# Patient Record
Sex: Female | Born: 1935 | Race: White | Hispanic: No
Health system: Midwestern US, Community
[De-identification: ages and names within clinical notes are randomized; demographics above are authoritative.]

## PROBLEM LIST (undated history)

## (undated) DIAGNOSIS — H9190 Unspecified hearing loss, unspecified ear: Secondary | ICD-10-CM

## (undated) DIAGNOSIS — N390 Urinary tract infection, site not specified: Secondary | ICD-10-CM

## (undated) DIAGNOSIS — I729 Aneurysm of unspecified site: Secondary | ICD-10-CM

## (undated) DIAGNOSIS — E785 Hyperlipidemia, unspecified: Secondary | ICD-10-CM

## (undated) DIAGNOSIS — M1990 Unspecified osteoarthritis, unspecified site: Secondary | ICD-10-CM

## (undated) DIAGNOSIS — N3090 Cystitis, unspecified without hematuria: Secondary | ICD-10-CM

## (undated) DIAGNOSIS — R4182 Altered mental status, unspecified: Secondary | ICD-10-CM

## (undated) HISTORY — DX: Unspecified hearing loss, unspecified ear: H91.90

## (undated) HISTORY — DX: Aneurysm of unspecified site: I72.9

## (undated) HISTORY — PX: ABDOMINAL HYSTERECTOMY: SHX81

---

## 1999-01-17 ENCOUNTER — Encounter: Payer: Self-pay | Admitting: *Deleted

## 1999-01-17 ENCOUNTER — Encounter: Admission: RE | Admit: 1999-01-17 | Discharge: 1999-01-17 | Payer: Self-pay | Admitting: *Deleted

## 1999-09-12 ENCOUNTER — Encounter: Admission: RE | Admit: 1999-09-12 | Discharge: 1999-12-11 | Payer: Self-pay | Admitting: *Deleted

## 2000-03-18 ENCOUNTER — Encounter: Payer: Self-pay | Admitting: *Deleted

## 2000-03-18 ENCOUNTER — Encounter: Admission: RE | Admit: 2000-03-18 | Discharge: 2000-03-18 | Payer: Self-pay | Admitting: *Deleted

## 2001-04-01 ENCOUNTER — Encounter: Admission: RE | Admit: 2001-04-01 | Discharge: 2001-04-01 | Payer: Self-pay | Admitting: *Deleted

## 2001-04-01 ENCOUNTER — Encounter: Payer: Self-pay | Admitting: *Deleted

## 2001-11-17 ENCOUNTER — Encounter: Payer: Self-pay | Admitting: Family Medicine

## 2001-11-17 ENCOUNTER — Encounter: Admission: RE | Admit: 2001-11-17 | Discharge: 2001-11-17 | Payer: Self-pay | Admitting: *Deleted

## 2002-07-27 ENCOUNTER — Other Ambulatory Visit: Admission: RE | Admit: 2002-07-27 | Discharge: 2002-07-27 | Payer: Self-pay | Admitting: Family Medicine

## 2002-08-04 ENCOUNTER — Encounter: Payer: Self-pay | Admitting: Family Medicine

## 2002-08-04 ENCOUNTER — Encounter: Admission: RE | Admit: 2002-08-04 | Discharge: 2002-08-04 | Payer: Self-pay | Admitting: Family Medicine

## 2004-03-17 ENCOUNTER — Other Ambulatory Visit: Admission: RE | Admit: 2004-03-17 | Discharge: 2004-03-17 | Payer: Self-pay | Admitting: Family Medicine

## 2004-04-11 ENCOUNTER — Encounter: Admission: RE | Admit: 2004-04-11 | Discharge: 2004-04-11 | Payer: Self-pay | Admitting: Family Medicine

## 2004-05-22 ENCOUNTER — Ambulatory Visit (HOSPITAL_COMMUNITY): Admission: RE | Admit: 2004-05-22 | Discharge: 2004-05-22 | Payer: Self-pay | Admitting: Gastroenterology

## 2005-03-28 ENCOUNTER — Other Ambulatory Visit: Admission: RE | Admit: 2005-03-28 | Discharge: 2005-03-28 | Payer: Self-pay | Admitting: Family Medicine

## 2005-05-24 ENCOUNTER — Encounter: Admission: RE | Admit: 2005-05-24 | Discharge: 2005-05-24 | Payer: Self-pay | Admitting: Family Medicine

## 2006-06-07 ENCOUNTER — Encounter: Admission: RE | Admit: 2006-06-07 | Discharge: 2006-06-07 | Payer: Self-pay | Admitting: Family Medicine

## 2007-06-13 ENCOUNTER — Encounter: Admission: RE | Admit: 2007-06-13 | Discharge: 2007-06-13 | Payer: Self-pay | Admitting: Family Medicine

## 2008-10-25 ENCOUNTER — Encounter: Admission: RE | Admit: 2008-10-25 | Discharge: 2008-10-25 | Payer: Self-pay | Admitting: Family Medicine

## 2009-12-01 ENCOUNTER — Encounter: Admission: RE | Admit: 2009-12-01 | Discharge: 2009-12-01 | Payer: Self-pay | Admitting: Family Medicine

## 2010-03-26 ENCOUNTER — Encounter: Payer: Self-pay | Admitting: Family Medicine

## 2010-07-04 ENCOUNTER — Ambulatory Visit
Admission: RE | Admit: 2010-07-04 | Discharge: 2010-07-04 | Disposition: A | Discharge status: Home / Self Care | Payer: Medicare Other | Source: Ambulatory Visit | Attending: Family Medicine | Admitting: Family Medicine

## 2010-07-04 ENCOUNTER — Other Ambulatory Visit: Payer: Self-pay | Admitting: Family Medicine

## 2010-07-04 DIAGNOSIS — R634 Abnormal weight loss: Secondary | ICD-10-CM

## 2010-07-04 DIAGNOSIS — Z87891 Personal history of nicotine dependence: Secondary | ICD-10-CM

## 2010-07-21 NOTE — Op Note (Signed)
NAME:  Sharon [name], Sharon [name] NO.:  [id number]   MEDICAL RECORD NO.:  [id number]          PATIENT TYPE:  AMB   LOCATION:  ENDO                         FACILITY:  [facility]   PHYSICIAN:  Petra Kuba, M.D.    DATE OF BIRTH:  [birth year]   DATE OF PROCEDURE:  05/22/2004  DATE OF DISCHARGE:                                 OPERATIVE REPORT   PROCEDURE:  Colonoscopy.   INDICATIONS:  Screening.  Consent was signed after risks, benefits, methods,  and options were thoroughly discussed in the office with the nurses.   PREMEDICATIONS:  Demerol 50 mg, Versed 5 mg.   DESCRIPTION OF PROCEDURE:  Rectal inspection pertinent for external  hemorrhoids, small.  Digital exam was negative.  Video pediatric adjustable  colonoscope was inserted and with abdominal pressure able to be advanced  around the colon to the cecum.  No abnormalities were seen on insertion.  The cecum was identified by the appendiceal orifice and the ileocecal valve.  The scope was slowly withdrawn.  Prep was adequate.  There was some liquid  stool that required washing and suctioning.  On slow withdrawal through the  colon, no abnormalities were seen, specifically no polyps, tumors, masses,  or diverticula.  Once back in the rectum, anorectal pull-through and  retroflexion confirmed some small hemorrhoids.  The scope was straightened  and readvanced a short ways up the left side of the colon.  Air was  suctioned and the scope removed.  The patient tolerated the procedure fair  although it seemed to be more reaction to the medicine than to the procedure  itself.  There were no obvious immediate complications.   ENDOSCOPIC DIAGNOSES:  1.  Internal and external hemorrhoids.  2.  Tortuous sigmoid not mentioned above.  3.  Otherwise within normal limits to the cecum.   PLAN:  Yearly rectals and guaiacs per Dr. Valentina Lucks.  Happy to see back p.r.n.  Consider repeat screening in five to 10 years and would consider either  Diprivan or a virtual colonoscopy at that junction.      MEM/MEDQ  D:  05/22/2004  T:  05/22/2004  Job:  [id number]   cc:   Gretta Arab. Valentina Lucks, M.D.  [address] [place]  [place] [address]  Fax: [phone]

## 2010-07-28 ENCOUNTER — Ambulatory Visit: Payer: Self-pay | Admitting: Internal Medicine

## 2010-08-03 ENCOUNTER — Ambulatory Visit: Payer: Self-pay | Admitting: Internal Medicine

## 2010-08-09 ENCOUNTER — Ambulatory Visit: Payer: Medicare Other | Admitting: Internal Medicine

## 2010-08-11 ENCOUNTER — Ambulatory Visit: Payer: Medicare Other | Admitting: Internal Medicine

## 2011-05-28 DIAGNOSIS — M1710 Unilateral primary osteoarthritis, unspecified knee: Secondary | ICD-10-CM | POA: Diagnosis not present

## 2011-06-27 DIAGNOSIS — F411 Generalized anxiety disorder: Secondary | ICD-10-CM | POA: Diagnosis not present

## 2011-06-27 DIAGNOSIS — E7800 Pure hypercholesterolemia, unspecified: Secondary | ICD-10-CM | POA: Diagnosis not present

## 2011-07-23 DIAGNOSIS — Z961 Presence of intraocular lens: Secondary | ICD-10-CM | POA: Diagnosis not present

## 2011-08-02 DIAGNOSIS — M810 Age-related osteoporosis without current pathological fracture: Secondary | ICD-10-CM | POA: Diagnosis not present

## 2011-10-17 DIAGNOSIS — H6120 Impacted cerumen, unspecified ear: Secondary | ICD-10-CM | POA: Diagnosis not present

## 2011-10-19 DIAGNOSIS — H6120 Impacted cerumen, unspecified ear: Secondary | ICD-10-CM | POA: Diagnosis not present

## 2011-11-14 DIAGNOSIS — M25569 Pain in unspecified knee: Secondary | ICD-10-CM | POA: Diagnosis not present

## 2011-11-18 DIAGNOSIS — Z23 Encounter for immunization: Secondary | ICD-10-CM | POA: Diagnosis not present

## 2011-11-22 DIAGNOSIS — M79609 Pain in unspecified limb: Secondary | ICD-10-CM | POA: Diagnosis not present

## 2011-12-14 DIAGNOSIS — Q7290 Unspecified reduction defect of unspecified lower limb: Secondary | ICD-10-CM | POA: Diagnosis not present

## 2011-12-24 DIAGNOSIS — M1710 Unilateral primary osteoarthritis, unspecified knee: Secondary | ICD-10-CM | POA: Diagnosis not present

## 2012-01-02 DIAGNOSIS — M1710 Unilateral primary osteoarthritis, unspecified knee: Secondary | ICD-10-CM | POA: Diagnosis not present

## 2012-01-07 DIAGNOSIS — H903 Sensorineural hearing loss, bilateral: Secondary | ICD-10-CM | POA: Diagnosis not present

## 2012-01-09 DIAGNOSIS — M1710 Unilateral primary osteoarthritis, unspecified knee: Secondary | ICD-10-CM | POA: Diagnosis not present

## 2012-01-15 ENCOUNTER — Other Ambulatory Visit: Payer: Self-pay | Admitting: Family Medicine

## 2012-01-15 DIAGNOSIS — F411 Generalized anxiety disorder: Secondary | ICD-10-CM | POA: Diagnosis not present

## 2012-01-15 DIAGNOSIS — Z1231 Encounter for screening mammogram for malignant neoplasm of breast: Secondary | ICD-10-CM

## 2012-01-15 DIAGNOSIS — Z0000 Encounter for general adult medical examination without abnormal findings: Secondary | ICD-10-CM | POA: Diagnosis not present

## 2012-01-15 DIAGNOSIS — E7800 Pure hypercholesterolemia, unspecified: Secondary | ICD-10-CM | POA: Diagnosis not present

## 2012-01-15 DIAGNOSIS — M899 Disorder of bone, unspecified: Secondary | ICD-10-CM | POA: Diagnosis not present

## 2012-01-15 DIAGNOSIS — Z23 Encounter for immunization: Secondary | ICD-10-CM | POA: Diagnosis not present

## 2012-01-30 DIAGNOSIS — M810 Age-related osteoporosis without current pathological fracture: Secondary | ICD-10-CM | POA: Diagnosis not present

## 2012-03-06 ENCOUNTER — Ambulatory Visit
Admission: RE | Admit: 2012-03-06 | Discharge: 2012-03-06 | Disposition: A | Discharge status: Home / Self Care | Payer: Medicare Other | Source: Ambulatory Visit | Attending: Family Medicine | Admitting: Family Medicine

## 2012-03-06 DIAGNOSIS — Z1231 Encounter for screening mammogram for malignant neoplasm of breast: Secondary | ICD-10-CM

## 2012-03-21 DIAGNOSIS — M1710 Unilateral primary osteoarthritis, unspecified knee: Secondary | ICD-10-CM | POA: Diagnosis not present

## 2012-03-31 DIAGNOSIS — M949 Disorder of cartilage, unspecified: Secondary | ICD-10-CM | POA: Diagnosis not present

## 2012-07-08 DIAGNOSIS — E7800 Pure hypercholesterolemia, unspecified: Secondary | ICD-10-CM | POA: Diagnosis not present

## 2012-07-24 DIAGNOSIS — Z961 Presence of intraocular lens: Secondary | ICD-10-CM | POA: Diagnosis not present

## 2012-08-05 DIAGNOSIS — M899 Disorder of bone, unspecified: Secondary | ICD-10-CM | POA: Diagnosis not present

## 2012-08-05 DIAGNOSIS — M949 Disorder of cartilage, unspecified: Secondary | ICD-10-CM | POA: Diagnosis not present

## 2012-10-09 DIAGNOSIS — M47817 Spondylosis without myelopathy or radiculopathy, lumbosacral region: Secondary | ICD-10-CM | POA: Diagnosis not present

## 2012-10-09 DIAGNOSIS — M1710 Unilateral primary osteoarthritis, unspecified knee: Secondary | ICD-10-CM | POA: Diagnosis not present

## 2012-11-22 DIAGNOSIS — Z23 Encounter for immunization: Secondary | ICD-10-CM | POA: Diagnosis not present

## 2013-01-16 DIAGNOSIS — F411 Generalized anxiety disorder: Secondary | ICD-10-CM | POA: Diagnosis not present

## 2013-01-16 DIAGNOSIS — E7800 Pure hypercholesterolemia, unspecified: Secondary | ICD-10-CM | POA: Diagnosis not present

## 2013-01-16 DIAGNOSIS — M899 Disorder of bone, unspecified: Secondary | ICD-10-CM | POA: Diagnosis not present

## 2013-01-16 DIAGNOSIS — Z23 Encounter for immunization: Secondary | ICD-10-CM | POA: Diagnosis not present

## 2013-01-16 DIAGNOSIS — Z0000 Encounter for general adult medical examination without abnormal findings: Secondary | ICD-10-CM | POA: Diagnosis not present

## 2013-01-28 DIAGNOSIS — L821 Other seborrheic keratosis: Secondary | ICD-10-CM | POA: Diagnosis not present

## 2013-01-28 DIAGNOSIS — L570 Actinic keratosis: Secondary | ICD-10-CM | POA: Diagnosis not present

## 2013-01-28 DIAGNOSIS — D692 Other nonthrombocytopenic purpura: Secondary | ICD-10-CM | POA: Diagnosis not present

## 2013-02-06 DIAGNOSIS — M899 Disorder of bone, unspecified: Secondary | ICD-10-CM | POA: Diagnosis not present

## 2013-03-11 ENCOUNTER — Other Ambulatory Visit: Payer: Self-pay

## 2013-03-11 DIAGNOSIS — Z1231 Encounter for screening mammogram for malignant neoplasm of breast: Secondary | ICD-10-CM

## 2013-04-01 ENCOUNTER — Ambulatory Visit: Payer: Medicare Other

## 2013-04-17 ENCOUNTER — Ambulatory Visit: Payer: Medicare Other

## 2013-04-20 ENCOUNTER — Ambulatory Visit
Admission: RE | Admit: 2013-04-20 | Discharge: 2013-04-20 | Disposition: A | Discharge status: Home / Self Care | Payer: PRIVATE HEALTH INSURANCE | Source: Ambulatory Visit

## 2013-04-20 DIAGNOSIS — Z1231 Encounter for screening mammogram for malignant neoplasm of breast: Secondary | ICD-10-CM | POA: Diagnosis not present

## 2013-08-10 DIAGNOSIS — M810 Age-related osteoporosis without current pathological fracture: Secondary | ICD-10-CM | POA: Diagnosis not present

## 2013-09-07 DIAGNOSIS — Z961 Presence of intraocular lens: Secondary | ICD-10-CM | POA: Diagnosis not present

## 2013-10-11 ENCOUNTER — Emergency Department (HOSPITAL_COMMUNITY)
Admission: EM | Admit: 2013-10-11 | Discharge: 2013-10-11 | Disposition: A | Discharge status: Home / Self Care | Payer: Medicare Other | Attending: Emergency Medicine | Admitting: Emergency Medicine

## 2013-10-11 ENCOUNTER — Encounter (HOSPITAL_COMMUNITY): Payer: Self-pay | Admitting: Emergency Medicine

## 2013-10-11 DIAGNOSIS — Z791 Long term (current) use of non-steroidal anti-inflammatories (NSAID): Secondary | ICD-10-CM | POA: Diagnosis not present

## 2013-10-11 DIAGNOSIS — F17200 Nicotine dependence, unspecified, uncomplicated: Secondary | ICD-10-CM | POA: Diagnosis not present

## 2013-10-11 DIAGNOSIS — M549 Dorsalgia, unspecified: Secondary | ICD-10-CM | POA: Diagnosis not present

## 2013-10-11 DIAGNOSIS — Z7982 Long term (current) use of aspirin: Secondary | ICD-10-CM | POA: Diagnosis not present

## 2013-10-11 DIAGNOSIS — M25559 Pain in unspecified hip: Secondary | ICD-10-CM | POA: Insufficient documentation

## 2013-10-11 DIAGNOSIS — L539 Erythematous condition, unspecified: Secondary | ICD-10-CM | POA: Diagnosis not present

## 2013-10-11 DIAGNOSIS — R112 Nausea with vomiting, unspecified: Secondary | ICD-10-CM | POA: Insufficient documentation

## 2013-10-11 DIAGNOSIS — M7918 Myalgia, other site: Secondary | ICD-10-CM

## 2013-10-11 DIAGNOSIS — Z79899 Other long term (current) drug therapy: Secondary | ICD-10-CM | POA: Insufficient documentation

## 2013-10-11 LAB — I-STAT CHEM 8, ED
BUN: 13 mg/dL (ref 6–23)
Calcium, Ion: 1.16 mmol/L (ref 1.13–1.30)
Chloride: 103 mEq/L (ref 96–112)
Creatinine, Ser: 0.8 mg/dL (ref 0.50–1.10)
Glucose, Bld: 102 mg/dL — ABNORMAL HIGH (ref 70–99)
HCT: 44 % (ref 36.0–46.0)
HEMOGLOBIN: 15 g/dL (ref 12.0–15.0)
POTASSIUM: 4.4 meq/L (ref 3.7–5.3)
SODIUM: 136 meq/L — AB (ref 137–147)
TCO2: 26 mmol/L (ref 0–100)

## 2013-10-11 MED ORDER — OXYCODONE-ACETAMINOPHEN 5-325 MG PO TABS
1.0000 | ORAL_TABLET | Freq: Once | ORAL | Status: AC
  Administered 2013-10-11: 1 via ORAL
  Filled 2013-10-11: qty 1

## 2013-10-11 MED ORDER — ONDANSETRON 4 MG PO TBDP
4.0000 mg | ORAL_TABLET | Freq: Three times a day (TID) | ORAL | Status: DC | PRN
Start: 2013-10-11 — End: 2014-08-16

## 2013-10-11 MED ORDER — ONDANSETRON 4 MG PO TBDP
8.0000 mg | ORAL_TABLET | Freq: Once | ORAL | Status: AC
  Administered 2013-10-11: 8 mg via ORAL
  Filled 2013-10-11: qty 2

## 2013-10-11 MED ORDER — ONDANSETRON HCL 4 MG/2ML IJ SOLN: 4.0000 mg | Freq: Once | INTRAMUSCULAR | Status: DC

## 2013-10-11 NOTE — ED Notes (Signed)
Pt with acute onset R buttock pain at 7 am.  Pt took aleve and became nauseated.  Pain continued, so she took an ibuprofen about 5 hours later and vomited, so she took another one and kept it down.  Pt does have non-raised circular red mark to R buttock and appears quite uncomfortable.

## 2013-10-11 NOTE — ED Provider Notes (Signed)
CSN: [id number]     Arrival date & time 10/11/13  1424 History   First MD Initiated Contact with Patient 10/11/13 1604     Chief Complaint  Patient presents with  . Nausea  . Back Pain   RAILEIGH SABATER is a 79 yo caucasian F w/PMH of arthritis who presents today w/severe right hip pain. Pt awoke this morning around 7AM w/pain in her right buttox and noticed a red area there. Pain is sharp and throbbing. 10/10 in severity. Minor radiation to right leg/right upper abd. Never had this before. Denies any recent bug bites or scratches. Pt also felt nauseous today after taking aleve this morning and again this afternoon after taking motrin.   She denies CP, SOB, fever, chills, abd pain, constipation, hematemesis, dysuria, hematuria, sick contacts, or recent travel.   (Consider location/radiation/quality/duration/timing/severity/associated sxs/prior   Treatment) Patient is a 79 y.o. female presenting with back pain.  Back Pain Pain location: right buttox/hip. Quality:  Aching Radiates to: right upper leg, right upper abd. Pain severity:  Severe Onset quality:  Sudden Duration:  1 day Timing:  Constant Progression:  Resolved Chronicity:  New Context: not falling, not recent illness, not recent injury and not twisting   Relieved by:  Nothing Exacerbated by: touching the area. Associated symptoms: no abdominal pain, no chest pain, no dysuria, no fever, no headaches and no pelvic pain     History reviewed. No pertinent past medical history. Past Surgical History  Procedure Laterality Date  . Abdominal hysterectomy     No family history on file. History  Substance Use Topics  . Smoking status: Current Every Day Smoker -- 0.50 packs/day    Types: Cigarettes  . Smokeless tobacco: Not on file  . Alcohol Use: No     Comment: recovering alcoholic   OB History   Grav Para Term Preterm Abortions TAB SAB Ect Mult Living                 Review of Systems  Constitutional: Negative  for fever and chills.  Respiratory: Negative for shortness of breath.   Cardiovascular: Negative for chest pain, palpitations and leg swelling.  Gastrointestinal: Positive for nausea and vomiting. Negative for abdominal pain, diarrhea, constipation and abdominal distention.  Genitourinary: Negative for dysuria, frequency, hematuria, flank pain, decreased urine volume and pelvic pain.  Musculoskeletal: Negative for back pain, joint swelling and neck stiffness.  Skin: Positive for color change (redness over right buttox). Negative for wound.  Neurological: Negative for dizziness, speech difficulty, light-headedness and headaches.  All other systems reviewed and are negative.     Allergies  Review of patient's allergies indicates no known allergies.  Home Medications   Prior to Admission medications   Medication Sig Start Date End Date Taking? Authorizing Provider  aspirin EC 81 MG tablet Take 81 mg by mouth daily.   Yes Historical Provider, MD  ibuprofen (ADVIL,MOTRIN) 200 MG tablet Take 200 mg by mouth every 6 (six) hours as needed.   Yes Historical Provider, MD  naproxen sodium (ANAPROX) 220 MG tablet Take 220 mg by mouth 2 (two) times daily with a meal.   Yes Historical Provider, MD  Omega-3 Fatty Acids (FISH OIL PO) Take 1 tablet by mouth daily.   Yes Historical Provider, MD  Probiotic Product (HEALTHY COLON PO) Take 1 tablet by mouth daily.   Yes Historical Provider, MD  simvastatin (ZOCOR) 40 MG tablet Take 40 mg by mouth daily.   Yes Historical Provider, MD  vitamin E 100 UNIT capsule Take 100 Units by mouth daily.   Yes Historical Provider, MD  ondansetron (ZOFRAN ODT) 4 MG disintegrating tablet Take 1 tablet (4 mg total) by mouth every 8 (eight) hours as needed for nausea or vomiting. 10/11/13   Sherian Maroon, MD   BP 137/118  Pulse 65  Temp(Src) 98 F (36.7 C) (Oral)  Resp 18  Ht 5\' 3"  (1.6 m)  Wt 127 lb (57.607 kg)  BMI 22.50 kg/m2  SpO2 97% Physical Exam  Nursing note and  vitals reviewed. Constitutional: She is oriented to person, place, and time. She appears well-developed and well-nourished. No distress.  HENT:  Head: Normocephalic and atraumatic.  Cardiovascular: Normal rate, regular rhythm, normal heart sounds and intact distal pulses.  Exam reveals no gallop and no friction rub.   No murmur heard. Pulmonary/Chest: Effort normal and breath sounds normal. No respiratory distress. She has no wheezes. She has no rales. She exhibits no tenderness.  Abdominal: Soft. Bowel sounds are normal. She exhibits no distension and no mass. There is no tenderness. There is no rebound and no guarding.  Lymphadenopathy:    She has no cervical adenopathy.  Neurological: She is alert and oriented to person, place, and time.  Skin: Skin is warm and dry. She is not diaphoretic. There is erythema (small wheels of erythema over right hip, nontender, not ithcing).    ED Course  Procedures (including critical care time) Labs Review Labs Reviewed  I-STAT CHEM 8, ED - Abnormal; Notable for the following:    Sodium 136 (*)    Glucose, Bld 102 (*)    All other components within normal limits    Imaging Review No results found.   EKG Interpretation None      MDM   79 year old Caucasian female here with right buttocks pain. Please see history of present illness for details. On exam patient in NAD, AF VSS. After Percocet given in triage patient's pain is come down to 0 from a 10/10. She indicates that there was redness and tenderness over her right buttox. On my exam there is no redness over her buttox. There are 2 small red marks on her right lateral hip. These are nonpainful, and nonfluctuant, do not appear infectious. No obvious bite mark. Possible allergic reaction to something? No sign of cellulitis or abscess. LEs w/full strength, sensation, and ROM. No tenderness over spine or SI joint.   Patient has no abdominal tenderness on exam. Nausea completely resolved with  Zofran. No urinary sx. Possibly medication induced nausea. Will check Chem8.   Electrolytes wnl. Pt asymptomatic here. DC home w/strict return precautions including severe pain, return of redness, especially red streaking, PO intolerance and severe N/V, or abd pain. Follow up to PCP as needed.   Final diagnoses:  Right buttock pain    Pt was seen under the supervision of Dr. Aline Brochure.     Sherian Maroon, MD 10/11/13 [phone]

## 2013-10-11 NOTE — Discharge Instructions (Signed)
Take motrin for pain 400-600mg  every 8 hours as needed. Do not take on empty stomach. If redness and pain worsen, seek medical care.

## 2013-10-11 NOTE — ED Provider Notes (Signed)
Medical screening examination/treatment/procedure(s) were conducted as a shared visit with resident physician and myself.  I personally evaluated the patient during the encounter.  I interviewed and examined the patient. Lungs are CTAB. Cardiac exam wnl. Abdomen soft.  A few small urticarial lesions over the right hip. Does not appear cellulitic. I suspect her pain is msk in nature. istat chem 8 non-contrib. Pt otherwise well appearing and pain controlled.   Pamella Pert, MD 10/11/13 2330

## 2013-10-20 DIAGNOSIS — K5900 Constipation, unspecified: Secondary | ICD-10-CM | POA: Diagnosis not present

## 2013-10-20 DIAGNOSIS — M5430 Sciatica, unspecified side: Secondary | ICD-10-CM | POA: Diagnosis not present

## 2013-10-20 DIAGNOSIS — B009 Herpesviral infection, unspecified: Secondary | ICD-10-CM | POA: Diagnosis not present

## 2013-10-30 DIAGNOSIS — M5137 Other intervertebral disc degeneration, lumbosacral region: Secondary | ICD-10-CM | POA: Diagnosis not present

## 2013-10-30 DIAGNOSIS — M999 Biomechanical lesion, unspecified: Secondary | ICD-10-CM | POA: Diagnosis not present

## 2013-10-30 DIAGNOSIS — M5430 Sciatica, unspecified side: Secondary | ICD-10-CM | POA: Diagnosis not present

## 2013-11-02 DIAGNOSIS — M999 Biomechanical lesion, unspecified: Secondary | ICD-10-CM | POA: Diagnosis not present

## 2013-11-02 DIAGNOSIS — M5137 Other intervertebral disc degeneration, lumbosacral region: Secondary | ICD-10-CM | POA: Diagnosis not present

## 2013-11-02 DIAGNOSIS — M5430 Sciatica, unspecified side: Secondary | ICD-10-CM | POA: Diagnosis not present

## 2013-11-04 DIAGNOSIS — M5137 Other intervertebral disc degeneration, lumbosacral region: Secondary | ICD-10-CM | POA: Diagnosis not present

## 2013-11-04 DIAGNOSIS — M999 Biomechanical lesion, unspecified: Secondary | ICD-10-CM | POA: Diagnosis not present

## 2013-11-04 DIAGNOSIS — M5430 Sciatica, unspecified side: Secondary | ICD-10-CM | POA: Diagnosis not present

## 2013-11-05 DIAGNOSIS — M5137 Other intervertebral disc degeneration, lumbosacral region: Secondary | ICD-10-CM | POA: Diagnosis not present

## 2013-11-05 DIAGNOSIS — M5430 Sciatica, unspecified side: Secondary | ICD-10-CM | POA: Diagnosis not present

## 2013-11-05 DIAGNOSIS — M999 Biomechanical lesion, unspecified: Secondary | ICD-10-CM | POA: Diagnosis not present

## 2013-11-11 DIAGNOSIS — M5137 Other intervertebral disc degeneration, lumbosacral region: Secondary | ICD-10-CM | POA: Diagnosis not present

## 2013-11-11 DIAGNOSIS — M999 Biomechanical lesion, unspecified: Secondary | ICD-10-CM | POA: Diagnosis not present

## 2013-11-11 DIAGNOSIS — M5430 Sciatica, unspecified side: Secondary | ICD-10-CM | POA: Diagnosis not present

## 2013-11-13 DIAGNOSIS — M5137 Other intervertebral disc degeneration, lumbosacral region: Secondary | ICD-10-CM | POA: Diagnosis not present

## 2013-11-13 DIAGNOSIS — M5430 Sciatica, unspecified side: Secondary | ICD-10-CM | POA: Diagnosis not present

## 2013-11-13 DIAGNOSIS — M999 Biomechanical lesion, unspecified: Secondary | ICD-10-CM | POA: Diagnosis not present

## 2013-11-16 DIAGNOSIS — M5430 Sciatica, unspecified side: Secondary | ICD-10-CM | POA: Diagnosis not present

## 2013-11-16 DIAGNOSIS — M999 Biomechanical lesion, unspecified: Secondary | ICD-10-CM | POA: Diagnosis not present

## 2013-11-16 DIAGNOSIS — M5137 Other intervertebral disc degeneration, lumbosacral region: Secondary | ICD-10-CM | POA: Diagnosis not present

## 2013-11-18 DIAGNOSIS — M5430 Sciatica, unspecified side: Secondary | ICD-10-CM | POA: Diagnosis not present

## 2013-11-18 DIAGNOSIS — M999 Biomechanical lesion, unspecified: Secondary | ICD-10-CM | POA: Diagnosis not present

## 2013-11-18 DIAGNOSIS — M5137 Other intervertebral disc degeneration, lumbosacral region: Secondary | ICD-10-CM | POA: Diagnosis not present

## 2013-11-20 DIAGNOSIS — M5430 Sciatica, unspecified side: Secondary | ICD-10-CM | POA: Diagnosis not present

## 2013-11-20 DIAGNOSIS — M5137 Other intervertebral disc degeneration, lumbosacral region: Secondary | ICD-10-CM | POA: Diagnosis not present

## 2013-11-20 DIAGNOSIS — M999 Biomechanical lesion, unspecified: Secondary | ICD-10-CM | POA: Diagnosis not present

## 2013-11-23 DIAGNOSIS — M999 Biomechanical lesion, unspecified: Secondary | ICD-10-CM | POA: Diagnosis not present

## 2013-11-23 DIAGNOSIS — M5430 Sciatica, unspecified side: Secondary | ICD-10-CM | POA: Diagnosis not present

## 2013-11-23 DIAGNOSIS — M5137 Other intervertebral disc degeneration, lumbosacral region: Secondary | ICD-10-CM | POA: Diagnosis not present

## 2013-11-25 DIAGNOSIS — M999 Biomechanical lesion, unspecified: Secondary | ICD-10-CM | POA: Diagnosis not present

## 2013-11-25 DIAGNOSIS — M5430 Sciatica, unspecified side: Secondary | ICD-10-CM | POA: Diagnosis not present

## 2013-11-25 DIAGNOSIS — M5137 Other intervertebral disc degeneration, lumbosacral region: Secondary | ICD-10-CM | POA: Diagnosis not present

## 2013-11-26 DIAGNOSIS — M5430 Sciatica, unspecified side: Secondary | ICD-10-CM | POA: Diagnosis not present

## 2013-11-26 DIAGNOSIS — M999 Biomechanical lesion, unspecified: Secondary | ICD-10-CM | POA: Diagnosis not present

## 2013-11-26 DIAGNOSIS — M5137 Other intervertebral disc degeneration, lumbosacral region: Secondary | ICD-10-CM | POA: Diagnosis not present

## 2013-11-30 DIAGNOSIS — M999 Biomechanical lesion, unspecified: Secondary | ICD-10-CM | POA: Diagnosis not present

## 2013-11-30 DIAGNOSIS — M5430 Sciatica, unspecified side: Secondary | ICD-10-CM | POA: Diagnosis not present

## 2013-11-30 DIAGNOSIS — M5137 Other intervertebral disc degeneration, lumbosacral region: Secondary | ICD-10-CM | POA: Diagnosis not present

## 2013-12-02 DIAGNOSIS — Z23 Encounter for immunization: Secondary | ICD-10-CM | POA: Diagnosis not present

## 2014-02-10 DIAGNOSIS — M810 Age-related osteoporosis without current pathological fracture: Secondary | ICD-10-CM | POA: Diagnosis not present

## 2014-02-16 DIAGNOSIS — L570 Actinic keratosis: Secondary | ICD-10-CM | POA: Diagnosis not present

## 2014-02-16 DIAGNOSIS — L821 Other seborrheic keratosis: Secondary | ICD-10-CM | POA: Diagnosis not present

## 2014-02-16 DIAGNOSIS — L738 Other specified follicular disorders: Secondary | ICD-10-CM | POA: Diagnosis not present

## 2014-03-01 DIAGNOSIS — R51 Headache: Secondary | ICD-10-CM | POA: Diagnosis not present

## 2014-03-01 DIAGNOSIS — R400 Somnolence: Secondary | ICD-10-CM | POA: Diagnosis not present

## 2014-03-02 ENCOUNTER — Emergency Department (HOSPITAL_COMMUNITY): Payer: Medicare Other

## 2014-03-02 ENCOUNTER — Observation Stay (HOSPITAL_COMMUNITY): Payer: Medicare Other

## 2014-03-02 ENCOUNTER — Encounter (HOSPITAL_COMMUNITY): Payer: Self-pay | Admitting: Emergency Medicine

## 2014-03-02 ENCOUNTER — Inpatient Hospital Stay (HOSPITAL_COMMUNITY)
Admission: EM | Admit: 2014-03-02 | Discharge: 2014-03-18 | DRG: 020 | Disposition: A | Discharge status: Rehabilitation Facility | Payer: Medicare Other | Attending: Internal Medicine | Admitting: Internal Medicine

## 2014-03-02 DIAGNOSIS — N3090 Cystitis, unspecified without hematuria: Secondary | ICD-10-CM | POA: Insufficient documentation

## 2014-03-02 DIAGNOSIS — J9601 Acute respiratory failure with hypoxia: Secondary | ICD-10-CM

## 2014-03-02 DIAGNOSIS — I671 Cerebral aneurysm, nonruptured: Secondary | ICD-10-CM | POA: Insufficient documentation

## 2014-03-02 DIAGNOSIS — R059 Cough, unspecified: Secondary | ICD-10-CM

## 2014-03-02 DIAGNOSIS — R4701 Aphasia: Secondary | ICD-10-CM | POA: Diagnosis present

## 2014-03-02 DIAGNOSIS — Z7982 Long term (current) use of aspirin: Secondary | ICD-10-CM

## 2014-03-02 DIAGNOSIS — Z791 Long term (current) use of non-steroidal anti-inflammatories (NSAID): Secondary | ICD-10-CM

## 2014-03-02 DIAGNOSIS — R05 Cough: Secondary | ICD-10-CM

## 2014-03-02 DIAGNOSIS — E876 Hypokalemia: Secondary | ICD-10-CM | POA: Diagnosis not present

## 2014-03-02 DIAGNOSIS — R1310 Dysphagia, unspecified: Secondary | ICD-10-CM | POA: Diagnosis present

## 2014-03-02 DIAGNOSIS — N390 Urinary tract infection, site not specified: Secondary | ICD-10-CM | POA: Diagnosis present

## 2014-03-02 DIAGNOSIS — R51 Headache: Secondary | ICD-10-CM

## 2014-03-02 DIAGNOSIS — R410 Disorientation, unspecified: Secondary | ICD-10-CM | POA: Diagnosis not present

## 2014-03-02 DIAGNOSIS — E785 Hyperlipidemia, unspecified: Secondary | ICD-10-CM | POA: Diagnosis present

## 2014-03-02 DIAGNOSIS — I878 Other specified disorders of veins: Secondary | ICD-10-CM

## 2014-03-02 DIAGNOSIS — I729 Aneurysm of unspecified site: Secondary | ICD-10-CM

## 2014-03-02 DIAGNOSIS — R4182 Altered mental status, unspecified: Secondary | ICD-10-CM | POA: Diagnosis not present

## 2014-03-02 DIAGNOSIS — G441 Vascular headache, not elsewhere classified: Secondary | ICD-10-CM

## 2014-03-02 DIAGNOSIS — G9340 Encephalopathy, unspecified: Secondary | ICD-10-CM

## 2014-03-02 DIAGNOSIS — Z4659 Encounter for fitting and adjustment of other gastrointestinal appliance and device: Secondary | ICD-10-CM

## 2014-03-02 DIAGNOSIS — I608 Other nontraumatic subarachnoid hemorrhage: Secondary | ICD-10-CM

## 2014-03-02 DIAGNOSIS — R451 Restlessness and agitation: Secondary | ICD-10-CM | POA: Diagnosis not present

## 2014-03-02 DIAGNOSIS — R519 Headache, unspecified: Secondary | ICD-10-CM | POA: Diagnosis present

## 2014-03-02 DIAGNOSIS — J9600 Acute respiratory failure, unspecified whether with hypoxia or hypercapnia: Secondary | ICD-10-CM | POA: Diagnosis present

## 2014-03-02 DIAGNOSIS — G919 Hydrocephalus, unspecified: Secondary | ICD-10-CM

## 2014-03-02 DIAGNOSIS — G910 Communicating hydrocephalus: Secondary | ICD-10-CM | POA: Diagnosis not present

## 2014-03-02 DIAGNOSIS — G039 Meningitis, unspecified: Secondary | ICD-10-CM | POA: Diagnosis present

## 2014-03-02 DIAGNOSIS — I6031 Nontraumatic subarachnoid hemorrhage from right posterior communicating artery: Principal | ICD-10-CM | POA: Diagnosis present

## 2014-03-02 DIAGNOSIS — J95821 Acute postprocedural respiratory failure: Secondary | ICD-10-CM | POA: Diagnosis not present

## 2014-03-02 DIAGNOSIS — N3000 Acute cystitis without hematuria: Secondary | ICD-10-CM | POA: Diagnosis not present

## 2014-03-02 DIAGNOSIS — Z0189 Encounter for other specified special examinations: Secondary | ICD-10-CM

## 2014-03-02 DIAGNOSIS — I609 Nontraumatic subarachnoid hemorrhage, unspecified: Secondary | ICD-10-CM

## 2014-03-02 DIAGNOSIS — F17210 Nicotine dependence, cigarettes, uncomplicated: Secondary | ICD-10-CM | POA: Diagnosis present

## 2014-03-02 HISTORY — DX: Urinary tract infection, site not specified: N39.0

## 2014-03-02 HISTORY — DX: Cystitis, unspecified without hematuria: N30.90

## 2014-03-02 HISTORY — DX: Unspecified osteoarthritis, unspecified site: M19.90

## 2014-03-02 HISTORY — DX: Hyperlipidemia, unspecified: E78.5

## 2014-03-02 LAB — URINALYSIS, ROUTINE W REFLEX MICROSCOPIC
Bilirubin Urine: NEGATIVE
Glucose, UA: 500 mg/dL — AB
Hgb urine dipstick: NEGATIVE
KETONES UR: 15 mg/dL — AB
NITRITE: NEGATIVE
PH: 6 (ref 5.0–8.0)
Protein, ur: NEGATIVE mg/dL
SPECIFIC GRAVITY, URINE: 1.019 (ref 1.005–1.030)
Urobilinogen, UA: 1 mg/dL (ref 0.0–1.0)

## 2014-03-02 LAB — COMPREHENSIVE METABOLIC PANEL
ALBUMIN: 4 g/dL (ref 3.5–5.2)
ALT: 12 U/L (ref 0–35)
AST: 21 U/L (ref 0–37)
Alkaline Phosphatase: 51 U/L (ref 39–117)
Anion gap: 9 (ref 5–15)
BUN: 17 mg/dL (ref 6–23)
CALCIUM: 9.2 mg/dL (ref 8.4–10.5)
CO2: 26 mmol/L (ref 19–32)
Chloride: 100 mEq/L (ref 96–112)
Creatinine, Ser: 0.75 mg/dL (ref 0.50–1.10)
GFR calc Af Amer: >90 mL/min (ref >90)
GFR calc non Af Amer: 80 mL/min — ABNORMAL LOW (ref >90)
Glucose, Bld: 100 mg/dL — ABNORMAL HIGH (ref 70–99)
Potassium: 4 mmol/L (ref 3.5–5.1)
SODIUM: 135 mmol/L (ref 135–145)
TOTAL PROTEIN: 6.8 g/dL (ref 6.0–8.3)
Total Bilirubin: 0.7 mg/dL (ref 0.3–1.2)

## 2014-03-02 LAB — CBC WITH DIFFERENTIAL/PLATELET
BASOS PCT: 0 % (ref 0–1)
Basophils Absolute: 0 10*3/uL (ref 0.0–0.1)
Eosinophils Absolute: 0.1 10*3/uL (ref 0.0–0.7)
Eosinophils Relative: 1 % (ref 0–5)
HCT: 38.1 % (ref 36.0–46.0)
HEMOGLOBIN: 12.7 g/dL (ref 12.0–15.0)
Lymphocytes Relative: 15 % (ref 12–46)
Lymphs Abs: 1.4 10*3/uL (ref 0.7–4.0)
MCH: 31.6 pg (ref 26.0–34.0)
MCHC: 33.3 g/dL (ref 30.0–36.0)
MCV: 94.8 fL (ref 78.0–100.0)
MONOS PCT: 11 % (ref 3–12)
Monocytes Absolute: 1 10*3/uL (ref 0.1–1.0)
NEUTROS ABS: 6.5 10*3/uL (ref 1.7–7.7)
NEUTROS PCT: 73 % (ref 43–77)
Platelets: 275 10*3/uL (ref 150–400)
RBC: 4.02 MIL/uL (ref 3.87–5.11)
RDW: 12.6 % (ref 11.5–15.5)
WBC: 9 10*3/uL (ref 4.0–10.5)

## 2014-03-02 LAB — TSH: TSH: 0.79 u[IU]/mL (ref 0.350–4.500)

## 2014-03-02 LAB — CBG MONITORING, ED: GLUCOSE-CAPILLARY: 111 mg/dL — AB (ref 70–99)

## 2014-03-02 LAB — URINE MICROSCOPIC-ADD ON

## 2014-03-02 LAB — I-STAT CG4 LACTIC ACID, ED: Lactic Acid, Venous: 0.67 mmol/L (ref 0.5–2.2)

## 2014-03-02 LAB — PROTIME-INR
INR: 1.01 (ref 0.00–1.49)
Prothrombin Time: 13.4 seconds (ref 11.6–15.2)

## 2014-03-02 MED ORDER — CEFTRIAXONE SODIUM IN DEXTROSE 20 MG/ML IV SOLN
1.0000 g | INTRAVENOUS | Status: DC
  Filled 2014-03-02: qty 50

## 2014-03-02 MED ORDER — SODIUM CHLORIDE 0.9 % IV SOLN
INTRAVENOUS | Status: DC
  Administered 2014-03-02: 16:00:00 via INTRAVENOUS

## 2014-03-02 MED ORDER — ENOXAPARIN SODIUM 40 MG/0.4ML ~~LOC~~ SOLN
40.0000 mg | SUBCUTANEOUS | Status: DC
  Administered 2014-03-03: 40 mg via SUBCUTANEOUS
  Filled 2014-03-02 (×3): qty 0.4

## 2014-03-02 MED ORDER — DEXTROSE 5 % IV SOLN
1.0000 g | Freq: Once | INTRAVENOUS | Status: AC
  Administered 2014-03-02: 1 g via INTRAVENOUS
  Filled 2014-03-02: qty 10

## 2014-03-02 MED ORDER — ACETAMINOPHEN 325 MG PO TABS: 650.0000 mg | ORAL_TABLET | Freq: Four times a day (QID) | ORAL | Status: DC | PRN

## 2014-03-02 MED ORDER — ASPIRIN EC 81 MG PO TBEC
81.0000 mg | DELAYED_RELEASE_TABLET | Freq: Every day | ORAL | Status: DC
  Administered 2014-03-03: 81 mg via ORAL
  Filled 2014-03-02 (×2): qty 1

## 2014-03-02 MED ORDER — LORAZEPAM 2 MG/ML IJ SOLN
INTRAMUSCULAR | Status: AC
  Filled 2014-03-02: qty 1

## 2014-03-02 MED ORDER — SIMVASTATIN 20 MG PO TABS
20.0000 mg | ORAL_TABLET | Freq: Every day | ORAL | Status: DC
  Administered 2014-03-02 – 2014-03-17 (×12): 20 mg via ORAL
  Filled 2014-03-02 (×17): qty 1

## 2014-03-02 MED ORDER — ACETAMINOPHEN 650 MG RE SUPP
650.0000 mg | Freq: Four times a day (QID) | RECTAL | Status: DC | PRN
  Administered 2014-03-02: 650 mg via RECTAL
  Filled 2014-03-02: qty 1

## 2014-03-02 MED ORDER — LORAZEPAM 2 MG/ML IJ SOLN
1.0000 mg | Freq: Once | INTRAMUSCULAR | Status: AC
  Administered 2014-03-02 – 2014-03-03 (×2): 1 mg via INTRAVENOUS

## 2014-03-02 NOTE — H&P (Signed)
History and Physical  Sharon [name] XBJ:[id number] DOB: 1935-01-01 DOA: 03/02/2014  Referring physician: Dalia [name], ED PA PCP: Sharon Casco, MD  Outpatient Specialists:  1. Not known  Chief Complaint: Altered mental status  HPI: Sharon [name] is a 79 y.o. female with history of dyslipidemia, presented to the ED with ultra mental status. Patient is unable to provide any history secondary to altered mental status. History was obtained by discussing with patient's boyfriend Sharon [name] via phone. Patient apparently experienced an episode of headache associated with weakness during her dinner party and of November. After coming home, patient's boyfriend called a neighbor who is an oncologist who evaluated patient and indicated that she did not have a stroke clinically. However since then, patient apparently has had intermittent headache and sleeps more than usual. She however was coherent until sometime yesterday when she was slightly disoriented. He took her to the PCPs office and some lab work were done but they were not given any diagnosis. Today when her grandson went to see her, she was more confused and hence was brought to the ED. In the ED, lab work is unremarkable. Chest x-ray is negative. CT head is without acute intracranial process but shows indeterminate punctate calcification within the medial aspect of the right middle cranial foci which could potentially represent a small meningioma. No reported fever, chills, cough, dyspnea, nausea, vomiting, dysuria or urinary frequency. Hospitalist admission was requested.   Review of Systems: All systems reviewed and apart from history of presenting illness, are negative.  History reviewed. No pertinent past medical history. Past Surgical History  Procedure Laterality Date  . Abdominal hysterectomy     Social History:  reports that she has been smoking Cigarettes.  She has been smoking about 0.50 packs per day. She  does not have any smokeless tobacco history on file. She reports that she does not drink alcohol or use illicit drugs.   No Known Allergies  History reviewed. No pertinent family history.  unable to obtain.  Prior to Admission medications   Medication Sig Start Date End Date Taking? Authorizing Provider  aspirin EC 81 MG tablet Take 81 mg by mouth daily.    Historical Provider, MD  ibuprofen (ADVIL,MOTRIN) 200 MG tablet Take 200 mg by mouth every 6 (six) hours as needed.    Historical Provider, MD  naproxen sodium (ANAPROX) 220 MG tablet Take 220 mg by mouth 2 (two) times daily with a meal.    Historical Provider, MD  Omega-3 Fatty Acids (FISH OIL PO) Take 1 tablet by mouth daily.    Historical Provider, MD  ondansetron (ZOFRAN ODT) 4 MG disintegrating tablet Take 1 tablet (4 mg total) by mouth every 8 (eight) hours as needed for nausea or vomiting. 10/11/13   Sherian Maroon, MD  Probiotic Product (HEALTHY COLON PO) Take 1 tablet by mouth daily.    Historical Provider, MD  RENOVA 0.02 % CREA Apply 1 application topically daily. 01/12/14   Historical Provider, MD  simvastatin (ZOCOR) 20 MG tablet Take 1 tablet by mouth daily. 02/07/14   Historical Provider, MD  vitamin E 100 UNIT capsule Take 100 Units by mouth daily.    Historical Provider, MD   Physical Exam: Filed Vitals:   03/02/14 1330 03/02/14 1345 03/02/14 1400 03/02/14 1415  BP: 152/66 150/61 146/62 151/82  Pulse: 78 75 74 80  Temp:      TempSrc:      Resp: 16 16 17 17   Height:  Weight:      SpO2: 98% 97% 99% 98%   temperature: 98.73F.   General exam: Moderately built and nourished pleasant elderly female patient, lying comfortably supine on the gurney in no obvious distress.  Head, eyes and ENT: Nontraumatic and normocephalic. Pupils equally reacting to light and accommodation. Oral mucosa slightly dry.  Neck: Supple. No JVD, carotid bruit or thyromegaly.  Lymphatics: No lymphadenopathy.  Respiratory system: Clear to  auscultation. No increased work of breathing.  Cardiovascular system: S1 and S2 heard, RRR. No JVD, murmurs, gallops, clicks or pedal edema.  Gastrointestinal system: Abdomen is nondistended, soft and nontender. Normal bowel sounds heard. No organomegaly or masses appreciated.  Central nervous system: Alert and oriented to self and partly to place. She initially said that she was in a circus but then stated that she was in a [facility]". She is not oriented to time or president. She does follow some instructions. No focal neurological deficits.  Extremities: Symmetric 5 x 5 power. Peripheral pulses symmetrically felt.   Skin: No rashes or acute findings.  Musculoskeletal system: Negative exam.  Psychiatry: Pleasant and cooperative.   Labs on Admission:  Basic Metabolic Panel:  Recent Labs Lab 03/02/14 1048  NA 135  K 4.0  CL 100  CO2 26  GLUCOSE 100*  BUN 17  CREATININE 0.75  CALCIUM 9.2   Liver Function Tests:  Recent Labs Lab 03/02/14 1048  AST 21  ALT 12  ALKPHOS 51  BILITOT 0.7  PROT 6.8  ALBUMIN 4.0   No results for input(s): LIPASE, AMYLASE in the last 168 hours. No results for input(s): AMMONIA in the last 168 hours. CBC:  Recent Labs Lab 03/02/14 1048  WBC 9.0  NEUTROABS 6.5  HGB 12.7  HCT 38.1  MCV 94.8  PLT 275   Cardiac Enzymes: No results for input(s): CKTOTAL, CKMB, CKMBINDEX, TROPONINI in the last 168 hours.  BNP (last 3 results) No results for input(s): PROBNP in the last 8760 hours. CBG:  Recent Labs Lab 03/02/14 1042  GLUCAP 111*    Radiological Exams on Admission: Dg Chest 2 View  03/02/2014   CLINICAL DATA:  Cough; confusion  EXAM: CHEST  2 VIEW  COMPARISON:  Jul 04, 2010  FINDINGS: There is no edema or consolidation. Heart size and pulmonary vascularity are normal. No adenopathy. There is lower thoracic and upper lumbar levoscoliosis. There is degenerative change in the thoracic spine.  IMPRESSION: No edema or  consolidation.   Electronically Signed   By: Lowella Grip M.D.   On: 03/02/2014 13:03   Ct Head Wo Contrast  03/02/2014   CLINICAL DATA:  Confusion, altered mental status, temporal headache.  EXAM: CT HEAD WITHOUT CONTRAST  TECHNIQUE: Contiguous axial images were obtained from the base of the skull through the vertex without intravenous contrast.  COMPARISON:  None.  FINDINGS: Mild centralized volume loss with commensurate ex vacuo dilatation of the ventricular system. Scattered periventricular hypodensities compatible with microvascular ischemic disease. No CT evidence acute large territory infarct. No intraparenchymal or extra-axial hemorrhage.  There is a punctate (approximately 0.7 cm) calcification within the medial aspect of the right middle cranial fossa (image 7, series 202) which is incompletely evaluated though potentially representative of a small meningioma. Otherwise, no intraparenchymal or extra-axial mass. Normal configuration of the ventricles and basilar cisterns. No midline shift. Intracranial atherosclerosis.  Limited visualization of the paranasal sinuses and mastoid air cells are normal. No air-fluid levels. Post bilateral cataract surgery. Regional soft tissues appear normal.  No displaced calvarial fracture.  IMPRESSION: 1. Mild centralized volume loss and microvascular ischemic disease without acute intracranial process. 2. Indeterminate punctate calcification within the medial aspect of the right middle cranial fossa nonspecific though potentially representing a small meningioma. Further evaluation could be performed with MRI as clinically indicated.   Electronically Signed   By: Sandi Mariscal M.D.   On: 03/02/2014 12:05    EKG:  no EKG seen in Epic.  Assessment/Plan Principal Problem:   Acute encephalopathy Active Problems:   Headache   UTI (lower urinary tract infection)   1. Acute encephalopathy: Could be secondary to UTI. Patient does not appear septic, toxic or  unstable. No obvious focal neurological deficits. CT negative. We'll treat underlying UTI and monitor. Will obtain MRI brain to further determine the abnormality seen on CT head especially given history of headache ongoing for the last month. Will check TSH. 2. UTI: Continue IV Rocephin that was started in the ED, pending urine culture results. 3. Headaches: Unclear etiology. CT head without acute findings. Follow MRI brain. 4. Dyslipidemia: Continue statins.     Code Status:  Full  Family Communication:  discussed with patient's boyfriend Sharon [name] Disposition Plan:  home when medically stable.   Time spent:  6 minutes  Kyro Joswick, MD, FACP, FHM. [facility] Pager [phone]  If 7PM-7AM, please contact night-coverage [email or web address] Password [email or web address] 03/02/2014, 3:12 PM

## 2014-03-02 NOTE — ED Notes (Signed)
Silas Sacramento, RN attempted to call report before leaving, floor unable to take report.

## 2014-03-02 NOTE — ED Notes (Signed)
Pt c/o increased confusion and AMS with trouble answering questions that normally could answer easily; pt alert to person and place but disoriented to time; per family noticed on Sunday; pt c/o temporal HA; per family pt got lost this am with is not normal

## 2014-03-02 NOTE — Progress Notes (Signed)
Sharon [name] [id number] Code Status: full   Admission Data: 03/02/2014 4:15 PM Attending Provider:  hongalgi QHU:[id number],[id number] Theda Sers, MD Consults/ Treatment Team:    [name] is a 79 y.o. female patient admitted from ED awake, alert - oriented  X 3 - no acute distress noted.  VSS - Blood pressure 148/67, pulse 80, temperature 99.8 F (37.7 C), temperature source Oral, resp. rate 12, height 5' 3.5" (1.613 m), weight 54.885 kg (121 lb), SpO2 97 %.    IV in place, occlusive dsg intact without redness.  Orientation to room, and floor completed with information packet given to patient/family.  Patient declined safety video at this time.  Admission INP armband ID verified with patient/family, and in place.   SR up x 2, fall assessment complete, with patient and family able to verbalize understanding of risk associated with falls, and verbalized understanding to call nsg before up out of bed.  Call light within reach, patient able to voice, and demonstrate understanding.  Skin, clean-dry- intact without evidence of bruising, or skin tears.   No evidence of skin break down noted on exam.     Will cont to eval and treat per MD orders.  Nikkolas Coomes, Helen Hashimoto, RN 03/02/2014 4:15 PM

## 2014-03-02 NOTE — ED Provider Notes (Signed)
CSN: [id number]     Arrival date & time 03/02/14  1025 History   First MD Initiated Contact with Patient 03/02/14 1102     Chief Complaint  Patient presents with  . Altered Mental Status  . Headache     (Consider location/radiation/quality/duration/timing/severity/associated sxs/prior Treatment) HPI Patient presents to the emergency department with increased confusion and altered mental status since Sunday.  The patient lives with a boyfriend who states that he thought she was very confused Sunday.  Her grandson accompanies her to the hospital.  The patient apparently saw her primary care doctor yesterday.  The patient seems confused during my questioning.  Patient denies chest pain, shortness of breath, nausea, vomiting, diarrhea, dizziness, dysuria, fever, blurred vision, near syncope or syncope.  The patient states that she does not know why she is so confused History reviewed. No pertinent past medical history. Past Surgical History  Procedure Laterality Date  . Abdominal hysterectomy     History reviewed. No pertinent family history. History  Substance Use Topics  . Smoking status: Current Every Day Smoker -- 0.50 packs/day    Types: Cigarettes  . Smokeless tobacco: Not on file  . Alcohol Use: No     Comment: recovering alcoholic   OB History    No data available     Review of Systems All other systems negative except as documented in the HPI. All pertinent positives and negatives as reviewed in the HPI.  Allergies  Review of patient's allergies indicates no known allergies.  Home Medications   Prior to Admission medications   Medication Sig Start Date End Date Taking? Authorizing Provider  aspirin EC 81 MG tablet Take 81 mg by mouth daily.    Historical Provider, MD  ibuprofen (ADVIL,MOTRIN) 200 MG tablet Take 200 mg by mouth every 6 (six) hours as needed.    Historical Provider, MD  naproxen sodium (ANAPROX) 220 MG tablet Take 220 mg by mouth 2 (two) times daily  with a meal.    Historical Provider, MD  Omega-3 Fatty Acids (FISH OIL PO) Take 1 tablet by mouth daily.    Historical Provider, MD  ondansetron (ZOFRAN ODT) 4 MG disintegrating tablet Take 1 tablet (4 mg total) by mouth every 8 (eight) hours as needed for nausea or vomiting. 10/11/13   Sherian Maroon, MD  Probiotic Product (HEALTHY COLON PO) Take 1 tablet by mouth daily.    Historical Provider, MD  RENOVA 0.02 % CREA Apply 1 application topically daily. 01/12/14   Historical Provider, MD  simvastatin (ZOCOR) 20 MG tablet Take 1 tablet by mouth daily. 02/07/14   Historical Provider, MD  vitamin E 100 UNIT capsule Take 100 Units by mouth daily.    Historical Provider, MD   BP 152/66 mmHg  Pulse 78  Temp(Src) 98.3 F (36.8 C) (Rectal)  Resp 16  Ht 5' 3.5" (1.613 m)  Wt 121 lb (54.885 kg)  BMI 21.10 kg/m2  SpO2 98% Physical Exam  Constitutional: She appears well-developed and well-nourished.  HENT:  Head: Normocephalic and atraumatic.  Mouth/Throat: Oropharynx is clear and moist.  Eyes: Pupils are equal, round, and reactive to light.  Neck: Normal range of motion. Neck supple.  Cardiovascular: Normal rate, regular rhythm and normal heart sounds.  Exam reveals no gallop and no friction rub.   No murmur heard. Pulmonary/Chest: Effort normal and breath sounds normal. No respiratory distress.  Abdominal: Soft. Bowel sounds are normal. She exhibits no distension. There is no tenderness. There is no guarding.  Neurological:  She is alert. She exhibits normal muscle tone. Coordination normal.  Patient can tell me who the president is and her birthdate, but does not know the day of the week, or year  Skin: Skin is warm and dry. No erythema.  Nursing note and vitals reviewed.   ED Course  Procedures (including critical care time) Labs Review Labs Reviewed  COMPREHENSIVE METABOLIC PANEL - Abnormal; Notable for the following:    Glucose, Bld 100 (*)    GFR calc non Af Amer 80 (*)    All other  components within normal limits  URINALYSIS, ROUTINE W REFLEX MICROSCOPIC - Abnormal; Notable for the following:    APPearance CLOUDY (*)    Glucose, UA 500 (*)    Ketones, ur 15 (*)    Leukocytes, UA SMALL (*)    All other components within normal limits  URINE MICROSCOPIC-ADD ON - Abnormal; Notable for the following:    Squamous Epithelial / LPF FEW (*)    Bacteria, UA MANY (*)    All other components within normal limits  CBG MONITORING, ED - Abnormal; Notable for the following:    Glucose-Capillary 111 (*)    All other components within normal limits  URINE CULTURE  PROTIME-INR  CBC WITH DIFFERENTIAL  CBG MONITORING, ED  I-STAT CG4 LACTIC ACID, ED    Imaging Review Dg Chest 2 View  03/02/2014   CLINICAL DATA:  Cough; confusion  EXAM: CHEST  2 VIEW  COMPARISON:  Jul 04, 2010  FINDINGS: There is no edema or consolidation. Heart size and pulmonary vascularity are normal. No adenopathy. There is lower thoracic and upper lumbar levoscoliosis. There is degenerative change in the thoracic spine.  IMPRESSION: No edema or consolidation.   Electronically Signed   By: Lowella Grip M.D.   On: 03/02/2014 13:03   Ct Head Wo Contrast  03/02/2014   CLINICAL DATA:  Confusion, altered mental status, temporal headache.  EXAM: CT HEAD WITHOUT CONTRAST  TECHNIQUE: Contiguous axial images were obtained from the base of the skull through the vertex without intravenous contrast.  COMPARISON:  None.  FINDINGS: Mild centralized volume loss with commensurate ex vacuo dilatation of the ventricular system. Scattered periventricular hypodensities compatible with microvascular ischemic disease. No CT evidence acute large territory infarct. No intraparenchymal or extra-axial hemorrhage.  There is a punctate (approximately 0.7 cm) calcification within the medial aspect of the right middle cranial fossa (image 7, series 202) which is incompletely evaluated though potentially representative of a small meningioma.  Otherwise, no intraparenchymal or extra-axial mass. Normal configuration of the ventricles and basilar cisterns. No midline shift. Intracranial atherosclerosis.  Limited visualization of the paranasal sinuses and mastoid air cells are normal. No air-fluid levels. Post bilateral cataract surgery. Regional soft tissues appear normal. No displaced calvarial fracture.  IMPRESSION: 1. Mild centralized volume loss and microvascular ischemic disease without acute intracranial process. 2. Indeterminate punctate calcification within the medial aspect of the right middle cranial fossa nonspecific though potentially representing a small meningioma. Further evaluation could be performed with MRI as clinically indicated.   Electronically Signed   By: Sandi Mariscal M.D.   On: 03/02/2014 12:05   The patient will need admission for altered mental status and UTI.  I will speak with the [facility] the patient is stable here in the emergency department   Brent General, PA-C 03/02/14 [place], MD 03/03/14 [phone]

## 2014-03-03 ENCOUNTER — Observation Stay (HOSPITAL_COMMUNITY): Payer: Medicare Other

## 2014-03-03 ENCOUNTER — Encounter (HOSPITAL_COMMUNITY): Payer: Self-pay | Admitting: Neurology

## 2014-03-03 DIAGNOSIS — R410 Disorientation, unspecified: Secondary | ICD-10-CM | POA: Diagnosis not present

## 2014-03-03 DIAGNOSIS — R05 Cough: Secondary | ICD-10-CM | POA: Diagnosis not present

## 2014-03-03 DIAGNOSIS — G9340 Encephalopathy, unspecified: Secondary | ICD-10-CM | POA: Diagnosis not present

## 2014-03-03 DIAGNOSIS — I671 Cerebral aneurysm, nonruptured: Secondary | ICD-10-CM | POA: Diagnosis not present

## 2014-03-03 DIAGNOSIS — J9601 Acute respiratory failure with hypoxia: Secondary | ICD-10-CM | POA: Diagnosis not present

## 2014-03-03 DIAGNOSIS — I6031 Nontraumatic subarachnoid hemorrhage from right posterior communicating artery: Secondary | ICD-10-CM | POA: Diagnosis not present

## 2014-03-03 DIAGNOSIS — E785 Hyperlipidemia, unspecified: Secondary | ICD-10-CM | POA: Diagnosis present

## 2014-03-03 DIAGNOSIS — R4182 Altered mental status, unspecified: Secondary | ICD-10-CM | POA: Diagnosis not present

## 2014-03-03 DIAGNOSIS — F17210 Nicotine dependence, cigarettes, uncomplicated: Secondary | ICD-10-CM | POA: Diagnosis present

## 2014-03-03 DIAGNOSIS — M1990 Unspecified osteoarthritis, unspecified site: Secondary | ICD-10-CM | POA: Diagnosis not present

## 2014-03-03 DIAGNOSIS — G4700 Insomnia, unspecified: Secondary | ICD-10-CM | POA: Diagnosis present

## 2014-03-03 DIAGNOSIS — R159 Full incontinence of feces: Secondary | ICD-10-CM | POA: Diagnosis present

## 2014-03-03 DIAGNOSIS — S066X0A Traumatic subarachnoid hemorrhage without loss of consciousness, initial encounter: Secondary | ICD-10-CM | POA: Diagnosis not present

## 2014-03-03 DIAGNOSIS — G919 Hydrocephalus, unspecified: Secondary | ICD-10-CM | POA: Diagnosis not present

## 2014-03-03 DIAGNOSIS — R4701 Aphasia: Secondary | ICD-10-CM | POA: Diagnosis present

## 2014-03-03 DIAGNOSIS — G910 Communicating hydrocephalus: Secondary | ICD-10-CM | POA: Diagnosis not present

## 2014-03-03 DIAGNOSIS — J9600 Acute respiratory failure, unspecified whether with hypoxia or hypercapnia: Secondary | ICD-10-CM | POA: Diagnosis not present

## 2014-03-03 DIAGNOSIS — Z791 Long term (current) use of non-steroidal anti-inflammatories (NSAID): Secondary | ICD-10-CM | POA: Diagnosis not present

## 2014-03-03 DIAGNOSIS — I10 Essential (primary) hypertension: Secondary | ICD-10-CM | POA: Diagnosis present

## 2014-03-03 DIAGNOSIS — R404 Transient alteration of awareness: Secondary | ICD-10-CM | POA: Diagnosis not present

## 2014-03-03 DIAGNOSIS — Z7982 Long term (current) use of aspirin: Secondary | ICD-10-CM | POA: Diagnosis not present

## 2014-03-03 DIAGNOSIS — Z452 Encounter for adjustment and management of vascular access device: Secondary | ICD-10-CM | POA: Diagnosis not present

## 2014-03-03 DIAGNOSIS — Z4582 Encounter for adjustment or removal of myringotomy device (stent) (tube): Secondary | ICD-10-CM | POA: Diagnosis not present

## 2014-03-03 DIAGNOSIS — R51 Headache: Secondary | ICD-10-CM | POA: Diagnosis not present

## 2014-03-03 DIAGNOSIS — I729 Aneurysm of unspecified site: Secondary | ICD-10-CM | POA: Diagnosis not present

## 2014-03-03 DIAGNOSIS — I69093 Ataxia following nontraumatic subarachnoid hemorrhage: Secondary | ICD-10-CM | POA: Diagnosis not present

## 2014-03-03 DIAGNOSIS — E876 Hypokalemia: Secondary | ICD-10-CM | POA: Diagnosis not present

## 2014-03-03 DIAGNOSIS — J9811 Atelectasis: Secondary | ICD-10-CM | POA: Diagnosis not present

## 2014-03-03 DIAGNOSIS — B965 Pseudomonas (aeruginosa) (mallei) (pseudomallei) as the cause of diseases classified elsewhere: Secondary | ICD-10-CM | POA: Diagnosis present

## 2014-03-03 DIAGNOSIS — I609 Nontraumatic subarachnoid hemorrhage, unspecified: Secondary | ICD-10-CM | POA: Diagnosis not present

## 2014-03-03 DIAGNOSIS — R1310 Dysphagia, unspecified: Secondary | ICD-10-CM | POA: Diagnosis present

## 2014-03-03 DIAGNOSIS — R278 Other lack of coordination: Secondary | ICD-10-CM | POA: Diagnosis present

## 2014-03-03 DIAGNOSIS — Z4682 Encounter for fitting and adjustment of non-vascular catheter: Secondary | ICD-10-CM | POA: Diagnosis not present

## 2014-03-03 DIAGNOSIS — R839 Unspecified abnormal finding in cerebrospinal fluid: Secondary | ICD-10-CM | POA: Diagnosis not present

## 2014-03-03 DIAGNOSIS — G039 Meningitis, unspecified: Secondary | ICD-10-CM | POA: Diagnosis not present

## 2014-03-03 DIAGNOSIS — J95821 Acute postprocedural respiratory failure: Secondary | ICD-10-CM | POA: Diagnosis not present

## 2014-03-03 DIAGNOSIS — B0089 Other herpesviral infection: Secondary | ICD-10-CM | POA: Diagnosis present

## 2014-03-03 DIAGNOSIS — Z982 Presence of cerebrospinal fluid drainage device: Secondary | ICD-10-CM | POA: Diagnosis not present

## 2014-03-03 DIAGNOSIS — R451 Restlessness and agitation: Secondary | ICD-10-CM | POA: Diagnosis not present

## 2014-03-03 DIAGNOSIS — N390 Urinary tract infection, site not specified: Secondary | ICD-10-CM | POA: Diagnosis not present

## 2014-03-03 MED ORDER — DEXTROSE 5 % IV SOLN
500.0000 mg | Freq: Two times a day (BID) | INTRAVENOUS | Status: DC
  Administered 2014-03-03 – 2014-03-04 (×3): 500 mg via INTRAVENOUS
  Filled 2014-03-03 (×7): qty 10

## 2014-03-03 MED ORDER — VANCOMYCIN HCL IN DEXTROSE 1-5 GM/200ML-% IV SOLN
1000.0000 mg | INTRAVENOUS | Status: DC
  Filled 2014-03-03 (×2): qty 200

## 2014-03-03 MED ORDER — SODIUM CHLORIDE 0.9 % IV SOLN
2.0000 g | INTRAVENOUS | Status: DC
  Administered 2014-03-03 – 2014-03-04 (×9): 2 g via INTRAVENOUS
  Filled 2014-03-03 (×15): qty 2000

## 2014-03-03 MED ORDER — ONDANSETRON HCL 4 MG/2ML IJ SOLN
4.0000 mg | Freq: Four times a day (QID) | INTRAMUSCULAR | Status: DC | PRN
  Administered 2014-03-03: 4 mg via INTRAVENOUS
  Filled 2014-03-03: qty 2

## 2014-03-03 MED ORDER — ACYCLOVIR SODIUM 50 MG/ML IV SOLN
500.0000 mg | Freq: Two times a day (BID) | INTRAVENOUS | Status: DC
  Filled 2014-03-03 (×2): qty 10

## 2014-03-03 MED ORDER — CEFTRIAXONE SODIUM IN DEXTROSE 40 MG/ML IV SOLN
2.0000 g | Freq: Two times a day (BID) | INTRAVENOUS | Status: DC
  Filled 2014-03-03: qty 50

## 2014-03-03 MED ORDER — VANCOMYCIN HCL IN DEXTROSE 1-5 GM/200ML-% IV SOLN
1000.0000 mg | INTRAVENOUS | Status: DC
  Administered 2014-03-03: 1000 mg via INTRAVENOUS
  Filled 2014-03-03: qty 200

## 2014-03-03 MED ORDER — SODIUM CHLORIDE 0.9 % IV BOLUS (SEPSIS)
500.0000 mL | Freq: Once | INTRAVENOUS | Status: AC
  Administered 2014-03-03: 500 mL via INTRAVENOUS

## 2014-03-03 MED ORDER — SODIUM CHLORIDE 0.9 % IV SOLN
INTRAVENOUS | Status: DC
  Administered 2014-03-03 – 2014-03-06 (×5): via INTRAVENOUS
  Administered 2014-03-08: 1000 mL via INTRAVENOUS
  Administered 2014-03-08 – 2014-03-18 (×11): via INTRAVENOUS

## 2014-03-03 MED ORDER — CEFTRIAXONE SODIUM IN DEXTROSE 40 MG/ML IV SOLN
2.0000 g | Freq: Two times a day (BID) | INTRAVENOUS | Status: DC
  Administered 2014-03-03 (×2): 2 g via INTRAVENOUS
  Filled 2014-03-03 (×7): qty 50

## 2014-03-03 MED ORDER — LORAZEPAM 2 MG/ML IJ SOLN
INTRAMUSCULAR | Status: AC
  Administered 2014-03-03: 1 mg via INTRAVENOUS
  Filled 2014-03-03: qty 1

## 2014-03-03 MED ORDER — CEFTRIAXONE SODIUM IN DEXTROSE 40 MG/ML IV SOLN
2.0000 g | Freq: Two times a day (BID) | INTRAVENOUS | Status: DC
  Administered 2014-03-03: 2 g via INTRAVENOUS
  Filled 2014-03-03 (×2): qty 50

## 2014-03-03 MED ORDER — ENSURE COMPLETE PO LIQD
237.0000 mL | Freq: Two times a day (BID) | ORAL | Status: DC
  Administered 2014-03-03: 237 mL via ORAL

## 2014-03-03 NOTE — Progress Notes (Signed)
ANTIBIOTIC CONSULT NOTE - INITIAL  Pharmacy Consult for Acyclovir and Vancomycin  Indication: r/o meningitis  No Known Allergies  Patient Measurements: Height: 5\' 3"  (160 cm) Weight: 120 lb 15.8 oz (54.88 kg) IBW/kg (Calculated) : 52.4  Vital Signs: Temp: 98.9 F (37.2 C) (12/30 0609) Temp Source: Oral (12/30 0609) BP: 124/61 mmHg (12/30 0609) Pulse Rate: 73 (12/30 0609) Intake/Output from previous day: 12/29 0701 - 12/30 0700 In: 375 [I.V.:375] Out: 200 [Urine:200] Intake/Output from this shift:    Labs:  Recent Labs  03/02/14 1048  WBC 9.0  HGB 12.7  PLT 275  CREATININE 0.75   Estimated Creatinine Clearance: 48.7 mL/min (by C-G formula based on Cr of 0.75). No results for input(s): VANCOTROUGH, VANCOPEAK, VANCORANDOM, GENTTROUGH, GENTPEAK, GENTRANDOM, TOBRATROUGH, TOBRAPEAK, TOBRARND, AMIKACINPEAK, AMIKACINTROU, AMIKACIN in the last 72 hours.   Microbiology: No results found for this or any previous visit (from the past 720 hour(s)).  Medical History: History reviewed. No pertinent past medical history.  Medications:  Prescriptions prior to admission  Medication Sig Dispense Refill Last Dose  . aspirin EC 81 MG tablet Take 81 mg by mouth daily.   03/02/2014 at Unknown time  . cholecalciferol (VITAMIN D) 1000 UNITS tablet Take 1,000 Units by mouth daily.   03/02/2014 at Unknown time  . ibuprofen (ADVIL,MOTRIN) 200 MG tablet Take 200 mg by mouth every 6 (six) hours as needed.   Past Month at Unknown time  . Minoxidil (ROGAINE EX) Apply 1 application topically daily.   03/02/2014 at Unknown time  . naproxen sodium (ANAPROX) 220 MG tablet Take 220 mg by mouth 2 (two) times daily with a meal.   unknown at Unknown time  . Omega-3 Fatty Acids (FISH OIL PO) Take 1 tablet by mouth daily.   03/02/2014 at Unknown time  . RENOVA 0.02 % CREA Apply 1 application topically daily.  3 03/02/2014 at Unknown time  . simvastatin (ZOCOR) 20 MG tablet Take 1 tablet by mouth daily.   6 03/02/2014 at Unknown time  . vitamin E 100 UNIT capsule Take 100 Units by mouth daily.   unknown at Unknown time  . ondansetron (ZOFRAN ODT) 4 MG disintegrating tablet Take 1 tablet (4 mg total) by mouth every 8 (eight) hours as needed for nausea or vomiting. 2 tablet 0 unknown   Assessment: 79 yo female with AMS, possible meningitis, for empiric antibiotics  Goal of Therapy:  Vancomycin trough level 15-20 mcg/ml  Plan:  Vancomcyin 1 g IV q24h Acyclovir 500 mg IV q12h  [name] 03/03/2014,7:41 AM

## 2014-03-03 NOTE — Progress Notes (Signed)
Pt. Back from MRI sleepy with intermittent restlessness and confusion. Grandson at the bedside very concern. Pt. Slightly combative when woken up but able to calm down after with reassurance and reorienting. Pt. Went back to sleep and appears calm. K. SchorrNP was notified about pts. neuro status and MRI that was not done due to pts. can't stay still and agitated even after dose of ativan. Will continue to monitor and will notify for severe agitation.

## 2014-03-03 NOTE — Consult Note (Signed)
NEURO HOSPITALIST CONSULT NOTE    Reason for Consult: AMS and possible Meningitis.   HPI:                                                                                                                                          Sharon [name] is an 79 y.o. female who was noted to be sleeping more than usual since November. Patient was alert and coherent until December 28 when she was noted to be slightly confused.  One day later she seemed to be more confused. Patient was brought to ED and admitted under hospitalist service. Initial CT scan of head showed no acute stroke or bleed. Initial abs showed WBC 9.0, Glucose 100, UA with cloudy, small amount of leukocytes and negative nitrites. Blood and urine culture pending. Patient currently has been placed on Acyclovir, Ampicillin, Rocephin and vancomycin to treat possible meningitis. Over night patient has noted to spike a temperature of 101.3 and appeared to have neck stiffness. Neurology was called to evaluate person for AMS and neck stiffness.  On consultation, she is alert but remains confused--unable to state where she is, month or year.  She is able to follow commands but is having word finding difficulty.  She does not appear toxic.  I reviewed patient CT brain and there is not evidence of acute intracranial abnormality or other lesions that could explain her presentation.    Family history: there is not history of neurological disorders.  Past Surgical History  Procedure Laterality Date  . Abdominal hysterectomy      Family History  Problem Relation Age of Onset  . Hypertension Mother   . Hypertension Father     Social History:  reports that she has been smoking Cigarettes.  She has been smoking about 0.50 packs per day. She does not have any smokeless tobacco history on file. She reports that she does not drink alcohol or use illicit drugs.  No Known Allergies  MEDICATIONS:                                                                                                                      Prior to Admission:  Prescriptions prior to admission  Medication Sig Dispense Refill Last Dose  . aspirin EC 81 MG  tablet Take 81 mg by mouth daily.   03/02/2014 at Unknown time  . cholecalciferol (VITAMIN D) 1000 UNITS tablet Take 1,000 Units by mouth daily.   03/02/2014 at Unknown time  . ibuprofen (ADVIL,MOTRIN) 200 MG tablet Take 200 mg by mouth every 6 (six) hours as needed.   Past Month at Unknown time  . Minoxidil (ROGAINE EX) Apply 1 application topically daily.   03/02/2014 at Unknown time  . naproxen sodium (ANAPROX) 220 MG tablet Take 220 mg by mouth 2 (two) times daily with a meal.   unknown at Unknown time  . Omega-3 Fatty Acids (FISH OIL PO) Take 1 tablet by mouth daily.   03/02/2014 at Unknown time  . RENOVA 0.02 % CREA Apply 1 application topically daily.  3 03/02/2014 at Unknown time  . simvastatin (ZOCOR) 20 MG tablet Take 1 tablet by mouth daily.  6 03/02/2014 at Unknown time  . vitamin E 100 UNIT capsule Take 100 Units by mouth daily.   unknown at Unknown time  . ondansetron (ZOFRAN ODT) 4 MG disintegrating tablet Take 1 tablet (4 mg total) by mouth every 8 (eight) hours as needed for nausea or vomiting. 2 tablet 0 unknown   Scheduled: . acyclovir  500 mg Intravenous Q12H  . ampicillin (OMNIPEN) IV  2 g Intravenous 6 times per day  . aspirin EC  81 mg Oral Daily  . cefTRIAXone (ROCEPHIN)  IV  2 g Intravenous Q12H  . simvastatin  20 mg Oral q1800  . [START ON 03/04/2014] vancomycin  1,000 mg Intravenous Q24H     ROS: unable to obtain due to altered mental status.                                                                                                                                      History obtained from unobtainable from patient due to mental status    Physical exam: HEENT-  Normocephalic, no lesions, without obvious abnormality.  Normal external eye and conjunctiva.  Normal  TM's bilaterally.  Normal auditory canals and external ears. Normal external nose, mucus membranes and septum.  Normal pharynx. Cardiovascular- S1, S2 normal, pulses palpable throughout   Lungs- chest clear, no wheezing, rales, normal symmetric air entry Abdomen- soft, non-tender; bowel sounds normal; no masses,  no organomegaly Extremities- no edema Lymph-no adenopathy palpable Musculoskeletal-no joint tenderness, deformity or swelling, no muscular tenderness noted Skin-warm and dry, no hyperpigmentation, vitiligo, or suspicious lesions Blood pressure 124/61, pulse 73, temperature 98.9 F (37.2 C), temperature source Oral, resp. rate 15, height 5\' 3"  (1.6 m), weight 54.88 kg (120 lb 15.8 oz), SpO2 96 %.  Neurologic Examination:  Mental Status: Alert, not oriented to place, year, month.  Speech fluent with expressive aphasia.  She is able to follow my commands. Cranial Nerves: II: Discs flat bilaterally; Visual fields grossly normal, pupils equal, round, reactive to light and accommodation III,IV, VI: ptosis not present, extra-ocular motions intact bilaterally V,VII: smile symmetric, facial light touch sensation normal bilaterally VIII: hearing normal bilaterally IX,X: gag reflex present XI: bilateral shoulder shrug XII: midline tongue extension Motor: Right : Upper extremity   5/5    Left:     Upper extremity   5/5  Lower extremity   5/5     Lower extremity   5/5 Tone and bulk:normal tone throughout; no atrophy noted Sensory: Pinprick and light touch intact throughout, bilaterally Deep Tendon Reflexes: 2+ and symmetric throughout UE and no KJ or AJ bilaterally Plantars: Right: downgoing   Left: downgoing Cerebellar: normal finger-to-nose, normal heel-to-shin test Gait: not tested due to confusion.    Lab Results: Basic Metabolic Panel:  Recent Labs Lab 03/02/14 1048  NA 135  K  4.0  CL 100  CO2 26  GLUCOSE 100*  BUN 17  CREATININE 0.75  CALCIUM 9.2    Liver Function Tests:  Recent Labs Lab 03/02/14 1048  AST 21  ALT 12  ALKPHOS 51  BILITOT 0.7  PROT 6.8  ALBUMIN 4.0   No results for input(s): LIPASE, AMYLASE in the last 168 hours. No results for input(s): AMMONIA in the last 168 hours.  CBC:  Recent Labs Lab 03/02/14 1048  WBC 9.0  NEUTROABS 6.5  HGB 12.7  HCT 38.1  MCV 94.8  PLT 275    Cardiac Enzymes: No results for input(s): CKTOTAL, CKMB, CKMBINDEX, TROPONINI in the last 168 hours.  Lipid Panel: No results for input(s): CHOL, TRIG, HDL, CHOLHDL, VLDL, LDLCALC in the last 168 hours.  CBG:  Recent Labs Lab 03/02/14 1042  GLUCAP 111*    Microbiology: No results found for this or any previous visit.  Coagulation Studies:  Recent Labs  03/02/14 1048  LABPROT 13.4  INR 1.01    Imaging: Dg Chest 2 View  03/02/2014   CLINICAL DATA:  Cough; confusion  EXAM: CHEST  2 VIEW  COMPARISON:  Jul 04, 2010  FINDINGS: There is no edema or consolidation. Heart size and pulmonary vascularity are normal. No adenopathy. There is lower thoracic and upper lumbar levoscoliosis. There is degenerative change in the thoracic spine.  IMPRESSION: No edema or consolidation.   Electronically Signed   By: Lowella Grip M.D.   On: 03/02/2014 13:03   Ct Head Wo Contrast  03/02/2014   CLINICAL DATA:  Confusion, altered mental status, temporal headache.  EXAM: CT HEAD WITHOUT CONTRAST  TECHNIQUE: Contiguous axial images were obtained from the base of the skull through the vertex without intravenous contrast.  COMPARISON:  None.  FINDINGS: Mild centralized volume loss with commensurate ex vacuo dilatation of the ventricular system. Scattered periventricular hypodensities compatible with microvascular ischemic disease. No CT evidence acute large territory infarct. No intraparenchymal or extra-axial hemorrhage.  There is a punctate (approximately 0.7 cm)  calcification within the medial aspect of the right middle cranial fossa (image 7, series 202) which is incompletely evaluated though potentially representative of a small meningioma. Otherwise, no intraparenchymal or extra-axial mass. Normal configuration of the ventricles and basilar cisterns. No midline shift. Intracranial atherosclerosis.  Limited visualization of the paranasal sinuses and mastoid air cells are normal. No air-fluid levels. Post bilateral cataract surgery. Regional soft tissues appear normal. No  displaced calvarial fracture.  IMPRESSION: 1. Mild centralized volume loss and microvascular ischemic disease without acute intracranial process. 2. Indeterminate punctate calcification within the medial aspect of the right middle cranial fossa nonspecific though potentially representing a small meningioma. Further evaluation could be performed with MRI as clinically indicated.   Electronically Signed   By: Sandi Mariscal M.D.   On: 03/02/2014 12:05    Assessment and plan per attending neurologist  Etta Quill PA-C [facility] [phone]  03/03/2014, 10:19 AM   Assessment/Plan: 79 YO female with progressive three day history of confusion and transient fever. Thus far UA only shows leukocytes and UC/ BC pending. Patient does not appear toxic and exam shows normal findings with no meningismus.I reviewed patient CT brain and there is not evidence of acute intracranial abnormality or other lesions that could explain her presentation.  Etiology of confusion unclear at this time. As she does not appear toxic and has no neck stiffness, would like to obtain MRI and both UC/BC prior to attempting LP.  Patient currently covered with both ABX and Acyclovir.   Recommend: 1) Continue ABX and Acyclovir 2) Ordered MRI brain. UC/BC as per primary team 3) If all the above are negative will obtain LP tomorrow morning.  Patient has been taken off Lovenox and placed on SCD's for preparation of LP  if needed.    Patient seen and examined together with physician assistant and I concur with the assessment and plan.  Dorian Pod, MD

## 2014-03-03 NOTE — Progress Notes (Signed)
UR completed 

## 2014-03-03 NOTE — Progress Notes (Signed)
PROGRESS NOTE  Sharon [name] EHO:[id number] DOB: 07-01-1934 DOA: 03/02/2014 PCP: Osborne Casco, MD  HPI: 79 y.o. female with history of dyslipidemia, presented to the ED with altered mental status. Patient is unable to provide any history secondary to altered mental status  Subjective / 24 H Interval events - fever overnight, mild agitation and unable to do MRI  Assessment/Plan: Principal Problem:   Acute encephalopathy Active Problems:   Headache   UTI (lower urinary tract infection)   Dyslipidemia   Acute encephalopathy - patient with headaches, generalized symptoms for 2 days and inability to flex her neck due to neck pain and rigidity this morning, I am a bit concerned for meningitis, important to r/o - broaden her antibiotics  - neurology consulted today  - no history of migraine headaches, last headache more than 2 years ago - febrile last night, blood cultures obtained - unable to obtain MRI today, plan with sedation tomorrow  UTI - patient without any UTI-type symptoms, awaiting cultures. - no history of recurrent infections  HLD - continue statin   Diet: Diet Heart Fluids: NS DVT Prophylaxis: Lovenox, now SCD  Code Status: Full Code Family Communication: d/w boyfriend  Disposition Plan: remain inpatient   Consultants:  Neurology   Procedures:  None    Antibiotics  Anti-infectives    Start     Dose/Rate Route Frequency Ordered Stop   03/04/14 0830  vancomycin (VANCOCIN) IVPB 1000 mg/200 mL premix     1,000 mg200 mL/hr over 60 Minutes Intravenous Every 24 hours 03/03/14 0838     03/03/14 1300  cefTRIAXone (ROCEPHIN) 1 g in dextrose 5 % 50 mL IVPB - Premix  Status:  Discontinued     1 g100 mL/hr over 30 Minutes Intravenous Every 24 hours 03/02/14 1603 03/03/14 0736   03/03/14 1030  acyclovir (ZOVIRAX) 500 mg in dextrose 5 % 100 mL IVPB     500 mg110 mL/hr over 60 Minutes Intravenous Every 12 hours 03/03/14 0838     03/03/14 1000   cefTRIAXone (ROCEPHIN) 2 g in dextrose 5 % 50 mL IVPB - Premix  Status:  Discontinued     2 g100 mL/hr over 30 Minutes Intravenous Every 12 hours 03/03/14 0736 03/03/14 0745   03/03/14 0930  cefTRIAXone (ROCEPHIN) 2 g in dextrose 5 % 50 mL IVPB - Premix     2 g100 mL/hr over 30 Minutes Intravenous Every 12 hours 03/03/14 0838     03/03/14 0900  acyclovir (ZOVIRAX) 500 mg in dextrose 5 % 100 mL IVPB  Status:  Discontinued     500 mg110 mL/hr over 60 Minutes Intravenous Every 12 hours 03/03/14 0745 03/03/14 0838   03/03/14 0830  cefTRIAXone (ROCEPHIN) 2 g in dextrose 5 % 50 mL IVPB - Premix  Status:  Discontinued     2 g100 mL/hr over 30 Minutes Intravenous Every 12 hours 03/03/14 0745 03/03/14 0838   03/03/14 0800  ampicillin (OMNIPEN) 2 g in sodium chloride 0.9 % 50 mL IVPB     2 g150 mL/hr over 20 Minutes Intravenous 6 times per day 03/03/14 0736     03/03/14 0800  vancomycin (VANCOCIN) IVPB 1000 mg/200 mL premix  Status:  Discontinued     1,000 mg200 mL/hr over 60 Minutes Intravenous Every 24 hours 03/03/14 0745 03/03/14 0838   03/02/14 1300  cefTRIAXone (ROCEPHIN) 1 g in dextrose 5 % 50 mL IVPB     1 g100 mL/hr over 30 Minutes Intravenous  Once 03/02/14 1249 03/02/14 1525  Studies  Dg Chest 2 View  03/02/2014   CLINICAL DATA:  Cough; confusion  EXAM: CHEST  2 VIEW  COMPARISON:  Jul 04, 2010  FINDINGS: There is no edema or consolidation. Heart size and pulmonary vascularity are normal. No adenopathy. There is lower thoracic and upper lumbar levoscoliosis. There is degenerative change in the thoracic spine.  IMPRESSION: No edema or consolidation.   Electronically Signed   By: Lowella Grip M.D.   On: 03/02/2014 13:03   Ct Head Wo Contrast  03/02/2014   CLINICAL DATA:  Confusion, altered mental status, temporal headache.  EXAM: CT HEAD WITHOUT CONTRAST  TECHNIQUE: Contiguous axial images were obtained from the base of the skull through the vertex without intravenous contrast.   COMPARISON:  None.  FINDINGS: Mild centralized volume loss with commensurate ex vacuo dilatation of the ventricular system. Scattered periventricular hypodensities compatible with microvascular ischemic disease. No CT evidence acute large territory infarct. No intraparenchymal or extra-axial hemorrhage.  There is a punctate (approximately 0.7 cm) calcification within the medial aspect of the right middle cranial fossa (image 7, series 202) which is incompletely evaluated though potentially representative of a small meningioma. Otherwise, no intraparenchymal or extra-axial mass. Normal configuration of the ventricles and basilar cisterns. No midline shift. Intracranial atherosclerosis.  Limited visualization of the paranasal sinuses and mastoid air cells are normal. No air-fluid levels. Post bilateral cataract surgery. Regional soft tissues appear normal. No displaced calvarial fracture.  IMPRESSION: 1. Mild centralized volume loss and microvascular ischemic disease without acute intracranial process. 2. Indeterminate punctate calcification within the medial aspect of the right middle cranial fossa nonspecific though potentially representing a small meningioma. Further evaluation could be performed with MRI as clinically indicated.   Electronically Signed   By: Sandi Mariscal M.D.   On: 03/02/2014 12:05     Objective  Filed Vitals:   03/02/14 1600 03/02/14 1610 03/02/14 2327 03/03/14 0609  BP: 148/67 148/67 131/55 124/61  Pulse: 77 80 77 73  Temp:  99.8 F (37.7 C) 101.3 F (38.5 C) 98.9 F (37.2 C)  TempSrc:  Oral Oral Oral  Resp:  12 18 15   Height:  5\' 3"  (1.6 m)    Weight:  54.88 kg (120 lb 15.8 oz)    SpO2:  97% 96% 96%    Intake/Output Summary (Last 24 hours) at 03/03/14 0738 Last data filed at 03/03/14 0000  Gross per 24 hour  Intake    375 ml  Output    200 ml  Net    175 ml   Filed Weights   03/02/14 1030 03/02/14 1610  Weight: 54.885 kg (121 lb) 54.88 kg (120 lb 15.8 oz)     Exam:  General:  NAD, pleasant however with slight confusion, oriented to place/month, difficulties with year  Cardiovascular: RRR  Respiratory: CTA biL, no wheezing   Abdomen: soft, non tender  MSK: no clubbing  Neuro: non focal, I appreciate some nuchal rigidity  Data Reviewed: Basic Metabolic Panel:  Recent Labs Lab 03/02/14 1048  NA 135  K 4.0  CL 100  CO2 26  GLUCOSE 100*  BUN 17  CREATININE 0.75  CALCIUM 9.2   Liver Function Tests:  Recent Labs Lab 03/02/14 1048  AST 21  ALT 12  ALKPHOS 51  BILITOT 0.7  PROT 6.8  ALBUMIN 4.0   CBC:  Recent Labs Lab 03/02/14 1048  WBC 9.0  NEUTROABS 6.5  HGB 12.7  HCT 38.1  MCV 94.8  PLT 275  CBG:  Recent Labs Lab 03/02/14 1042  GLUCAP 111*    Scheduled Meds: . ampicillin (OMNIPEN) IV  2 g Intravenous 6 times per day  . aspirin EC  81 mg Oral Daily  . cefTRIAXone (ROCEPHIN)  IV  2 g Intravenous Q12H  . enoxaparin (LOVENOX) injection  40 mg Subcutaneous Q24H  . simvastatin  20 mg Oral q1800   Continuous Infusions: . sodium chloride 50 mL/hr at 03/03/14 0000    Marzetta Board, MD [facility] Pager [phone]. If 7 PM - 7 AM, please contact night-coverage at [email or web address], password [facility] 03/03/2014, 7:38 AM  LOS: 1 day

## 2014-03-04 ENCOUNTER — Inpatient Hospital Stay (HOSPITAL_COMMUNITY): Payer: Medicare Other | Admitting: Certified Registered"

## 2014-03-04 ENCOUNTER — Inpatient Hospital Stay (HOSPITAL_COMMUNITY): Payer: Medicare Other

## 2014-03-04 ENCOUNTER — Inpatient Hospital Stay (HOSPITAL_COMMUNITY): Payer: Medicare Other | Admitting: Certified Registered Nurse Anesthetist

## 2014-03-04 ENCOUNTER — Encounter (HOSPITAL_COMMUNITY)
Admission: EM | Disposition: A | Discharge status: Rehabilitation Facility | Payer: Self-pay | Source: Home / Self Care | Attending: Internal Medicine

## 2014-03-04 ENCOUNTER — Encounter (HOSPITAL_COMMUNITY): Payer: Self-pay | Admitting: Certified Registered Nurse Anesthetist

## 2014-03-04 DIAGNOSIS — I729 Aneurysm of unspecified site: Secondary | ICD-10-CM

## 2014-03-04 DIAGNOSIS — G039 Meningitis, unspecified: Secondary | ICD-10-CM

## 2014-03-04 HISTORY — PX: RADIOLOGY WITH ANESTHESIA: SHX6223

## 2014-03-04 LAB — URINE CULTURE: Colony Count: >=100000

## 2014-03-04 LAB — GRAM STAIN

## 2014-03-04 LAB — CBC
HCT: 33.3 % — ABNORMAL LOW (ref 36.0–46.0)
HEMOGLOBIN: 11 g/dL — AB (ref 12.0–15.0)
MCH: 31.2 pg (ref 26.0–34.0)
MCHC: 33 g/dL (ref 30.0–36.0)
MCV: 94.3 fL (ref 78.0–100.0)
Platelets: 246 10*3/uL (ref 150–400)
RBC: 3.53 MIL/uL — ABNORMAL LOW (ref 3.87–5.11)
RDW: 12.4 % (ref 11.5–15.5)
WBC: 10 10*3/uL (ref 4.0–10.5)

## 2014-03-04 LAB — CSF CELL COUNT WITH DIFFERENTIAL
EOS CSF: 1 % (ref 0–1)
EOS CSF: 1 % (ref 0–1)
Lymphs, CSF: 67 % (ref 40–80)
Lymphs, CSF: 69 % (ref 40–80)
MONOCYTE-MACROPHAGE-SPINAL FLUID: 6 % — AB (ref 15–45)
MONOCYTE-MACROPHAGE-SPINAL FLUID: 7 % — AB (ref 15–45)
RBC COUNT CSF: 7200 /mm3 — AB
RBC Count, CSF: 7800 /mm3 — ABNORMAL HIGH
SEGMENTED NEUTROPHILS-CSF: 23 % — AB (ref 0–6)
Segmented Neutrophils-CSF: 26 % — ABNORMAL HIGH (ref 0–6)
Tube #: 1
Tube #: 4
WBC CSF: 158 /mm3 — AB (ref 0–5)
WBC, CSF: 194 /mm3 (ref 0–5)

## 2014-03-04 LAB — BASIC METABOLIC PANEL
Anion gap: 6 (ref 5–15)
BUN: 12 mg/dL (ref 6–23)
CALCIUM: 7.6 mg/dL — AB (ref 8.4–10.5)
CO2: 24 mmol/L (ref 19–32)
Chloride: 101 mEq/L (ref 96–112)
Creatinine, Ser: 0.66 mg/dL (ref 0.50–1.10)
GFR, EST NON AFRICAN AMERICAN: 83 mL/min — AB (ref >90)
GLUCOSE: 95 mg/dL (ref 70–99)
POTASSIUM: 3.7 mmol/L (ref 3.5–5.1)
Sodium: 131 mmol/L — ABNORMAL LOW (ref 135–145)

## 2014-03-04 LAB — PROTEIN AND GLUCOSE, CSF
GLUCOSE CSF: 36 mg/dL — AB (ref 43–76)
TOTAL PROTEIN, CSF: 84 mg/dL — AB (ref 15–45)

## 2014-03-04 LAB — MRSA PCR SCREENING: MRSA by PCR: NEGATIVE

## 2014-03-04 SURGERY — RADIOLOGY WITH ANESTHESIA
Anesthesia: General

## 2014-03-04 MED ORDER — IOHEXOL 300 MG/ML  SOLN
150.0000 mL | Freq: Once | INTRAMUSCULAR | Status: AC | PRN
  Administered 2014-03-04: 180 mL via INTRAVENOUS

## 2014-03-04 MED ORDER — HALOPERIDOL LACTATE 5 MG/ML IJ SOLN
INTRAMUSCULAR | Status: AC
  Filled 2014-03-04: qty 1

## 2014-03-04 MED ORDER — LIDOCAINE HCL 1 % IJ SOLN
INTRAMUSCULAR | Status: AC
Start: 2014-03-04 — End: 2014-03-05
  Filled 2014-03-04: qty 20

## 2014-03-04 MED ORDER — ARTIFICIAL TEARS OP OINT
TOPICAL_OINTMENT | OPHTHALMIC | Status: DC | PRN
  Administered 2014-03-04: 1 via OPHTHALMIC

## 2014-03-04 MED ORDER — PROPOFOL 10 MG/ML IV BOLUS
INTRAVENOUS | Status: DC | PRN
  Administered 2014-03-04: 50 mg via INTRAVENOUS
  Administered 2014-03-04: 130 mg via INTRAVENOUS

## 2014-03-04 MED ORDER — NIMODIPINE 30 MG PO CAPS
30.0000 mg | ORAL_CAPSULE | ORAL | Status: DC
  Filled 2014-03-04 (×4): qty 1

## 2014-03-04 MED ORDER — LACTATED RINGERS IV SOLN
INTRAVENOUS | Status: DC | PRN
  Administered 2014-03-04: 23:00:00 via INTRAVENOUS

## 2014-03-04 MED ORDER — DEXAMETHASONE SODIUM PHOSPHATE 4 MG/ML IJ SOLN
4.0000 mg | Freq: Four times a day (QID) | INTRAMUSCULAR | Status: DC
  Administered 2014-03-05 – 2014-03-10 (×25): 4 mg via INTRAVENOUS
  Filled 2014-03-04 (×30): qty 1

## 2014-03-04 MED ORDER — LIDOCAINE HCL (CARDIAC) 20 MG/ML IV SOLN
INTRAVENOUS | Status: DC | PRN
  Administered 2014-03-04: 40 mg via INTRAVENOUS

## 2014-03-04 MED ORDER — FENTANYL CITRATE 0.05 MG/ML IJ SOLN
INTRAMUSCULAR | Status: DC | PRN
  Administered 2014-03-04: 100 ug via INTRAVENOUS

## 2014-03-04 MED ORDER — SUCCINYLCHOLINE CHLORIDE 20 MG/ML IJ SOLN
INTRAMUSCULAR | Status: DC | PRN
  Administered 2014-03-04: 80 mg via INTRAVENOUS

## 2014-03-04 MED ORDER — DEXAMETHASONE SODIUM PHOSPHATE 10 MG/ML IJ SOLN
10.0000 mg | Freq: Once | INTRAMUSCULAR | Status: AC
  Administered 2014-03-04: 10 mg via INTRAVENOUS
  Filled 2014-03-04: qty 1

## 2014-03-04 MED ORDER — LACTATED RINGERS IV SOLN
INTRAVENOUS | Status: DC | PRN
  Administered 2014-03-04: via INTRAVENOUS

## 2014-03-04 MED ORDER — GADOBENATE DIMEGLUMINE 529 MG/ML IV SOLN
10.0000 mL | Freq: Once | INTRAVENOUS | Status: AC | PRN
  Administered 2014-03-04: 10 mL via INTRAVENOUS

## 2014-03-04 MED ORDER — ROCURONIUM BROMIDE 100 MG/10ML IV SOLN
INTRAVENOUS | Status: DC | PRN
  Administered 2014-03-04: 30 mg via INTRAVENOUS
  Administered 2014-03-05: 20 mg via INTRAVENOUS
  Administered 2014-03-05: 30 mg via INTRAVENOUS

## 2014-03-04 MED ORDER — HALOPERIDOL LACTATE 5 MG/ML IJ SOLN
2.0000 mg | INTRAMUSCULAR | Status: AC
  Administered 2014-03-04: 2 mg via INTRAVENOUS

## 2014-03-04 MED ORDER — NITROGLYCERIN 1 MG/10 ML FOR IR/CATH LAB
INTRA_ARTERIAL | Status: AC
  Filled 2014-03-04: qty 10

## 2014-03-04 MED ORDER — SODIUM CHLORIDE 0.9 % IV SOLN
10.0000 mg | INTRAVENOUS | Status: DC | PRN
  Administered 2014-03-04: 5 ug/min via INTRAVENOUS

## 2014-03-04 NOTE — Procedures (Signed)
LP Procedure Note:  Patient has been seen and examined.  Chart has been reviewed.  LP is being performed to evaluate for Meningitis.  Procedure has been explained to patient/family including risks and benefits.  Consent has been signed by patient/family and witnessed.   Blood pressure 110/60, pulse 73, temperature 98.3 F (36.8 C), temperature source Oral, resp. rate 18, height 5\' 3"  (1.6 m), weight 54.88 kg (120 lb 15.8 oz), SpO2 92 %.  Current facility-administered medications: 0.9 %  sodium chloride infusion, , Intravenous, Continuous, Caren Griffins, MD, Last Rate: 75 mL/hr at 03/03/14 2024;  acetaminophen (TYLENOL) tablet 650 mg, 650 mg, Oral, Q6H PRN **OR** acetaminophen (TYLENOL) suppository 650 mg, 650 mg, Rectal, Q6H PRN, Modena Jansky, MD, 650 mg at 03/02/14 2332 acyclovir (ZOVIRAX) 500 mg in dextrose 5 % 100 mL IVPB, 500 mg, Intravenous, Q12H, Caren Griffins, MD, 500 mg at 03/04/14 0025;  ampicillin (OMNIPEN) 2 g in sodium chloride 0.9 % 50 mL IVPB, 2 g, Intravenous, 6 times per day, Caren Griffins, MD, 2 g at 03/04/14 0413;  aspirin EC tablet 81 mg, 81 mg, Oral, Daily, Modena Jansky, MD, 81 mg at 03/03/14 1031 cefTRIAXone (ROCEPHIN) 2 g in dextrose 5 % 50 mL IVPB - Premix, 2 g, Intravenous, Q12H, Caren Griffins, MD, 2 g at 03/03/14 2212;  feeding supplement (ENSURE COMPLETE) (ENSURE COMPLETE) liquid 237 mL, 237 mL, Oral, BID BM, Caren Griffins, MD, 237 mL at 03/03/14 1454;  haloperidol lactate (HALDOL) 5 MG/ML injection, , , , ;  ondansetron (ZOFRAN) injection 4 mg, 4 mg, Intravenous, Q6H PRN, Caren Griffins, MD, 4 mg at 03/03/14 1110 simvastatin (ZOCOR) tablet 20 mg, 20 mg, Oral, q1800, Modena Jansky, MD, 20 mg at 03/02/14 1713;  vancomycin (VANCOCIN) IVPB 1000 mg/200 mL premix, 1,000 mg, Intravenous, Q24H, Caren Griffins, MD   Recent Labs  03/02/14 1048 03/04/14 0545  WBC 9.0 10.0  HGB 12.7 11.0*  HCT 38.1 33.3*  PLT 275 246  INR 1.01  --     CT of head:  was reviewed   Patient was placed in the lateral decub/sitting position.  Area was cleaned with betadine and anesthetized with lidocaine.  Under sterile conditions 20G LP needle was placed at approximately L3-4 however due to patient unable to remain still LP was aborted. Patient will need LP to be performed under Anesthesia by flouro.    Etta Quill PA-C [facility] [phone]  03/04/2014, 11:00 AM  Patient seen and examined together with physician assistant and I concur with the assessment and plan.  Dorian Pod, MD

## 2014-03-04 NOTE — Procedures (Signed)
Lumbar Puncture Procedure Note  Pre-operative Diagnosis: altered mental status   Post-operative Diagnosis: altered mental status  Indications: Diagnostic  Procedure Details   Consent: Informed consent was obtained. Risks of the procedure were discussed including: infection, bleeding, pain and headache.  The patient was positioned right lateral decubitus under sterile conditions. Betadine solution and sterile drapes were utilized. A spinal needle was inserted at the L3 - L4 interspace.  Spinal fluid was obtained after a single pass and sent to the laboratory for analysis. No heme or paraesthesias on needle pass.  Findings 34mL of straw colored spinal fluid was obtained. Opening Pressure: was not obtained at Neurologist request.   Complications:  None; patient tolerated the procedure well.        Condition: stable  Oleta Mouse, MD 03/04/2014 3:24 PM

## 2014-03-04 NOTE — Progress Notes (Signed)
eMD initall arrival eval  [facility] Lab Alert - Anemia Evaluation  Pertinent Labs   Recent Labs Lab 03/02/14 1048 03/04/14 0545  HGB 12.7 11.0*    Recent Labs Lab 03/02/14 1048  INR 1.01    Recent Labs Lab 03/02/14 1048 03/04/14 0545  PLT 275 246     No results for input(s): TROPONINI in the last 168 hours.  Recent Labs Lab 03/02/14 1048  INR 1.01     IMPRESSION   Anemia of Critical Illness with/without MI or Chest pain rule out MI based on </=  2gm% Hgb drop in 24h, no visible signs of hemorrhage,shock explained by non-hemorrhagic causes,       ICD-9-CM ICD-10-CM   1. Altered mental status, unspecified altered mental status type 780.97 R41.82   2. Cough 786.2 R05 DG Chest 2 View     DG Chest 2 View  3. Acute cystitis without hematuria 595.0 N30.00   4. Headache 784.0 R51 MR Brain W Wo Contrast     CANCELED: MR Brain Wo Contrast     CANCELED: MR Brain Wo Contrast     CANCELED: MR Brain Wo Contrast     CANCELED: MR Brain Wo Contrast  5. Meningitis 322.9 G03.9 DG Fluoro Guide Ndl Plc/BX     DG Fluoro Guide Ndl Plc/BX     CANCELED: DG Fluoro Guide Ndl Plc/BX     CANCELED: DG Fluoro Guide Ndl Plc/BX  6. Aneurysm 442.9 I72.9 CANCELED: CT Angio Head W/Cm &/Or Wo Cm     CANCELED: CT Angio Head W/Cm &/Or Wo Cm  7. Subarachnoid hemorrhage due to ruptured aneurysm 430 I60.8 IR ANGIO INTRA EXTRACRAN SEL COM CAROTID INNOMINATE BILAT MOD SED     IR ANGIO INTRA EXTRACRAN SEL COM CAROTID INNOMINATE BILAT MOD SED  8. Other vascular headache 784.0 G44.1 MR Brain W Wo Contrast     Camera exam - sleeping. Not intubated.   Vitals stable   PULMONARY No results for input(s): PHART, PCO2ART, PO2ART, HCO3, TCO2, O2SAT in the last 168 hours.  Invalid input(s): PCO2, PO2  CBC  Recent Labs Lab 03/02/14 1048 03/04/14 0545  HGB 12.7 11.0*  HCT 38.1 33.3*  WBC 9.0 10.0  PLT 275 246    COAGULATION  Recent Labs Lab 03/02/14 1048  INR 1.01    CARDIAC  No  results for input(s): TROPONINI in the last 168 hours. No results for input(s): PROBNP in the last 168 hours.   CHEMISTRY  Recent Labs Lab 03/02/14 1048 03/04/14 0545  NA 135 131*  K 4.0 3.7  CL 100 101  CO2 26 24  GLUCOSE 100* 95  BUN 17 12  CREATININE 0.75 0.66  CALCIUM 9.2 7.6*   Estimated Creatinine Clearance: 48.7 mL/min (by C-G formula based on Cr of 0.66).   LIVER  Recent Labs Lab 03/02/14 1048  AST 21  ALT 12  ALKPHOS 51  BILITOT 0.7  PROT 6.8  ALBUMIN 4.0  INR 1.01     INFECTIOUS  Recent Labs Lab 03/02/14 1132  LATICACIDVEN 0.67     ENDOCRINE CBG (last 3)   Recent Labs  03/02/14 1042  GLUCAP 111*         IMAGING x48h Mr Kizzie Fantasia Contrast  03/04/2014   CLINICAL DATA:  Mental status changes. The vascular headache. Meningitis versus encephalopathy.  EXAM: MRI HEAD WITHOUT AND WITH CONTRAST  TECHNIQUE: Multiplanar, multiecho pulse sequences of the brain and surrounding structures were obtained without and with intravenous contrast.  CONTRAST:  17mL MULTIHANCE GADOBENATE DIMEGLUMINE 529 MG/ML IV SOLN  COMPARISON:  CT head without contrast 03/02/2014.  FINDINGS: A linear area of diffusion signal is noted within the right sylvian fissure on imaged 20 and 21 of series 3. This is noted on the coronal images is well. Parenchymal restricted diffusion is present.  Moderate periventricular and scattered subcortical T2 hyperintensities are present bilaterally. Ventricles are mildly enlarged. There is mild generalized atrophy.  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 The area of calcification noted on the CT is associated with this aneurysm and may represent a partially thrombosed portion of the aneurysm. Recommend MRA or CTA for additional evaluation. A small meningioma adjacent to the aneurysm is still considered.  Flow is otherwise normal. The postcontrast  images demonstrate no pathologic enhancement.  Bilateral lens extractions are noted. The globes and orbits are otherwise intact. A fluid level is present in the sphenoid sinus. Mild mucosal thickening is present throughout the ethmoid air cells. The remaining paranasal sinuses and the mastoid air cells are clear.  IMPRESSION: 1. Focal diffusion signal abnormality within the right sylvian fissure. This appears extra-axial. This could be related to meningitis. A thrombosed vein is considered less likely without significant focal signal abnormality on other sequences. No other mass lesion or enhancement is present. 2. Focal extension from the right posterior communicating artery compatible with any elongated aneurysm, measuring up to 11 mm. Recommend MRA or CTA for further evaluation. 3. Moderate atrophy and diffuse white matter disease. This likely reflects the sequelae of chronic microvascular ischemia. 4. Dilation of the lateral ventricles is likely related to the atrophy without significant hydrocephalus. 5. Mild ethmoid sinus disease.   Electronically Signed   By: Sharon [name] M.D.   On: 03/04/2014 15:21       A) Gr3 SAH  P No eICU intervention for the moment  Dr. Brand Males, M.D., [facility].C.P Pulmonary and Critical Care Medicine Staff Physician [facility] Pager: [phone], If no answer or between  15:00h - 7:00h: call [phone]  03/04/2014 9:31 PM

## 2014-03-04 NOTE — Progress Notes (Signed)
Patient was transferred to 4G92 by MD order. RN called and gave report; patient was transported to 48M by Advice worker. Family at bedside.

## 2014-03-04 NOTE — Progress Notes (Signed)
Band aid at LP site CDI with very sm amt of shadowing noted. OK to tx back to rm per Dr Ermalene Postin

## 2014-03-04 NOTE — Consult Note (Signed)
Reason for Consult: Subarachnoid hemorrhage Referring Physician: [name] is an 79 y.o. female.  HPI: 79 year old female with 2 week history of increasing headaches and associated confusion/mental status changes. Patient without history of definite sudden onset headache. No history of trauma. Patient recently admitted for workup of encephalopathy. Initial findings worrisome for urinary tract infection. 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 No obvious subarachnoid hemorrhage on the scan. Patient did have an LP performed which demonstrates evidence of significant subarachnoid blood mixed with xanthochromic fluid. MRI scan demonstrates moderate ventriculomegaly. No evidence of infarction.  Past Medical History  Diagnosis Date  . Dyslipidemia   . Cystitis 03/02/2014    acute   . UTI (urinary tract infection) 03/02/2014  . Arthritis     osteo    Past Surgical History  Procedure Laterality Date  . Abdominal hysterectomy      Family History  Problem Relation Age of Onset  . Hypertension Mother   . Hypertension Father     Social History:  reports that she has been smoking Cigarettes.  She has been smoking about 0.50 packs per day. She has never used smokeless tobacco. She reports that she does not drink alcohol or use illicit drugs.  Allergies: No Known Allergies  Medications: I have reviewed the patient's current medications.  Results for orders placed or performed during the hospital encounter of 03/02/14 (from the past 48 hour(s))  Culture, blood (routine x 2)     Status: None (Preliminary result)   Collection Time: 03/03/14  1:51 AM  Result Value Ref Range   Specimen Description BLOOD LEFT HAND    Special Requests BOTTLES DRAWN AEROBIC ONLY 6CC    Culture             BLOOD CULTURE RECEIVED NO GROWTH TO DATE CULTURE WILL BE HELD FOR 5 DAYS BEFORE ISSUING A FINAL NEGATIVE  REPORT Performed at [facility]    Report Status PENDING   Culture, blood (routine x 2)     Status: None (Preliminary result)   Collection Time: 03/03/14  8:22 AM  Result Value Ref Range   Specimen Description BLOOD LEFT ARM    Special Requests BOTTLES DRAWN AEROBIC AND ANAEROBIC 10CC    Culture             BLOOD CULTURE RECEIVED NO GROWTH TO DATE CULTURE WILL BE HELD FOR 5 DAYS BEFORE ISSUING A FINAL NEGATIVE REPORT Performed at [facility]    Report Status PENDING   CBC     Status: Abnormal   Collection Time: 03/04/14  5:45 AM  Result Value Ref Range   WBC 10.0 4.0 - 10.5 K/uL   RBC 3.53 (L) 3.87 - 5.11 MIL/uL   Hemoglobin 11.0 (L) 12.0 - 15.0 Sharon/dL   HCT 33.3 (L) 36.0 - 46.0 %   MCV 94.3 78.0 - 100.0 fL   MCH 31.2 26.0 - 34.0 pg   MCHC 33.0 30.0 - 36.0 Sharon/dL   RDW 12.4 11.5 - 15.5 %   Platelets 246 150 - 400 K/uL  Basic metabolic panel     Status: Abnormal   Collection Time: 03/04/14  5:45 AM  Result Value Ref Range   Sodium 131 (L) 135 - 145 mmol/L    Comment: Please note change in reference range.   Potassium 3.7 3.5 - 5.1 mmol/L    Comment: Please note change in reference range.   Chloride 101 96 -  112 mEq/L   CO2 24 19 - 32 mmol/L   Glucose, Bld 95 70 - 99 mg/dL   BUN 12 6 - 23 mg/dL   Creatinine, Ser 0.66 0.50 - 1.10 mg/dL   Calcium 7.6 (L) 8.4 - 10.5 mg/dL   GFR calc non Af Amer 83 (L) >90 mL/min   GFR calc Af Amer >90 >90 mL/min    Comment: (NOTE) The eGFR has been calculated using the CKD EPI equation. This calculation has not been validated in all clinical situations. eGFR's persistently <90 mL/min signify possible Chronic Kidney Disease.    Anion gap 6 5 - 15  Gram stain     Status: None   Collection Time: 03/04/14  3:00 PM  Result Value Ref Range   Specimen Description CSF    Special Requests CSF FLUID NO 2 3.5CC    Gram Stain      CYTOSPIN PREP WBC PRESENT,BOTH PMN AND MONONUCLEAR NO ORGANISMS SEEN    Report Status 03/04/2014  FINAL   CSF cell count with differential     Status: Abnormal   Collection Time: 03/04/14  3:00 PM  Result Value Ref Range   Tube # 1    Color, CSF AMBER (A) COLORLESS   Appearance, CSF CLOUDY (A) CLEAR   Supernatant XANTHOCHROMIC    RBC Count, CSF 7200 (H) 0 /cu mm   WBC, CSF 194 (HH) 0 - 5 /cu mm    Comment: CRITICAL RESULT CALLED TO, READ BACK BY AND VERIFIED WITH: Sharon SCALCO,RN 03/04/2014 1703 Sharon [name]    Segmented Neutrophils-CSF 23 (H) 0 - 6 %   Lymphs, CSF 69 40 - 80 %   Monocyte-Macrophage-Spinal Fluid 7 (L) 15 - 45 %   Eosinophils, CSF 1 0 - 1 %  Protein and glucose, CSF     Status: Abnormal   Collection Time: 03/04/14  3:00 PM  Result Value Ref Range   Glucose, CSF 36 (L) 43 - 76 mg/dL   Total  Protein, CSF 84 (H) 15 - 45 mg/dL  CSF cell count with differential     Status: Abnormal   Collection Time: 03/04/14  3:00 PM  Result Value Ref Range   Tube # 4    Color, CSF AMBER (A) COLORLESS   Appearance, CSF CLOUDY (A) CLEAR   Supernatant XANTHOCHROMIC    RBC Count, CSF 7800 (H) 0 /cu mm   WBC, CSF 158 (HH) 0 - 5 /cu mm    Comment: CRITICAL RESULT CALLED TO, READ BACK BY AND VERIFIED WITH: Sharon SCALCO,RN 03/04/2014 1703 Sharon [name]    Segmented Neutrophils-CSF 26 (H) 0 - 6 %   Lymphs, CSF 67 40 - 80 %   Monocyte-Macrophage-Spinal Fluid 6 (L) 15 - 45 %   Eosinophils, CSF 1 0 - 1 %    Mr Sharon [name] Wo Contrast  03/04/2014   CLINICAL DATA:  Mental status changes. The vascular headache. Meningitis versus encephalopathy.  EXAM: MRI HEAD WITHOUT AND WITH CONTRAST  TECHNIQUE: Multiplanar, multiecho pulse sequences of the brain and surrounding structures were obtained without and with intravenous contrast.  CONTRAST:  6m MULTIHANCE GADOBENATE DIMEGLUMINE 529 MG/ML IV SOLN  COMPARISON:  CT head without contrast 03/02/2014.  FINDINGS: A linear area of diffusion signal is noted within the right sylvian fissure on imaged 20 and 21 of series 3. This is noted on the coronal images is well.  Parenchymal restricted diffusion is present.  Moderate periventricular and scattered subcortical T2 hyperintensities are present bilaterally. Ventricles  are mildly enlarged. There is mild generalized atrophy.  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 The area of calcification noted on the CT is associated with this aneurysm and may represent a partially thrombosed portion of the aneurysm. Recommend MRA or CTA for additional evaluation. A small meningioma adjacent to the aneurysm is still considered.  Flow is otherwise normal. The postcontrast images demonstrate no pathologic enhancement.  Bilateral lens extractions are noted. The globes and orbits are otherwise intact. A fluid level is present in the sphenoid sinus. Mild mucosal thickening is present throughout the ethmoid air cells. The remaining paranasal sinuses and the mastoid air cells are clear.  IMPRESSION: 1. Focal diffusion signal abnormality within the right sylvian fissure. This appears extra-axial. This could be related to meningitis. A thrombosed vein is considered less likely without significant focal signal abnormality on other sequences. No other mass lesion or enhancement is present. 2. Focal extension from the right posterior communicating artery compatible with any elongated aneurysm, measuring up to 11 mm. Recommend MRA or CTA for further evaluation. 3. Moderate atrophy and diffuse white matter disease. This likely reflects the sequelae of chronic microvascular ischemia. 4. Dilation of the lateral ventricles is likely related to the atrophy without significant hydrocephalus. 5. Mild ethmoid sinus disease.   Electronically Signed   By: Lawrence Santiago Sharon.D.   On: 03/04/2014 15:21    Review of systems not obtained due to patient factors. Blood pressure 145/63, pulse 75, temperature 100.4 F (38 C), temperature source Oral, resp. rate 19, height 5' 3"   (1.6 Sharon), weight 54.88 kg (120 lb 15.8 oz), SpO2 95 %. The patient is somnolent but she awakens easily to voice. She converses but is confused and is disoriented as to place and time. Her speech is fluent. Cranial nerve function demonstrates normal pupillary size and normal reactivity bilaterally. Her gaze is conjugate. Extraocular movements are full. Her face has symmetric motor and sensory function. Tongue protrudes to midline. Palate elevates to midline. Shoulder shrug equally. Motor examination of her extremities reveals 5/5 strength bilaterally without any evidence of pronator drift. She has no evidence of sensory abnormalities. Cerebellar testing was not performed. Reflexes were normal. Examination head ears eyes and throat is unremarkable. There is no evidence of trauma. Examination her neck revealed some mild rigidity. Airways midline. Carotid pulses are normal. Chest and abdomen are benign. Extremities are free from injury or deformity.  Assessment/Plan: The patient has a grade 3 subarachnoid hemorrhage likely secondary to subclinical aneurysmal hemorrhage on one or more different occasions over the past few weeks. The patient has meningeal irritation secondary to the subarachnoid blood and blood breakdown products. She has elements of communicating hydrocephalus but does not have any evidence of dangerous increased intracranial pressure. Plan is to perform arteriography this evening with possible interventional radiology coiling of this aneurysm. I would place the patient on nimodipine and Decadron. I would continue ICU observation. I would aggressively hydrate the patient with intravenous fluids. I do not think that an external ventricular drain is appropriate at this time.  Sharon [name] A 03/04/2014, 8:11 PM

## 2014-03-04 NOTE — Progress Notes (Signed)
INITIAL NUTRITION ASSESSMENT  DOCUMENTATION CODES Per approved criteria  -Not Applicable   INTERVENTION: Continue Ensure Complete po BID, each supplement provides 350 kcal and 13 grams of protein.  Encourage adequate PO intake.  NUTRITION DIAGNOSIS: Inadequate oral intake related to confusion, decreased appetite as evidenced by meal completion of 0-10%.   Goal: Pt to meet >/= 90% of their estimated nutrition needs   Monitor:  PO intake, weight trends, labs, I/O's  Reason for Assessment: MST  79 y.o. female  Admitting Dx: Acute encephalopathy  ASSESSMENT: Pt with history of dyslipidemia, presented to the ED with ultra mental status. Patient apparently experienced an episode of headache associated with weakness and three day history of confusion and transient fever.  12/31:Procedure: Lumbar puncture  Pt was in procedure during attempted time of visit. No visitors/family present. Unable to obtain nutrition hx or perform nutrition focused physical exam at this time. Per Epic weight records, with with a 5.5% weight loss in 4 months, however is not found significant. RD to continue with current ordered of Ensure Complete BID.  Labs: Low sodium, calcium, and GFR.  Height: Ht Readings from Last 1 Encounters:  03/02/14 5\' 3"  (1.6 m)    Weight: Wt Readings from Last 1 Encounters:  03/02/14 120 lb 15.8 oz (54.88 kg)    Ideal Body Weight: 115 lbs  % Ideal Body Weight: 104%  Wt Readings from Last 10 Encounters:  03/02/14 120 lb 15.8 oz (54.88 kg)  10/11/13 127 lb (57.607 kg)    Usual Body Weight: 127 lbs  % Usual Body Weight: 945  BMI:  Body mass index is 21.44 kg/(m^2).  Estimated Nutritional Needs: Kcal: 1650-1850 Protein: 65-75 grams Fluid: 1.65- 1.85 L/day  Skin: no issues noted  Diet Order: Diet Heart  EDUCATION NEEDS: -No education needs identified at this time   Intake/Output Summary (Last 24 hours) at 03/04/14 1259 Last data filed at 03/04/14  0602  Gross per 24 hour  Intake 1851.25 ml  Output      0 ml  Net 1851.25 ml    Last BM: 12/30  Labs:   Recent Labs Lab 03/02/14 1048 03/04/14 0545  NA 135 131*  K 4.0 3.7  CL 100 101  CO2 26 24  BUN 17 12  CREATININE 0.75 0.66  CALCIUM 9.2 7.6*  GLUCOSE 100* 95    CBG (last 3)   Recent Labs  03/02/14 1042  GLUCAP 111*    Scheduled Meds: . acyclovir  500 mg Intravenous Q12H  . ampicillin (OMNIPEN) IV  2 g Intravenous 6 times per day  . aspirin EC  81 mg Oral Daily  . cefTRIAXone (ROCEPHIN)  IV  2 g Intravenous Q12H  . feeding supplement (ENSURE COMPLETE)  237 mL Oral BID BM  . haloperidol lactate      . simvastatin  20 mg Oral q1800  . vancomycin  1,000 mg Intravenous Q24H    Continuous Infusions: . sodium chloride 75 mL/hr at 03/03/14 2024    Past Medical History  Diagnosis Date  . Dyslipidemia   . Cystitis 03/02/2014    acute   . UTI (urinary tract infection) 03/02/2014  . Arthritis     osteo    Past Surgical History  Procedure Laterality Date  . Abdominal hysterectomy      Kallie Locks, MS, RD, LDN Pager # [phone] After hours/ weekend pager # [phone]

## 2014-03-04 NOTE — Anesthesia Preprocedure Evaluation (Addendum)
Anesthesia Evaluation  Patient identified by MRN, date of birth, ID band Patient confused    Reviewed: Allergy & Precautions, H&P , NPO status , Patient's Chart, lab work & pertinent test results  Airway Mallampati: II  TM Distance: >3 FB Neck ROM: Full    Dental no notable dental hx. (+) Teeth Intact, Dental Advisory Given   Pulmonary Current Smoker,  breath sounds clear to auscultation  Pulmonary exam normal       Cardiovascular negative cardio ROS  Rhythm:Regular Rate:Normal     Neuro/Psych  Headaches, negative psych ROS   GI/Hepatic negative GI ROS, Neg liver ROS,   Endo/Other  negative endocrine ROS  Renal/GU negative Renal ROS  negative genitourinary   Musculoskeletal   Abdominal   Peds  Hematology negative hematology ROS (+)   Anesthesia Other Findings   Reproductive/Obstetrics negative OB ROS                            Anesthesia Physical Anesthesia Plan  ASA: II and emergent  Anesthesia Plan: General   Post-op Pain Management:    Induction: Intravenous, Rapid sequence and Cricoid pressure planned  Airway Management Planned: Oral ETT  Additional Equipment: Arterial line  Intra-op Plan:   Post-operative Plan: Post-operative intubation/ventilation  Informed Consent: I have reviewed the patients History and Physical, chart, labs and discussed the procedure including the risks, benefits and alternatives for the proposed anesthesia with the patient or authorized representative who has indicated his/her understanding and acceptance.   Dental advisory given  Plan Discussed with: CRNA  Anesthesia Plan Comments:         Anesthesia Quick Evaluation

## 2014-03-04 NOTE — Anesthesia Preprocedure Evaluation (Addendum)
Anesthesia Evaluation  Patient identified by MRN, date of birth, ID band Patient confused    Reviewed: Allergy & Precautions, H&P , NPO status , Patient's Chart, lab work & pertinent test results  History of Anesthesia Complications Negative for: history of anesthetic complications  Airway Mallampati: II  TM Distance: >3 FB Neck ROM: Full    Dental  (+) Edentulous Upper, Edentulous Lower, Dental Advisory Given   Pulmonary Current Smoker,  breath sounds clear to auscultation        Cardiovascular Rhythm:Regular     Neuro/Psych AMS, on empiric ABx for viral and bacterial meningitis    GI/Hepatic   Endo/Other    Renal/GU      Musculoskeletal   Abdominal   Peds  Hematology   Anesthesia Other Findings   Reproductive/Obstetrics                            Anesthesia Physical Anesthesia Plan  ASA: III  Anesthesia Plan: General   Post-op Pain Management:    Induction: Intravenous  Airway Management Planned: Oral ETT  Additional Equipment: None  Intra-op Plan:   Post-operative Plan: Extubation in OR and Possible Post-op intubation/ventilation  Informed Consent: I have reviewed the patients History and Physical, chart, labs and discussed the procedure including the risks, benefits and alternatives for the proposed anesthesia with the patient or authorized representative who has indicated his/her understanding and acceptance.   Dental advisory given and Consent reviewed with POA  Plan Discussed with: CRNA, Anesthesiologist and Surgeon  Anesthesia Plan Comments:        Anesthesia Quick Evaluation

## 2014-03-04 NOTE — Progress Notes (Addendum)
Subjective: No change  Objective: Current vital signs: BP 110/60 mmHg  Pulse 73  Temp(Src) 98.3 F (36.8 C) (Oral)  Resp 18  Ht 5\' 3"  (1.6 m)  Wt 54.88 kg (120 lb 15.8 oz)  BMI 21.44 kg/m2  SpO2 92% Vital signs in last 24 hours: Temp:  [98.3 F (36.8 C)-98.7 F (37.1 C)] 98.3 F (36.8 C) (12/31 0552) Resp:  [18] 18 (12/31 0552) BP: (110-143)/(60-113) 110/60 mmHg (12/31 0614) SpO2:  [92 %-96 %] 92 % (12/31 0552)  Intake/Output from previous day: 12/30 0701 - 12/31 0700 In: 1851.3 [P.O.:100; I.V.:1241.3; IV Piggyback:510] Out: 50 [Emesis/NG output:50] Intake/Output this shift:   Nutritional status: Diet Heart  Neurologic Exam: Mental Status: Alert, not oriented to place, year, month. Speech fluent with expressive aphasia. She is able to follow my commands. Cranial Nerves: II: Discs flat bilaterally; Visual fields grossly normal, pupils equal, round, reactive to light and accommodation III,IV, VI: ptosis not present, extra-ocular motions intact bilaterally V,VII: smile symmetric, facial light touch sensation normal bilaterally VIII: hearing normal bilaterally IX,X: gag reflex present XI: bilateral shoulder shrug XII: midline tongue extension Motor: Right :Upper extremity 5/5Left: Upper extremity 5/5 Lower extremity 5/5Lower extremity 5/5 Tone and bulk:normal tone throughout; no atrophy noted Sensory: Pinprick and light touch intact throughout, bilaterally Deep Tendon Reflexes: 2+ and symmetric throughout UE and no KJ or AJ bilaterally Plantars: Right: downgoingLeft: downgoing Cerebellar: normal finger-to-nose, normal heel-to-shin test Gait: not tested due to confusion.   Lab Results: Basic Metabolic Panel:  Recent Labs Lab 03/02/14 1048 03/04/14 0545  NA 135 131*  K 4.0 3.7  CL 100 101  CO2 26 24  GLUCOSE  100* 95  BUN 17 12  CREATININE 0.75 0.66  CALCIUM 9.2 7.6*    Liver Function Tests:  Recent Labs Lab 03/02/14 1048  AST 21  ALT 12  ALKPHOS 51  BILITOT 0.7  PROT 6.8  ALBUMIN 4.0   No results for input(s): LIPASE, AMYLASE in the last 168 hours. No results for input(s): AMMONIA in the last 168 hours.  CBC:  Recent Labs Lab 03/02/14 1048 03/04/14 0545  WBC 9.0 10.0  NEUTROABS 6.5  --   HGB 12.7 11.0*  HCT 38.1 33.3*  MCV 94.8 94.3  PLT 275 246    Cardiac Enzymes: No results for input(s): CKTOTAL, CKMB, CKMBINDEX, TROPONINI in the last 168 hours.  Lipid Panel: No results for input(s): CHOL, TRIG, HDL, CHOLHDL, VLDL, LDLCALC in the last 168 hours.  CBG:  Recent Labs Lab 03/02/14 1042  GLUCAP 111*    Microbiology: Results for orders placed or performed during the hospital encounter of 03/02/14  Urine culture     Status: None   Collection Time: 03/02/14 11:40 AM  Result Value Ref Range Status   Specimen Description URINE, CLEAN CATCH  Final   Special Requests ADDED [id number]  Final   Colony Count   Final    >=100,000 COLONIES/ML Performed at [facility]    Culture   Final    Multiple bacterial morphotypes present, none predominant. Suggest appropriate recollection if clinically indicated. Performed at [facility]    Report Status 03/04/2014 FINAL  Final  Culture, blood (routine x 2)     Status: None (Preliminary result)   Collection Time: 03/03/14  1:51 AM  Result Value Ref Range Status   Specimen Description BLOOD LEFT HAND  Final   Special Requests BOTTLES DRAWN AEROBIC ONLY [facility]  Final   Culture   Final  BLOOD CULTURE RECEIVED NO GROWTH TO DATE CULTURE WILL BE HELD FOR 5 DAYS BEFORE ISSUING A FINAL NEGATIVE REPORT Performed at [facility]    Report Status PENDING  Incomplete  Culture, blood (routine x 2)     Status: None (Preliminary result)   Collection Time: 03/03/14  8:22 AM  Result Value Ref Range  Status   Specimen Description BLOOD LEFT ARM  Final   Special Requests BOTTLES DRAWN AEROBIC AND ANAEROBIC 10CC  Final   Culture   Final           BLOOD CULTURE RECEIVED NO GROWTH TO DATE CULTURE WILL BE HELD FOR 5 DAYS BEFORE ISSUING A FINAL NEGATIVE REPORT Performed at [facility]    Report Status PENDING  Incomplete    Coagulation Studies:  Recent Labs  03/02/14 1048  LABPROT 13.4  INR 1.01    Imaging: Dg Chest 2 View  03/02/2014   CLINICAL DATA:  Cough; confusion  EXAM: CHEST  2 VIEW  COMPARISON:  Jul 04, 2010  FINDINGS: There is no edema or consolidation. Heart size and pulmonary vascularity are normal. No adenopathy. There is lower thoracic and upper lumbar levoscoliosis. There is degenerative change in the thoracic spine.  IMPRESSION: No edema or consolidation.   Electronically Signed   By: Lowella Grip M.D.   On: 03/02/2014 13:03   Ct Head Wo Contrast  03/02/2014   CLINICAL DATA:  Confusion, altered mental status, temporal headache.  EXAM: CT HEAD WITHOUT CONTRAST  TECHNIQUE: Contiguous axial images were obtained from the base of the skull through the vertex without intravenous contrast.  COMPARISON:  None.  FINDINGS: Mild centralized volume loss with commensurate ex vacuo dilatation of the ventricular system. Scattered periventricular hypodensities compatible with microvascular ischemic disease. No CT evidence acute large territory infarct. No intraparenchymal or extra-axial hemorrhage.  There is a punctate (approximately 0.7 cm) calcification within the medial aspect of the right middle cranial fossa (image 7, series 202) which is incompletely evaluated though potentially representative of a small meningioma. Otherwise, no intraparenchymal or extra-axial mass. Normal configuration of the ventricles and basilar cisterns. No midline shift. Intracranial atherosclerosis.  Limited visualization of the paranasal sinuses and mastoid air cells are normal. No air-fluid levels.  Post bilateral cataract surgery. Regional soft tissues appear normal. No displaced calvarial fracture.  IMPRESSION: 1. Mild centralized volume loss and microvascular ischemic disease without acute intracranial process. 2. Indeterminate punctate calcification within the medial aspect of the right middle cranial fossa nonspecific though potentially representing a small meningioma. Further evaluation could be performed with MRI as clinically indicated.   Electronically Signed   By: Sandi Mariscal M.D.   On: 03/02/2014 12:05    Medications:  Scheduled: . acyclovir  500 mg Intravenous Q12H  . ampicillin (OMNIPEN) IV  2 g Intravenous 6 times per day  . aspirin EC  81 mg Oral Daily  . cefTRIAXone (ROCEPHIN)  IV  2 g Intravenous Q12H  . feeding supplement (ENSURE COMPLETE)  237 mL Oral BID BM  . haloperidol lactate      . simvastatin  20 mg Oral q1800  . vancomycin  1,000 mg Intravenous Q24H    Assessment/Plan:  79 YO female with progressive three day history of confusion and transient fever. Thus far UA only shows leukocytes and UC/ BC pending. Patient does not appear toxic and exam shows normal findings with no meningismus.I reviewed patient CT brain and there is not evidence of acute intracranial abnormality or other lesions  that could explain her presentation. Etiology of confusion unclear at this time. As she does not appear toxic and has no neck stiffness, would like to obtain MRI. LP was attempted but unable to obtain due to patient agitation. Patient will need LP to be done under anesthesia and fluoroscopy.  MRI will need to be done under anesthesia also.   Recommend: 1) Continue ABX and Acyclovir 2) Ordered MRI brain. BC as per primary team 3) LP to be done under anesthesia.    Sharon Quill PA-C [facility] [phone]  03/04/2014, 11:02 AM

## 2014-03-04 NOTE — Progress Notes (Signed)
CRITICAL VALUE ALERT  Critical value received:  WBC tube 1=194; tube 2=158  Date of notification:  03/05/2015  Time of notification:  7893  Critical value read back:Yes.    Nurse who received alert:  Alphonsus Sias  MD notified (1st page):  Dr. Cruzita Lederer  Time of first page:  1707  MD notified (2nd page):  Time of second page:  Responding MD: MD aware; Patient receiving treatment  Time MD responded

## 2014-03-04 NOTE — Progress Notes (Addendum)
PROGRESS NOTE  Sharon [name] TDD:[id number] DOB: 21-May-1934 DOA: 03/02/2014 PCP: Osborne Casco, MD  HPI: 79 y.o. female with history of dyslipidemia, presented to the ED with altered mental status. Patient is unable to provide any history secondary to altered mental status  Subjective / 24 H Interval events - continues to be confused, more so during nighttime  Assessment/Plan: Principal Problem:   Acute encephalopathy Active Problems:   Headache   UTI (lower urinary tract infection)   Dyslipidemia   Addendum 7 pm: patient with MRI findings of PCA aneurysm and LP worrisome of subarachnoid hemorrhage with elevated RBCs in tubes 1 and 4. I discussed case with IR, Neurosurgery and Neurology. She will be transferred to Neuro ICU with plans for arteriography tonight. Family updated.  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  - obtain CTA to further evaluate - unlikely to cause her mental status changes  UTI - patient without any UTI-type symptoms - cultures final with multiple morphologies so unlikely to cause her mental status changes  HLD - continue statin   Diet: Diet Heart Fluids: NS DVT Prophylaxis: Lovenox, now SCD  Code Status: Full Code Family Communication: d/w boyfriend  Disposition Plan: remain inpatient   Consultants:  Neurology   Procedures:  None    Antibiotics Ceftriaxone 12/29 >> Vancomycin 12/30 >> Acyclovir 12/30 >> Ampicillin 12/30 >>  Objective  Filed Vitals:   03/04/14 1500 03/04/14 1503 03/04/14 1509 03/04/14 1512  BP: 126/52 112/59  118/47  Pulse: 94  89 89  Temp: 98.2 F (36.8 C)     TempSrc:      Resp: 17  16 15   Height:      Weight:      SpO2: 96%  100% 100%     Intake/Output Summary (Last 24 hours) at 03/04/14 1637 Last data filed at 03/04/14 1430  Gross per 24 hour  Intake 1751.25 ml  Output      0 ml  Net 1751.25 ml   Filed Weights   03/02/14 1030 03/02/14 1610  Weight: 54.885 kg (121 lb) 54.88 kg (120 lb 15.8 oz)   Exam:  General:  NAD, pleasant however with confusion  Cardiovascular: RRR  Respiratory: CTA biL, no wheezing   Abdomen: soft, non tender  MSK: no clubbing  Neuro: non focal  Data Reviewed: Basic Metabolic Panel:  Recent Labs Lab 03/02/14 1048 03/04/14 0545  NA 135 131*  K 4.0 3.7  CL 100 101  CO2 26 24  GLUCOSE 100* 95  BUN 17 12  CREATININE 0.75 0.66  CALCIUM 9.2 7.6*   Liver Function Tests:  Recent Labs Lab 03/02/14 1048  AST 21  ALT 12  ALKPHOS 51  BILITOT 0.7  PROT 6.8  ALBUMIN 4.0   CBC:  Recent Labs Lab 03/02/14 1048 03/04/14 0545  WBC 9.0 10.0  NEUTROABS 6.5  --   HGB 12.7 11.0*  HCT 38.1 33.3*  MCV 94.8 94.3  PLT 275 246   CBG:  Recent Labs Lab 03/02/14 1042  GLUCAP 111*   Scheduled Meds: . acyclovir  500 mg Intravenous Q12H  . ampicillin (OMNIPEN) IV  2 g Intravenous 6 times per day  . aspirin EC  81 mg Oral Daily  .  cefTRIAXone (ROCEPHIN)  IV  2 g Intravenous Q12H  . feeding supplement (ENSURE COMPLETE)  237 mL Oral BID BM  . haloperidol lactate      . simvastatin  20 mg Oral q1800  . vancomycin  1,000 mg Intravenous Q24H   Continuous Infusions: . sodium chloride 75 mL/hr at 03/03/14 2024   Time spent: 60 minutes, coordinating care with neurology, IR, Neurosurgery,  nursing staff and more than 50% time involved in family discussions.   Marzetta Board, MD [facility] Pager [phone]. If 7 PM - 7 AM, please contact night-coverage at [email or web address], password [facility] 03/04/2014, 4:37 PM  LOS: 2 days

## 2014-03-04 NOTE — Clinical Documentation Improvement (Signed)
PLEASE SPECIFY TYPE & ACUITY OF CHF: Possible Clinical Conditions?  Chronic Systolic Congestive Heart Failure Chronic Diastolic Congestive Heart Failure Chronic Systolic & Diastolic Congestive Heart Failure Acute Systolic Congestive Heart Failure Acute Diastolic Congestive Heart Failure Acute Systolic & Diastolic Congestive Heart Failure Acute on Chronic Systolic Congestive Heart Failure Acute on Chronic Diastolic Congestive Heart Failure Acute on Chronic Systolic & Diastolic Congestive Heart Failure Other Condition Cannot Clinically Determine  Supporting Information:(As per notes) CHF (congestive heart failure)   Thank You, Alessandra Grout, RN, BSN, CCDS,Clinical Documentation Specialist:  [phone]  [phone]=[employer] [place]- Health Information Management

## 2014-03-04 NOTE — Transfer of Care (Signed)
Immediate Anesthesia Transfer of Care Note  Patient: Sharon [name]  Procedure(s) Performed: Procedure(s): RADIOLOGY WITH ANESTHESIA (N/A)  Patient Location: PACU  Anesthesia Type:General  Level of Consciousness: awake  Airway & Oxygen Therapy: Patient Spontanous Breathing and Patient connected to nasal cannula oxygen  Post-op Assessment: Report given to PACU RN, Post -op Vital signs reviewed and stable and Patient moving all extremities X 4  Post vital signs: Reviewed and stable  Complications: No apparent anesthesia complications

## 2014-03-05 ENCOUNTER — Inpatient Hospital Stay (HOSPITAL_COMMUNITY): Payer: Medicare Other

## 2014-03-05 DIAGNOSIS — I609 Nontraumatic subarachnoid hemorrhage, unspecified: Secondary | ICD-10-CM

## 2014-03-05 LAB — CBC WITH DIFFERENTIAL/PLATELET
BASOS ABS: 0 10*3/uL (ref 0.0–0.1)
Basophils Relative: 0 % (ref 0–1)
Eosinophils Absolute: 0 10*3/uL (ref 0.0–0.7)
Eosinophils Relative: 0 % (ref 0–5)
HEMATOCRIT: 33.5 % — AB (ref 36.0–46.0)
HEMOGLOBIN: 11.4 g/dL — AB (ref 12.0–15.0)
LYMPHS ABS: 0.6 10*3/uL — AB (ref 0.7–4.0)
LYMPHS PCT: 6 % — AB (ref 12–46)
MCH: 32.3 pg (ref 26.0–34.0)
MCHC: 34 g/dL (ref 30.0–36.0)
MCV: 94.9 fL (ref 78.0–100.0)
MONO ABS: 0.2 10*3/uL (ref 0.1–1.0)
Monocytes Relative: 2 % — ABNORMAL LOW (ref 3–12)
NEUTROS PCT: 92 % — AB (ref 43–77)
Neutro Abs: 8.8 10*3/uL — ABNORMAL HIGH (ref 1.7–7.7)
PLATELETS: 246 10*3/uL (ref 150–400)
RBC: 3.53 MIL/uL — ABNORMAL LOW (ref 3.87–5.11)
RDW: 12.3 % (ref 11.5–15.5)
WBC: 9.5 10*3/uL (ref 4.0–10.5)

## 2014-03-05 LAB — COMPREHENSIVE METABOLIC PANEL
ALK PHOS: 41 U/L (ref 39–117)
ALT: 11 U/L (ref 0–35)
AST: 16 U/L (ref 0–37)
Albumin: 2.8 g/dL — ABNORMAL LOW (ref 3.5–5.2)
Anion gap: 7 (ref 5–15)
BILIRUBIN TOTAL: 0.6 mg/dL (ref 0.3–1.2)
BUN: 11 mg/dL (ref 6–23)
CALCIUM: 7.4 mg/dL — AB (ref 8.4–10.5)
CO2: 23 mmol/L (ref 19–32)
Chloride: 105 mEq/L (ref 96–112)
Creatinine, Ser: 0.63 mg/dL (ref 0.50–1.10)
GFR calc Af Amer: >90 mL/min (ref >90)
GFR calc non Af Amer: 84 mL/min — ABNORMAL LOW (ref >90)
Glucose, Bld: 140 mg/dL — ABNORMAL HIGH (ref 70–99)
POTASSIUM: 3.5 mmol/L (ref 3.5–5.1)
Sodium: 135 mmol/L (ref 135–145)
TOTAL PROTEIN: 5.3 g/dL — AB (ref 6.0–8.3)

## 2014-03-05 LAB — TRIGLYCERIDES: Triglycerides: 77 mg/dL (ref <150)

## 2014-03-05 LAB — BLOOD GAS, ARTERIAL
ACID-BASE DEFICIT: 2.2 mmol/L — AB (ref 0.0–2.0)
Bicarbonate: 21.9 mEq/L (ref 20.0–24.0)
DRAWN BY: 31276
FIO2: 0.4 %
MECHVT: 500 mL
O2 SAT: 98.5 %
PATIENT TEMPERATURE: 98.6
PEEP: 5 cmH2O
PH ART: 7.396 (ref 7.350–7.450)
RATE: 14 resp/min
TCO2: 23 mmol/L (ref 0–100)
pCO2 arterial: 36.5 mmHg (ref 35.0–45.0)
pO2, Arterial: 128 mmHg — ABNORMAL HIGH (ref 80.0–100.0)

## 2014-03-05 LAB — GLUCOSE, CAPILLARY: Glucose-Capillary: 129 mg/dL — ABNORMAL HIGH (ref 70–99)

## 2014-03-05 LAB — POCT ACTIVATED CLOTTING TIME: Activated Clotting Time: 140 seconds

## 2014-03-05 MED ORDER — SODIUM CHLORIDE 0.9 % IV SOLN
25.0000 ug/h | INTRAVENOUS | Status: DC
  Administered 2014-03-05: 50 ug/h via INTRAVENOUS
  Filled 2014-03-05 (×2): qty 50

## 2014-03-05 MED ORDER — MIDAZOLAM HCL 2 MG/2ML IJ SOLN
1.0000 mg | INTRAMUSCULAR | Status: DC | PRN
  Administered 2014-03-05: 2 mg via INTRAVENOUS
  Administered 2014-03-05: 4 mg via INTRAVENOUS
  Administered 2014-03-06 (×2): 2 mg via INTRAVENOUS
  Filled 2014-03-05: qty 4
  Filled 2014-03-05 (×3): qty 2

## 2014-03-05 MED ORDER — HEPARIN SODIUM (PORCINE) 1000 UNIT/ML IJ SOLN
INTRAMUSCULAR | Status: DC | PRN
  Administered 2014-03-05: 500 [IU] via INTRAVENOUS
  Administered 2014-03-05 (×2): 1000 [IU] via INTRAVENOUS
  Administered 2014-03-05: 500 [IU] via INTRAVENOUS

## 2014-03-05 MED ORDER — PANTOPRAZOLE SODIUM 40 MG IV SOLR
40.0000 mg | Freq: Every day | INTRAVENOUS | Status: DC
  Administered 2014-03-05: 40 mg via INTRAVENOUS
  Filled 2014-03-05 (×2): qty 40

## 2014-03-05 MED ORDER — ACETAMINOPHEN 650 MG RE SUPP: 650.0000 mg | Freq: Four times a day (QID) | RECTAL | Status: DC | PRN

## 2014-03-05 MED ORDER — VITAL HIGH PROTEIN PO LIQD
1000.0000 mL | ORAL | Status: DC
  Administered 2014-03-05: 1000 mL
  Administered 2014-03-06 (×2)
  Filled 2014-03-05 (×3): qty 1000

## 2014-03-05 MED ORDER — FENTANYL CITRATE 0.05 MG/ML IJ SOLN: 25.0000 ug | INTRAMUSCULAR | Status: DC | PRN

## 2014-03-05 MED ORDER — FENTANYL CITRATE 0.05 MG/ML IJ SOLN
50.0000 ug | INTRAMUSCULAR | Status: DC | PRN
  Administered 2014-03-05 (×4): 50 ug via INTRAVENOUS
  Filled 2014-03-05 (×4): qty 2

## 2014-03-05 MED ORDER — CHLORHEXIDINE GLUCONATE 0.12 % MT SOLN
15.0000 mL | Freq: Two times a day (BID) | OROMUCOSAL | Status: DC
  Administered 2014-03-05 – 2014-03-06 (×3): 15 mL via OROMUCOSAL
  Filled 2014-03-05 (×2): qty 15

## 2014-03-05 MED ORDER — ONDANSETRON HCL 4 MG/2ML IJ SOLN: 4.0000 mg | Freq: Four times a day (QID) | INTRAMUSCULAR | Status: DC | PRN

## 2014-03-05 MED ORDER — CETYLPYRIDINIUM CHLORIDE 0.05 % MT LIQD
7.0000 mL | Freq: Four times a day (QID) | OROMUCOSAL | Status: DC
  Administered 2014-03-05 – 2014-03-06 (×4): 7 mL via OROMUCOSAL

## 2014-03-05 MED ORDER — SODIUM CHLORIDE 0.9 % IV SOLN: INTRAVENOUS | Status: DC

## 2014-03-05 MED ORDER — PROPOFOL 10 MG/ML IV EMUL
0.0000 ug/kg/min | INTRAVENOUS | Status: DC
  Administered 2014-03-05 (×2): 50 ug/kg/min via INTRAVENOUS
  Administered 2014-03-05: 15 ug/kg/min via INTRAVENOUS
  Filled 2014-03-05 (×2): qty 100

## 2014-03-05 MED ORDER — NIMODIPINE 60 MG/20ML PO SOLN
30.0000 mg | ORAL | Status: DC
  Administered 2014-03-05 – 2014-03-06 (×8): 30 mg via ORAL
  Filled 2014-03-05 (×14): qty 10

## 2014-03-05 MED ORDER — ACETAMINOPHEN 500 MG PO TABS
1000.0000 mg | ORAL_TABLET | Freq: Four times a day (QID) | ORAL | Status: DC | PRN
Start: 2014-03-05 — End: 2014-03-06

## 2014-03-05 MED ORDER — NICARDIPINE HCL IN NACL 20-0.86 MG/200ML-% IV SOLN: 5.0000 mg/h | INTRAVENOUS | Status: DC

## 2014-03-05 NOTE — Progress Notes (Signed)
Patient ID: Sharon [name], female   DOB: 1935/01/07, 80 y.o.   MRN: [id number] Subjective:  The patient is sedated. She is in no apparent distress. I spoke with her daughter.  Objective: Vital signs in last 24 hours: Temp:  [97 F (36.1 C)-100.4 F (38 C)] 97 F (36.1 C) (01/01 0800) Pulse Rate:  [62-94] 66 (01/01 0700) Resp:  [14-22] 14 (01/01 0700) BP: (90-145)/(47-79) 90/49 mmHg (01/01 0700) SpO2:  [95 %-100 %] 100 % (01/01 0700) Arterial Line BP: (108-166)/(45-64) 117/63 mmHg (01/01 0700) FiO2 (%):  [40 %] 40 % (01/01 0320) Weight:  [57.7 kg (127 lb 3.3 oz)] 57.7 kg (127 lb 3.3 oz) (01/01 0500)  Intake/Output from previous day: 12/31 0701 - 01/01 0700 In: 2703.3 [I.V.:2653.3; IV Piggyback:50] Out: 2100 [Urine:2050; Blood:50] Intake/Output this shift:    Physical exam the patient is heavily sedated. By report she moves all 4 extremities when not sedated. Her pupils are approximately 2 mm bilaterally.  The patient's follow-up head CT scan demonstrates no significant change except for the aneurysm coiling. She has mild ventriculomegaly unchanged from her preoperative scan.  Lab Results:  Recent Labs  03/04/14 0545 03/05/14 0500  WBC 10.0 9.5  HGB 11.0* 11.4*  HCT 33.3* 33.5*  PLT 246 246   BMET  Recent Labs  03/04/14 0545 03/05/14 0500  NA 131* 135  K 3.7 3.5  CL 101 105  CO2 24 23  GLUCOSE 95 140*  BUN 12 11  CREATININE 0.66 0.63  CALCIUM 7.6* 7.4*    Studies/Results: Ct Head Wo Contrast  03/05/2014   CLINICAL DATA:  Subarachnoid hemorrhage. Follow-up interventional procedure.  EXAM: CT HEAD WITHOUT CONTRAST  TECHNIQUE: Contiguous axial images were obtained from the base of the skull through the vertex without intravenous contrast.  COMPARISON:  03/02/2014  FINDINGS: Changes of aneurysm coiling in the right proximal MCA territory. Contrast material within the blood vessels from recent endovascular procedure. No acute infarction. No visible hemorrhage. No  hydrocephalus. Chronic microvascular changes throughout the deep white matter.  Visualized paranasal sinuses and mastoids clear. Orbital soft tissues unremarkable.  IMPRESSION: Interval aneurysm coiling in the right MCA territory. No visible infarct or acute hemorrhage.   Electronically Signed   By: Rolm Baptise M.D.   On: 03/05/2014 07:22   Mr Jeri Cos UL Contrast  03/04/2014   CLINICAL DATA:  Mental status changes. The vascular headache. Meningitis versus encephalopathy.  EXAM: MRI HEAD WITHOUT AND WITH CONTRAST  TECHNIQUE: Multiplanar, multiecho pulse sequences of the brain and surrounding structures were obtained without and with intravenous contrast.  CONTRAST:  21mL MULTIHANCE GADOBENATE DIMEGLUMINE 529 MG/ML IV SOLN  COMPARISON:  CT head without contrast 03/02/2014.  FINDINGS: A linear area of diffusion signal is noted within the right sylvian fissure on imaged 20 and 21 of series 3. This is noted on the coronal images is well. Parenchymal restricted diffusion is present.  Moderate periventricular and scattered subcortical T2 hyperintensities are present bilaterally. Ventricles are mildly enlarged. There is mild generalized atrophy.  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 The area of calcification noted on the CT is associated with this aneurysm and may represent a partially thrombosed portion of the aneurysm. Recommend MRA or CTA for additional evaluation. A small meningioma adjacent to the aneurysm is still considered.  Flow is otherwise normal. The postcontrast images demonstrate no pathologic enhancement.  Bilateral lens extractions are noted. The  globes and orbits are otherwise intact. A fluid level is present in the sphenoid sinus. Mild mucosal thickening is present throughout the ethmoid air cells. The remaining paranasal sinuses and the mastoid air cells are clear.  IMPRESSION: 1. Focal diffusion  signal abnormality within the right sylvian fissure. This appears extra-axial. This could be related to meningitis. A thrombosed vein is considered less likely without significant focal signal abnormality on other sequences. No other mass lesion or enhancement is present. 2. Focal extension from the right posterior communicating artery compatible with any elongated aneurysm, measuring up to 11 mm. Recommend MRA or CTA for further evaluation. 3. Moderate atrophy and diffuse white matter disease. This likely reflects the sequelae of chronic microvascular ischemia. 4. Dilation of the lateral ventricles is likely related to the atrophy without significant hydrocephalus. 5. Mild ethmoid sinus disease.   Electronically Signed   By: Lawrence Santiago M.D.   On: 03/04/2014 15:21   Portable Chest Xray  03/05/2014   CLINICAL DATA:  Acute respiratory failure  EXAM: PORTABLE CHEST - 1 VIEW  COMPARISON:  03/02/2014  FINDINGS: Endotracheal tube tip is just below the clavicular heads.  The nasogastric tube tip overlaps the stomach on prior KUB, but the intrathoracic course is abnormally lateral. It is reassuring that the midportion of the tube projects left lateral of the left mainstem bronchus, arguing against a transbronchial placement, but the exact course cannot be established on this study. If clinical signs of placement are uncertain, further imaging or careful replacement is recommended. These results were called by telephone at the time of interpretation on 03/05/2014 at 4:05 am to RN [place], who verbally acknowledged these results.  There is a linear opacity in the retrocardiac lung consistent with atelectasis.  IMPRESSION: 1. Endotracheal tube is in good position. 2. Abnormal course of the orogastric tube, as discussed above. If clinical signs of gastric placement are not certain, further imaging or replacement (with prompt followup imaging) is recommended. 3. Retrocardiac atelectasis.   Electronically Signed   By: Jorje Guild M.D.   On: 03/05/2014 04:08   Dg Abd Portable 1v  03/05/2014   CLINICAL DATA:  Orogastric tube placement.  EXAM: PORTABLE ABDOMEN - 1 VIEW  COMPARISON:  None.  FINDINGS: The nasogastric tube tip overlaps the stomach, but the intra thoracic coarse is abnormally lateral. The tube may be intraluminal, but the exact course cannot be established on this study. If clinical signs of placement are uncertain, further imaging or careful replacement is recommended. These results were called by telephone at the time of interpretation on 03/05/2014 at 3:58 am to RN [place], who verbally acknowledged these results.  The bowel gas pattern is nonobstructed.  Left lower lobe atelectasis.  IMPRESSION: The nasogastric tube tip overlaps the stomach, but the intrathoracic course is abnormally lateral. The tube may be intraluminal, but the exact course cannot be established on this study. If clinical signs of placement are uncertain, further imaging or careful replacement is recommended.   Electronically Signed   By: Jorje Guild M.D.   On: 03/05/2014 04:01    Assessment/Plan: Aneurysmal subarachnoid hemorrhage: The patient's follow-up scan looks good. Her ventilator is being managed by critical care.  LOS: 3 days     Sheniya Garciaperez D 03/05/2014, 8:08 AM

## 2014-03-05 NOTE — Progress Notes (Signed)
Pt has been restless all day. Will not calm unless in fetal position in middle of bed. MD aware. Planned to use pain medication more and cut back on sedation because pt holding head and nods yes to pain. Fentanyl gtt started but unable to get off propofol. Pt cont to be restless and attempted to pull ETT. MD notified and versed prn ordered. Pt attempting to get OOB. Versed given. Will cont to monitor.

## 2014-03-05 NOTE — Sedation Documentation (Signed)
Family updated as to patient's status.

## 2014-03-05 NOTE — Transfer of Care (Signed)
Immediate Anesthesia Transfer of Care Note  Patient: Sharon [name]  Procedure(s) Performed: Procedure(s): RADIOLOGY WITH ANESTHESIA (N/A)  Patient Location: ICU  Anesthesia Type:General  Level of Consciousness: sedated, unresponsive and Patient remains intubated per anesthesia plan  Airway & Oxygen Therapy: Patient remains intubated per anesthesia plan and Patient placed on Ventilator (see vital sign flow sheet for setting)  Post-op Assessment: Report given to PACU RN and Post -op Vital signs reviewed and stable  Post vital signs: Reviewed and stable  Pupils 2+, PEERLA  Complications: No apparent anesthesia complications

## 2014-03-05 NOTE — Consult Note (Signed)
PULMONARY / CRITICAL CARE MEDICINE   Name: Sharon [name] MRN: [id number] DOB: 03-16-1934    ADMISSION DATE:  03/02/2014 CONSULTATION DATE:  03/05/14  REFERRING MD :  Dr. Garen Grams  REASON FOR CONSULTATION: Ventilator-dependent respiratory failure  INITIAL PRESENTATION: 80 year old woman with progressive altered mental status admitted 12/29. Initial eval revealed possible UTI. LP showed xanthochromia and evidence of probable subarachnoid hemorrhage. 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 She is status post posterior communicating artery aneurysmal coiling on 03/05/14. She returns to the ICU post procedure on mechanical ventilation.   STUDIES:  Head CT 12/29 >> indeterminate punctate calcification in the medial aspect of the right middle cranial fossa, possibly small meningioma LP 12/31 > xanthochromic, 7.8K RBC, 158 WBC (41 neutrophils). Glucose 36. Total protein 84. 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 Also noted on angiography was a left internal carotid artery aneurysm  HISTORY OF PRESENT ILLNESS:  80 year old woman, active smoker with a history of arthritis. She began to have headache and possibly some associated weakness dating back to the end of November. Since that time she had experienced some increased somnolence and off-and-on headache. Then 12/28 she was noted to be disoriented. She was admitted 12/29 for neurological evaluation. Lumbar puncture was consistent with subarachnoid bleeding. 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 She underwent coiling of the aneurysm on  03/05/14. Post procedure she was admitted to the ICU on mechanical ventilation.   PAST MEDICAL HISTORY :   has a past medical history of Dyslipidemia; Cystitis (03/02/2014); UTI (urinary tract infection) (03/02/2014); and Arthritis.  has past surgical history that includes Abdominal hysterectomy. Prior to Admission medications   Medication Sig Start Date End Date Taking? Authorizing Provider  aspirin EC 81 MG tablet Take 81 mg by mouth daily.   Yes Historical Provider, MD  cholecalciferol (VITAMIN D) 1000 UNITS tablet Take 1,000 Units by mouth daily.   Yes Historical Provider, MD  ibuprofen (ADVIL,MOTRIN) 200 MG tablet Take 200 mg by mouth every 6 (six) hours as needed.   Yes Historical Provider, MD  Minoxidil (ROGAINE EX) Apply 1 application topically daily.   Yes Historical Provider, MD  naproxen sodium (ANAPROX) 220 MG tablet Take 220 mg by mouth 2 (two) times daily with a meal.   Yes Historical Provider, MD  Omega-3 Fatty Acids (FISH OIL PO) Take 1 tablet by mouth daily.   Yes Historical Provider, MD  RENOVA 0.02 % CREA Apply 1 application topically daily. 01/12/14  Yes Historical Provider, MD  simvastatin (ZOCOR) 20 MG tablet Take 1 tablet by mouth daily. 02/07/14  Yes Historical Provider, MD  vitamin E 100 UNIT capsule Take 100 Units by mouth daily.   Yes Historical Provider, MD  ondansetron (ZOFRAN ODT) 4 MG disintegrating tablet Take 1 tablet (4 mg total) by mouth every 8 (eight) hours as needed for nausea or vomiting. 10/11/13   Sherian Maroon, MD   No Known Allergies  FAMILY HISTORY:  has no family status information on file.  SOCIAL HISTORY:  reports that she has been smoking Cigarettes.  She has been smoking about 0.50 packs per day. She has never used smokeless tobacco. She reports that she  does not drink alcohol or use illicit drugs.  REVIEW OF SYSTEMS:  Unable to obtain  SUBJECTIVE:  Patient is sedated and unresponsive  VITAL SIGNS: Temp:  [98.2 F (36.8 C)-100.4 F (38 C)]  99.4 F (37.4 C) (12/31 2130) Pulse Rate:  [75-94] 76 (12/31 2200) Resp:  [14-22] 21 (12/31 2200) BP: (110-145)/(47-113) 112/54 mmHg (12/31 2200) SpO2:  [92 %-100 %] 95 % (12/31 2200) HEMODYNAMICS:   VENTILATOR SETTINGS:   INTAKE / OUTPUT:  Intake/Output Summary (Last 24 hours) at 03/05/14 0151 Last data filed at 03/05/14 0149  Gross per 24 hour  Intake 2070.42 ml  Output   1600 ml  Net 470.42 ml    PHYSICAL EXAMINATION: General: no dstress Neuro:  comatose HEENT:  Pupils pin point Cardiovascular:  regular Lungs:  No wheeze Abdomen:  Soft, non tender Musculoskeletal:  No edema Skin:  No rashes  LABS:  CBC  Recent Labs Lab 03/02/14 1048 03/04/14 0545  WBC 9.0 10.0  HGB 12.7 11.0*  HCT 38.1 33.3*  PLT 275 246   Coag's  Recent Labs Lab 03/02/14 1048  INR 1.01   BMET  Recent Labs Lab 03/02/14 1048 03/04/14 0545  NA 135 131*  K 4.0 3.7  CL 100 101  CO2 26 24  BUN 17 12  CREATININE 0.75 0.66  GLUCOSE 100* 95   Electrolytes  Recent Labs Lab 03/02/14 1048 03/04/14 0545  CALCIUM 9.2 7.6*   Sepsis Markers  Recent Labs Lab 03/02/14 1132  LATICACIDVEN 0.67   ABG No results for input(s): PHART, PCO2ART, PO2ART in the last 168 hours.   Liver Enzymes  Recent Labs Lab 03/02/14 1048  AST 21  ALT 12  ALKPHOS 51  BILITOT 0.7  ALBUMIN 4.0   Cardiac Enzymes No results for input(s): TROPONINI, PROBNP in the last 168 hours.   Glucose  Recent Labs Lab 03/02/14 1042  GLUCAP 111*    Imaging Mr Jeri Cos BJ Contrast  03/04/2014   CLINICAL DATA:  Mental status changes. The vascular headache. Meningitis versus encephalopathy.  EXAM: MRI HEAD WITHOUT AND WITH CONTRAST  TECHNIQUE: Multiplanar, multiecho pulse sequences of the brain and surrounding structures were obtained without and with intravenous contrast.  CONTRAST:  17mL MULTIHANCE GADOBENATE DIMEGLUMINE 529 MG/ML IV SOLN  COMPARISON:  CT head without contrast 03/02/2014.  FINDINGS: A  linear area of diffusion signal is noted within the right sylvian fissure on imaged 20 and 21 of series 3. This is noted on the coronal images is well. Parenchymal restricted diffusion is present.  Moderate periventricular and scattered subcortical T2 hyperintensities are present bilaterally. Ventricles are mildly enlarged. There is mild generalized atrophy.  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 The area of calcification noted on the CT is associated with this aneurysm and may represent a partially thrombosed portion of the aneurysm. Recommend MRA or CTA for additional evaluation. A small meningioma adjacent to the aneurysm is still considered.  Flow is otherwise normal. The postcontrast images demonstrate no pathologic enhancement.  Bilateral lens extractions are noted. The globes and orbits are otherwise intact. A fluid level is present in the sphenoid sinus. Mild mucosal thickening is present throughout the ethmoid air cells. The remaining paranasal sinuses and the mastoid air cells are clear.  IMPRESSION: 1. Focal diffusion signal abnormality within the right sylvian fissure. This appears extra-axial. This could be related to meningitis. A thrombosed vein is considered less likely  without significant focal signal abnormality on other sequences. No other mass lesion or enhancement is present. 2. Focal extension from the right posterior communicating artery compatible with any elongated aneurysm, measuring up to 11 mm. Recommend MRA or CTA for further evaluation. 3. Moderate atrophy and diffuse white matter disease. This likely reflects the sequelae of chronic microvascular ischemia. 4. Dilation of the lateral ventricles is likely related to the atrophy without significant hydrocephalus. 5. Mild ethmoid sinus disease.   Electronically Signed   By: Lawrence Santiago M.D.   On: 03/04/2014 15:21     ASSESSMENT /  PLAN:  PULMONARY ETT 03/05/14 >>  A:  Acute respiratory failure post procedure. History of tobacco use. P:   Full vent support until mental status more stable F/u CXR, ABG  CARDIOVASCULAR A:  Hx of HLD. P:  Continue zocor  RENAL A:   Mild hyponatremia. P:   Monitor renal fx, urine outpt NS IV fluid  GASTROINTESTINAL A:   Nutrition. P:   Tube feeds if unable to extubate soon Protonix for SUP  HEMATOLOGIC A:   DVT prophylaxis. P:  SCD  INFECTIOUS A:   Fever with ?menigitis >> seems unlikely. P:   BCx2 12/29 >>  UC 12/29 >> 100k multiple morphocytes CSF 12/31 >>   Ceftriaxone 12/30 >> 1/01 Ampicillin 12/30 >> 1/01 Acyclovir 12/30 >> 1/01 Vancomycin 12/31 >> 1/01  ENDOCRINE A:   No acute issues. P:   Monitor blood sugar on BMET  NEUROLOGIC A:   Acute encephalopathy 2nd to grade 3 SAH. P:   RASS goal: -1 Per neurology, neurosurgery, neuro-IR  No family at bedside 03/05/14  - Inter-disciplinary family meet or Palliative Care meeting due by 03/12/14:  CC time 35 minutes.  Chesley Mires, MD [facility] Pulmonary/Critical Care 03/05/2014, 2:52 AM Pager:  [phone] After 3pm call: [phone]

## 2014-03-05 NOTE — Procedures (Signed)
S/P 4 vessel cerebral arteriogram. RT CFA approach. Findings. 1.36mm x 5 mm irreg bilobed PCOM region aneurysm ,s/p near complete obliteration with primary coiling. 2.apro 4.57mm x 2.2  Mm wide neck LT ICA post wall intracranial aneurysm. 3. No angio evidence of vasospasm at this time

## 2014-03-05 NOTE — Progress Notes (Signed)
[facility]  I have reviewed the record  S: failed SBT due to agitation O:  Filed Vitals:   03/05/14 1131 03/05/14 1200 03/05/14 1300 03/05/14 1400  BP:  113/59 150/66 106/55  Pulse:  80 61 55  Temp: 97 F (36.1 C)     TempSrc: Axillary     Resp:  14 14 14   Height:      Weight:      SpO2:  100% 100% 100%   Gen: off sedation, fetal position, does not follow commands HEENT: NCAT ETT PULM: CTA B CV: RRR, no mgr AB: BS+, soft, nontender Ext: warm no edema Neuro: intermittently nods head to questions, moves all four ext, does not follow commands  1/1 CXR images reviewed> lungs clear, ETT in place  Impression: 1) SAH s/p coiling 2) Vent dependent post IR procedure > lung mechanics and oxygenation OK 3) Acute encephalopathy> presumably med related, has failed WUA x2 due to agitation 4) headache due to #1  Plan: 1) Likely extubate in AM 2) continue to wean sedation as able, use just enough for vent synchrony and comfort 3) change to fentanyl for better pain control as she appears to be in pain 4) add tube feedings  Additional CC time by me 45 minutes  Roselie Awkward, MD [place] [facility] Pager: [phone] Cell: [phone] If no response, call [phone]

## 2014-03-05 NOTE — Sedation Documentation (Signed)
Bedside report given to Nunzio Cobbs, RN in neuro ICU.  Right groin C/D/I with no signs of bleeding or hematoma at this time.

## 2014-03-05 NOTE — Anesthesia Postprocedure Evaluation (Signed)
  Anesthesia Post-op Note  Patient: Sharon [name]  Procedure(s) Performed: Procedure(s): RADIOLOGY WITH ANESTHESIA (N/A)  Patient Location: ICU  Anesthesia Type:General  Level of Consciousness: sedated and unresponsive  Airway and Oxygen Therapy: Patient remains intubated and on ventilator  Post-op Pain: none  Post-op Assessment: Post-op Vital signs reviewed, Patient's Cardiovascular Status Stable and Respiratory Function Stable  Post-op Vital Signs: Reviewed  Filed Vitals:   03/05/14 0630  BP: 108/59  Pulse: 62  Temp:   Resp: 14    Complications: No apparent anesthesia complications

## 2014-03-06 ENCOUNTER — Inpatient Hospital Stay (HOSPITAL_COMMUNITY): Payer: Medicare Other

## 2014-03-06 DIAGNOSIS — J9600 Acute respiratory failure, unspecified whether with hypoxia or hypercapnia: Secondary | ICD-10-CM

## 2014-03-06 DIAGNOSIS — I609 Nontraumatic subarachnoid hemorrhage, unspecified: Secondary | ICD-10-CM | POA: Diagnosis present

## 2014-03-06 LAB — CBC
HCT: 31.9 % — ABNORMAL LOW (ref 36.0–46.0)
Hemoglobin: 10.9 g/dL — ABNORMAL LOW (ref 12.0–15.0)
MCH: 32.9 pg (ref 26.0–34.0)
MCHC: 34.2 g/dL (ref 30.0–36.0)
MCV: 96.4 fL (ref 78.0–100.0)
PLATELETS: 236 10*3/uL (ref 150–400)
RBC: 3.31 MIL/uL — AB (ref 3.87–5.11)
RDW: 12.8 % (ref 11.5–15.5)
WBC: 13 10*3/uL — AB (ref 4.0–10.5)

## 2014-03-06 LAB — BASIC METABOLIC PANEL
Anion gap: 4 — ABNORMAL LOW (ref 5–15)
BUN: 13 mg/dL (ref 6–23)
CALCIUM: 6.7 mg/dL — AB (ref 8.4–10.5)
CO2: 23 mmol/L (ref 19–32)
Chloride: 111 mEq/L (ref 96–112)
Creatinine, Ser: 0.59 mg/dL (ref 0.50–1.10)
GFR calc non Af Amer: 86 mL/min — ABNORMAL LOW (ref >90)
Glucose, Bld: 137 mg/dL — ABNORMAL HIGH (ref 70–99)
POTASSIUM: 4 mmol/L (ref 3.5–5.1)
Sodium: 138 mmol/L (ref 135–145)

## 2014-03-06 LAB — GLUCOSE, CAPILLARY
GLUCOSE-CAPILLARY: 126 mg/dL — AB (ref 70–99)
Glucose-Capillary: 124 mg/dL — ABNORMAL HIGH (ref 70–99)

## 2014-03-06 MED ORDER — PANTOPRAZOLE SODIUM 40 MG PO PACK
40.0000 mg | PACK | Freq: Every day | ORAL | Status: DC
  Administered 2014-03-07: 40 mg via ORAL
  Filled 2014-03-06 (×3): qty 20

## 2014-03-06 MED ORDER — NIMODIPINE 60 MG/20ML PO SOLN
60.0000 mg | ORAL | Status: DC
  Administered 2014-03-06 – 2014-03-13 (×43): 60 mg via ORAL
  Filled 2014-03-06 (×50): qty 20

## 2014-03-06 MED ORDER — CALCIUM GLUCONATE 10 % IV SOLN
1.0000 g | Freq: Once | INTRAVENOUS | Status: AC
  Administered 2014-03-06: 1 g via INTRAVENOUS
  Filled 2014-03-06: qty 10

## 2014-03-06 MED ORDER — SODIUM CHLORIDE 0.9 % IV BOLUS (SEPSIS)
500.0000 mL | Freq: Once | INTRAVENOUS | Status: AC
  Administered 2014-03-06: 500 mL via INTRAVENOUS

## 2014-03-06 MED ORDER — ENSURE COMPLETE PO LIQD
237.0000 mL | Freq: Two times a day (BID) | ORAL | Status: DC
  Administered 2014-03-07 – 2014-03-09 (×4): 237 mL via ORAL

## 2014-03-06 MED ORDER — PANTOPRAZOLE SODIUM 40 MG PO PACK
40.0000 mg | PACK | Freq: Every day | ORAL | Status: DC
  Filled 2014-03-06: qty 20

## 2014-03-06 MED ORDER — CETYLPYRIDINIUM CHLORIDE 0.05 % MT LIQD
7.0000 mL | Freq: Two times a day (BID) | OROMUCOSAL | Status: DC
  Administered 2014-03-06 – 2014-03-11 (×10): 7 mL via OROMUCOSAL

## 2014-03-06 MED ORDER — DEXMEDETOMIDINE HCL IN NACL 200 MCG/50ML IV SOLN: 0.4000 ug/kg/h | INTRAVENOUS | Status: DC

## 2014-03-06 NOTE — Procedures (Signed)
Extubation Procedure Note  Patient Details:   Name: Sharon [name] DOB: 1934-04-13 MRN: [id number]   Airway Documentation:     Evaluation  O2 sats: stable throughout Complications: No apparent complications Patient did tolerate procedure well. Bilateral Breath Sounds: Clear, Diminished Suctioning: Oral Yes   Pt tolerated wean, positive for cuff leak, extubated to 4L . No stridor or dyspnea noted after extubation. RT will continue to monitor.  Mariam Dollar 03/06/2014, 9:38 AM

## 2014-03-06 NOTE — Evaluation (Signed)
Clinical/Bedside Swallow Evaluation Patient Details  Name: Sharon [name] MRN: [id number] Date of Birth: November 22, 1934  Today's Date: 03/06/2014 Time: 1110-1141 SLP Time Calculation (min) (ACUTE ONLY): 31 min  Past Medical History:  Past Medical History  Diagnosis Date  . Dyslipidemia   . Cystitis 03/02/2014    acute   . UTI (urinary tract infection) 03/02/2014  . Arthritis     osteo   Past Surgical History:  Past Surgical History  Procedure Laterality Date  . Abdominal hysterectomy     HPI:  80 year old female admitted 02/20/14 due to AMS, aneurysmal SAH. PMH significant for DLD. CXR negative, CT indicated nothing acute. MRI revealed Focal extension from the right posterior communicating artery compatible with any elongated aneurysm, measuring up to 11 mm.   Assessment / Plan / Recommendation Clinical Impression  Oral care completed with suction. Pt trying to hold on to swab during oral care. Pt was given several boluses of a variety of consistencies, and was noted to tolerate all presentations without overt s/s aspiration. Pt appears safe to begin po meds, and po intake. Recommend beginning with soft diet/chopped meats, thin liquids and advance as tolerated. ST to follow for diet tolerance. Recommend completion of SLE due to apparent cognitive impairment. and level of independence prior to admit.    Aspiration Risk  Mild    Diet Recommendation Dysphagia 3 (Mechanical Soft);Thin liquid   Liquid Administration via: Straw;Cup Medication Administration: Whole meds with puree Supervision: Patient able to self feed;Full supervision/cueing for compensatory strategies Compensations: Slow rate;Small sips/bites Postural Changes and/or Swallow Maneuvers: Seated upright 90 degrees;Upright 30-60 min after meal    Other  Recommendations Oral Care Recommendations: Oral care BID Other Recommendations: Clarify dietary restrictions   Follow Up Recommendations  24 hour supervision/assistance     Frequency and Duration min 2x/week  2 weeks   Pertinent Vitals/Pain None reported    SLP Swallow Goals  diet tolerance   Swallow Study Prior Functional Status   no prior history of swallowing difficulty.    General Date of Onset: 03/02/14 HPI: 80 year old female admitted 02/20/14 due to AMS, aneurysmal SAH. PMH significant for DLD. CXR negative, CT indicated nothing acute. MRI revealed Focal extension from the right posterior communicating artery compatible with any elongated aneurysm, measuring up to 11 mm. Type of Study: Bedside swallow evaluation Previous Swallow Assessment: none Diet Prior to this Study: NPO Temperature Spikes Noted: No Respiratory Status: Nasal cannula History of Recent Intubation: Yes Length of Intubations (days): 1 days Date extubated: 03/06/14 Behavior/Cognition: Alert;Cooperative;Confused;Distractible;Requires cueing;Impulsive Oral Cavity - Dentition: Dentures, not available;Missing dentition Self-Feeding Abilities: Needs assist Patient Positioning: Upright in bed Baseline Vocal Quality: Clear Volitional Cough: Strong Volitional Swallow: Able to elicit    Oral/Motor/Sensory Function Overall Oral Motor/Sensory Function: Appears within functional limits for tasks assessed   Ice Chips Ice chips: Within functional limits Presentation: Spoon   Thin Liquid Thin Liquid: Within functional limits Presentation: Straw;Cup    Nectar Thick Nectar Thick Liquid: Not tested   Honey Thick Honey Thick Liquid: Not tested   Puree Puree: Within functional limits Presentation: [place] [facility]. Quentin O[facility], CCC-SLP [phone] Solid: Within functional limits Presentation: [place], Fredirick [place] 03/06/2014,11:51 AM

## 2014-03-06 NOTE — Progress Notes (Signed)
Subjective: Patient reports Intubated and sedated  Objective: Vital signs in last 24 hours: Temp:  [97 F (36.1 C)-98 F (36.7 C)] 97.9 F (36.6 C) (01/02 0752) Pulse Rate:  [55-107] 66 (01/02 0700) Resp:  [5-27] 14 (01/02 0700) BP: (73-150)/(43-81) 129/81 mmHg (01/02 0700) SpO2:  [96 %-100 %] 100 % (01/02 0700) Arterial Line BP: (121-157)/(57-125) 144/57 mmHg (01/01 1300) FiO2 (%):  [40 %] 40 % (01/02 0600) Weight:  [61.4 kg (135 lb 5.8 oz)] 61.4 kg (135 lb 5.8 oz) (01/02 0500)  Intake/Output from previous day: 01/01 0701 - 01/02 0700 In: 3961 [I.V.:3226.3; NG/GT:234.7; IV Piggyback:500] Out: 8315 [Urine:1550] Intake/Output this shift:    Awakens moves everything does not follow commands  Lab Results:  Recent Labs  03/05/14 0500 03/06/14 0138  WBC 9.5 13.0*  HGB 11.4* 10.9*  HCT 33.5* 31.9*  PLT 246 236   BMET  Recent Labs  03/05/14 0500 03/06/14 0138  NA 135 138  K 3.5 4.0  CL 105 111  CO2 23 23  GLUCOSE 140* 137*  BUN 11 13  CREATININE 0.63 0.59  CALCIUM 7.4* 6.7*    Studies/Results: Ct Head Wo Contrast  03/05/2014   CLINICAL DATA:  Subarachnoid hemorrhage. Follow-up interventional procedure.  EXAM: CT HEAD WITHOUT CONTRAST  TECHNIQUE: Contiguous axial images were obtained from the base of the skull through the vertex without intravenous contrast.  COMPARISON:  03/02/2014  FINDINGS: Changes of aneurysm coiling in the right proximal MCA territory. Contrast material within the blood vessels from recent endovascular procedure. No acute infarction. No visible hemorrhage. No hydrocephalus. Chronic microvascular changes throughout the deep white matter.  Visualized paranasal sinuses and mastoids clear. Orbital soft tissues unremarkable.  IMPRESSION: Interval aneurysm coiling in the right MCA territory. No visible infarct or acute hemorrhage.   Electronically Signed   By: Rolm Baptise M.D.   On: 03/05/2014 07:22   Mr Jeri Cos VV Contrast  03/04/2014   CLINICAL  DATA:  Mental status changes. The vascular headache. Meningitis versus encephalopathy.  EXAM: MRI HEAD WITHOUT AND WITH CONTRAST  TECHNIQUE: Multiplanar, multiecho pulse sequences of the brain and surrounding structures were obtained without and with intravenous contrast.  CONTRAST:  51mL MULTIHANCE GADOBENATE DIMEGLUMINE 529 MG/ML IV SOLN  COMPARISON:  CT head without contrast 03/02/2014.  FINDINGS: A linear area of diffusion signal is noted within the right sylvian fissure on imaged 20 and 21 of series 3. This is noted on the coronal images is well. Parenchymal restricted diffusion is present.  Moderate periventricular and scattered subcortical T2 hyperintensities are present bilaterally. Ventricles are mildly enlarged. There is mild generalized atrophy.  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 The area of calcification noted on the CT is associated with this aneurysm and may represent a partially thrombosed portion of the aneurysm. Recommend MRA or CTA for additional evaluation. A small meningioma adjacent to the aneurysm is still considered.  Flow is otherwise normal. The postcontrast images demonstrate no pathologic enhancement.  Bilateral lens extractions are noted. The globes and orbits are otherwise intact. A fluid level is present in the sphenoid sinus. Mild mucosal thickening is present throughout the ethmoid air cells. The remaining paranasal sinuses and the mastoid air cells are clear.  IMPRESSION: 1. Focal diffusion signal abnormality within the right sylvian fissure. This appears extra-axial. This could be related to meningitis. A thrombosed vein is considered less likely without significant focal signal abnormality  on other sequences. No other mass lesion or enhancement is present. 2. Focal extension from the right posterior communicating artery compatible with any elongated aneurysm, measuring up to 11  mm. Recommend MRA or CTA for further evaluation. 3. Moderate atrophy and diffuse white matter disease. This likely reflects the sequelae of chronic microvascular ischemia. 4. Dilation of the lateral ventricles is likely related to the atrophy without significant hydrocephalus. 5. Mild ethmoid sinus disease.   Electronically Signed   By: Lawrence Santiago M.D.   On: 03/04/2014 15:21   Portable Chest Xray  03/05/2014   CLINICAL DATA:  Acute respiratory failure  EXAM: PORTABLE CHEST - 1 VIEW  COMPARISON:  03/02/2014  FINDINGS: Endotracheal tube tip is just below the clavicular heads.  The nasogastric tube tip overlaps the stomach on prior KUB, but the intrathoracic course is abnormally lateral. It is reassuring that the midportion of the tube projects left lateral of the left mainstem bronchus, arguing against a transbronchial placement, but the exact course cannot be established on this study. If clinical signs of placement are uncertain, further imaging or careful replacement is recommended. These results were called by telephone at the time of interpretation on 03/05/2014 at 4:05 am to RN [place], who verbally acknowledged these results.  There is a linear opacity in the retrocardiac lung consistent with atelectasis.  IMPRESSION: 1. Endotracheal tube is in good position. 2. Abnormal course of the orogastric tube, as discussed above. If clinical signs of gastric placement are not certain, further imaging or replacement (with prompt followup imaging) is recommended. 3. Retrocardiac atelectasis.   Electronically Signed   By: Jorje Guild M.D.   On: 03/05/2014 04:08   Dg Abd Portable 1v  03/05/2014   CLINICAL DATA:  Orogastric tube placement.  EXAM: PORTABLE ABDOMEN - 1 VIEW  COMPARISON:  None.  FINDINGS: The nasogastric tube tip overlaps the stomach, but the intra thoracic coarse is abnormally lateral. The tube may be intraluminal, but the exact course cannot be established on this study. If clinical signs of  placement are uncertain, further imaging or careful replacement is recommended. These results were called by telephone at the time of interpretation on 03/05/2014 at 3:58 am to RN [place], who verbally acknowledged these results.  The bowel gas pattern is nonobstructed.  Left lower lobe atelectasis.  IMPRESSION: The nasogastric tube tip overlaps the stomach, but the intrathoracic course is abnormally lateral. The tube may be intraluminal, but the exact course cannot be established on this study. If clinical signs of placement are uncertain, further imaging or careful replacement is recommended.   Electronically Signed   By: Jorje Guild M.D.   On: 03/05/2014 04:01    Assessment/Plan: Post-coil day 1 for aneurysmal subarachnoid hemorrhage blood pressure stable continue management with hydration and observation.  LOS: 4 days     Alexya Mcdaris P 03/06/2014, 8:41 AM

## 2014-03-06 NOTE — Consult Note (Signed)
PULMONARY / CRITICAL CARE MEDICINE   Name: Sharon [name] MRN: [id number] DOB: 1934/08/13    ADMISSION DATE:  03/02/2014 CONSULTATION DATE:  03/05/14  REFERRING MD :  Dr. Garen Grams  REASON FOR CONSULTATION: Ventilator-dependent respiratory failure  INITIAL PRESENTATION:  80 year old woman, active smoker with a history of arthritis. She began to have headache and possibly some associated weakness dating back to the end of November. Since that time she had experienced some increased somnolence and off-and-on headache. Then 12/28 she was noted to be disoriented. She was admitted 12/29 for neurological evaluation. Lumbar puncture was consistent with subarachnoid bleeding. 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 She underwent coiling of the aneurysm on 03/05/14. Post procedure she was admitted to the ICU on mechanical ventilation.  STUDIES:  Head CT 12/29 >> indeterminate punctate calcification in the medial aspect of the right middle cranial fossa, possibly small meningioma LP 12/31 > xanthochromic, 7.8K RBC, 158 WBC (41 neutrophils). Glucose 36. Total protein 84. 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 Continues in fetal position with photohobia  - versed prn drops her bp. Intermittently agitated and follows commands - fluctuates - per RN/RT - this was pre-procedure intubation position and neither worse/better. She was intubated only for procedure per hx  VITAL SIGNS: Temp:  [97 F (36.1 C)-98 F (36.7  C)] 97.9 F (36.6 C) (01/02 0752) Pulse Rate:  [55-107] 66 (01/02 0700) Resp:  [5-27] 14 (01/02 0700) BP: (73-150)/(43-81) 129/81 mmHg (01/02 0700) SpO2:  [96 %-100 %] 100 % (01/02 0700) Arterial Line BP: (121-157)/(57-125) 144/57 mmHg (01/01 1300) FiO2 (%):  [40 %] 40 % (01/02 0600) Weight:  [61.4 kg (135 lb 5.8 oz)] 61.4 kg (135 lb 5.8 oz) (01/02 0500) HEMODYNAMICS:   VENTILATOR SETTINGS: Vent Mode:  [-] PRVC FiO2 (%):  [40 %] 40 % Set Rate:  [14 bmp] 14 bmp Vt Set:  [500 mL] 500 mL PEEP:  [5 cmH20] 5 cmH20 Pressure Support:  [5 cmH20] 5 cmH20 Plateau Pressure:  [12 cmH20-16 cmH20] 15 cmH20 INTAKE / OUTPUT:  Intake/Output Summary (Last 24 hours) at 03/06/14 0841 Last data filed at 03/06/14 0700  Gross per 24 hour  Intake 3822.87 ml  Output   1350 ml  Net 2472.87 ml    PHYSICAL EXAMINATION: General: frail female on vent Neuro:  flucutating mental status, fetal position - callm currently HEENT:  Pupils pin point Cardiovascular:  regular Lungs:  No wheeze Abdomen:  Soft, non tender Musculoskeletal:  No edema Skin:  No rashes  LABS:  CBC  Recent Labs Lab 03/04/14 0545 03/05/14 0500 03/06/14 0138  WBC 10.0 9.5 13.0*  HGB 11.0* 11.4* 10.9*  HCT 33.3* 33.5* 31.9*  PLT 246 246 236   Coag's  Recent Labs Lab 03/02/14 1048  INR 1.01   BMET  Recent Labs Lab 03/04/14 0545 03/05/14 0500 03/06/14 0138  NA 131* 135 138  K 3.7 3.5 4.0  CL 101 105 111  CO2 24 23 23   BUN 12 11 13   CREATININE 0.66 0.63 0.59  GLUCOSE 95 140* 137*   Electrolytes  Recent  Labs Lab 03/04/14 0545 03/05/14 0500 03/06/14 0138  CALCIUM 7.6* 7.4* 6.7*   Sepsis Markers  Recent Labs Lab 03/02/14 1132  LATICACIDVEN 0.67   ABG  Recent Labs Lab 03/05/14 0333  PHART 7.396  PCO2ART 36.5  PO2ART 128.0*     Liver Enzymes  Recent Labs Lab 03/02/14 1048 03/05/14 0500  AST 21 16  ALT 12 11  ALKPHOS 51 41  BILITOT 0.7 0.6  ALBUMIN 4.0 2.8*   Cardiac Enzymes No  results for input(s): TROPONINI, PROBNP in the last 168 hours.   Glucose  Recent Labs Lab 03/02/14 1042 03/05/14 1955 03/05/14 2329 03/06/14 0322  GLUCAP 111* 129* 126* 124*    Imaging Ct Head Wo Contrast  03/05/2014   CLINICAL DATA:  Subarachnoid hemorrhage. Follow-up interventional procedure.  EXAM: CT HEAD WITHOUT CONTRAST  TECHNIQUE: Contiguous axial images were obtained from the base of the skull through the vertex without intravenous contrast.  COMPARISON:  03/02/2014  FINDINGS: Changes of aneurysm coiling in the right proximal MCA territory. Contrast material within the blood vessels from recent endovascular procedure. No acute infarction. No visible hemorrhage. No hydrocephalus. Chronic microvascular changes throughout the deep white matter.  Visualized paranasal sinuses and mastoids clear. Orbital soft tissues unremarkable.  IMPRESSION: Interval aneurysm coiling in the right MCA territory. No visible infarct or acute hemorrhage.   Electronically Signed   By: Rolm Baptise M.D.   On: 03/05/2014 07:22   Portable Chest Xray  03/05/2014   CLINICAL DATA:  Acute respiratory failure  EXAM: PORTABLE CHEST - 1 VIEW  COMPARISON:  03/02/2014  FINDINGS: Endotracheal tube tip is just below the clavicular heads.  The nasogastric tube tip overlaps the stomach on prior KUB, but the intrathoracic course is abnormally lateral. It is reassuring that the midportion of the tube projects left lateral of the left mainstem bronchus, arguing against a transbronchial placement, but the exact course cannot be established on this study. If clinical signs of placement are uncertain, further imaging or careful replacement is recommended. These results were called by telephone at the time of interpretation on 03/05/2014 at 4:05 am to RN [place], who verbally acknowledged these results.  There is a linear opacity in the retrocardiac lung consistent with atelectasis.  IMPRESSION: 1. Endotracheal tube is in good position. 2.  Abnormal course of the orogastric tube, as discussed above. If clinical signs of gastric placement are not certain, further imaging or replacement (with prompt followup imaging) is recommended. 3. Retrocardiac atelectasis.   Electronically Signed   By: Jorje Guild M.D.   On: 03/05/2014 04:08   Dg Abd Portable 1v  03/05/2014   CLINICAL DATA:  Orogastric tube placement.  EXAM: PORTABLE ABDOMEN - 1 VIEW  COMPARISON:  None.  FINDINGS: The nasogastric tube tip overlaps the stomach, but the intra thoracic coarse is abnormally lateral. The tube may be intraluminal, but the exact course cannot be established on this study. If clinical signs of placement are uncertain, further imaging or careful replacement is recommended. These results were called by telephone at the time of interpretation on 03/05/2014 at 3:58 am to RN [place], who verbally acknowledged these results.  The bowel gas pattern is nonobstructed.  Left lower lobe atelectasis.  IMPRESSION: The nasogastric tube tip overlaps the stomach, but the intrathoracic course is abnormally lateral. The tube may be intraluminal, but the exact course cannot be established on this study. If clinical signs of placement are uncertain, further imaging or careful replacement is recommended.   Electronically Signed  By: Jorje Guild M.D.   On: 03/05/2014 04:01     ASSESSMENT / PLAN:  PULMONARY ETT 03/05/14 >>  A:  Acute respiratory failure post procedure. History of tobacco use.   - somewhat meets extubation critieria P:   Extubate Precedex gtt -see neuro  CARDIOVASCULAR A:  Hx of HLD. P:  Continue zocor  RENAL A:   Low calcium P:   Calium gluconate IV X 1 Monitor renal fx, urine outpt NS IV fluid  GASTROINTESTINAL A:   Nutrition. P:   Dc tube feeds Protonix for SUP  HEMATOLOGIC A:   DVT prophylaxis. P:  SCD  INFECTIOUS A:   Fever with ?menigitis >> seems unlikely. Fever likely was due to [facility] P:   BCx2 12/29 >>  UC 12/29 >> 100k  multiple morphocytes CSF 12/31 >>   Ceftriaxone 12/30 >> 1/01 Ampicillin 12/30 >> 1/01 Acyclovir 12/30 >> 1/01 Vancomycin 12/31 >> 1/01  ENDOCRINE A:   No acute issues. P:   Monitor blood sugar on BMET  NEUROLOGIC A:   Acute encephalopathy 2nd to grade 3 SAH.   - fetal position, intermiteent agitatation described pre procedure intubation - unchanged P:   RASS goal: -1 Precedex post intubation Nimotop and Zocor for vasospasm prophylaxis to continue  No family at bedside - updated friend (danish gentleman and son) 03/15/14 and 03/06/14 - informed them of vasospasm risk - HIGH.  - Inter-disciplinary family meet or Palliative Care meeting due by 03/12/14:   SUMMARY  despite plan for extubation due to [facility] gr 3,  High risk vasospasm - so skeep in ICU. Still critically ill   The patient is critically ill with multiple organ systems failure and requires high complexity decision making for assessment and support, frequent evaluation and titration of therapies, application of advanced monitoring technologies and extensive interpretation of multiple databases.   Critical Care Time devoted to patient care services described in this note is  30  Minutes. This time reflects time of care of this signee Dr Brand Males. This critical care time does not reflect procedure time, or teaching time or supervisory time of PA/NP/Med student/Med Resident etc but could involve care discussion time    Dr. Brand Males, M.D., [facility].C.P Pulmonary and Critical Care Medicine Staff Physician [place] [facility] Pager: [phone], If no answer or between  15:00h - 7:00h: call [phone]  03/06/2014 8:47 AM

## 2014-03-06 NOTE — Progress Notes (Signed)
eLink Physician-Brief Progress Note Patient Name: Sharon [name] DOB: [birth year] MRN: [id number]   Date of Service  03/06/2014  HPI/Events of Note  sah coil, drop in BP wityh versed  eICU Interventions  Dc versed, bolus, uise fent     Intervention Category Major Interventions: Hypotension - evaluation and management  Dennisha Mouser J. 03/06/2014, 2:42 AM

## 2014-03-06 NOTE — Progress Notes (Signed)
Patient's BP improved initially with bolus. Would not stay within desired parameters. Dr. Titus Mould notified. Orders received.

## 2014-03-06 NOTE — Progress Notes (Signed)
Dr. Titus Mould notified of patient's drop in BP with versed to control severe agitation. BP drop greater than previous drops and not recovering as quickly. Orders received for bolus and to d/c versed pushes. Will monitor patient.

## 2014-03-07 DIAGNOSIS — I729 Aneurysm of unspecified site: Secondary | ICD-10-CM

## 2014-03-07 LAB — CBC WITH DIFFERENTIAL/PLATELET
Basophils Absolute: 0 10*3/uL (ref 0.0–0.1)
Basophils Relative: 0 % (ref 0–1)
EOS ABS: 0 10*3/uL (ref 0.0–0.7)
Eosinophils Relative: 0 % (ref 0–5)
HCT: 29.6 % — ABNORMAL LOW (ref 36.0–46.0)
HEMOGLOBIN: 9.7 g/dL — AB (ref 12.0–15.0)
LYMPHS ABS: 0.5 10*3/uL — AB (ref 0.7–4.0)
Lymphocytes Relative: 4 % — ABNORMAL LOW (ref 12–46)
MCH: 31.3 pg (ref 26.0–34.0)
MCHC: 32.8 g/dL (ref 30.0–36.0)
MCV: 95.5 fL (ref 78.0–100.0)
MONO ABS: 0.7 10*3/uL (ref 0.1–1.0)
MONOS PCT: 6 % (ref 3–12)
Neutro Abs: 10.1 10*3/uL — ABNORMAL HIGH (ref 1.7–7.7)
Neutrophils Relative %: 90 % — ABNORMAL HIGH (ref 43–77)
PLATELETS: 237 10*3/uL (ref 150–400)
RBC: 3.1 MIL/uL — AB (ref 3.87–5.11)
RDW: 13.1 % (ref 11.5–15.5)
WBC: 11.2 10*3/uL — AB (ref 4.0–10.5)

## 2014-03-07 LAB — BASIC METABOLIC PANEL
ANION GAP: 3 — AB (ref 5–15)
BUN: 14 mg/dL (ref 6–23)
CALCIUM: 7 mg/dL — AB (ref 8.4–10.5)
CHLORIDE: 112 meq/L (ref 96–112)
CO2: 23 mmol/L (ref 19–32)
CREATININE: 0.49 mg/dL — AB (ref 0.50–1.10)
GFR calc non Af Amer: >90 mL/min (ref >90)
Glucose, Bld: 144 mg/dL — ABNORMAL HIGH (ref 70–99)
Potassium: 3.3 mmol/L — ABNORMAL LOW (ref 3.5–5.1)
Sodium: 138 mmol/L (ref 135–145)

## 2014-03-07 LAB — HERPES SIMPLEX VIRUS(HSV) DNA BY PCR
HSV 1 DNA: NOT DETECTED
HSV 2 DNA: NOT DETECTED

## 2014-03-07 LAB — MAGNESIUM: MAGNESIUM: 2.4 mg/dL (ref 1.5–2.5)

## 2014-03-07 LAB — PHOSPHORUS: PHOSPHORUS: 1.9 mg/dL — AB (ref 2.3–4.6)

## 2014-03-07 LAB — HIV ANTIBODY (ROUTINE TESTING W REFLEX): HIV 1&2 Ab, 4th Generation: NONREACTIVE

## 2014-03-07 LAB — CLOSTRIDIUM DIFFICILE BY PCR: Toxigenic C. Difficile by PCR: NEGATIVE

## 2014-03-07 MED ORDER — FAMOTIDINE 20 MG PO TABS
20.0000 mg | ORAL_TABLET | Freq: Every day | ORAL | Status: DC
  Administered 2014-03-07 – 2014-03-18 (×11): 20 mg via ORAL
  Filled 2014-03-07 (×12): qty 1

## 2014-03-07 MED ORDER — POTASSIUM PHOSPHATES 15 MMOLE/5ML IV SOLN
30.0000 mmol | Freq: Once | INTRAVENOUS | Status: AC
  Administered 2014-03-07: 30 mmol via INTRAVENOUS
  Filled 2014-03-07: qty 10

## 2014-03-07 MED ORDER — LOPERAMIDE HCL 1 MG/5ML PO LIQD
4.0000 mg | ORAL | Status: DC | PRN
  Administered 2014-03-08 – 2014-03-10 (×3): 4 mg via ORAL
  Filled 2014-03-07 (×5): qty 20

## 2014-03-07 NOTE — Progress Notes (Signed)
PULMONARY / CRITICAL CARE MEDICINE   Name: Sharon [name] MRN: [id number] DOB: 07-19-1934    ADMISSION DATE:  03/02/2014 CONSULTATION DATE:  03/05/14  REFERRING MD :  Dr. Garen Grams  REASON FOR CONSULTATION: Ventilator-dependent respiratory failure  INITIAL PRESENTATION:  80 year old woman, active smoker with a history of arthritis. She began to have headache and possibly some associated weakness dating back to the end of November. Since that time she had experienced some increased somnolence and off-and-on headache. Then 12/28 she was noted to be disoriented. She was admitted 12/29 for neurological evaluation. Lumbar puncture was consistent with subarachnoid bleeding. 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 She underwent coiling of the aneurysm on 03/05/14. Post procedure she was admitted to the ICU on mechanical ventilation.   STUDIES:  Head CT 12/29 >> indeterminate punctate calcification in the medial aspect of the right middle cranial fossa, possibly small meningioma LP 12/31 > xanthochromic, 7.8K RBC, 158 WBC (41 neutrophils). Glucose 36. Total protein 84. Gram stain negative CSF HSV 12/31 >>  Brain MRI 12/31 >> focal diffusion signal in the right sylvian fissure, right posterior indicating arterial elongated aneurysm, dilation of the lateral ventricles but no evidence of hydrocephalus   03/05/14 >> coiling of the right posterior communicating artery aneurysm. Also noted on angiography was a left internal carotid artery aneurysm   03/06/14 - extubated   SUBJECTIVE/OVERNIGHT/INTERVAL HX   03/07/14: Improved mental status. Sitting in chair. Talking . Trying to work with PT ambulating. But pleasantly confused  VITAL SIGNS: Temp:  [97.5 F (36.4 C)-99.1 F (37.3 C)] 98.9 F (37.2 C) (01/03 0400) Pulse Rate:  [56-84] 72 (01/03 0900) Resp:  [6-26] 10 (01/03 0900) BP: (95-131)/(46-95) 99/46 mmHg (01/03  0900) SpO2:  [85 %-100 %] 90 % (01/03 0900) HEMODYNAMICS:   VENTILATOR SETTINGS:   INTAKE / OUTPUT:  Intake/Output Summary (Last 24 hours) at 03/07/14 0932 Last data filed at 03/07/14 0900  Gross per 24 hour  Intake   4015 ml  Output   1450 ml  Net   2565 ml    PHYSICAL EXAMINATION: General: frail female on vent Neuro: Sitting in chair. Talking . Trying to work with PT ambulating. But pleasantly confused HEENT:  Pupils pin point Cardiovascular:  regular Lungs:  No wheeze Abdomen:  Soft, non tender Musculoskeletal:  No edema Skin:  No rashes  LABS:  CBC  Recent Labs Lab 03/05/14 0500 03/06/14 0138 03/07/14 0225  WBC 9.5 13.0* 11.2*  HGB 11.4* 10.9* 9.7*  HCT 33.5* 31.9* 29.6*  PLT 246 236 237   Coag's  Recent Labs Lab 03/02/14 1048  INR 1.01   BMET  Recent Labs Lab 03/05/14 0500 03/06/14 0138 03/07/14 0225  NA 135 138 138  K 3.5 4.0 3.3*  CL 105 111 112  CO2 23 23 23   BUN 11 13 14   CREATININE 0.63 0.59 0.49*  GLUCOSE 140* 137* 144*   Electrolytes  Recent Labs Lab 03/05/14 0500 03/06/14 0138 03/07/14 0225  CALCIUM 7.4* 6.7* 7.0*  MG  --   --  2.4  PHOS  --   --  1.9*   Sepsis Markers  Recent Labs Lab 03/02/14 1132  LATICACIDVEN 0.67   ABG  Recent Labs Lab 03/05/14 0333  PHART 7.396  PCO2ART 36.5  PO2ART 128.0*     Liver Enzymes  Recent Labs Lab 03/02/14 1048 03/05/14 0500  AST 21 16  ALT 12 11  ALKPHOS 51 41  BILITOT 0.7 0.6  ALBUMIN 4.0 2.8*   Cardiac Enzymes No results for input(s): TROPONINI, PROBNP in the last 168 hours.   Glucose  Recent Labs Lab 03/02/14 1042 03/05/14 1955 03/05/14 2329 03/06/14 0322  GLUCAP 111* 129* 126* 124*    Imaging Ct Head Wo Contrast  03/06/2014   CLINICAL DATA:  Subsequent evaluation for stroke and aneurysm clipping on 03/05/14; severe photophobia today, non-verbal today  EXAM: CT HEAD WITHOUT CONTRAST  TECHNIQUE: Contiguous axial images were obtained from the base of the  skull through the vertex without intravenous contrast.  COMPARISON:  03/05/2014  FINDINGS: Right middle cerebral artery aneurysm clipping again identified. No evidence of hemorrhage or extra-axial fluid. No evidence of vascular territory infarct.  Mild atrophy. Moderate low attenuation in the periventricular white matter. Ventricles unchanged with no evidence of interval dilatation. Calvarium intact.  IMPRESSION: No change from 03/05/2014 with evidence of aneurysm clipping identified but no findings to suggest acute infarct or hemorrhage.   Electronically Signed   By: Skipper Cliche M.D.   On: 03/06/2014 09:32   Dg Chest Port 1 View  03/06/2014   CLINICAL DATA:  Acute respiratory failure with hypoxia  EXAM: PORTABLE CHEST - 1 VIEW  COMPARISON:  03/05/2014  FINDINGS: Endotracheal tube tip 3.2 cm above the carina.  The patient is rotated to the right on today's radiograph, reducing diagnostic sensitivity and specificity. Airway thickening is present, suggesting bronchitis or reactive airways disease. Low Linear subsegmental atelectasis observed at the right lung base. Nasogastric tube courses similar to prior.  Heart size within normal limits.  IMPRESSION: 1. Airway thickening is present, suggesting bronchitis or reactive airways disease. 2. Subsegmental atelectasis at the right lung base.   Electronically Signed   By: Sherryl Barters M.D.   On: 03/06/2014 09:18     ASSESSMENT / PLAN:  PULMONARY ETT 03/05/14 >> 03/06/14 A:  Acute respiratory failure post procedure. History of tobacco use.   - s/p successful extubation 02/03/15 and well 03/07/14  P:   Monitor clinically   CARDIOVASCULAR A:  Hx of HLD. P:  Continue zocor  RENAL A:   Low k and phos  P:   repleted by elink   GASTROINTESTINAL A:   Nutrition.  13/1/16- some loose stools P:   Per neurosurge for food timing Change Protonix to H2 blockade for SUP Awiat C diff - primary team will follow  HEMATOLOGIC A:   DVT  prophylaxis. P:  SCD  INFECTIOUS\ Results for orders placed or performed during the hospital encounter of 03/02/14  Urine culture     Status: None   Collection Time: 03/02/14 11:40 AM  Result Value Ref Range Status   Specimen Description URINE, CLEAN CATCH  Final   Special Requests ADDED [id number]  Final   Colony Count   Final    >=100,000 COLONIES/ML Performed at [facility]    Culture   Final    Multiple bacterial morphotypes present, none predominant. Suggest appropriate recollection if clinically indicated. Performed at [facility]    Report Status 03/04/2014 FINAL  Final  Culture, blood (routine x 2)     Status: None (Preliminary result)   Collection Time: 03/03/14  1:51 AM  Result Value Ref Range Status   Specimen Description BLOOD LEFT HAND  Final   Special Requests BOTTLES DRAWN AEROBIC ONLY 6CC  Final   Culture   Final           BLOOD CULTURE RECEIVED NO GROWTH TO DATE  CULTURE WILL BE HELD FOR 5 DAYS BEFORE ISSUING A FINAL NEGATIVE REPORT Performed at [facility]    Report Status PENDING  Incomplete  Culture, blood (routine x 2)     Status: None (Preliminary result)   Collection Time: 03/03/14  8:22 AM  Result Value Ref Range Status   Specimen Description BLOOD LEFT ARM  Final   Special Requests BOTTLES DRAWN AEROBIC AND ANAEROBIC 10CC  Final   Culture   Final           BLOOD CULTURE RECEIVED NO GROWTH TO DATE CULTURE WILL BE HELD FOR 5 DAYS BEFORE ISSUING A FINAL NEGATIVE REPORT Performed at [facility]    Report Status PENDING  Incomplete  Gram stain     Status: None   Collection Time: 03/04/14  3:00 PM  Result Value Ref Range Status   Specimen Description CSF  Final   Special Requests CSF FLUID NO 2 3.5CC  Final   Gram Stain   Final    CYTOSPIN PREP WBC PRESENT,BOTH PMN AND MONONUCLEAR NO ORGANISMS SEEN    Report Status 03/04/2014 FINAL  Final  MRSA PCR Screening     Status: None   Collection Time: 03/04/14  7:32 PM   Result Value Ref Range Status   MRSA by PCR NEGATIVE NEGATIVE Final    Comment:        The GeneXpert MRSA Assay (FDA approved for NASAL specimens only), is one component of a comprehensive MRSA colonization surveillance program. It is not intended to diagnose MRSA infection nor to guide or monitor treatment for MRSA infections.     A:   Fever with ?menigitis >> seems unlikely. Fever likely was due to [facility] P:   Mnitor off abx  Ceftriaxone 12/30 >> 1/01 Ampicillin 12/30 >> 1/01 Acyclovir 12/30 >> 1/01 Vancomycin 12/31 >> 1/01  ENDOCRINE A:   No acute issues. P:   Monitor blood sugar on BMET  NEUROLOGIC A:   Acute encephalopathy 2nd to grade 3 SAH.   - improved 03/07/14 but has entered vasospasm risk phase  P:   Nimotop and Zocor for vasospasm prophylaxis to continue per neurosurgery Continue saline at 18ml/H per nuerosurgery ? Still needs decadron - neurosurgery to clarify   No family at bedside - updated friend (danish gentleman and son) 03/15/14 and 03/06/14 - informed them of vasospasm risk - HIGH. Son and patient updated 1./3/16  - Inter-disciplinary family meet or Palliative Care meeting due by 03/12/14:   SUMMARY   - PCCM will sign off       Dr. Brand Males, M.D., [facility].C.P Pulmonary and Critical Care Medicine Staff Physician [facility] Pager: [phone], If no answer or between  15:00h - 7:00h: call [phone]  03/07/2014 9:32 AM

## 2014-03-07 NOTE — Progress Notes (Signed)
Subjective: Patient reports she is doing well. There is no headache.   Objective: Vital signs in last 24 hours: Temp:  [97.5 F (36.4 C)-99.1 F (37.3 C)] 98.9 F (37.2 C) (01/03 0400) Pulse Rate:  [56-84] 56 (01/03 0800) Resp:  [6-26] 9 (01/03 0800) BP: (95-131)/(47-95) 123/60 mmHg (01/03 0800) SpO2:  [85 %-100 %] 97 % (01/03 0800)  Intake/Output from previous day: 01/02 0730 - 01/03 0729 In: 3541.7 [P.O.:150; I.V.:2756.7; NG/GT:15; IV Piggyback:620] Out: 1450 [Urine:1450] Intake/Output this shift: Total I/O In: 370 [P.O.:120; I.V.:250] Out: -   Patient is awake and alert and sitting up in a chair eating breakfast. She is moving all extremities well. I do think there is some disorientation and confusion.  Lab Results: Lab Results  Component Value Date   WBC 11.2* 03/07/2014   HGB 9.7* 03/07/2014   HCT 29.6* 03/07/2014   MCV 95.5 03/07/2014   PLT 237 03/07/2014   Lab Results  Component Value Date   INR 1.01 03/02/2014   BMET Lab Results  Component Value Date   NA 138 03/07/2014   K 3.3* 03/07/2014   CL 112 03/07/2014   CO2 23 03/07/2014   GLUCOSE 144* 03/07/2014   BUN 14 03/07/2014   CREATININE 0.49* 03/07/2014   CALCIUM 7.0* 03/07/2014    Studies/Results: Ct Head Wo Contrast  03/06/2014   CLINICAL DATA:  Subsequent evaluation for stroke and aneurysm clipping on 03/05/14; severe photophobia today, non-verbal today  EXAM: CT HEAD WITHOUT CONTRAST  TECHNIQUE: Contiguous axial images were obtained from the base of the skull through the vertex without intravenous contrast.  COMPARISON:  03/05/2014  FINDINGS: Right middle cerebral artery aneurysm clipping again identified. No evidence of hemorrhage or extra-axial fluid. No evidence of vascular territory infarct.  Mild atrophy. Moderate low attenuation in the periventricular white matter. Ventricles unchanged with no evidence of interval dilatation. Calvarium intact.  IMPRESSION: No change from 03/05/2014 with evidence of  aneurysm clipping identified but no findings to suggest acute infarct or hemorrhage.   Electronically Signed   By: Skipper Cliche M.D.   On: 03/06/2014 09:32   Dg Chest Port 1 View  03/06/2014   CLINICAL DATA:  Acute respiratory failure with hypoxia  EXAM: PORTABLE CHEST - 1 VIEW  COMPARISON:  03/05/2014  FINDINGS: Endotracheal tube tip 3.2 cm above the carina.  The patient is rotated to the right on today's radiograph, reducing diagnostic sensitivity and specificity. Airway thickening is present, suggesting bronchitis or reactive airways disease. Low Linear subsegmental atelectasis observed at the right lung base. Nasogastric tube courses similar to prior.  Heart size within normal limits.  IMPRESSION: 1. Airway thickening is present, suggesting bronchitis or reactive airways disease. 2. Subsegmental atelectasis at the right lung base.   Electronically Signed   By: Sherryl Barters M.D.   On: 03/06/2014 09:18    Assessment/Plan: Overall doing very well. Continue IV hydration and mobilization   LOS: 5 days    Azavion Bouillon S 03/07/2014, 9:07 AM

## 2014-03-07 NOTE — Progress Notes (Signed)
[facility] [place] Progress Note Patient Name: Sharon [name] DOB: 1934-03-18 MRN: [id number]   Date of Service  03/07/2014  HPI/Events of Note  Hypokalemia and hypophos  eICU Interventions  Potassium and Phos replaced     Intervention Category Intermediate Interventions: Electrolyte abnormality - evaluation and management  Kaiyla Stahly 03/07/2014, 3:55 AM

## 2014-03-07 NOTE — Evaluation (Signed)
Physical Therapy Evaluation Patient Details Name: Sharon [name] MRN: [id number] DOB: 06/29/1934 Today's Date: 03/07/2014   History of Present Illness  80 year old female admitted 02/20/14 due to AMS, aneurysmal SAH. PMH significant for DLD. CXR negative, CT indicated nothing acute. 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  Clinical Impression  Patient demonstrates deficits in functional mobility as indicated below. Will need continue skilled PT to address deficits and maximize function. Will sees as indicated and progress as tolerated. At this time, recommend CIR consult as patient was highly independent (going to yoga 3x a week) prior to event, patient now with decreased functional mobility and cognitive deficits and will highly benefit from intense comprehensive therapies to maximize recovery.  VSS during session    Follow Up Recommendations CIR;Supervision/Assistance - 24 hour    Equipment Recommendations  Other (comment) (TBD)    Recommendations for Other Services Rehab consult     Precautions / Restrictions Precautions Precautions: Fall Restrictions Weight Bearing Restrictions: No      Mobility  Bed Mobility Overal bed mobility: Needs Assistance Bed Mobility: Rolling;Sit to Supine Rolling: Supervision     Sit to supine: Min assist   General bed mobility comments: for safety with mobility secondary to impulsivity, pulling at IV line because she could not remember why it was there, Max cues to direct to task of repositioning  Transfers Overall transfer level: Needs assistance Equipment used: 1 person hand held assist Transfers: Sit to/from Stand Sit to Stand: Mod assist         General transfer comment: wrap around support with HHA and gait belt, poor stability, posterior LOB x2  during standing, Performed sit <> stand from 3 surfaces 2x from bed. Able to stand with ming guard x2 but with total disregard for safety and lines and leads  Ambulation/Gait Ambulation/Gait assistance: Mod assist Ambulation Distance (Feet): 60 Feet Assistive device: 1 person hand held assist Gait Pattern/deviations: Decreased stride length;Step-through pattern;Leaning posteriorly;Staggering left;Staggering right;Narrow base of support Gait velocity: decreased Gait velocity interpretation: Below normal speed for age/gender General Gait Details: easily distracted with significant instability and posterior lean, at times patient walking only on the outer portions of her feet, very narrow BOS and difficulty directing to task.  Assist for stability and safety  Stairs            Wheelchair Mobility    Modified Rankin (Stroke Patients Only)       Balance Overall balance assessment: Needs assistance Sitting-balance support: Feet supported Sitting balance-Leahy Scale: Good     Standing balance support: No upper extremity supported;During functional activity Standing balance-Leahy Scale: Poor                               Pertinent Vitals/Pain Pain Assessment: No/denies pain    Home Living Family/patient expects to be discharged to:: Private residence Living Arrangements: Spouse/significant other Available Help at Discharge: Family;Friend(s) Type of Home: House Home Access: Stairs to enter   [employer] of Steps: 2 Home Layout: One level   Additional Comments: patient poor historian secondary to confusion at this time, began getting frustrated when she could not answer question regarding railings    Prior Function Level of Independence: Independent         Comments: was going to yoga several  times a week      Hand Dominance   Dominant Hand: Right    Extremity/Trunk Assessment               Lower Extremity Assessment: Overall WFL  for tasks assessed (strength 4/5 grossly, + arthritic changes, poor coordination)         Communication      Cognition Arousal/Alertness: Awake/alert Behavior During Therapy: Anxious;Impulsive Overall Cognitive Status: Impaired/Different from baseline Area of Impairment: Memory;Following commands;Safety/judgement;Awareness;Problem solving;Attention   Current Attention Level: Focused Memory: Decreased short-term memory Following Commands: Follows one step commands consistently;Follows multi-step commands inconsistently Safety/Judgement: Decreased awareness of safety;Decreased awareness of deficits Awareness: Intellectual Problem Solving: Slow processing;Decreased initiation;Difficulty sequencing;Requires verbal cues;Requires tactile cues General Comments: Patient with confusion at times pleasently confused at other times aggresively confused.  patient stated that she wanted to "curl up in that bag" referring to the dirty linen bag, she also could not remain on task without MAX verbal and tactile cues, very easily distracted.    General Comments      Exercises        Assessment/Plan    PT Assessment Patient needs continued PT services  PT Diagnosis Difficulty walking;Abnormality of gait;Altered mental status   PT Problem List Decreased range of motion;Decreased activity tolerance;Decreased balance;Decreased mobility;Decreased coordination;Decreased cognition;Decreased knowledge of use of DME;Decreased safety awareness  PT Treatment Interventions DME instruction;Gait training;Stair training;Functional mobility training;Therapeutic activities;Therapeutic exercise;Balance training;Cognitive remediation;Patient/family education   PT Goals (Current goals can be found in the Care Plan section) Acute Rehab PT Goals Patient Stated Goal: to go home PT Goal Formulation: With patient Time For Goal Achievement: 03/21/14 Potential to Achieve Goals: Good    Frequency Min 3X/week    Barriers to discharge        Co-evaluation               End of Session Equipment Utilized During Treatment: Gait belt Activity Tolerance: Patient tolerated treatment well Patient left: in bed;with call bell/phone within reach;with bed alarm set Nurse Communication: Mobility status         Time: [id number] PT Time Calculation (min) (ACUTE ONLY): 28 min   Charges:   PT Evaluation $Initial PT Evaluation Tier I: 1 Procedure PT Treatments $Gait Training: 8-22 mins $Therapeutic Activity: 8-22 mins   PT G CodesDuncan Dull 03/11/14, 11:57 AM Alben Deeds, PT DPT  [phone]

## 2014-03-08 ENCOUNTER — Encounter (HOSPITAL_COMMUNITY): Payer: Self-pay | Admitting: Radiology

## 2014-03-08 LAB — CBC WITH DIFFERENTIAL/PLATELET
Basophils Absolute: 0 10*3/uL (ref 0.0–0.1)
Basophils Relative: 0 % (ref 0–1)
EOS PCT: 0 % (ref 0–5)
Eosinophils Absolute: 0 10*3/uL (ref 0.0–0.7)
HEMATOCRIT: 31.5 % — AB (ref 36.0–46.0)
HEMOGLOBIN: 10.4 g/dL — AB (ref 12.0–15.0)
LYMPHS ABS: 0.4 10*3/uL — AB (ref 0.7–4.0)
LYMPHS PCT: 4 % — AB (ref 12–46)
MCH: 31.2 pg (ref 26.0–34.0)
MCHC: 33 g/dL (ref 30.0–36.0)
MCV: 94.6 fL (ref 78.0–100.0)
MONOS PCT: 7 % (ref 3–12)
Monocytes Absolute: 0.8 10*3/uL (ref 0.1–1.0)
Neutro Abs: 10 10*3/uL — ABNORMAL HIGH (ref 1.7–7.7)
Neutrophils Relative %: 89 % — ABNORMAL HIGH (ref 43–77)
Platelets: 244 10*3/uL (ref 150–400)
RBC: 3.33 MIL/uL — AB (ref 3.87–5.11)
RDW: 13.3 % (ref 11.5–15.5)
WBC: 11.3 10*3/uL — ABNORMAL HIGH (ref 4.0–10.5)

## 2014-03-08 LAB — BASIC METABOLIC PANEL
Anion gap: 5 (ref 5–15)
BUN: 10 mg/dL (ref 6–23)
CALCIUM: 7.5 mg/dL — AB (ref 8.4–10.5)
CO2: 24 mmol/L (ref 19–32)
CREATININE: 0.57 mg/dL (ref 0.50–1.10)
Chloride: 112 mEq/L (ref 96–112)
GFR calc Af Amer: >90 mL/min (ref >90)
GFR, EST NON AFRICAN AMERICAN: 87 mL/min — AB (ref >90)
GLUCOSE: 127 mg/dL — AB (ref 70–99)
Potassium: 3.8 mmol/L (ref 3.5–5.1)
Sodium: 141 mmol/L (ref 135–145)

## 2014-03-08 LAB — PHOSPHORUS: Phosphorus: 2 mg/dL — ABNORMAL LOW (ref 2.3–4.6)

## 2014-03-08 LAB — CSF CULTURE

## 2014-03-08 LAB — CSF CULTURE W GRAM STAIN: Culture: NO GROWTH

## 2014-03-08 LAB — MAGNESIUM: Magnesium: 2.4 mg/dL (ref 1.5–2.5)

## 2014-03-08 NOTE — Progress Notes (Signed)
Patient ID: Sharon [name], female   DOB: [birth year], 80 y.o.   MRN: [id number] Subjective:  The patient is alert and pleasant. She looks much better. He is in no apparent distress. She has no complaints.  Objective: Vital signs in last 24 hours: Temp:  [97.9 F (36.6 C)-99.2 F (37.3 C)] 98.4 F (36.9 C) (01/04 0751) Pulse Rate:  [55-92] 55 (01/04 0700) Resp:  [9-20] 11 (01/04 0700) BP: (99-146)/(46-78) 134/63 mmHg (01/04 0700) SpO2:  [86 %-97 %] 97 % (01/04 0700) Weight:  [63.4 kg (139 lb 12.4 oz)] 63.4 kg (139 lb 12.4 oz) (01/04 0400)  Intake/Output from previous day: 01/03 0701 - 01/04 0700 In: 3445 [P.O.:320; I.V.:3125] Out: 875 [Urine:875] Intake/Output this shift:    Physical exam the patient is alert and oriented 3, Glasgow Coma Scale 15. She is moving all 4 extremities well. Her speech is normal.  Lab Results:  Recent Labs  03/07/14 0225 03/08/14 0253  WBC 11.2* 11.3*  HGB 9.7* 10.4*  HCT 29.6* 31.5*  PLT 237 244   BMET  Recent Labs  03/07/14 0225 03/08/14 0253  NA 138 141  K 3.3* 3.8  CL 112 112  CO2 23 24  GLUCOSE 144* 127*  BUN 14 10  CREATININE 0.49* 0.57  CALCIUM 7.0* 7.5*    Studies/Results: Ct Head Wo Contrast  03/06/2014   CLINICAL DATA:  Subsequent evaluation for stroke and aneurysm clipping on 03/05/14; severe photophobia today, non-verbal today  EXAM: CT HEAD WITHOUT CONTRAST  TECHNIQUE: Contiguous axial images were obtained from the base of the skull through the vertex without intravenous contrast.  COMPARISON:  03/05/2014  FINDINGS: Right middle cerebral artery aneurysm clipping again identified. No evidence of hemorrhage or extra-axial fluid. No evidence of vascular territory infarct.  Mild atrophy. Moderate low attenuation in the periventricular white matter. Ventricles unchanged with no evidence of interval dilatation. Calvarium intact.  IMPRESSION: No change from 03/05/2014 with evidence of aneurysm clipping identified but no findings to  suggest acute infarct or hemorrhage.   Electronically Signed   By: Skipper Cliche M.D.   On: 03/06/2014 09:32    Assessment/Plan: Status post subarachnoid hemorrhage: The patient is doing well clinically and improving. We are waiting transcranial Dopplers to monitor for vasospasm.  LOS: 6 days     Delon Revelo D 03/08/2014, 7:57 AM

## 2014-03-08 NOTE — Progress Notes (Signed)
Physical Therapy Treatment Patient Details Name: Sharon [name] MRN: [id number] DOB: Jan 07, 1935 Today's Date: 03/08/2014    History of Present Illness 80 year old female admitted 03/02/14 due to AMS, aneurysmal SAH. PMH significant for DLD. CXR negative, CT indicated nothing acute. 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    PT Comments    Pt decreased awareness and poor attention limiting mobility today as pt very externally distracted and difficult to stay on task.  Pt continues to require A for all aspects of mobility and will need CIR at D/C.  Will continue to follow.    Follow Up Recommendations  CIR     Equipment Recommendations   (TBD)    Recommendations for Other Services Rehab consult     Precautions / Restrictions Precautions Precautions: Fall Restrictions Weight Bearing Restrictions: No    Mobility  Bed Mobility Overal bed mobility: Needs Assistance Bed Mobility: Supine to Sit     Supine to sit: Min assist     General bed mobility comments: cues fro attending to task and to slow down as pt a little impulsive.    Transfers Overall transfer level: Needs assistance Equipment used: 1 person hand held assist Transfers: Sit to/from Stand Sit to Stand: Min assist         General transfer comment: cues for Ue use and attending to task.  pt generally unsteady and reaches out for furniture to hold.    Ambulation/Gait Ambulation/Gait assistance: Min assist Ambulation Distance (Feet): 30 Feet Assistive device: 1 person hand held assist Gait Pattern/deviations: Step-through pattern;Decreased stride length;Narrow base of support     General Gait Details: pt unsteady and requires consistent MinA to maintain balance.  pt with narrow BOS and generally staggered gait.  pt with  decreased awareness of deficits and has increased difficulty attending to task during mobility.     Stairs            Wheelchair Mobility    Modified Rankin (Stroke Patients Only) Modified Rankin (Stroke Patients Only) Pre-Morbid Rankin Score: No symptoms Modified Rankin: Moderately severe disability     Balance Overall balance assessment: Needs assistance Sitting-balance support: No upper extremity supported;Feet supported Sitting balance-Leahy Scale: Good     Standing balance support: No upper extremity supported;During functional activity Standing balance-Leahy Scale: Poor Standing balance comment: pt reaches out for furniture to hold and requires MinA.                      Cognition Arousal/Alertness: Awake/alert Behavior During Therapy: Impulsive Overall Cognitive Status: Impaired/Different from baseline Area of Impairment: Orientation;Attention;Memory;Following commands;Safety/judgement;Awareness;Problem solving Orientation Level: Disoriented to;Time (pt able to state something is wrong with her brain. ) Current Attention Level: Focused Memory: Decreased short-term memory Following Commands: Follows one step commands consistently;Follows multi-step commands inconsistently Safety/Judgement: Decreased awareness of safety;Decreased awareness of deficits Awareness: Intellectual Problem Solving: Slow processing;Decreased initiation;Difficulty sequencing;Requires verbal cues;Requires tactile cues General Comments: pt with very short attention and easily distracted.  pt gets irritated easily and needs gentle cueing along with distraction for participation.  pt able to state she was in the hospital, but only knew something was wrong with her brain when asked about situation.      Exercises      General Comments        Pertinent Vitals/Pain Pain Assessment: No/denies pain  Home Living                      Prior Function            PT Goals  (current goals can now be found in the care plan section) Acute Rehab PT Goals Patient Stated Goal: to go home PT Goal Formulation: With patient Time For Goal Achievement: 03/21/14 Potential to Achieve Goals: Good Progress towards PT goals: Progressing toward goals    Frequency  Min 3X/week    PT Plan Current plan remains appropriate    Co-evaluation             End of Session Equipment Utilized During Treatment: Gait belt Activity Tolerance: Patient tolerated treatment well Patient left: in chair;with call bell/phone within reach;with family/visitor present     Time: 0757-0826 PT Time Calculation (min) (ACUTE ONLY): 29 min  Charges:  $Gait Training: 8-22 mins $Therapeutic Activity: 8-22 mins                    G CodesCatarina Hartshorn, [place] 03/08/2014, 9:41 AM

## 2014-03-08 NOTE — Progress Notes (Signed)
Referring Physician(s): PCCM  Subjective:  PCOM aneurysm coiling 03/05/14 in IR Pt has done well Now of vent Answering appropriately   Allergies: Review of patient's allergies indicates no known allergies.  Medications: Prior to Admission medications   Medication Sig Start Date End Date Taking? Authorizing Provider  aspirin EC 81 MG tablet Take 81 mg by mouth daily.   Yes Historical Provider, MD  cholecalciferol (VITAMIN D) 1000 UNITS tablet Take 1,000 Units by mouth daily.   Yes Historical Provider, MD  ibuprofen (ADVIL,MOTRIN) 200 MG tablet Take 200 mg by mouth every 6 (six) hours as needed.   Yes Historical Provider, MD  Minoxidil (ROGAINE EX) Apply 1 application topically daily.   Yes Historical Provider, MD  naproxen sodium (ANAPROX) 220 MG tablet Take 220 mg by mouth 2 (two) times daily with a meal.   Yes Historical Provider, MD  Omega-3 Fatty Acids (FISH OIL PO) Take 1 tablet by mouth daily.   Yes Historical Provider, MD  RENOVA 0.02 % CREA Apply 1 application topically daily. 01/12/14  Yes Historical Provider, MD  simvastatin (ZOCOR) 20 MG tablet Take 1 tablet by mouth daily. 02/07/14  Yes Historical Provider, MD  vitamin E 100 UNIT capsule Take 100 Units by mouth daily.   Yes Historical Provider, MD  ondansetron (ZOFRAN ODT) 4 MG disintegrating tablet Take 1 tablet (4 mg total) by mouth every 8 (eight) hours as needed for nausea or vomiting. 10/11/13   Sherian Maroon, MD    Review of Systems  Vital Signs: BP 129/66 mmHg  Pulse 73  Temp(Src) 97.9 F (36.6 C) (Oral)  Resp 16  Ht 5\' 3"  (1.6 m)  Wt 63.4 kg (139 lb 12.4 oz)  BMI 24.77 kg/m2  SpO2 92%  Physical Exam  Constitutional: She is oriented to person, place, and time. She appears well-developed and well-nourished.  Eyes: EOM are normal.  Abdominal:  Rt groin NT; no bleeding No hematoma  Musculoskeletal: Normal range of motion.  Good strength B- all 4s Good sensation B  Neurological: She is alert and oriented  to person, place, and time.  Seems mildly agitated about answering questions  Skin: Skin is warm.  Rt foot 2 + pulses  Psychiatric: She has a normal mood and affect. Her behavior is normal.  Nursing note and vitals reviewed.   Imaging: Ct Head Wo Contrast  03/06/2014   CLINICAL DATA:  Subsequent evaluation for stroke and aneurysm clipping on 03/05/14; severe photophobia today, non-verbal today  EXAM: CT HEAD WITHOUT CONTRAST  TECHNIQUE: Contiguous axial images were obtained from the base of the skull through the vertex without intravenous contrast.  COMPARISON:  03/05/2014  FINDINGS: Right middle cerebral artery aneurysm clipping again identified. No evidence of hemorrhage or extra-axial fluid. No evidence of vascular territory infarct.  Mild atrophy. Moderate low attenuation in the periventricular white matter. Ventricles unchanged with no evidence of interval dilatation. Calvarium intact.  IMPRESSION: No change from 03/05/2014 with evidence of aneurysm clipping identified but no findings to suggest acute infarct or hemorrhage.   Electronically Signed   By: Skipper Cliche M.D.   On: 03/06/2014 09:32   Ct Head Wo Contrast  03/05/2014   CLINICAL DATA:  Subarachnoid hemorrhage. Follow-up interventional procedure.  EXAM: CT HEAD WITHOUT CONTRAST  TECHNIQUE: Contiguous axial images were obtained from the base of the skull through the vertex without intravenous contrast.  COMPARISON:  03/02/2014  FINDINGS: Changes of aneurysm coiling in the right proximal MCA territory. Contrast material within the blood vessels  from recent endovascular procedure. No acute infarction. No visible hemorrhage. No hydrocephalus. Chronic microvascular changes throughout the deep white matter.  Visualized paranasal sinuses and mastoids clear. Orbital soft tissues unremarkable.  IMPRESSION: Interval aneurysm coiling in the right MCA territory. No visible infarct or acute hemorrhage.   Electronically Signed   By: Rolm Baptise M.D.    On: 03/05/2014 07:22   Dg Chest Port 1 View  03/06/2014   CLINICAL DATA:  Acute respiratory failure with hypoxia  EXAM: PORTABLE CHEST - 1 VIEW  COMPARISON:  03/05/2014  FINDINGS: Endotracheal tube tip 3.2 cm above the carina.  The patient is rotated to the right on today's radiograph, reducing diagnostic sensitivity and specificity. Airway thickening is present, suggesting bronchitis or reactive airways disease. Low Linear subsegmental atelectasis observed at the right lung base. Nasogastric tube courses similar to prior.  Heart size within normal limits.  IMPRESSION: 1. Airway thickening is present, suggesting bronchitis or reactive airways disease. 2. Subsegmental atelectasis at the right lung base.   Electronically Signed   By: Sherryl Barters M.D.   On: 03/06/2014 09:18   Portable Chest Xray  03/05/2014   CLINICAL DATA:  Acute respiratory failure  EXAM: PORTABLE CHEST - 1 VIEW  COMPARISON:  03/02/2014  FINDINGS: Endotracheal tube tip is just below the clavicular heads.  The nasogastric tube tip overlaps the stomach on prior KUB, but the intrathoracic course is abnormally lateral. It is reassuring that the midportion of the tube projects left lateral of the left mainstem bronchus, arguing against a transbronchial placement, but the exact course cannot be established on this study. If clinical signs of placement are uncertain, further imaging or careful replacement is recommended. These results were called by telephone at the time of interpretation on 03/05/2014 at 4:05 am to RN [place], who verbally acknowledged these results.  There is a linear opacity in the retrocardiac lung consistent with atelectasis.  IMPRESSION: 1. Endotracheal tube is in good position. 2. Abnormal course of the orogastric tube, as discussed above. If clinical signs of gastric placement are not certain, further imaging or replacement (with prompt followup imaging) is recommended. 3. Retrocardiac atelectasis.   Electronically Signed    By: Jorje Guild M.D.   On: 03/05/2014 04:08   Dg Abd Portable 1v  03/05/2014   CLINICAL DATA:  Orogastric tube placement.  EXAM: PORTABLE ABDOMEN - 1 VIEW  COMPARISON:  None.  FINDINGS: The nasogastric tube tip overlaps the stomach, but the intra thoracic coarse is abnormally lateral. The tube may be intraluminal, but the exact course cannot be established on this study. If clinical signs of placement are uncertain, further imaging or careful replacement is recommended. These results were called by telephone at the time of interpretation on 03/05/2014 at 3:58 am to RN [place], who verbally acknowledged these results.  The bowel gas pattern is nonobstructed.  Left lower lobe atelectasis.  IMPRESSION: The nasogastric tube tip overlaps the stomach, but the intrathoracic course is abnormally lateral. The tube may be intraluminal, but the exact course cannot be established on this study. If clinical signs of placement are uncertain, further imaging or careful replacement is recommended.   Electronically Signed   By: Jorje Guild M.D.   On: 03/05/2014 04:01    Labs:  CBC:  Recent Labs  03/05/14 0500 03/06/14 0138 03/07/14 0225 03/08/14 0253  WBC 9.5 13.0* 11.2* 11.3*  HGB 11.4* 10.9* 9.7* 10.4*  HCT 33.5* 31.9* 29.6* 31.5*  PLT 246 236 237 244  COAGS:  Recent Labs  03/02/14 1048  INR 1.01    BMP:  Recent Labs  03/05/14 0500 03/06/14 0138 03/07/14 0225 03/08/14 0253  NA 135 138 138 141  K 3.5 4.0 3.3* 3.8  CL 105 111 112 112  CO2 23 23 23 24   GLUCOSE 140* 137* 144* 127*  BUN 11 13 14 10   CALCIUM 7.4* 6.7* 7.0* 7.5*  CREATININE 0.63 0.59 0.49* 0.57  GFRNONAA 84* 86* >90 87*  GFRAA >90 >90 >90 >90    LIVER FUNCTION TESTS:  Recent Labs  03/02/14 1048 03/05/14 0500  BILITOT 0.7 0.6  AST 21 16  ALT 12 11  ALKPHOS 51 41  PROT 6.8 5.3*  ALBUMIN 4.0 2.8*    Assessment and Plan:  PCOM aneurysm coiling 03/05/14 Better daily Will report to Dr Estanislado Pandy Will need  follow up with IR after dc---she will hear from scheduler   I spent a total of 15 minutes face to face in clinical consultation/evaluation, greater than 50% of which was counseling/coordinating care for PCOM aneurysm coiling  Signed: TURPIN,PAMELA A 03/08/2014, 3:39 PM

## 2014-03-08 NOTE — Progress Notes (Signed)
Rehab Admissions Coordinator Note:  Patient was screened by Retta Diones for appropriateness for an Inpatient Acute Rehab Consult.  At this time, we are recommending Inpatient Rehab consult.  Retta Diones 03/08/2014, 9:20 AM  I can be reached at [phone].

## 2014-03-08 NOTE — Consult Note (Signed)
Physical Medicine and Rehabilitation Consult Reason for Consult: [facility] Referring Physician: Critical care   HPI: Sharon [name] is a 80 y.o. right handed female with history of hyperlipidemia. Admitted 03/02/2014 with altered mental status. Patient independent prior to admission living with her significant other. By report a family patient had been experienced headache with weakness while at a recent dinner party. Initial cranial CT scan negative. MRI showed focal diffusion signal abnormality within the right sylvian fissure. 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 Lumbar puncture completed showing significant subarachnoid blood mixed with xanthochromic fluid. Interventional radiology consulted with cerebral arteriogram completed and underwent coiling of aneurysm 03/05/2014. Patient maintained on mechanical ventilation. Follow-up cranial CT scan showed no visible infarct or acute hemorrhage after coiling. Patient was extubated 03/06/2014. Patient with low-grade fever meningitis ruled out continued on broad-spectrum antibiotics. Decadron protocol as directed. Presently on a mechanical soft diet. Physical therapy evaluation completed with recommendations of physical medicine rehabilitation consult.   Review of Systems  Genitourinary: Positive for urgency.  Neurological: Positive for headaches.  Psychiatric/Behavioral: Positive for memory loss.  All other systems reviewed and are negative.  Past Medical History  Diagnosis Date  . Dyslipidemia   . Cystitis 03/02/2014    acute   . UTI (urinary tract infection) 03/02/2014  . Arthritis     osteo   Past Surgical History  Procedure Laterality Date  . Abdominal hysterectomy    . Radiology with anesthesia N/A 03/04/2014    Procedure: RADIOLOGY WITH ANESTHESIA;  Surgeon: Medication Radiologist, MD;  Location: [place];  Service: Radiology;  Laterality: N/A;  . Radiology  with anesthesia N/A 03/04/2014    Procedure: RADIOLOGY WITH ANESTHESIA;  Surgeon: Rob Hickman, MD;  Location: [place];  Service: Radiology;  Laterality: N/A;   Family History  Problem Relation Age of Onset  . Hypertension Mother   . Hypertension Father    Social History:  reports that she has been smoking Cigarettes.  She has been smoking about 0.50 packs per day. She has never used smokeless tobacco. She reports that she does not drink alcohol or use illicit drugs. Allergies: No Known Allergies Medications Prior to Admission  Medication Sig Dispense Refill  . aspirin EC 81 MG tablet Take 81 mg by mouth daily.    . cholecalciferol (VITAMIN D) 1000 UNITS tablet Take 1,000 Units by mouth daily.    Marland Kitchen ibuprofen (ADVIL,MOTRIN) 200 MG tablet Take 200 mg by mouth every 6 (six) hours as needed.    . Minoxidil (ROGAINE EX) Apply 1 application topically daily.    . naproxen sodium (ANAPROX) 220 MG tablet Take 220 mg by mouth 2 (two) times daily with a meal.    . Omega-3 Fatty Acids (FISH OIL PO) Take 1 tablet by mouth daily.    Marland Kitchen RENOVA 0.02 % CREA Apply 1 application topically daily.  3  . simvastatin (ZOCOR) 20 MG tablet Take 1 tablet by mouth daily.  6  . vitamin E 100 UNIT capsule Take 100 Units by mouth daily.    . ondansetron (ZOFRAN ODT) 4 MG disintegrating tablet Take 1 tablet (4 mg total) by mouth every 8 (eight) hours as needed for nausea or vomiting. 2 tablet 0    Home: Home Living Family/patient expects to be discharged to:: Private residence Living Arrangements: Spouse/significant other Available Help at Discharge: Family, Friend(s) Type of Home: House Home Access: Stairs to enter [employer] of Steps: 2  Home Layout: One level Additional Comments: patient poor historian secondary to confusion at this time, began getting frustrated when she could not answer question regarding railings  Functional History: Prior Function Level of Independence:  Independent Comments: was going to yoga several times a week  Functional Status:  Mobility: Bed Mobility Overal bed mobility: Needs Assistance Bed Mobility: Supine to Sit Rolling: Supervision Supine to sit: Min assist Sit to supine: Min assist General bed mobility comments: cues fro attending to task and to slow down as pt a little impulsive.   Transfers Overall transfer level: Needs assistance Equipment used: 1 person hand held assist Transfers: Sit to/from Stand Sit to Stand: Min assist General transfer comment: cues for Ue use and attending to task.  pt generally unsteady and reaches out for furniture to hold.   Ambulation/Gait Ambulation/Gait assistance: Min assist Ambulation Distance (Feet): 30 Feet Assistive device: 1 person hand held assist Gait Pattern/deviations: Step-through pattern, Decreased stride length, Narrow base of support Gait velocity: decreased Gait velocity interpretation: Below normal speed for age/gender General Gait Details: pt unsteady and requires consistent MinA to maintain balance.  pt with narrow BOS and generally staggered gait.  pt with decreased awareness of deficits and has increased difficulty attending to task during mobility.      ADL:    Cognition: Cognition Overall Cognitive Status: Impaired/Different from baseline Orientation Level: Oriented to person, Disoriented to time, Disoriented to situation, Oriented to place Cognition Arousal/Alertness: Awake/alert Behavior During Therapy: Impulsive Overall Cognitive Status: Impaired/Different from baseline Area of Impairment: Orientation, Attention, Memory, Following commands, Safety/judgement, Awareness, Problem solving Orientation Level: Disoriented to, Time (pt able to state something is wrong with her brain. ) Current Attention Level: Focused Memory: Decreased short-term memory Following Commands: Follows one step commands consistently, Follows multi-step commands  inconsistently Safety/Judgement: Decreased awareness of safety, Decreased awareness of deficits Awareness: Intellectual Problem Solving: Slow processing, Decreased initiation, Difficulty sequencing, Requires verbal cues, Requires tactile cues General Comments: pt with very short attention and easily distracted.  pt gets irritated easily and needs gentle cueing along with distraction for participation.  pt able to state she was in the hospital, but only knew something was wrong with her brain when asked about situation.    Blood pressure 125/56, pulse 73, temperature 97.9 F (36.6 C), temperature source Oral, resp. rate 19, height 5\' 3"  (1.6 m), weight 63.4 kg (139 lb 12.4 oz), SpO2 95 %. Physical Exam  Vitals reviewed. Constitutional: She appears well-developed.  HENT:  Head: Normocephalic.  Eyes: EOM are normal.  Neck: Normal range of motion. Neck supple. No thyromegaly present.  Cardiovascular: Normal rate and regular rhythm.   Respiratory: Effort normal and breath sounds normal. No respiratory distress.  GI: Soft. Bowel sounds are normal. She exhibits no distension.  Neurological: She is alert.  Patient is oriented to name, age and date of birth. She does follow simple commands. Did quite well cognitively during initial part of exam but became oppositional when discussing date. Began to confabulate about knowing my wife and knowing me before. Mild left sided dysmetria and ataxia. RUE: 5/5 Deltoid, 5/5 Biceps, 5/5 Triceps, 5/5 Wrist Ext, 5/5 Grip LUE: 5/5 Deltoid, 5/5 Biceps, 5/5 Triceps, 5/5 Wrist Ext, 5/5 Grip RLE: HF 5/5, KE 5/5, KF 5/5, ADF 5/5, APF 5/5 LLE: HF 5/5, KE 5/5, HF, 5/5, ADF 5/5, APF 5/5 No sensory deficits  Skin: Skin is warm and dry.    Results for orders placed or performed during the hospital encounter of 03/02/14 (from the  past 24 hour(s))  Magnesium     Status: None   Collection Time: 03/08/14  2:53 AM  Result Value Ref Range   Magnesium 2.4 1.5 - 2.5 mg/dL   Phosphorus     Status: Abnormal   Collection Time: 03/08/14  2:53 AM  Result Value Ref Range   Phosphorus 2.0 (L) 2.3 - 4.6 mg/dL  Basic metabolic panel     Status: Abnormal   Collection Time: 03/08/14  2:53 AM  Result Value Ref Range   Sodium 141 135 - 145 mmol/L   Potassium 3.8 3.5 - 5.1 mmol/L   Chloride 112 96 - 112 mEq/L   CO2 24 19 - 32 mmol/L   Glucose, Bld 127 (H) 70 - 99 mg/dL   BUN 10 6 - 23 mg/dL   Creatinine, Ser 0.57 0.50 - 1.10 mg/dL   Calcium 7.5 (L) 8.4 - 10.5 mg/dL   GFR calc non Af Amer 87 (L) >90 mL/min   GFR calc Af Amer >90 >90 mL/min   Anion gap 5 5 - 15  CBC with Differential     Status: Abnormal   Collection Time: 03/08/14  2:53 AM  Result Value Ref Range   WBC 11.3 (H) 4.0 - 10.5 K/uL   RBC 3.33 (L) 3.87 - 5.11 MIL/uL   Hemoglobin 10.4 (L) 12.0 - 15.0 g/dL   HCT 31.5 (L) 36.0 - 46.0 %   MCV 94.6 78.0 - 100.0 fL   MCH 31.2 26.0 - 34.0 pg   MCHC 33.0 30.0 - 36.0 g/dL   RDW 13.3 11.5 - 15.5 %   Platelets 244 150 - 400 K/uL   Neutrophils Relative % 89 (H) 43 - 77 %   Neutro Abs 10.0 (H) 1.7 - 7.7 K/uL   Lymphocytes Relative 4 (L) 12 - 46 %   Lymphs Abs 0.4 (L) 0.7 - 4.0 K/uL   Monocytes Relative 7 3 - 12 %   Monocytes Absolute 0.8 0.1 - 1.0 K/uL   Eosinophils Relative 0 0 - 5 %   Eosinophils Absolute 0.0 0.0 - 0.7 K/uL   Basophils Relative 0 0 - 1 %   Basophils Absolute 0.0 0.0 - 0.1 K/uL   No results found.  Assessment/Plan: Diagnosis: right sided SAH, deconditioning 1. Does the need for close, 24 hr/day medical supervision in concert with the patient's rehab needs make it unreasonable for this patient to be served in a less intensive setting? Yes 2. Co-Morbidities requiring supervision/potential complications: altered mental status, acute encephalopathy 3. Due to bladder management, bowel management, safety, skin/wound care, disease management, medication administration, pain management and patient education, does the patient require 24 hr/day  rehab nursing? Yes 4. Does the patient require coordinated care of a physician, rehab nurse, PT (1-2 hrs/day, 5 days/week), OT (1-2 hrs/day, 5 days/week) and SLP (1-2 hrs/day, 5 days/week) to address physical and functional deficits in the context of the above medical diagnosis(es)? Yes Addressing deficits in the following areas: balance, endurance, locomotion, strength, transferring, bowel/bladder control, bathing, dressing, feeding, grooming, toileting, cognition, speech, language, swallowing and psychosocial support 5. Can the patient actively participate in an intensive therapy program of at least 3 hrs of therapy per day at least 5 days per week? Yes 6. The potential for patient to make measurable gains while on inpatient rehab is good 7. Anticipated functional outcomes upon discharge from inpatient rehab are supervision  with PT, supervision with OT, supervision and min assist with SLP. 8. Estimated rehab length of stay to reach  the above functional goals is: 8-12 days 9. Does the patient have adequate social supports and living environment to accommodate these discharge functional goals? Yes and Potentially 10. Anticipated D/C setting: Home 11. Anticipated post D/C treatments: HH therapy and Outpatient therapy 12. Overall Rehab/Functional Prognosis: excellent  RECOMMENDATIONS: This patient's condition is appropriate for continued rehabilitative care in the following setting: CIR Patient has agreed to participate in recommended program. Yes/potentially Note that insurance prior authorization may be required for reimbursement for recommended care.  Comment: Rehab Admissions Coordinator to follow up.  Thanks,  Meredith Staggers, MD, Mellody Drown     03/08/2014

## 2014-03-08 NOTE — Progress Notes (Signed)
Speech Language Pathology Treatment: Dysphagia  Patient Details Name: Sharon [name] MRN: [id number] DOB: [birth year] Today's Date: 03/08/2014 Time: 3448-3015 SLP Time Calculation (min) (ACUTE ONLY): 27 min  Assessment / Plan / Recommendation Clinical Impression  Pt needed encouragement to eat with son and boyfriend present. She demonstrated impulsivity when drinking and required min verbal cues for smaller sips. No difficulty masticating soft solids on tray (pancake, eggs, did not consume sausage). No indications of aspiration. Although cognition is improving (per son) pt demonstrating cognitive   Difficulty. 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 Will discharge speech for swallow, however recommend orders for cognitive assessment; please order if agree.   HPI HPI: 80 year old female admitted 02/20/14 due to AMS, aneurysmal SAH. PMH significant for DLD. CXR negative, CT indicated nothing acute. MRI revealed Focal extension from the right posterior communicating artery compatible with any elongated aneurysm, measuring up to 11 mm.   Pertinent Vitals Pain Assessment: No/denies pain  SLP Plan  Discharge SLP treatment due to (comment);All goals met    Recommendations Diet recommendations: Regular;Thin liquid Liquids provided via: Straw;Cup Medication Administration: Whole meds with liquid Supervision: Patient able to self feed;Intermittent supervision to cue for compensatory strategies Compensations: Slow rate;Small sips/bites Postural Changes and/or Swallow Maneuvers: Seated upright 90 degrees;Upright 30-60 min after meal              Oral Care Recommendations: Oral care BID Follow up Recommendations: 24 hour supervision/assistance Plan: Discharge SLP treatment due to (comment);All goals met    GO     Houston Siren 03/08/2014, 9:17 AM   Orbie Pyo Colvin Caroli.Ed [employer] [phone]

## 2014-03-08 NOTE — Progress Notes (Addendum)
VASCULAR LAB PRELIMINARY  PRELIMINARY  PRELIMINARY  PRELIMINARY  Transcranial Doppler  Date POD PCO2 HCT BP  MCA ACA PCA OPHT SIPH VERT Basilar  03-08-14 SB     Right  Left   66  58   --  -54   --  -67   17  21   39  47   -19  -41     -47         Right  Left                                            Right  Left                                             Right  Left                                             Right  Left                                            Right  Left                                            Right  Left                                        MCA = Middle Cerebral Artery      OPHT = Opthalmic Artery     BASILAR = Basilar Artery   ACA = Anterior Cerebral Artery     SIPH = Carotid Siphon PCA = Posterior Cerebral Artery   VERT = Verterbral Artery                   Normal MCA = 62+\-12 ACA = 50+\-12 PCA = 42+\-23         Risa Auman, RVT 03/08/2014, 12:31 PM

## 2014-03-08 NOTE — Progress Notes (Signed)
PULMONARY / CRITICAL CARE MEDICINE   Name: Sharon [name] MRN: [id number] DOB: 25-Dec-1934    ADMISSION DATE:  03/02/2014 CONSULTATION DATE:  03/05/14  REFERRING MD :  Dr. Garen Grams  REASON FOR CONSULTATION: Ventilator-dependent respiratory failure  INITIAL PRESENTATION:  80 year old woman, active smoker with a history of arthritis. She began to have headache and possibly some associated weakness dating back to the end of November. Since that time she had experienced some increased somnolence and off-and-on headache. Then 12/28 she was noted to be disoriented. She was admitted 12/29 for neurological evaluation. Lumbar puncture was consistent with subarachnoid bleeding. 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 She underwent coiling of the aneurysm on 03/05/14. Post procedure she was admitted to the ICU on mechanical ventilation.   STUDIES:  Head CT 12/29 >> indeterminate punctate calcification in the medial aspect of the right middle cranial fossa, possibly small meningioma LP 12/31 > xanthochromic, 7.8K RBC, 158 WBC (41 neutrophils). Glucose 36. Total protein 84. Gram stain negative CSF HSV 12/31 >>  Brain MRI 12/31 >> focal diffusion signal in the right sylvian fissure, right posterior indicating arterial elongated aneurysm, dilation of the lateral ventricles but no evidence of hydrocephalus   03/05/14 >> coiling of the right posterior communicating artery aneurysm. 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 She denies any pains at this time. No overnight events.   VITAL SIGNS: Temp:  [97.3 F (36.3 C)-99 F (37.2 C)] 99 F (37.2 C) (01/04 1945) Pulse Rate:  [39-83] 67 (01/04 2100) Resp:  [11-28] 16 (01/04 2100) BP: (113-157)/(50-102) 136/63 mmHg  (01/04 2100) SpO2:  [80 %-100 %] 93 % (01/04 2100) Weight:  [139 lb 12.4 oz (63.4 kg)] 139 lb 12.4 oz (63.4 kg) (01/04 0400) HEMODYNAMICS:   VENTILATOR SETTINGS:   INTAKE / OUTPUT:  Intake/Output Summary (Last 24 hours) at 03/08/14 2312 Last data filed at 03/08/14 1900  Gross per 24 hour  Intake 3631.25 ml  Output    826 ml  Net 2805.25 ml    PHYSICAL EXAMINATION: General: frail woman on 2L O2 via [place] and breathing comfortably. Sitter in room. Neuro:  But pleasantly confused but able to answer questions HEENT:  Moist mucus membranes cardiovascular:  regular Lungs:  No wheeze Abdomen:  Soft, non tender Musculoskeletal:  No edema Skin:  No rashes  LABS:  CBC  Recent Labs Lab 03/06/14 0138 03/07/14 0225 03/08/14 0253  WBC 13.0* 11.2* 11.3*  HGB 10.9* 9.7* 10.4*  HCT 31.9* 29.6* 31.5*  PLT 236 237 244   Coag's  Recent Labs Lab 03/02/14 1048  INR 1.01   BMET  Recent Labs Lab 03/06/14 0138 03/07/14 0225 03/08/14 0253  NA 138 138 141  K 4.0 3.3* 3.8  CL 111 112 112  CO2 23 23 24   BUN 13 14 10   CREATININE 0.59 0.49* 0.57  GLUCOSE 137* 144* 127*   Electrolytes  Recent Labs Lab 03/06/14 0138 03/07/14 0225 03/08/14 0253  CALCIUM 6.7* 7.0* 7.5*  MG  --  2.4 2.4  PHOS  --  1.9* 2.0*   Sepsis Markers  Recent Labs Lab 03/02/14 1132  LATICACIDVEN 0.67   ABG  Recent Labs Lab 03/05/14 0333  PHART 7.396  PCO2ART 36.5  PO2ART 128.0*     Liver Enzymes  Recent Labs Lab 03/02/14  1048 03/05/14 0500  AST 21 16  ALT 12 11  ALKPHOS 51 41  BILITOT 0.7 0.6  ALBUMIN 4.0 2.8*   Cardiac Enzymes No results for input(s): TROPONINI, PROBNP in the last 168 hours.   Glucose  Recent Labs Lab 03/02/14 1042 03/05/14 1955 03/05/14 2329 03/06/14 0322  GLUCAP 111* 129* 126* 124*    Imaging No results found.   ASSESSMENT / PLAN:  PULMONARY ETT 03/05/14 >> 03/06/14 A:  Acute respiratory failure post procedure. History of tobacco use.   -  s/p successful extubation 02/03/15 and remains stable.  P:   Breathing well on supplementary O2. Will wean off if able.  Monitor clinically   CARDIOVASCULAR A:  Hx of HLD. P:  Continue zocor May discont IVF  RENAL A:   Low k and phos  P:   Replete electrolytes as needed.   GASTROINTESTINAL A:   Nutrition.  13/1/16- some loose stools P:   Patient feeding okay per nursing staff. May discont IVF if neurosurgery. Per neurosurge for food timing Change Protonix to H2 blockade for SUP C diff -NEGATIVE  HEMATOLOGIC A:   DVT prophylaxis. P:  SCD  INFECTIOUS\ Results for orders placed or performed during the hospital encounter of 03/02/14  Urine culture     Status: None   Collection Time: 03/02/14 11:40 AM  Result Value Ref Range Status   Specimen Description URINE, CLEAN CATCH  Final   Special Requests ADDED [id number]  Final   Colony Count   Final    >=100,000 COLONIES/ML Performed at [facility]    Culture   Final    Multiple bacterial morphotypes present, none predominant. Suggest appropriate recollection if clinically indicated. Performed at [facility]    Report Status 03/04/2014 FINAL  Final  Culture, blood (routine x 2)     Status: None (Preliminary result)   Collection Time: 03/03/14  1:51 AM  Result Value Ref Range Status   Specimen Description BLOOD LEFT HAND  Final   Special Requests BOTTLES DRAWN AEROBIC ONLY 6CC  Final   Culture   Final           BLOOD CULTURE RECEIVED NO GROWTH TO DATE CULTURE WILL BE HELD FOR 5 DAYS BEFORE ISSUING A FINAL NEGATIVE REPORT Performed at [facility]    Report Status PENDING  Incomplete  Culture, blood (routine x 2)     Status: None (Preliminary result)   Collection Time: 03/03/14  8:22 AM  Result Value Ref Range Status   Specimen Description BLOOD LEFT ARM  Final   Special Requests BOTTLES DRAWN AEROBIC AND ANAEROBIC 10CC  Final   Culture   Final           BLOOD CULTURE RECEIVED NO  GROWTH TO DATE CULTURE WILL BE HELD FOR 5 DAYS BEFORE ISSUING A FINAL NEGATIVE REPORT Performed at [facility]    Report Status PENDING  Incomplete  Gram stain     Status: None   Collection Time: 03/04/14  3:00 PM  Result Value Ref Range Status   Specimen Description CSF  Final   Special Requests CSF FLUID NO 2 3.5CC  Final   Gram Stain   Final    CYTOSPIN PREP WBC PRESENT,BOTH PMN AND MONONUCLEAR NO ORGANISMS SEEN    Report Status 03/04/2014 FINAL  Final  CSF culture     Status: None   Collection Time: 03/04/14  3:00 PM  Result Value Ref Range Status   Specimen Description CSF  Final   Special Requests NONE  Final   Gram Stain   Final    WBC PRESENT,BOTH PMN AND MONONUCLEAR NO ORGANISMS SEEN CYTOSPIN Performed at [facility] Performed at [facility]    Culture   Final    NO GROWTH 3 DAYS Performed at [facility]    Report Status 03/08/2014 FINAL  Final  MRSA PCR Screening     Status: None   Collection Time: 03/04/14  7:32 PM  Result Value Ref Range Status   MRSA by PCR NEGATIVE NEGATIVE Final    Comment:        The GeneXpert MRSA Assay (FDA approved for NASAL specimens only), is one component of a comprehensive MRSA colonization surveillance program. It is not intended to diagnose MRSA infection nor to guide or monitor treatment for MRSA infections.   Clostridium Difficile by PCR     Status: None   Collection Time: 03/07/14  5:43 AM  Result Value Ref Range Status   C difficile by pcr NEGATIVE NEGATIVE Final    A:   Fever with ?menigitis >> seems unlikely. Fever likely was due to [facility] P:   Monitor off abx  Ceftriaxone 12/30 >> 1/01 Ampicillin 12/30 >> 1/01 Acyclovir 12/30 >> 1/01 Vancomycin 12/31 >> 1/01  ENDOCRINE A:   No acute issues. P:   Plasma glucose level elevated mildly to 137 likely due to steroids No interventions needed at this time  NEUROLOGIC A:   Acute encephalopathy 2nd to grade 3 SAH. Improved  and appears stable though still somehow confused   P:   Nimotop and Zocor for vasospasm prophylaxis to continue per neurosurgery Continue saline at 159ml/H per nuerosurgery. Will decide when to discontinue Still on decadron per neurosurgery to clarify  No family at bedside when I evaluated the patient. She still needs a sitter at bedside.  Patient awaiting placement into CIR.  [facility] will take over care starting 03/09/2014 morning.   Case discussed with Dr Lake Bells.   Signed:  Jessee Avers, MD PGY-3 Internal Medicine Teaching Service Pager: [phone] 03/09/2014, 12:12 AM

## 2014-03-08 NOTE — Plan of Care (Signed)
Problem: Phase II Progression Outcomes Goal: Tolerating diet Outcome: Completed/Met Date Met:  03/08/14 Pt on dysphagia 3 diet thin liquid; tolerating fine.

## 2014-03-09 DIAGNOSIS — R4182 Altered mental status, unspecified: Secondary | ICD-10-CM

## 2014-03-09 LAB — CBC WITH DIFFERENTIAL/PLATELET
BASOS ABS: 0 10*3/uL (ref 0.0–0.1)
Basophils Absolute: 0 10*3/uL (ref 0.0–0.1)
Basophils Relative: 0 % (ref 0–1)
Basophils Relative: 0 % (ref 0–1)
EOS ABS: 0 10*3/uL (ref 0.0–0.7)
EOS PCT: 0 % (ref 0–5)
Eosinophils Absolute: 0 10*3/uL (ref 0.0–0.7)
Eosinophils Relative: 0 % (ref 0–5)
HCT: 32.9 % — ABNORMAL LOW (ref 36.0–46.0)
HEMATOCRIT: 31.9 % — AB (ref 36.0–46.0)
Hemoglobin: 10.7 g/dL — ABNORMAL LOW (ref 12.0–15.0)
Hemoglobin: 10.8 g/dL — ABNORMAL LOW (ref 12.0–15.0)
LYMPHS PCT: 5 % — AB (ref 12–46)
Lymphocytes Relative: 5 % — ABNORMAL LOW (ref 12–46)
Lymphs Abs: 0.5 10*3/uL — ABNORMAL LOW (ref 0.7–4.0)
Lymphs Abs: 0.5 10*3/uL — ABNORMAL LOW (ref 0.7–4.0)
MCH: 31.5 pg (ref 26.0–34.0)
MCH: 31.2 pg (ref 26.0–34.0)
MCHC: 33.5 g/dL (ref 30.0–36.0)
MCHC: 32.8 g/dL (ref 30.0–36.0)
MCV: 93.8 fL (ref 78.0–100.0)
MCV: 95.1 fL (ref 78.0–100.0)
MONOS PCT: 7 % (ref 3–12)
Monocytes Absolute: 0.7 10*3/uL (ref 0.1–1.0)
Monocytes Absolute: 0.8 10*3/uL (ref 0.1–1.0)
Monocytes Relative: 8 % (ref 3–12)
NEUTROS ABS: 9 10*3/uL — AB (ref 1.7–7.7)
Neutro Abs: 8.9 10*3/uL — ABNORMAL HIGH (ref 1.7–7.7)
Neutrophils Relative %: 88 % — ABNORMAL HIGH (ref 43–77)
Neutrophils Relative %: 87 % — ABNORMAL HIGH (ref 43–77)
PLATELETS: 258 10*3/uL (ref 150–400)
Platelets: 237 10*3/uL (ref 150–400)
RBC: 3.4 MIL/uL — ABNORMAL LOW (ref 3.87–5.11)
RBC: 3.46 MIL/uL — ABNORMAL LOW (ref 3.87–5.11)
RDW: 13.4 % (ref 11.5–15.5)
RDW: 13.5 % (ref 11.5–15.5)
WBC: 10.2 10*3/uL (ref 4.0–10.5)
WBC: 10.2 10*3/uL (ref 4.0–10.5)

## 2014-03-09 LAB — CULTURE, BLOOD (ROUTINE X 2)
Culture: NO GROWTH
Culture: NO GROWTH

## 2014-03-09 LAB — BASIC METABOLIC PANEL
ANION GAP: 7 (ref 5–15)
BUN: <5 mg/dL — ABNORMAL LOW (ref 6–23)
CALCIUM: 7.5 mg/dL — AB (ref 8.4–10.5)
CO2: 24 mmol/L (ref 19–32)
Chloride: 107 mEq/L (ref 96–112)
Creatinine, Ser: 0.47 mg/dL — ABNORMAL LOW (ref 0.50–1.10)
GFR calc Af Amer: >90 mL/min (ref >90)
Glucose, Bld: 128 mg/dL — ABNORMAL HIGH (ref 70–99)
Potassium: 4 mmol/L (ref 3.5–5.1)
SODIUM: 138 mmol/L (ref 135–145)

## 2014-03-09 LAB — PATHOLOGIST SMEAR REVIEW

## 2014-03-09 LAB — PHOSPHORUS: Phosphorus: 2.4 mg/dL (ref 2.3–4.6)

## 2014-03-09 LAB — MAGNESIUM: MAGNESIUM: 2 mg/dL (ref 1.5–2.5)

## 2014-03-09 MED ORDER — ENSURE COMPLETE PO LIQD
237.0000 mL | Freq: Three times a day (TID) | ORAL | Status: DC
  Administered 2014-03-10 (×3): 237 mL via ORAL

## 2014-03-09 NOTE — Progress Notes (Signed)
[place] TEAM 1 - Stepdown/ICU TEAM Progress Note  Sharon [name] KVQ:[id number] DOB: 02/13/1935 DOA: 03/02/2014 PCP: Osborne Casco, MD  Admit HPI / Brief Narrative: 80 y.o. WF PMHx SAH, meningitis, Dyslipidemia. Presented to the ED with ultra mental status. Patient is unable to provide any history secondary to altered mental status. History was obtained by discussing with patient's boyfriend Mr. Sharon [name] via phone. Patient apparently experienced an episode of headache associated with weakness during her dinner party and of November. After coming home, patient's boyfriend called a neighbor who is an oncologist who evaluated patient and indicated that she did not have a stroke clinically. However since then, patient apparently has had intermittent headache and sleeps more than usual. She however was coherent until sometime yesterday when she was slightly disoriented. He took her to the PCPs office and some lab work were done but they were not given any diagnosis. Today when her grandson went to see her, she was more confused and hence was brought to the ED. In the ED, lab work is unremarkable. Chest x-ray is negative. CT head is without acute intracranial process but shows indeterminate punctate calcification within the medial aspect of the right middle cranial foci which could potentially represent a small meningioma. No reported fever, chills, cough, dyspnea, nausea, vomiting, dysuria or urinary frequency. Hospitalist admission was requested.   HPI/Subjective: 1/5 A/O 3 (does not know where), states negative CP, negative SOB, negative headache, negative double vision. Patient in very good spirits joking with medical staff.  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    Consultants: Vashti Hey (neurology) Dr. Newman Pies (neurosurgery) Dr. Merrie Roof [facility] M)    Procedure/Significant Events: Head CT 12/29 >> indeterminate punctate calcification in the medial aspect of the right middle cranial fossa, possibly small meningioma LP 12/31 > xanthochromic, 7.8K RBC, 158 WBC (41 neutrophils). Glucose 36. Total protein 84. Gram stain negative CSF HSV 12/31 >>  Brain MRI 12/31 >> focal diffusion signal in the right sylvian fissure, right posterior indicating arterial elongated aneurysm, dilation of the lateral ventricles but no evidence of hydrocephalus 1/1 coiling of the right posterior communicating artery aneurysm; grade 3 subarachnoid hemorrhage likely secondary to subclinical aneurysmal hemorrhage  1/2 CT head without contrast; Right middle cerebral artery aneurysm clipping again identified. - No evidence hemorrhage/extra-axial fluid. -No vascular territory infarct. -Ventricles unchanged with no evidence of interval dilatation.     Culture 12/29 urine culture positive multiple bacterial morphotypes  12/30 blood left hand/arm negative 12/31 CSF negative 12/31 MRSA by PCR negative 1/3 C. difficile by PCR pending   Antibiotics: Ceftriaxone 12/29 >> stopped 12/30 Vancomycin 12/30 >> stopped 12/30 Acyclovir 12/30 >> stopped 12/31 Ampicillin 12/30 >> stopped 12/31  DVT prophylaxis: SCD   Devices NA  LINES / TUBES:      Continuous Infusions: . sodium chloride 125 mL/hr at 03/09/14 0810  . dexmedetomidine      Objective: VITAL SIGNS: Temp: 98.4 F (36.9 C) (01/05 0800)  Temp Source: Oral (01/05 0800) BP:  123/62 mmHg (01/05 1200) Pulse Rate: 59 (01/05 1100) SPO2; FIO2:   Intake/Output Summary (Last 24 hours) at 03/09/14 1252 Last data filed at 03/09/14 1200  Gross per 24 hour  Intake 2518.25 ml  Output    500 ml  Net 2018.25 ml     Exam: General: A/O 3, (does not know where), NAD, No acute respiratory distress Lungs: Clear to auscultation bilaterally without wheezes or crackles Cardiovascular: Regular rate and rhythm without murmur gallop or rub normal S1 and S2 Abdomen: Nontender, nondistended, soft, bowel sounds positive, no rebound, no ascites, no appreciable mass Extremities: No significant cyanosis, clubbing, or edema bilateral lower extremities Neurologic; cranial nerves II through XII intact, tongue/uvula midline, extremity strength 5/5, sensation intact throughout, finger nose finger within normal limits bilateral, quick finger touches within normal limits bilateral, did not ambulate patient.  Data Reviewed: Basic Metabolic Panel:  Recent Labs Lab 03/05/14 0500 03/06/14 0138 03/07/14 0225 03/08/14 0253 03/09/14 0220  NA 135 138 138 141 138  K 3.5 4.0 3.3* 3.8 4.0  CL 105 111 112 112 107  CO2 23 23 23 24 24   GLUCOSE 140* 137* 144* 127* 128*  BUN 11 13 14 10  <5*  CREATININE 0.63 0.59 0.49* 0.57 0.47*  CALCIUM 7.4* 6.7* 7.0* 7.5* 7.5*  MG  --   --  2.4 2.4 2.0  PHOS  --   --  1.9* 2.0* 2.4   Liver Function Tests:  Recent Labs Lab 03/05/14 0500  AST 16  ALT 11  ALKPHOS 41  BILITOT 0.6  PROT 5.3*  ALBUMIN 2.8*   No results for input(s): LIPASE, AMYLASE in the last 168 hours. No results for input(s): AMMONIA in the last 168 hours. CBC:  Recent Labs Lab 03/05/14 0500 03/06/14 0138 03/07/14 0225 03/08/14 0253 03/09/14 0220  WBC 9.5 13.0* 11.2* 11.3* 10.2  NEUTROABS 8.8*  --  10.1* 10.0* 9.0*  HGB 11.4* 10.9* 9.7* 10.4* 10.7*  HCT 33.5* 31.9* 29.6* 31.5* 31.9*  MCV 94.9 96.4 95.5 94.6 93.8  PLT 246 236 237 244 237   Cardiac Enzymes: No results  for input(s): CKTOTAL, CKMB, CKMBINDEX, TROPONINI in the last 168 hours. BNP (last 3 results) No results for input(s): PROBNP in the last 8760 hours. CBG:  Recent Labs Lab 03/05/14 1955 03/05/14 2329 03/06/14 0322  GLUCAP 129* 126* 124*    Recent Results (from the past 240 hour(s))  Urine culture     Status: None   Collection Time: 03/02/14 11:40 AM  Result Value Ref Range Status   Specimen Description URINE, CLEAN CATCH  Final   Special Requests ADDED [id number]  Final   Colony Count   Final    >=100,000 COLONIES/ML Performed at [facility]    Culture   Final    Multiple bacterial morphotypes present, none predominant. Suggest appropriate recollection if clinically indicated. Performed at [facility]    Report Status 03/04/2014 FINAL  Final  Culture, blood (routine x 2)     Status: None   Collection Time: 03/03/14  1:51 AM  Result Value Ref Range Status   Specimen Description BLOOD LEFT HAND  Final   Special Requests BOTTLES DRAWN AEROBIC ONLY [place]  Final   Culture   Final    NO GROWTH 5 DAYS Performed at [facility]    Report Status 03/09/2014 FINAL  Final  Culture, blood (routine x 2)     Status: None   Collection Time:  03/03/14  8:22 AM  Result Value Ref Range Status   Specimen Description BLOOD LEFT ARM  Final   Special Requests BOTTLES DRAWN AEROBIC AND ANAEROBIC 10CC  Final   Culture   Final    NO GROWTH 5 DAYS Performed at [facility]    Report Status 03/09/2014 FINAL  Final  Gram stain     Status: None   Collection Time: 03/04/14  3:00 PM  Result Value Ref Range Status   Specimen Description CSF  Final   Special Requests CSF FLUID NO 2 3.5CC  Final   Gram Stain   Final    CYTOSPIN PREP WBC PRESENT,BOTH PMN AND MONONUCLEAR NO ORGANISMS SEEN    Report Status 03/04/2014 FINAL  Final  CSF culture     Status: None   Collection Time: 03/04/14  3:00 PM  Result Value Ref Range Status   Specimen Description CSF  Final     Special Requests NONE  Final   Gram Stain   Final    WBC PRESENT,BOTH PMN AND MONONUCLEAR NO ORGANISMS SEEN CYTOSPIN Performed at [facility] Performed at [facility]    Culture   Final    NO GROWTH 3 DAYS Performed at [facility]    Report Status 03/08/2014 FINAL  Final  MRSA PCR Screening     Status: None   Collection Time: 03/04/14  7:32 PM  Result Value Ref Range Status   MRSA by PCR NEGATIVE NEGATIVE Final    Comment:        The GeneXpert MRSA Assay (FDA approved for NASAL specimens only), is one component of a comprehensive MRSA colonization surveillance program. It is not intended to diagnose MRSA infection nor to guide or monitor treatment for MRSA infections.   Clostridium Difficile by PCR     Status: None   Collection Time: 03/07/14  5:43 AM  Result Value Ref Range Status   C difficile by pcr NEGATIVE NEGATIVE Final     Studies:  Recent x-ray studies have been reviewed in detail by the Attending Physician  Scheduled Meds:  Scheduled Meds: . antiseptic oral rinse  7 mL Mouth Rinse BID  . dexamethasone  4 mg Intravenous 4 times per day  . famotidine  20 mg Oral Daily  . feeding supplement (ENSURE COMPLETE)  237 mL Oral TID WC  . NiMODipine  60 mg Oral Q4H  . simvastatin  20 mg Oral q1800    Time spent on care of this patient: 40 mins   Allie Bossier , MD   [facility] Office  [phone] Pager - [phone]  On-Call/Text Page:      [email or web address]      password [email or web address]  If 7PM-7AM, please contact night-coverage [email or web address] Password [email or web address] 03/09/2014, 12:52 PM   LOS: 7 days

## 2014-03-09 NOTE — Progress Notes (Signed)
No new events or problems overnight. Patient remains moderately confused with intermittent periods of the lucidity.  Afebrile. Vitals are stable. Minimal meningeal stiffness. Speech fluent. Cranial nerve function intact. Motor and sensory function extremities normal.  Status post aneurysmal subarachnoid hemorrhage with subsequent coiling. Patient overall doing reasonably well. Still confused. Check follow-up head CT scan tomorrow for evaluation of progressive hydrocephalus.

## 2014-03-09 NOTE — Progress Notes (Signed)
UR completed.  CIR following for potential admission when medically stable.   Sandi Mariscal, RN BSN [place] CCM Trauma/Neuro ICU Case Manager [phone]

## 2014-03-09 NOTE — Progress Notes (Signed)
Rehab admissions - Evaluated for possible admission.  I met with patient who is confused and has a sitter at the bedside.  She did give me permission to talk with her family.  I spoke with Ole, her significant other and with her daughter, Legrand Rams.  Family are hopeful for inpatient rehab here at [facility].  Once patient is medically ready, we can determine the most appropriate venue of care.  I discussed inpatient rehab, SNF and [place] therapies with her family.  Call me for questions.  #[phone]

## 2014-03-09 NOTE — Progress Notes (Signed)
NUTRITION FOLLOW-UP  INTERVENTION: Increase Ensure Complete po to TID, each supplement provides 350 kcal and 13 grams of protein.  Encourage adequate PO intake.  NUTRITION DIAGNOSIS: Inadequate oral intake related to confusion, decreased appetite as evidenced by meal completion of <25%; ongoing.   Goal: Pt to meet >/= 90% of their estimated nutrition needs, not met.   Monitor:  PO intake, weight trends, labs, I/O's  ASSESSMENT: Pt admitted with SAH, s/p coiling 1/1. Pt remains somewhat confused.   Pt states no recent weight loss and good appetite, however per staff pt eating poorly. Meal completion <25%, but does like ensure.  Unable to determine cause of muscle depletion.   Nutrition Focused Physical Exam:  Subcutaneous Fat:  Orbital Region: WDL Upper Arm Region: WDL Thoracic and Lumbar Region: WDL  Muscle:  Temple Region: WDL Clavicle Bone Region: severe depletion Clavicle and Acromion Bone Region: severe depletion Scapular Bone Region: mild depletion Dorsal Hand: WDL Patellar Region: WDL Anterior Thigh Region: mild/moderate depletion Posterior Calf Region: mild/moderate depletion   Edema: not present   Height: Ht Readings from Last 1 Encounters:  03/05/14 _0  (1.6 m)    Weight: Wt Readings from Last 1 Encounters:  03/09/14 131 lb 6.3 oz (59.6 kg)  Admission weight: 121 lb 12/29   Estimated Nutritional Needs: Kcal: 1650-1850 Protein: 65-75 grams Fluid: 1.65- 1.85 L/day  Skin: no issues noted  Diet Order: DIET DYS 3 Meal Completion: <25%   Intake/Output Summary (Last 24 hours) at 03/09/14 1207 Last data filed at 03/09/14 1100  Gross per 24 hour  Intake 2393.25 ml  Output    500 ml  Net 1893.25 ml    Last BM: 1/5 - diarrhea  Labs:   Recent Labs Lab 03/07/14 0225 03/08/14 0253 03/09/14 0220  NA 138 141 138  K 3.3* 3.8 4.0  CL 112 112 107  CO2 _1 BUN 14 10 <5*  CREATININE 0.49* 0.57 0.47*  CALCIUM 7.0* 7.5* 7.5*  MG 2.4  2.4 2.0  PHOS 1.9* 2.0* 2.4  GLUCOSE 144* 127* 128*    CBG (last 3)  No results for input(s): GLUCAP in the last 72 hours.  Scheduled Meds: . antiseptic oral rinse  7 mL Mouth Rinse BID  . dexamethasone  4 mg Intravenous 4 times per day  . famotidine  20 mg Oral Daily  . feeding supplement (ENSURE COMPLETE)  237 mL Oral BID BM  . NiMODipine  60 mg Oral Q4H  . simvastatin  20 mg Oral q1800    Continuous Infusions: . sodium chloride 125 mL/hr at 03/09/14 0810  . dexmedetomidine     [place], [place], [place] Pager [phone] After Hours Pager

## 2014-03-10 ENCOUNTER — Inpatient Hospital Stay (HOSPITAL_COMMUNITY): Payer: Medicare Other

## 2014-03-10 ENCOUNTER — Encounter (HOSPITAL_COMMUNITY): Payer: Self-pay | Admitting: Radiology

## 2014-03-10 LAB — URINALYSIS, ROUTINE W REFLEX MICROSCOPIC
Bilirubin Urine: NEGATIVE
Glucose, UA: NEGATIVE mg/dL
HGB URINE DIPSTICK: NEGATIVE
Ketones, ur: NEGATIVE mg/dL
Leukocytes, UA: NEGATIVE
NITRITE: NEGATIVE
Protein, ur: NEGATIVE mg/dL
Specific Gravity, Urine: 1.016 (ref 1.005–1.030)
Urobilinogen, UA: 0.2 mg/dL (ref 0.0–1.0)
pH: 7 (ref 5.0–8.0)

## 2014-03-10 LAB — COMPREHENSIVE METABOLIC PANEL
ALT: 46 U/L — ABNORMAL HIGH (ref 0–35)
ANION GAP: 5 (ref 5–15)
AST: 27 U/L (ref 0–37)
Albumin: 2.9 g/dL — ABNORMAL LOW (ref 3.5–5.2)
Alkaline Phosphatase: 67 U/L (ref 39–117)
BUN: 7 mg/dL (ref 6–23)
CO2: 28 mmol/L (ref 19–32)
Calcium: 8 mg/dL — ABNORMAL LOW (ref 8.4–10.5)
Chloride: 107 mEq/L (ref 96–112)
Creatinine, Ser: 0.54 mg/dL (ref 0.50–1.10)
GFR calc Af Amer: >90 mL/min (ref >90)
GFR calc non Af Amer: 89 mL/min — ABNORMAL LOW (ref >90)
Glucose, Bld: 129 mg/dL — ABNORMAL HIGH (ref 70–99)
POTASSIUM: 4.1 mmol/L (ref 3.5–5.1)
Sodium: 140 mmol/L (ref 135–145)
Total Bilirubin: 0.4 mg/dL (ref 0.3–1.2)
Total Protein: 5 g/dL — ABNORMAL LOW (ref 6.0–8.3)

## 2014-03-10 LAB — CBC WITH DIFFERENTIAL/PLATELET
BASOS ABS: 0 10*3/uL (ref 0.0–0.1)
Basophils Relative: 0 % (ref 0–1)
Eosinophils Absolute: 0 10*3/uL (ref 0.0–0.7)
Eosinophils Relative: 0 % (ref 0–5)
HCT: 33.8 % — ABNORMAL LOW (ref 36.0–46.0)
Hemoglobin: 11.5 g/dL — ABNORMAL LOW (ref 12.0–15.0)
LYMPHS ABS: 0.4 10*3/uL — AB (ref 0.7–4.0)
Lymphocytes Relative: 4 % — ABNORMAL LOW (ref 12–46)
MCH: 32.7 pg (ref 26.0–34.0)
MCHC: 34 g/dL (ref 30.0–36.0)
MCV: 96 fL (ref 78.0–100.0)
MONOS PCT: 6 % (ref 3–12)
Monocytes Absolute: 0.7 10*3/uL (ref 0.1–1.0)
Neutro Abs: 9.2 10*3/uL — ABNORMAL HIGH (ref 1.7–7.7)
Neutrophils Relative %: 90 % — ABNORMAL HIGH (ref 43–77)
Platelets: 246 10*3/uL (ref 150–400)
RBC: 3.52 MIL/uL — ABNORMAL LOW (ref 3.87–5.11)
RDW: 13.5 % (ref 11.5–15.5)
WBC: 10.3 10*3/uL (ref 4.0–10.5)

## 2014-03-10 LAB — LIPID PANEL
Cholesterol: 150 mg/dL (ref 0–200)
HDL: 61 mg/dL (ref >39)
LDL Cholesterol: 74 mg/dL (ref 0–99)
Total CHOL/HDL Ratio: 2.5 RATIO
Triglycerides: 77 mg/dL (ref <150)
VLDL: 15 mg/dL (ref 0–40)

## 2014-03-10 LAB — AMMONIA: Ammonia: 18 umol/L (ref 11–32)

## 2014-03-10 LAB — RETICULOCYTES
RBC.: 3.9 MIL/uL (ref 3.87–5.11)
Retic Count, Absolute: 89.7 10*3/uL (ref 19.0–186.0)
Retic Ct Pct: 2.3 % (ref 0.4–3.1)

## 2014-03-10 LAB — MAGNESIUM: Magnesium: 2 mg/dL (ref 1.5–2.5)

## 2014-03-10 LAB — PHOSPHORUS: PHOSPHORUS: 2.6 mg/dL (ref 2.3–4.6)

## 2014-03-10 NOTE — Anesthesia Postprocedure Evaluation (Signed)
  Anesthesia Post-op Note  Patient: Sharon [name]  Procedure(s) Performed: Procedure(s): RADIOLOGY WITH ANESTHESIA (N/A)  Patient Location: MRI  Anesthesia Type:General  Level of Consciousness: awake and confused  Airway and Oxygen Therapy: Patient Spontanous Breathing and Patient connected to nasal cannula oxygen  Post-op Pain: none  Post-op Assessment: Post-op Vital signs reviewed, Patient's Cardiovascular Status Stable, Respiratory Function Stable, Patent Airway, No signs of Nausea or vomiting and Pain level controlled  Post-op Vital Signs: Reviewed and stable  Last Vitals:  Filed Vitals:   03/10/14 1600  BP: 115/47  Pulse:   Temp: 36.3 C  Resp: 16    Complications: No apparent anesthesia complications

## 2014-03-10 NOTE — Progress Notes (Signed)
[place] TEAM 1 - Stepdown/ICU TEAM Progress Note  TNYA ADES WPY:[id number] DOB: 1934-08-31 DOA: 03/02/2014 PCP: Osborne Casco, MD  Admit HPI / Brief Narrative: 80 y.o. F Hx SAH, meningitis, and dyslipidemia who presented to the ED with altered mental status. Patient apparently experienced an episode of headache associated with weakness in November. Patient was evaluated by a neighbor who is an Materials engineer who indicated that she did not have a stroke clinically.  Since that time the patient apparently had intermittent headaches and was sleeping more than usual. The day of her admission she developed new disorientation and was eventually brought to the ED.   In the ED CT head was without acute intracranial process but noted indeterminate punctate calcification within the medial aspect of the right middle cranial foci which could potentially represent a small meningioma.   She was initially tx empirically for meningitis.  An MRI on 12/31 revealed a PCA aneurysm, and LP results proved most c/w subarachnoid hemorrhage. She subsequently underwent primary coiling of an 91mm x 5 mm irreg bilobed PCOM region aneurysm in IR on 03/05/2014.  Immediate post-op care was directed by PCCM, w/ [facility] resuming care of the pt on 03/09/2014.    HPI/Subjective: Pt is awake/alert, but markedly confused.  She denies any complaints whatsoever.    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 w/ SAH -S/P coil ablation on 1/1 - dexamethasone 4 mg QID per Neurosurgery  Communicating hydrocephalus -care per NS - plan is for continued observation through the weekend w/ ventriculoperitoneal shunting early next week if her cognitive state does not improve  UTI  -UA  12/29 c/w UTI, but culture not helpful in isolating a specific pathogen - repeat UA today w/ culture - if remains + or equivocal, will tx w/ 5 day course of abx in setting of encephalopathy   HLD  -continue simvastatin 20 mg daily  Code Status: FULL Family Communication: no family present at time of exam  Disposition Plan: eventual CIR admit - awaiting determination of possible need for VP shunt   Consultants: Neuro Neurosurgery PCCM  Procedures: LP 12/31 > xanthochromic, 7.8K RBC, 158 WBC (41 neutrophils). Glucose 36. Total protein 84. Gram stain negative CSF HSV 12/31 >  Brain MRI 12/31 > focal diffusion signal in the right sylvian fissure, right posterior indicating arterial elongated aneurysm, dilation of the lateral ventricles but no evidence of hydrocephalus 1/1 coiling of the right posterior communicating artery aneurysm; grade 3 subarachnoid hemorrhage likely secondary to subclinical aneurysmal hemorrhage   Antibiotics: Ceftriaxone 12/29 > 12/30 Vancomycin 12/30  Acyclovir 12/30 > 12/31 Ampicillin 12/30 > 12/31  DVT prophylaxis: SCD  Objective: Blood pressure 129/79, pulse 59, temperature 98.1 F (36.7 C), temperature source Oral, resp. rate 16, height 5\' 3"  (1.6 m), weight 54.6 kg (120 lb 5.9 oz), SpO2 98 %.  Intake/Output Summary (Last 24 hours) at 03/10/14 1134 Last data filed at 03/10/14 0954  Gross per 24 hour  Intake   3077 ml  Output   2700 ml  Net    377 ml   Exam: General: alert but confused - no acute respiratory distress Lungs: Clear to auscultation bilaterally without wheezes or crackles Cardiovascular: Regular rate and rhythm without murmur gallop or rub  normal S1 and S2 Abdomen: Nontender, nondistended, soft, bowel sounds positive, no rebound, no ascites, no appreciable mass Extremities: No significant cyanosis, clubbing, or edema bilateral lower extremities  Data Reviewed: Basic Metabolic Panel:  Recent Labs Lab 03/06/14 0138 03/07/14 0225  03/08/14 0253 03/09/14 0220 03/10/14 0324  NA 138 138 141 138 140  K 4.0 3.3* 3.8 4.0 4.1  CL 111 112 112 107 107  CO2 23 23 24 24 28   GLUCOSE 137* 144* 127* 128* 129*  BUN 13 14 10  <5* 7  CREATININE 0.59 0.49* 0.57 0.47* 0.54  CALCIUM 6.7* 7.0* 7.5* 7.5* 8.0*  MG  --  2.4 2.4 2.0 2.0  PHOS  --  1.9* 2.0* 2.4 2.6   Liver Function Tests:  Recent Labs Lab 03/05/14 0500 03/10/14 0324  AST 16 27  ALT 11 46*  ALKPHOS 41 67  BILITOT 0.6 0.4  PROT 5.3* 5.0*  ALBUMIN 2.8* 2.9*   CBC:  Recent Labs Lab 03/07/14 0225 03/08/14 0253 03/09/14 0220 03/09/14 1500 03/10/14 0324  WBC 11.2* 11.3* 10.2 10.2 10.3  NEUTROABS 10.1* 10.0* 9.0* 8.9* 9.2*  HGB 9.7* 10.4* 10.7* 10.8* 11.5*  HCT 29.6* 31.5* 31.9* 32.9* 33.8*  MCV 95.5 94.6 93.8 95.1 96.0  PLT 237 244 237 258 246   CBG:  Recent Labs Lab 03/05/14 1955 03/05/14 2329 03/06/14 0322  GLUCAP 129* 126* 124*    Recent Results (from the past 240 hour(s))  Urine culture     Status: None   Collection Time: 03/02/14 11:40 AM  Result Value Ref Range Status   Specimen Description URINE, CLEAN CATCH  Final   Special Requests ADDED [id number]  Final   Colony Count   Final    >=100,000 COLONIES/ML Performed at [facility]    Culture   Final    Multiple bacterial morphotypes present, none predominant. Suggest appropriate recollection if clinically indicated. Performed at [facility]    Report Status 03/04/2014 FINAL  Final  Culture, blood (routine x 2)     Status: None   Collection Time: 03/03/14  1:51 AM  Result Value Ref Range Status   Specimen Description BLOOD LEFT HAND  Final   Special Requests BOTTLES DRAWN AEROBIC ONLY [place]  Final   Culture   Final    NO GROWTH 5 DAYS Performed at [facility]    Report Status 03/09/2014 FINAL  Final  Culture, blood (routine x 2)     Status: None   Collection Time: 03/03/14  8:22 AM  Result Value Ref Range Status   Specimen Description BLOOD LEFT  ARM  Final   Special Requests BOTTLES DRAWN AEROBIC AND ANAEROBIC 10CC  Final   Culture   Final    NO GROWTH 5 DAYS Performed at [facility]    Report Status 03/09/2014 FINAL  Final  Gram stain     Status: None   Collection Time: 03/04/14  3:00 PM  Result Value Ref Range Status   Specimen Description CSF  Final   Special Requests CSF FLUID NO 2 3.5CC  Final   Gram Stain   Final    CYTOSPIN PREP WBC PRESENT,BOTH PMN AND MONONUCLEAR NO ORGANISMS SEEN    Report Status 03/04/2014 FINAL  Final  CSF culture     Status: None   Collection Time: 03/04/14  3:00 PM  Result Value Ref Range Status   Specimen Description CSF  Final   Special Requests NONE  Final   Gram Stain  Final    WBC PRESENT,BOTH PMN AND MONONUCLEAR NO ORGANISMS SEEN CYTOSPIN Performed at [facility] Performed at [place]   Final    NO GROWTH 3 DAYS Performed at [facility]    Report Status 03/08/2014 FINAL  Final  MRSA PCR Screening     Status: None   Collection Time: 03/04/14  7:32 PM  Result Value Ref Range Status   MRSA by PCR NEGATIVE NEGATIVE Final    Comment:        The GeneXpert MRSA Assay (FDA approved for NASAL specimens only), is one component of a comprehensive MRSA colonization surveillance program. It is not intended to diagnose MRSA infection nor to guide or monitor treatment for MRSA infections.   Clostridium Difficile by PCR     Status: None   Collection Time: 03/07/14  5:43 AM  Result Value Ref Range Status   C difficile by pcr NEGATIVE NEGATIVE Final     Studies:  Recent x-ray studies have been reviewed in detail by the Attending Physician  Scheduled Meds:  Scheduled Meds: . antiseptic oral rinse  7 mL Mouth Rinse BID  . dexamethasone  4 mg Intravenous 4 times per day  . famotidine  20 mg Oral Daily  . feeding supplement (ENSURE COMPLETE)  237 mL Oral TID WC  . NiMODipine  60 mg Oral Q4H  . simvastatin  20 mg Oral q1800     Time spent on care of this patient: 35 mins  Cherene Altes, MD [facility] For Consults/Admissions - Flow Manager - [phone] Office  [phone]  Contact MD directly via text page:      [email or web address]      password [facility]  03/10/2014, 11:34 AM   LOS: 8 days

## 2014-03-10 NOTE — Progress Notes (Signed)
Referring Physician(s): PCCM  Subjective:  SAH; PCOM aneurysm coiling 03/05/14 Pt still confused Pleasant Not able to answer date; time; place correctly Resting well  Allergies: Review of patient's allergies indicates no known allergies.  Medications: Prior to Admission medications   Medication Sig Start Date End Date Taking? Authorizing Provider  aspirin EC 81 MG tablet Take 81 mg by mouth daily.   Yes Historical Provider, MD  cholecalciferol (VITAMIN D) 1000 UNITS tablet Take 1,000 Units by mouth daily.   Yes Historical Provider, MD  ibuprofen (ADVIL,MOTRIN) 200 MG tablet Take 200 mg by mouth every 6 (six) hours as needed.   Yes Historical Provider, MD  Minoxidil (ROGAINE EX) Apply 1 application topically daily.   Yes Historical Provider, MD  naproxen sodium (ANAPROX) 220 MG tablet Take 220 mg by mouth 2 (two) times daily with a meal.   Yes Historical Provider, MD  Omega-3 Fatty Acids (FISH OIL PO) Take 1 tablet by mouth daily.   Yes Historical Provider, MD  RENOVA 0.02 % CREA Apply 1 application topically daily. 01/12/14  Yes Historical Provider, MD  simvastatin (ZOCOR) 20 MG tablet Take 1 tablet by mouth daily. 02/07/14  Yes Historical Provider, MD  vitamin E 100 UNIT capsule Take 100 Units by mouth daily.   Yes Historical Provider, MD  ondansetron (ZOFRAN ODT) 4 MG disintegrating tablet Take 1 tablet (4 mg total) by mouth every 8 (eight) hours as needed for nausea or vomiting. 10/11/13   Sherian Maroon, MD    Review of Systems  Vital Signs: BP 121/64 mmHg  Pulse 59  Temp(Src) 98.7 F (37.1 C) (Oral)  Resp 21  Ht 5\' 3"  (1.6 m)  Wt 54.6 kg (120 lb 5.9 oz)  BMI 21.33 kg/m2  SpO2 96%  Physical Exam  Musculoskeletal: Normal range of motion.  Neurological: She is alert.  Skin: Skin is warm and dry.  Psychiatric:  Unable to answer questions appropriately Pleasant In NAD   Nursing note and vitals reviewed.   Imaging: Ct Head Wo Contrast  03/10/2014   CLINICAL DATA:   Followup coil. Subarachnoid hemorrhage, status post coil embolization March 05, 2014, PCOM aneurysm. 4 mm LEFT ICA aneurysm.  EXAM: CT HEAD WITHOUT CONTRAST  TECHNIQUE: Contiguous axial images were obtained from the base of the skull through the vertex without intravenous contrast.  COMPARISON:  CT of the head March 06, 2014  FINDINGS: Coil embolic material at the level of the RIGHT carotid terminus/P comm results in streak artifact. Small amount of residual subarachnoid blood in the sylvian fissures. No intraparenchymal hemorrhage, mass effect or midline shift. The anterior recess of the third ventricle it is 13 mm, previously approximately 10 mm. Frontal horn of the lateral recess diameter is 13 mm, unchanged. No acute large vascular territory infarct.  Basal cisterns are patent. Mild calcific atherosclerosis of the carotid siphons. No paranasal sinus air-fluid levels. Visualized mastoid air cells are well aerated. Severe temporomandibular osteoarthrosis.  IMPRESSION: Status post coil embolization of RIGHT PCOM aneurysm, no acute large vascular territory infarct. Small amount of residual subarachnoid blood within the sylvian fissures.  Moderate to severe ventriculomegaly, slightly increased from prior examination concerning for early hydrocephalus. Moderate white matter changes suggest chronic small vessel ischemic disease without CT findings of transependymal flow cerebral spinal fluid.   Electronically Signed   By: Elon Alas   On: 03/10/2014 05:13    Labs:  CBC:  Recent Labs  03/08/14 0253 03/09/14 0220 03/09/14 1500 03/10/14 0324  WBC 11.3* 10.2  10.2 10.3  HGB 10.4* 10.7* 10.8* 11.5*  HCT 31.5* 31.9* 32.9* 33.8*  PLT 244 237 258 246    COAGS:  Recent Labs  03/02/14 1048  INR 1.01    BMP:  Recent Labs  03/07/14 0225 03/08/14 0253 03/09/14 0220 03/10/14 0324  NA 138 141 138 140  K 3.3* 3.8 4.0 4.1  CL 112 112 107 107  CO2 23 24 24 28   GLUCOSE 144* 127* 128* 129*    BUN 14 10 <5* 7  CALCIUM 7.0* 7.5* 7.5* 8.0*  CREATININE 0.49* 0.57 0.47* 0.54  GFRNONAA >90 87* >90 89*  GFRAA >90 >90 >90 >90    LIVER FUNCTION TESTS:  Recent Labs  03/02/14 1048 03/05/14 0500 03/10/14 0324  BILITOT 0.7 0.6 0.4  AST 21 16 27   ALT 12 11 46*  ALKPHOS 51 41 67  PROT 6.8 5.3* 5.0*  ALBUMIN 4.0 2.8* 2.9*    Assessment and Plan:  PCOM aneurysm coiling 1/1 Still quite confused Plan per NS and PCCM    I spent a total of 15 minutes face to face in clinical consultation/evaluation, greater than 50% of which was counseling/coordinating care for PCOM aneurysm coiling 1/1  Signed: TURPIN,PAMELA A 03/10/2014, 3:16 PM

## 2014-03-10 NOTE — Progress Notes (Signed)
Physical Therapy Treatment Patient Details Name: Sharon [name] MRN: [id number] DOB: Dec 02, 1934 Today's Date: 03/10/2014    History of Present Illness 80 year old female admitted 03/02/14 due to AMS, aneurysmal SAH. PMH significant for DLD. CXR negative, CT indicated nothing acute. 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    PT Comments    Pt with decreased cognition today impacting pt's mobility and safety.  Pt needs very frequent re-directing to task about every 10 seconds.  Pt only oriented to self today and only able to state her birth month.  Pt performed hand hygiene at the sink and needed Max cueing for locating soap and maintaining attention to perform task.  Pt continue to need MinA with mobility for balance mostly.  Will continue to follow along.    Follow Up Recommendations  CIR     Equipment Recommendations  Other (comment) (TBD)    Recommendations for Other Services       Precautions / Restrictions Precautions Precautions: Fall Restrictions Weight Bearing Restrictions: No    Mobility  Bed Mobility Overal bed mobility: Needs Assistance Bed Mobility: Supine to Sit;Sit to Supine     Supine to sit: Supervision Sit to supine: Supervision   General bed mobility comments: No physical A needed, but multiple cues for attending to task and for safety.    Transfers Overall transfer level: Needs assistance Equipment used: 1 person hand held assist Transfers: Sit to/from Stand Sit to Stand: Min assist         General transfer comment: cues for attending to task and only MinA for balance.    Ambulation/Gait Ambulation/Gait assistance: Min assist Ambulation Distance (Feet): 6 Feet (x2) Assistive device: 1 person hand held assist Gait Pattern/deviations: Step-through  pattern;Decreased stride length;Narrow base of support     General Gait Details: pt unsteady and reaches out for furniture to hold.  pt very easily distracted and needed frequent cueing for attending to task.     Stairs            Wheelchair Mobility    Modified Rankin (Stroke Patients Only) Modified Rankin (Stroke Patients Only) Pre-Morbid Rankin Score: No symptoms Modified Rankin: Moderately severe disability     Balance Overall balance assessment: Needs assistance Sitting-balance support: No upper extremity supported;Feet supported Sitting balance-Leahy Scale: Fair Sitting balance - Comments: Balance limited more today 2/2 increased cognitive deficits.     Standing balance support: No upper extremity supported;During functional activity Standing balance-Leahy Scale: Poor                      Cognition Arousal/Alertness: Awake/alert Behavior During Therapy: Impulsive Overall Cognitive Status: Impaired/Different from baseline Area of Impairment: Orientation;Attention;Memory;Following commands;Safety/judgement;Awareness;Problem solving Orientation Level: Disoriented to;Place;Time;Situation Current Attention Level: Focused Memory: Decreased short-term memory Following Commands: Follows one step commands inconsistently Safety/Judgement: Decreased awareness of safety;Decreased awareness of deficits Awareness: Intellectual Problem Solving: Slow processing;Decreased initiation;Difficulty sequencing;Requires verbal cues;Requires tactile cues General Comments: pt with very short attention today and needs frequent re-directing to task about every 10 seconds.  pt only oriented to self and only able to state her month month, but did give her correct age.      Exercises      General Comments        Pertinent Vitals/Pain Pain Assessment: No/denies pain    Home Living  Available Help at Discharge: Family;Friend(s) Type of Home: House              Prior  Function            PT Goals (current goals can now be found in the care plan section) Acute Rehab PT Goals Patient Stated Goal: Unable to state today.   PT Goal Formulation: With patient Time For Goal Achievement: 03/21/14 Potential to Achieve Goals: Good Progress towards PT goals: Not progressing toward goals - comment (Decreased cognition.  )    Frequency  Min 3X/week    PT Plan Current plan remains appropriate    Co-evaluation             End of Session Equipment Utilized During Treatment: Gait belt Activity Tolerance: Patient tolerated treatment well Patient left: in bed;with call bell/phone within reach;with nursing/sitter in room     Time: 5615-3794 PT Time Calculation (min) (ACUTE ONLY): 19 min  Charges:  $Therapeutic Activity: 8-22 mins                    G CodesCatarina Hartshorn, [place] 03/10/2014, 11:55 AM

## 2014-03-10 NOTE — Progress Notes (Signed)
VASCULAR LAB PRELIMINARY  PRELIMINARY  PRELIMINARY  PRELIMINARY  Transcranial Doppler  Date POD PCO2 HCT BP  MCA ACA PCA OPHT SIPH VERT Basilar  03-08-14 SB     Right  Left   66  58   --  -54   --  -67   17  21   39  47   -19  -41     -47    03-10-14 SB     Right  Left   44  50    -36  -49   --  20   12  27    35  82   -27  -24     -42         Right  Left                                             Right  Left                                             Right  Left                                            Right  Left                                            Right  Left                                        MCA = Middle Cerebral Artery      OPHT = Opthalmic Artery     BASILAR = Basilar Artery   ACA = Anterior Cerebral Artery     SIPH = Carotid Siphon PCA = Posterior Cerebral Artery   VERT = Verterbral Artery                   Normal MCA = 62+\-12 ACA = 50+\-12 PCA = 42+\-23         Brayden Brodhead, RVT 03/10/2014, 12:31 PM

## 2014-03-10 NOTE — Evaluation (Signed)
Speech Language Pathology Evaluation Patient Details Name: Sharon [name] MRN: [id number] DOB: 1934-03-25 Today's Date: 03/10/2014 Time: 6761-9509 SLP Time Calculation (min) (ACUTE ONLY): 13 min  Problem List:  Patient Active Problem List   Diagnosis Date Noted  . Altered mental status   . Subarachnoid hemorrhage 03/06/2014  . Acute respiratory failure   . SAH (subarachnoid hemorrhage)   . Aneurysm   . Meningitis   . Acute encephalopathy 03/02/2014  . Headache 03/02/2014  . UTI (lower urinary tract infection) 03/02/2014  . Dyslipidemia 03/02/2014   Past Medical History:  Past Medical History  Diagnosis Date  . Dyslipidemia   . Cystitis 03/02/2014    acute   . UTI (urinary tract infection) 03/02/2014  . Arthritis     osteo   Past Surgical History:  Past Surgical History  Procedure Laterality Date  . Abdominal hysterectomy    . Radiology with anesthesia N/A 03/04/2014    Procedure: RADIOLOGY WITH ANESTHESIA;  Surgeon: Medication Radiologist, MD;  Location: [facility];  Service: Radiology;  Laterality: N/A;  . Radiology with anesthesia N/A 03/04/2014    Procedure: RADIOLOGY WITH ANESTHESIA;  Surgeon: Rob Hickman, MD;  Location: [facility];  Service: Radiology;  Laterality: N/A;   HPI:  80 year old female admitted 02/20/14 due to AMS, aneurysmal SAH. PMH significant for DLD. CXR negative, CT indicated nothing acute. MRI revealed Focal extension from the right posterior communicating artery compatible with any elongated aneurysm, measuring up to 11 mm.   Assessment / Plan / Recommendation Clinical Impression  Pt known to SLP who appears with increased cognitive deficits from initial swallow assessment in the areas of orientation (temporat/spatial), attention, awareness, problem solving, pragmatics and working memory. Pt would benefit from continued therapy for promotion and return to cognitive independence. RN reported possible shunt placement if confusion continues.       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)   Cognition  Overall Cognitive Status: Impaired/Different from baseline Arousal/Alertness: Awake/alert Orientation Level: Oriented to person;Oriented to situation;Disoriented to place;Disoriented to time Attention: Sustained Sustained Attention: Impaired Sustained Attention Impairment: Verbal basic;Functional basic Memory: Impaired Memory Impairment: Decreased recall of new information;Decreased short term memory;Retrieval deficit Decreased Short Term Memory: Verbal basic Awareness: Impaired Awareness Impairment: Intellectual impairment;Emergent impairment;Anticipatory impairment Problem Solving: Impaired Problem Solving Impairment: Verbal basic;Functional basic Executive Function: Self Monitoring;Self Correcting;Decision Making;Reasoning;Organizing Safety/Judgment: Impaired    Comprehension  Auditory Comprehension Overall Auditory Comprehension: Impaired Conversation:  (higher level information) Interfering Components: Attention;Working Curator: Not tested Reading Comprehension Reading Status:  (TBA)    Expression Expression Primary Mode of Expression: Verbal Verbal Expression Overall Verbal Expression: Appears within functional limits for tasks assessed Initiation: No impairment Pragmatics: Impairment Impairments: Topic maintenance;Topic appropriateness;Interpretation of nonverbal communication;Turn Taking Written  Expression Dominant Hand: Right Written Expression:  (TBA)   Oral / Motor Oral Motor/Sensory Function Overall Oral Motor/Sensory Function: Appears within functional limits for tasks assessed Motor Speech Overall Motor Speech: Appears within functional limits for tasks assessed Intelligibility: Intelligible   GO     Houston Siren 03/10/2014, 9:53 AM  Orbie Pyo [place] [phone]

## 2014-03-10 NOTE — Progress Notes (Signed)
The patient remains significantly confused. She denies current headache.  She is afebrile. Vitals are stable aside from some mild bradycardia. Urine output good. Lab studies without major abnormality. Patient is awake and alert. She is confused to time and situation. Her speech is fluent. Judgment and insight are lacking. Cranial nerve function otherwise intact. Motor and sensory function of the extremities normal.  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  Status post subarachnoid hemorrhage with significant cognitive issues likely secondary to cortical irritation as well as communicating hydrocephalus. I recommend continued observation through the weekend. If the patient does not make significant clinical improvement in her cognitive deficits that I would recommend ventriculoperitoneal shunting early next week.

## 2014-03-11 ENCOUNTER — Inpatient Hospital Stay (HOSPITAL_COMMUNITY): Payer: Medicare Other

## 2014-03-11 DIAGNOSIS — E785 Hyperlipidemia, unspecified: Secondary | ICD-10-CM | POA: Insufficient documentation

## 2014-03-11 DIAGNOSIS — I671 Cerebral aneurysm, nonruptured: Secondary | ICD-10-CM

## 2014-03-11 DIAGNOSIS — R410 Disorientation, unspecified: Secondary | ICD-10-CM | POA: Insufficient documentation

## 2014-03-11 DIAGNOSIS — G910 Communicating hydrocephalus: Secondary | ICD-10-CM

## 2014-03-11 LAB — VITAMIN B12: VITAMIN B 12: 662 pg/mL (ref 211–911)

## 2014-03-11 LAB — MAGNESIUM: MAGNESIUM: 2.1 mg/dL (ref 1.5–2.5)

## 2014-03-11 LAB — COMPREHENSIVE METABOLIC PANEL
ALT: 42 U/L — ABNORMAL HIGH (ref 0–35)
AST: 25 U/L (ref 0–37)
Albumin: 3.2 g/dL — ABNORMAL LOW (ref 3.5–5.2)
Alkaline Phosphatase: 64 U/L (ref 39–117)
Anion gap: 7 (ref 5–15)
BUN: 11 mg/dL (ref 6–23)
CO2: 25 mmol/L (ref 19–32)
CREATININE: 0.51 mg/dL (ref 0.50–1.10)
Calcium: 8.2 mg/dL — ABNORMAL LOW (ref 8.4–10.5)
Chloride: 104 mEq/L (ref 96–112)
GFR calc non Af Amer: >90 mL/min (ref >90)
GLUCOSE: 81 mg/dL (ref 70–99)
Potassium: 4.3 mmol/L (ref 3.5–5.1)
SODIUM: 136 mmol/L (ref 135–145)
Total Bilirubin: 0.6 mg/dL (ref 0.3–1.2)
Total Protein: 5.7 g/dL — ABNORMAL LOW (ref 6.0–8.3)

## 2014-03-11 LAB — CBC WITH DIFFERENTIAL/PLATELET
BASOS ABS: 0 10*3/uL (ref 0.0–0.1)
Basophils Relative: 0 % (ref 0–1)
Eosinophils Absolute: 0 10*3/uL (ref 0.0–0.7)
Eosinophils Relative: 0 % (ref 0–5)
HEMATOCRIT: 37.2 % (ref 36.0–46.0)
Hemoglobin: 12.6 g/dL (ref 12.0–15.0)
LYMPHS PCT: 14 % (ref 12–46)
Lymphs Abs: 2 10*3/uL (ref 0.7–4.0)
MCH: 32.5 pg (ref 26.0–34.0)
MCHC: 33.9 g/dL (ref 30.0–36.0)
MCV: 95.9 fL (ref 78.0–100.0)
MONO ABS: 1.2 10*3/uL — AB (ref 0.1–1.0)
MONOS PCT: 9 % (ref 3–12)
NEUTROS ABS: 10.9 10*3/uL — AB (ref 1.7–7.7)
Neutrophils Relative %: 77 % (ref 43–77)
Platelets: 290 10*3/uL (ref 150–400)
RBC: 3.88 MIL/uL (ref 3.87–5.11)
RDW: 13.4 % (ref 11.5–15.5)
WBC: 14.2 10*3/uL — AB (ref 4.0–10.5)

## 2014-03-11 LAB — RPR

## 2014-03-11 LAB — TSH: TSH: 1.683 u[IU]/mL (ref 0.350–4.500)

## 2014-03-11 LAB — FERRITIN: FERRITIN: 162 ng/mL (ref 10–291)

## 2014-03-11 LAB — IRON AND TIBC
Iron: 55 ug/dL (ref 42–145)
SATURATION RATIOS: 21 % (ref 20–55)
TIBC: 264 ug/dL (ref 250–470)
UIBC: 209 ug/dL (ref 125–400)

## 2014-03-11 LAB — PHOSPHORUS: PHOSPHORUS: 3.2 mg/dL (ref 2.3–4.6)

## 2014-03-11 LAB — FOLATE: FOLATE: 18 ng/mL

## 2014-03-11 MED ORDER — HALOPERIDOL LACTATE 5 MG/ML IJ SOLN
3.0000 mg | Freq: Four times a day (QID) | INTRAMUSCULAR | Status: DC | PRN
  Administered 2014-03-15 – 2014-03-16 (×3): 3 mg via INTRAVENOUS
  Filled 2014-03-11 (×3): qty 1

## 2014-03-11 MED ORDER — LORAZEPAM 2 MG/ML IJ SOLN
INTRAMUSCULAR | Status: AC
  Filled 2014-03-11: qty 1

## 2014-03-11 MED ORDER — LORAZEPAM 2 MG/ML IJ SOLN: 1.0000 mg | Freq: Once | INTRAMUSCULAR | Status: DC

## 2014-03-11 MED ORDER — LORAZEPAM 2 MG/ML IJ SOLN
1.0000 mg | Freq: Once | INTRAMUSCULAR | Status: AC
  Administered 2014-03-11: 1 mg via INTRAMUSCULAR

## 2014-03-11 MED ORDER — HALOPERIDOL LACTATE 5 MG/ML IJ SOLN
INTRAMUSCULAR | Status: AC
  Filled 2014-03-11: qty 1

## 2014-03-11 MED ORDER — SODIUM CHLORIDE 0.9 % IJ SOLN: 10.0000 mL | INTRAMUSCULAR | Status: DC | PRN

## 2014-03-11 MED ORDER — DEXAMETHASONE SODIUM PHOSPHATE 4 MG/ML IJ SOLN
2.0000 mg | Freq: Two times a day (BID) | INTRAMUSCULAR | Status: DC
  Administered 2014-03-11 – 2014-03-12 (×3): 2 mg via INTRAVENOUS
  Filled 2014-03-11 (×4): qty 0.5

## 2014-03-11 MED ORDER — SODIUM CHLORIDE 0.9 % IJ SOLN
10.0000 mL | Freq: Two times a day (BID) | INTRAMUSCULAR | Status: DC
  Administered 2014-03-11: 10 mL
  Administered 2014-03-11: 20 mL
  Administered 2014-03-12 (×2): 10 mL
  Administered 2014-03-13: 20 mL
  Administered 2014-03-14 – 2014-03-15 (×3): 10 mL
  Administered 2014-03-15: 20 mL
  Administered 2014-03-16 – 2014-03-18 (×5): 10 mL

## 2014-03-11 MED ORDER — HALOPERIDOL LACTATE 5 MG/ML IJ SOLN
3.0000 mg | Freq: Once | INTRAMUSCULAR | Status: AC | PRN
  Administered 2014-03-11: 3 mg via INTRAMUSCULAR

## 2014-03-11 MED ORDER — LORAZEPAM 2 MG/ML IJ SOLN
1.0000 mg | Freq: Once | INTRAMUSCULAR | Status: AC
  Administered 2014-03-11: 1 mg via INTRAVENOUS
  Filled 2014-03-11: qty 1

## 2014-03-11 NOTE — Progress Notes (Signed)
Pt very agitated and will not take medications.  Made MD aware.  Order to place panda.  Panda placed with no complications.  Pt restrained.  Waiting for xray confirmation.

## 2014-03-11 NOTE — Progress Notes (Signed)
Spoke with Dr. Annette Stable about pt being more combative and confused today.  Spoke with daughter earlier today who requested to speak with Dr. Annette Stable.  Notified Dr. Annette Stable of this and gave him the son and daughters' numbers.  Will continue to monitor pt.

## 2014-03-11 NOTE — Progress Notes (Signed)
Pt beginning to get very agitated, wanting to leave the hospital, she is completely oriented yet confused. Pt getting very combative progressively. MD notified around 0100, and  ativan order was received and given to patient. The patient slept for approximately an hour and a half and woke up very combative. Another order for ativan was obtained around 0430 this time for IM d/t pt pulling out IV. At this time the pt is still very confused and combative.

## 2014-03-11 NOTE — Progress Notes (Signed)
SLP Cancellation Note  Patient Details Name: Sharon [name] MRN: [id number] DOB: 07/04/1934   Cancelled treatment:        Pt combative, agitated last night, received Ativan and currently sleeping soundly. Defer treatment at this time. Discussion of shunt placement if confusion persists. St will follow.   Houston Siren 03/11/2014, 9:20 AM  Orbie Pyo [place] [phone]

## 2014-03-11 NOTE — Progress Notes (Signed)
Rehab admissions - Noted more confused and possible need for VP shunt.  Not medically ready for acute inpatient rehab admission.  I will follow up on Monday for progress and plans.  Call me for questions.  #[phone]

## 2014-03-11 NOTE — Progress Notes (Signed)
Patient more confused and agitated overnight. She has been sedated this morning. No new deficits otherwise.  Status post subarachnoid hemorrhage with subsequent coiling. Patient with communicating hydrocephalus complicating recovery. Continue current management. Possible VP shunt Monday.

## 2014-03-11 NOTE — Progress Notes (Addendum)
PULMONARY / CRITICAL CARE MEDICINE   Name: Sharon [name] MRN: [id number] DOB: 24-Sep-1934    ADMISSION DATE:  03/02/2014 CONSULTATION DATE:  03/05/14  REFERRING MD :  Dr. Garen Grams  REASON FOR CONSULTATION: Ventilator-dependent respiratory failure  INITIAL PRESENTATION:  80 year old woman, active smoker with a history of arthritis. She began to have headache and possibly some associated weakness dating back to the end of November. Since that time she had experienced some increased somnolence and off-and-on headache. Then 12/28 she was noted to be disoriented. She was admitted 12/29 for neurological evaluation. Lumbar puncture was consistent with subarachnoid bleeding. 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 She underwent coiling of the aneurysm on 03/05/14. Post procedure she was admitted to the ICU on mechanical ventilation.   STUDIES:  Head CT 12/29 >> indeterminate punctate calcification in the medial aspect of the right middle cranial fossa, possibly small meningioma LP 12/31 > xanthochromic, 7.8K RBC, 158 WBC (41 neutrophils). Glucose 36. Total protein 84. Gram stain negative CSF HSV 12/31 >>  Brain MRI 12/31 >> focal diffusion signal in the right sylvian fissure, right posterior indicating arterial elongated aneurysm, dilation of the lateral ventricles but no evidence of hydrocephalus   03/05/14 >> coiling of the right posterior communicating artery aneurysm. Also noted on angiography was a left internal carotid artery aneurysm   03/06/14 - extubated   03/07/14: Improved mental status. Sitting in chair. Talking . Trying to work with PT ambulating. But pleasantly confused   SUBJECTIVE/OVERNIGHT/INTERVAL HX 03/11/14: Worsening encephalopathy and never got transferred out of ICU. PCCM back on as primary. Remains extubated. Intermittently agitated. Has hydrocephalus per RN  VITAL SIGNS: Temp:  [97.3 F (36.3  C)-99.3 F (37.4 C)] 99 F (37.2 C) (01/07 0738) Resp:  [12-21] 12 (01/07 0800) BP: (109-140)/(47-93) 128/56 mmHg (01/07 0800) SpO2:  [96 %-100 %] 96 % (01/07 0800) HEMODYNAMICS:   VENTILATOR SETTINGS:   INTAKE / OUTPUT:  Intake/Output Summary (Last 24 hours) at 03/11/14 1021 Last data filed at 03/11/14 0800  Gross per 24 hour  Intake   1990 ml  Output    900 ml  Net   1090 ml    PHYSICAL EXAMINATION: General: frail female on vent Neuro: Sleeping, in fetal position Sitter at bedside . RN reports persistent confusion with intermittent agitationHEENT:  Pupils pin point Cardiovascular:  regular Lungs:  No wheeze Abdomen:  Soft, non tender Musculoskeletal:  No edema Skin:  No rashes  LABS:  CBC  Recent Labs Lab 03/09/14 0220 03/09/14 1500 03/10/14 0324  WBC 10.2 10.2 10.3  HGB 10.7* 10.8* 11.5*  HCT 31.9* 32.9* 33.8*  PLT 237 258 246   Coag's No results for input(s): APTT, INR in the last 168 hours. BMET  Recent Labs Lab 03/08/14 0253 03/09/14 0220 03/10/14 0324  NA 141 138 140  K 3.8 4.0 4.1  CL 112 107 107  CO2 24 24 28   BUN 10 <5* 7  CREATININE 0.57 0.47* 0.54  GLUCOSE 127* 128* 129*   Electrolytes  Recent Labs Lab 03/08/14 0253 03/09/14 0220 03/10/14 0324  CALCIUM 7.5* 7.5* 8.0*  MG 2.4 2.0 2.0  PHOS 2.0* 2.4 2.6   Sepsis Markers No results for input(s): LATICACIDVEN, PROCALCITON, O2SATVEN in the last 168 hours. ABG  Recent Labs Lab 03/05/14 0333  PHART 7.396  PCO2ART 36.5  PO2ART 128.0*     Liver Enzymes  Recent Labs Lab 03/05/14 0500 03/10/14 0324  AST  16 27  ALT 11 46*  ALKPHOS 41 67  BILITOT 0.6 0.4  ALBUMIN 2.8* 2.9*   Cardiac Enzymes No results for input(s): TROPONINI, PROBNP in the last 168 hours.   Glucose  Recent Labs Lab 03/05/14 1955 03/05/14 2329 03/06/14 0322  GLUCAP 129* 126* 124*    Imaging Ct Head Wo Contrast  03/10/2014   CLINICAL DATA:  Followup coil. Subarachnoid hemorrhage, status post  coil embolization March 05, 2014, PCOM aneurysm. 4 mm LEFT ICA aneurysm.  EXAM: CT HEAD WITHOUT CONTRAST  TECHNIQUE: Contiguous axial images were obtained from the base of the skull through the vertex without intravenous contrast.  COMPARISON:  CT of the head March 06, 2014  FINDINGS: Coil embolic material at the level of the RIGHT carotid terminus/P comm results in streak artifact. Small amount of residual subarachnoid blood in the sylvian fissures. No intraparenchymal hemorrhage, mass effect or midline shift. The anterior recess of the third ventricle it is 13 mm, previously approximately 10 mm. Frontal horn of the lateral recess diameter is 13 mm, unchanged. No acute large vascular territory infarct.  Basal cisterns are patent. Mild calcific atherosclerosis of the carotid siphons. No paranasal sinus air-fluid levels. Visualized mastoid air cells are well aerated. Severe temporomandibular osteoarthrosis.  IMPRESSION: Status post coil embolization of RIGHT PCOM aneurysm, no acute large vascular territory infarct. Small amount of residual subarachnoid blood within the sylvian fissures.  Moderate to severe ventriculomegaly, slightly increased from prior examination concerning for early hydrocephalus. Moderate white matter changes suggest chronic small vessel ischemic disease without CT findings of transependymal flow cerebral spinal fluid.   Electronically Signed   By: Elon Alas   On: 03/10/2014 05:13     ASSESSMENT / PLAN:  PULMONARY ETT 03/05/14 >> 03/06/14 A:  Acute respiratory failure post procedure. History of tobacco use.   - s/p successful extubation 02/03/15. Current encephalopathy puts her at risk for intubation P:   Monitor clinically   CARDIOVASCULAR A:  Hx of HLD. P:  Continue zocor  RENAL A:   Nil acute P:   repleted by elink   GASTROINTESTINAL A:   Nutrition.   - nil acute but encepphalopathy   P:   Per neurosurge for food timing  H2 blockade for SUP NPO due  to confusion Place panda  HEMATOLOGIC A:   DVT prophylaxis. P:  SCD  INFECTIOUS\ Culture - negative  A:   Fever with ?menigitis >> seems unlikely. Fever likely was due to [facility] P:   Mnitor off abx  Ceftriaxone 12/30 >> 1/01 Ampicillin 12/30 >> 1/01 Acyclovir 12/30 >> 1/01 Vancomycin 12/31 >> 1/01  ENDOCRINE A:   No acute issues. P:   Monitor blood sugar on BMET  NEUROLOGIC A:   Acute encephalopathy 2nd to grade 3 SAH.PCOM aneurysm coiling 03/05/14   - improved 03/07/14 but is now at vasospasm risk phase and CT shows hydro with ongoing confusion 03/11/14 . Noted on decadron  P:   Neuro might place shunt 03/15/14 Nimotop and Zocor for vasospasm prophylaxis to continue per neurosurgery; place pand for same Continue saline at 70ml/H per nuerosurgery Ativan prn - (RN Says not helping) Haldol Prn  (ensure Qtc < 526msec) Wean off decardron - reduced 03/11/2014    No family at bedside - updated friend (danish gentleman and son) 03/15/14 and 03/06/14 - informed them of vasospasm risk - HIGH. Son and patient updated 1./3/16. Friend at bedside updated  - Inter-disciplinary family meet or Palliative Care meeting due by 03/12/14:  The patient is critically ill with multiple organ systems failure and requires high complexity decision making for assessment and support, frequent evaluation and titration of therapies, application of advanced monitoring technologies and extensive interpretation of multiple databases.   Critical Care Time devoted to patient care services described in this note is  35  Minutes. This time reflects time of care of this signee Dr Brand Males. This critical care time does not reflect procedure time, or teaching time or supervisory time of PA/NP/Med student/Med Resident etc but could involve care discussion time    Dr. Brand Males, M.D., [facility].C.P Pulmonary and Critical Care Medicine Staff Physician [place] [facility] Pager: [phone], If no answer or between  15:00h - 7:00h: call [phone]  03/11/2014 10:34 AM

## 2014-03-11 NOTE — Progress Notes (Signed)
[place] TEAM 1 - Stepdown/ICU TEAM Progress Note  Sharon [name] KZS:[id number] DOB: 08/07/1934 DOA: 03/02/2014 PCP: Osborne Casco, MD  Admit HPI / Brief Narrative: 80 y.o. WF PMHx SAH, meningitis, and dyslipidemia who presented to the ED with altered mental status. Patient apparently experienced an episode of headache associated with weakness in November. Patient was evaluated by a neighbor who is an Materials engineer who indicated that she did not have a stroke clinically. Since that time the patient apparently had intermittent headaches and was sleeping more than usual. The day of her admission she developed new disorientation and was eventually brought to the ED.   In the ED CT head was without acute intracranial process but noted indeterminate punctate calcification within the medial aspect of the right middle cranial foci which could potentially represent a small meningioma.   She was initially tx empirically for meningitis. An MRI on 12/31 revealed a PCA aneurysm, and LP results proved most c/w subarachnoid hemorrhage. She subsequently underwent primary coiling of an 37mm x 5 mm irreg bilobed posterior communicating artery (PCOM) region aneurysm in IR on 03/05/2014. Immediate post-op care was directed by PCCM, w/ [facility] resuming care of the pt on 03/09/2014.    HPI/Subjective: 1/7 A/O 0, per nursing patient received dose of Haldol in order to place PICC line. Patient currently minimally responsive to stimuli.  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 Earnie Larsson (neurosurgery) Vashti Hey (neurology)    Procedure/Significant Events: Head CT 12/29 >> indeterminate punctate calcification in the medial aspect of the right middle cranial fossa, possibly small meningioma LP 12/31 > xanthochromic, 7.8K RBC, 158 WBC (41 neutrophils). Glucose 36. Total protein 84. Gram stain negative CSF HSV 12/31 >>  Brain MRI 12/31 >> focal diffusion signal in the right sylvian fissure, right posterior indicating arterial elongated aneurysm, dilation of the lateral ventricles but no evidence of hydrocephalus 03/05/14 >> coiling of the right posterior communicating artery aneurysm. Also noted on angiography was a left internal carotid artery aneurysm 03/06/14 - extubated 03/07/14: Improved mental status. Sitting in chair. Talking . Trying to work with PT ambulating. But pleasantly confused   Culture 1/3 C. difficile by PCR negative 1/7 blood pending 1/7 urine pending    Antibiotics: NA   DVT prophylaxis: SCD   Devices    LINES / TUBES:      Continuous Infusions: . sodium chloride 75 mL/hr at 03/11/14 1400    Objective: VITAL SIGNS: Temp: 99 F (37.2 C) (01/07 0738) Temp Source: Oral (01/07 0738) BP: 154/53 mmHg (01/07 1000) SPO2; FIO2:   Intake/Output Summary (Last 24 hours) at 03/11/14 1428 Last data filed  at 03/11/14 0800  Gross per 24 hour  Intake   1300 ml  Output    900 ml  Net    400 ml     Exam: General: A/O 0, patient responds minimally to painful stimuli (recently  received sedating medication for PICC line placement), No acute respiratory distress Lungs: Clear to auscultation bilaterally without wheezes or crackles Cardiovascular: Regular rate and rhythm without murmur gallop or rub normal S1 and S2 Abdomen: Nontender, nondistended, soft, bowel sounds positive, no rebound, no ascites, no appreciable mass Extremities: No significant cyanosis, clubbing, or edema bilateral lower extremities  Data Reviewed: Basic Metabolic Panel:  Recent Labs Lab 03/06/14 0138 03/07/14 0225 03/08/14 0253 03/09/14 0220 03/10/14 0324  NA 138 138 141 138 140  K 4.0 3.3* 3.8 4.0 4.1  CL 111 112 112 107 107  CO2 23 23 24 24 28   GLUCOSE 137* 144* 127* 128* 129*  BUN 13 14 10  <5* 7  CREATININE 0.59 0.49* 0.57 0.47* 0.54  CALCIUM 6.7* 7.0* 7.5* 7.5* 8.0*  MG  --  2.4 2.4 2.0 2.0  PHOS  --  1.9* 2.0* 2.4 2.6   Liver Function Tests:  Recent Labs Lab 03/05/14 0500 03/10/14 0324  AST 16 27  ALT 11 46*  ALKPHOS 41 67  BILITOT 0.6 0.4  PROT 5.3* 5.0*  ALBUMIN 2.8* 2.9*   No results for input(s): LIPASE, AMYLASE in the last 168 hours.  Recent Labs Lab 03/10/14 1350  AMMONIA 18   CBC:  Recent Labs Lab 03/07/14 0225 03/08/14 0253 03/09/14 0220 03/09/14 1500 03/10/14 0324  WBC 11.2* 11.3* 10.2 10.2 10.3  NEUTROABS 10.1* 10.0* 9.0* 8.9* 9.2*  HGB 9.7* 10.4* 10.7* 10.8* 11.5*  HCT 29.6* 31.5* 31.9* 32.9* 33.8*  MCV 95.5 94.6 93.8 95.1 96.0  PLT 237 244 237 258 246   Cardiac Enzymes: No results for input(s): CKTOTAL, CKMB, CKMBINDEX, TROPONINI in the last 168 hours. BNP (last 3 results) No results for input(s): PROBNP in the last 8760 hours. CBG:  Recent Labs Lab 03/05/14 1955 03/05/14 2329 03/06/14 0322  GLUCAP 129* 126* 124*    Recent Results (from the past 240 hour(s))  Urine culture     Status: None   Collection Time: 03/02/14 11:40 AM  Result Value Ref Range Status   Specimen Description URINE, CLEAN CATCH  Final   Special Requests  ADDED [id number]  Final   Colony Count   Final    >=100,000 COLONIES/ML Performed at [facility]    Culture   Final    Multiple bacterial morphotypes present, none predominant. Suggest appropriate recollection if clinically indicated. Performed at [facility]    Report Status 03/04/2014 FINAL  Final  Culture, blood (routine x 2)     Status: None   Collection Time: 03/03/14  1:51 AM  Result Value Ref Range Status   Specimen Description BLOOD LEFT HAND  Final   Special Requests BOTTLES DRAWN AEROBIC ONLY [place]  Final   Culture   Final    NO GROWTH 5 DAYS Performed at [facility]    Report Status 03/09/2014 FINAL  Final  Culture, blood (routine x 2)     Status: None   Collection Time: 03/03/14  8:22 AM  Result Value Ref Range Status   Specimen Description BLOOD LEFT ARM  Final   Special Requests BOTTLES DRAWN AEROBIC AND ANAEROBIC 10CC  Final   Culture   Final    NO GROWTH 5 DAYS Performed at  [facility]    Report Status 03/09/2014 FINAL  Final  Gram stain     Status: None   Collection Time: 03/04/14  3:00 PM  Result Value Ref Range Status   Specimen Description CSF  Final   Special Requests CSF FLUID NO 2 3.5CC  Final   Gram Stain   Final    CYTOSPIN PREP WBC PRESENT,BOTH PMN AND MONONUCLEAR NO ORGANISMS SEEN    Report Status 03/04/2014 FINAL  Final  CSF culture     Status: None   Collection Time: 03/04/14  3:00 PM  Result Value Ref Range Status   Specimen Description CSF  Final   Special Requests NONE  Final   Gram Stain   Final    WBC PRESENT,BOTH PMN AND MONONUCLEAR NO ORGANISMS SEEN CYTOSPIN Performed at [facility] Performed at [facility]    Culture   Final    NO GROWTH 3 DAYS Performed at [facility]    Report Status 03/08/2014 FINAL  Final  MRSA PCR Screening     Status: None   Collection Time: 03/04/14  7:32 PM  Result Value Ref Range Status   MRSA by PCR NEGATIVE NEGATIVE Final     Comment:        The GeneXpert MRSA Assay (FDA approved for NASAL specimens only), is one component of a comprehensive MRSA colonization surveillance program. It is not intended to diagnose MRSA infection nor to guide or monitor treatment for MRSA infections.   Clostridium Difficile by PCR     Status: None   Collection Time: 03/07/14  5:43 AM  Result Value Ref Range Status   C difficile by pcr NEGATIVE NEGATIVE Final     Studies:  Recent x-ray studies have been reviewed in detail by the Attending Physician  Scheduled Meds:  Scheduled Meds: . antiseptic oral rinse  7 mL Mouth Rinse BID  . dexamethasone  2 mg Intravenous Q12H  . famotidine  20 mg Oral Daily  . feeding supplement (ENSURE COMPLETE)  237 mL Oral TID WC  . haloperidol lactate      . NiMODipine  60 mg Oral Q4H  . simvastatin  20 mg Oral q1800  . sodium chloride  10-40 mL Intracatheter Q12H    Time spent on care of this patient: 40 mins   Allie Bossier , MD   [facility] Office  [phone] Pager [phone]  On-Call/Text Page:      [email or web address]      password [email or web address]  If 7PM-7AM, please contact night-coverage [email or web address] Password [email or web address] 03/11/2014, 2:28 PM   LOS: 9 days

## 2014-03-11 NOTE — Progress Notes (Signed)
Peripherally Inserted Central Catheter/Midline Placement  The IV Nurse has discussed with the patient and/or persons authorized to consent for the patient, the purpose of this procedure and the potential benefits and risks involved with this procedure.  The benefits include less needle sticks, lab draws from the catheter and patient may be discharged home with the catheter.  Risks include, but not limited to, infection, bleeding, blood clot (thrombus formation), and puncture of an artery; nerve damage and irregular heat beat.  Alternatives to this procedure were also discussed.  PICC/Midline Placement Documentation  PICC / Midline Single Lumen 49/67/59 PICC Right Basilic 36 cm 1 cm (Active)  Indication for Insertion or Continuance of Line Poor Vasculature-patient has had multiple peripheral attempts or PIVs lasting less than 24 hours 03/11/2014 12:00 PM  Exposed Catheter (cm) 1 cm 03/11/2014 12:00 PM  Dressing Change Due 03/18/14 03/11/2014 12:00 PM    Telephone consent   Jule Economy Horton 03/11/2014, 12:51 PM

## 2014-03-12 LAB — GLUCOSE, CAPILLARY
Glucose-Capillary: 95 mg/dL (ref 70–99)
Glucose-Capillary: 98 mg/dL (ref 70–99)

## 2014-03-12 LAB — MAGNESIUM: Magnesium: 2.3 mg/dL (ref 1.5–2.5)

## 2014-03-12 LAB — CBC
HCT: 36.9 % (ref 36.0–46.0)
HEMOGLOBIN: 12.1 g/dL (ref 12.0–15.0)
MCH: 31.2 pg (ref 26.0–34.0)
MCHC: 32.8 g/dL (ref 30.0–36.0)
MCV: 95.1 fL (ref 78.0–100.0)
PLATELETS: 280 10*3/uL (ref 150–400)
RBC: 3.88 MIL/uL (ref 3.87–5.11)
RDW: 13.5 % (ref 11.5–15.5)
WBC: 14.7 10*3/uL — ABNORMAL HIGH (ref 4.0–10.5)

## 2014-03-12 LAB — BASIC METABOLIC PANEL
Anion gap: 6 (ref 5–15)
BUN: 11 mg/dL (ref 6–23)
CHLORIDE: 104 meq/L (ref 96–112)
CO2: 24 mmol/L (ref 19–32)
Calcium: 7.7 mg/dL — ABNORMAL LOW (ref 8.4–10.5)
Creatinine, Ser: 0.58 mg/dL (ref 0.50–1.10)
GFR calc Af Amer: >90 mL/min (ref >90)
GFR, EST NON AFRICAN AMERICAN: 87 mL/min — AB (ref >90)
Glucose, Bld: 98 mg/dL (ref 70–99)
Potassium: 4.3 mmol/L (ref 3.5–5.1)
Sodium: 134 mmol/L — ABNORMAL LOW (ref 135–145)

## 2014-03-12 LAB — PHOSPHORUS: Phosphorus: 3.7 mg/dL (ref 2.3–4.6)

## 2014-03-12 MED ORDER — JEVITY 1.2 CAL PO LIQD
1000.0000 mL | ORAL | Status: DC
  Administered 2014-03-12: 20 mL/h
  Administered 2014-03-13: 1000 mL
  Filled 2014-03-12 (×5): qty 1000

## 2014-03-12 MED ORDER — CHLORHEXIDINE GLUCONATE 0.12 % MT SOLN
15.0000 mL | Freq: Two times a day (BID) | OROMUCOSAL | Status: DC
  Administered 2014-03-12 – 2014-03-13 (×3): 15 mL via OROMUCOSAL
  Filled 2014-03-12 (×2): qty 15

## 2014-03-12 MED ORDER — CETYLPYRIDINIUM CHLORIDE 0.05 % MT LIQD
7.0000 mL | Freq: Two times a day (BID) | OROMUCOSAL | Status: DC
  Administered 2014-03-12 – 2014-03-14 (×5): 7 mL via OROMUCOSAL

## 2014-03-12 NOTE — Progress Notes (Addendum)
VASCULAR LAB PRELIMINARY  PRELIMINARY  PRELIMINARY  PRELIMINARY  Transcranial Doppler  Date POD PCO2 HCT BP  MCA ACA PCA OPHT SIPH VERT Basilar  03-08-14 SB     Right  Left   66  58   --  -54   --  -67   17  21   39  47   -19  -41     -47    03-10-14 SB     Right  Left   44  50    -36  -49   --  20   12  27    35  82   -27  -24     -42    03-12-14 SB     Right  Left   47  43   -38  -33   21  22/25   17  22    32  27   -20  -25     -31     03-17-14 VS     Right  Left   50  47   -42  -45   39  26   15  21      63   -44  -30     -32          Right  Left                                            Right  Left                                            Right  Left                                        MCA = Middle Cerebral Artery      OPHT = Opthalmic Artery     BASILAR = Basilar Artery   ACA = Anterior Cerebral Artery     SIPH = Carotid Siphon PCA = Posterior Cerebral Artery   VERT = Verterbral Artery                   Normal MCA = 62+\-12 ACA = 50+\-12 PCA = 42+\-23         [name], Sharon, RVT 03/12/2014, 1:13 PM  [facility], RVS 03/17/2014, 3:00 PM

## 2014-03-12 NOTE — Progress Notes (Signed)
PULMONARY / CRITICAL CARE MEDICINE   Name: Sharon [name] MRN: [id number] DOB: [birth year]    ADMISSION DATE:  03/02/2014 CONSULTATION DATE:  03/05/14  REFERRING MD :  Dr. Garen Grams  REASON FOR CONSULTATION: Ventilator-dependent respiratory failure  INITIAL PRESENTATION:  80 year old woman, active smoker with a history of arthritis. She began to have headache and possibly some associated weakness dating back to the end of November. Since that time she had experienced some increased somnolence and off-and-on headache. Then 12/28 she was noted to be disoriented. She was admitted 12/29 for neurological evaluation. Lumbar puncture was consistent with subarachnoid bleeding. 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 She underwent coiling of the aneurysm on 03/05/14. Post procedure she was admitted to the ICU on mechanical ventilation.   STUDIES:  Head CT 12/29 >> indeterminate punctate calcification in the medial aspect of the right middle cranial fossa, possibly small meningioma LP 12/31 > xanthochromic, 7.8K RBC, 158 WBC (41 neutrophils). Glucose 36. Total protein 84. Gram stain negative CSF HSV 12/31 >>  Brain MRI 12/31 >> focal diffusion signal in the right sylvian fissure, right posterior indicating arterial elongated aneurysm, dilation of the lateral ventricles but no evidence of hydrocephalus   03/05/14 >> coiling of the right posterior communicating artery aneurysm. Also noted on angiography was a left internal carotid artery aneurysm  03/06/14 - extubated  03/07/14: Improved mental status. Sitting in chair. Talking . Trying to work with PT ambulating. But pleasantly confused  03/11/14: Worsening encephalopathy and never got transferred out of ICU. PCCM back on as primary. Remains extubated. Intermittently agitated. Has hydrocephalus per RN   SUBJECTIVE/OVERNIGHT/INTERVAL HX  03/12/2014: still in fetal position,  photophobic, but improved agitation after haldol. Confused though   VITAL SIGNS: Temp:  [97.4 F (36.3 C)-99 F (37.2 C)] 98.3 F (36.8 C) (01/08 0800) Resp:  [11-19] 16 (01/08 0835) BP: (95-193)/(38-174) 193/174 mmHg (01/08 0835) SpO2:  [95 %-100 %] 100 % (01/08 0835) Weight:  [54.9 kg (121 lb 0.5 oz)] 54.9 kg (121 lb 0.5 oz) (01/08 0400) HEMODYNAMICS:   VENTILATOR SETTINGS:   INTAKE / OUTPUT:  Intake/Output Summary (Last 24 hours) at 03/12/14 0859 Last data filed at 03/12/14 0800  Gross per 24 hour  Intake   1580 ml  Output      0 ml  Net   1580 ml    PHYSICAL EXAMINATION: General: frail female on vent Neuro: : still in fetal position, photophobic, but improved agitation after haldol. Confused though HEENT:  Pupils pin point Cardiovascular:  regular Lungs:  No wheeze Abdomen:  Soft, non tender Musculoskeletal:  No edema Skin:  No rashes  LABS: PULMONARY No results for input(s): PHART, PCO2ART, PO2ART, HCO3, TCO2, O2SAT in the last 168 hours.  Invalid input(s): PCO2, PO2  CBC  Recent Labs Lab 03/10/14 0324 03/11/14 1610 03/12/14 0500  HGB 11.5* 12.6 12.1  HCT 33.8* 37.2 36.9  WBC 10.3 14.2* 14.7*  PLT 246 290 280    COAGULATION No results for input(s): INR in the last 168 hours.  CARDIAC  No results for input(s): TROPONINI in the last 168 hours. No results for input(s): PROBNP in the last 168 hours.   CHEMISTRY  Recent Labs Lab 03/08/14 0253 03/09/14 0220 03/10/14 0324 03/11/14 1610 03/12/14 0500  NA 141 138 140 136 134*  K 3.8 4.0 4.1 4.3 4.3  CL 112 107 107 104 104  CO2 24 24 28 25 24   GLUCOSE  127* 128* 129* 81 98  BUN 10 <5* 7 11 11   CREATININE 0.57 0.47* 0.54 0.51 0.58  CALCIUM 7.5* 7.5* 8.0* 8.2* 7.7*  MG 2.4 2.0 2.0 2.1 2.3  PHOS 2.0* 2.4 2.6 3.2 3.7   Estimated Creatinine Clearance: 48.7 mL/min (by C-G formula based on Cr of 0.58).   LIVER  Recent Labs Lab 03/10/14 0324 03/11/14 1610  AST 27 25  ALT 46* 42*  ALKPHOS  67 64  BILITOT 0.4 0.6  PROT 5.0* 5.7*  ALBUMIN 2.9* 3.2*     INFECTIOUS No results for input(s): LATICACIDVEN, PROCALCITON in the last 168 hours.   ENDOCRINE CBG (last 3)  No results for input(s): GLUCAP in the last 72 hours.       IMAGING x48h Dg Abd Portable 1v  03/11/2014   CLINICAL DATA:  NG feeding tube placement  EXAM: PORTABLE ABDOMEN - 1 VIEW  COMPARISON:  03/05/2014  FINDINGS: There is a NG feeding tube with tip in distal stomach. Nonspecific nonobstructive bowel gas pattern.  IMPRESSION: NG feeding tube with tip in distal stomach.   Electronically Signed   By: Lahoma Crocker M.D.   On: 03/11/2014 11:55       ASSESSMENT / PLAN:  PULMONARY ETT 03/05/14 >> 03/06/14 A:  Acute respiratory failure post procedure. History of tobacco use.   - s/p successful extubation 02/03/15. Current encephalopathy puts her at risk for intubation P:   Monitor clinically   CARDIOVASCULAR A:  Hx of HLD. P:  Continue zocor  RENAL A:   Nil acute P:   repleted by elink   GASTROINTESTINAL A:   Nutrition.   - nil acute but encepphalopathy   P:     H2 blockade for SUP NPO due to confusion Place panda and start tue feeds  HEMATOLOGIC A:   DVT prophylaxis. P:  SCD  INFECTIOUS\ Culture - negative  A:    Fever likely was due to [facility]. Afebrile as of 03/12/14 P:   Mnitor off abx  Ceftriaxone 12/30 >> 1/01 Ampicillin 12/30 >> 1/01 Acyclovir 12/30 >> 1/01 Vancomycin 12/31 >> 1/01  ENDOCRINE A:   No acute issues. P:   Monitor blood sugar on BMET  NEUROLOGIC A:   Acute encephalopathy 2nd to grade 3 SAH.PCOM aneurysm coiling 03/05/14   - improved 03/07/14 but is now at vasospasm risk phase and CT shows hydro with ongoing confusion 03/11/14. On 03/12/14: improved after decadron decrease and haldol prn   P:   Neuro might place shunt 03/15/14 Nimotop and Zocor for vasospasm prophylaxis to continue per neurosurgery; place pand for same Continue saline at 37ml/H per  nuerosurgery Ativan prn - (RN Says not helping) Haldol Prn  (ensure Qtc < 577msec) Wean off decardron - reduced 03/11/2014    No family at bedside - updated friend (danish gentleman and son) 03/15/14 and 03/06/14 - informed them of vasospasm risk - HIGH. Son and patient updated 1./3/16. Friend at bedside updated 1.8/16  - Inter-disciplinary family meet or Palliative Care meeting due by 03/12/14:    Dr. Brand Males, M.D., [facility].C.P Pulmonary and Critical Care Medicine Staff Physician [facility] Pager: [phone], If no answer or between  15:00h - 7:00h: call [phone]  03/12/2014 8:59 AM

## 2014-03-12 NOTE — Op Note (Signed)
Date of procedure: 03/12/2014  Date of dictation: Same  Service: Neurosurgery  Preoperative diagnosis: Communicating hydrocephalus  Postoperative diagnosis: Same  Procedure Name: Diagnostic and therapeutic lumbar puncture  Surgeon:Tabor Bartram A.Keymon Mcelroy, M.D.  Asst. Surgeon: None  Anesthesia: General  Indication: 80 year old female status post subarachnoid hemorrhage with confusion and ventriculomegaly. Plan lumbar puncture for measurement of ICP and therapeutic drainage of CSF.  Operative note: Patient position in the left lateral decubitus position. Lumbar region prepped and draped. Local anesthesia infiltrated. Spinal needle introduced into the lumbar subarachnoid space. Pressure measured at 16 cm of water. CSF clear. Approximate 10 mL of CSF withdrawn. No apparent Complications. Needle withdrawn. Band-Aid placed.

## 2014-03-12 NOTE — Progress Notes (Signed)
Referring Physician(s): NS; PCCM  Subjective:  PCOM aneurysm coiling 03/05/14 Still with significant confusion Agitation Can state name aloud Does follow commands correctly  Allergies: Review of patient's allergies indicates no known allergies.  Medications: Prior to Admission medications   Medication Sig Start Date End Date Taking? Authorizing Provider  aspirin EC 81 MG tablet Take 81 mg by mouth daily.   Yes Historical Provider, MD  cholecalciferol (VITAMIN D) 1000 UNITS tablet Take 1,000 Units by mouth daily.   Yes Historical Provider, MD  ibuprofen (ADVIL,MOTRIN) 200 MG tablet Take 200 mg by mouth every 6 (six) hours as needed.   Yes Historical Provider, MD  Minoxidil (ROGAINE EX) Apply 1 application topically daily.   Yes Historical Provider, MD  naproxen sodium (ANAPROX) 220 MG tablet Take 220 mg by mouth 2 (two) times daily with a meal.   Yes Historical Provider, MD  Omega-3 Fatty Acids (FISH OIL PO) Take 1 tablet by mouth daily.   Yes Historical Provider, MD  RENOVA 0.02 % CREA Apply 1 application topically daily. 01/12/14  Yes Historical Provider, MD  simvastatin (ZOCOR) 20 MG tablet Take 1 tablet by mouth daily. 02/07/14  Yes Historical Provider, MD  vitamin E 100 UNIT capsule Take 100 Units by mouth daily.   Yes Historical Provider, MD  ondansetron (ZOFRAN ODT) 4 MG disintegrating tablet Take 1 tablet (4 mg total) by mouth every 8 (eight) hours as needed for nausea or vomiting. 10/11/13   Sherian Maroon, MD    Review of Systems  Vital Signs: BP 96/41 mmHg  Pulse 59  Temp(Src) 98.3 F (36.8 C) (Oral)  Resp 18  Ht 5\' 3"  (1.6 m)  Wt 54.9 kg (121 lb 0.5 oz)  BMI 21.45 kg/m2  SpO2 98%  Physical Exam  Musculoskeletal:  Agitated moves all 4s Wants mittens off  Neurological:  Follows commands Smile = Movement of all 4s Can state correct name Does not answer correctly to place; date Cannot identify number of fingers in front of her    Imaging: Ct Head Wo  Contrast  03/10/2014   CLINICAL DATA:  Followup coil. Subarachnoid hemorrhage, status post coil embolization March 05, 2014, PCOM aneurysm. 4 mm LEFT ICA aneurysm.  EXAM: CT HEAD WITHOUT CONTRAST  TECHNIQUE: Contiguous axial images were obtained from the base of the skull through the vertex without intravenous contrast.  COMPARISON:  CT of the head March 06, 2014  FINDINGS: Coil embolic material at the level of the RIGHT carotid terminus/P comm results in streak artifact. Small amount of residual subarachnoid blood in the sylvian fissures. No intraparenchymal hemorrhage, mass effect or midline shift. The anterior recess of the third ventricle it is 13 mm, previously approximately 10 mm. Frontal horn of the lateral recess diameter is 13 mm, unchanged. No acute large vascular territory infarct.  Basal cisterns are patent. Mild calcific atherosclerosis of the carotid siphons. No paranasal sinus air-fluid levels. Visualized mastoid air cells are well aerated. Severe temporomandibular osteoarthrosis.  IMPRESSION: Status post coil embolization of RIGHT PCOM aneurysm, no acute large vascular territory infarct. Small amount of residual subarachnoid blood within the sylvian fissures.  Moderate to severe ventriculomegaly, slightly increased from prior examination concerning for early hydrocephalus. Moderate white matter changes suggest chronic small vessel ischemic disease without CT findings of transependymal flow cerebral spinal fluid.   Electronically Signed   By: Elon Alas   On: 03/10/2014 05:13   Dg Abd Portable 1v  03/11/2014   CLINICAL DATA:  NG feeding tube placement  EXAM: PORTABLE ABDOMEN - 1 VIEW  COMPARISON:  03/05/2014  FINDINGS: There is a NG feeding tube with tip in distal stomach. Nonspecific nonobstructive bowel gas pattern.  IMPRESSION: NG feeding tube with tip in distal stomach.   Electronically Signed   By: Lahoma Crocker M.D.   On: 03/11/2014 11:55    Labs:  CBC:  Recent Labs   03/09/14 1500 03/10/14 0324 03/11/14 1610 03/12/14 0500  WBC 10.2 10.3 14.2* 14.7*  HGB 10.8* 11.5* 12.6 12.1  HCT 32.9* 33.8* 37.2 36.9  PLT 258 246 290 280    COAGS:  Recent Labs  03/02/14 1048  INR 1.01    BMP:  Recent Labs  03/09/14 0220 03/10/14 0324 03/11/14 1610 03/12/14 0500  NA 138 140 136 134*  K 4.0 4.1 4.3 4.3  CL 107 107 104 104  CO2 24 28 25 24   GLUCOSE 128* 129* 81 98  BUN <5* 7 11 11   CALCIUM 7.5* 8.0* 8.2* 7.7*  CREATININE 0.47* 0.54 0.51 0.58  GFRNONAA >90 89* >90 87*  GFRAA >90 >90 >90 >90    LIVER FUNCTION TESTS:  Recent Labs  03/02/14 1048 03/05/14 0500 03/10/14 0324 03/11/14 1610  BILITOT 0.7 0.6 0.4 0.6  AST 21 16 27 25   ALT 12 11 46* 42*  ALKPHOS 51 41 67 64  PROT 6.8 5.3* 5.0* 5.7*  ALBUMIN 4.0 2.8* 2.9* 3.2*    Assessment and Plan:  PCOM aneurysm coiling 1.1.16 Confusion worsening Agitation For LP now with Dr Trenton Gammon    I spent a total of 15 minutes face to face in clinical consultation/evaluation, greater than 50% of which was counseling/coordinating care for PCOM aneurysm coiling 03/05/14  Signed: Monia Sabal A 03/12/2014, 11:07 AM

## 2014-03-12 NOTE — Progress Notes (Signed)
PT Cancellation Note  Patient Details Name: Sharon [name] MRN: [id number] DOB: 12-Nov-1934   Cancelled Treatment:    Reason Eval/Treat Not Completed: Other (comment) Spoke with RN this am and notes that pt has been sleeping and can get agitated.  Noted plan for LP tomorrow and possible VP shunt Monday.  Will hold PT at this time and f/u as appropriate.     Jorge Retz, Thornton Papas 03/12/2014, 11:06 AM

## 2014-03-12 NOTE — Progress Notes (Signed)
Pt restraints discontinued because sitter is able to divert pt from pulling at lines and tubes. Would need to reconsider if  Sharon [name] is pulled.

## 2014-03-12 NOTE — Progress Notes (Signed)
NUTRITION FOLLOW-UP  INTERVENTION: D/C Ensure Complete   Initiate Jevity 1.2 @ 20 ml/hr via NG tube and increase by 10 ml every 4 hours to goal rate of 60 ml/hr.   Tube feeding regimen provides 1728 kcal, 80 grams of protein, and 1166 ml of H2O.    NUTRITION DIAGNOSIS: Inadequate oral intake related to confusion, decreased appetite as evidenced by meal completion of <25%; ongoing.   Goal: Pt to meet >/= 90% of their estimated nutrition needs, not met.   Monitor:  PO intake, weight trends, labs, I/O's  ASSESSMENT: Pt admitted with SAH, s/p coiling 1/1. Pt remains somewhat confused.   Pt remains lethargic. Per neurosurgery pt may need ventriculoperitoneal shunting next week.  NG tube placed and ready to start enteral nutrition. Tip in distal stomach.  Pt continues to have diarrhea.   Height: Ht Readings from Last 1 Encounters:  03/05/14 _0  (1.6 m)    Weight: Wt Readings from Last 1 Encounters:  03/12/14 121 lb 0.5 oz (54.9 kg)  Admission weight: 121 lb 12/29  Estimated Nutritional Needs: Kcal: 1650-1850 Protein: 65-75 grams Fluid: 1.65- 1.85 L/day  Skin: no issues noted  Diet Order: Diet NPO time specified Except for: Sips with Meds   Intake/Output Summary (Last 24 hours) at 03/12/14 1139 Last data filed at 03/12/14 1024  Gross per 24 hour  Intake   1645 ml  Output      0 ml  Net   1645 ml    Last BM: 1/8 - diarrhea, c.diff negative  Labs:   Recent Labs Lab 03/10/14 0324 03/11/14 1610 03/12/14 0500  NA 140 136 134*  K 4.1 4.3 4.3  CL 107 104 104  CO2 _1 BUN _2 CREATININE 0.54 0.51 0.58  CALCIUM 8.0* 8.2* 7.7*  MG 2.0 2.1 2.3  PHOS 2.6 3.2 3.7  GLUCOSE 129* 81 98    CBG (last 3)  No results for input(s): GLUCAP in the last 72 hours.  Scheduled Meds: . antiseptic oral rinse  7 mL Mouth Rinse q12n4p  . chlorhexidine  15 mL Mouth Rinse BID  . dexamethasone  2 mg Intravenous Q12H  . famotidine  20 mg Oral Daily  . NiMODipine   60 mg Oral Q4H  . simvastatin  20 mg Oral q1800  . sodium chloride  10-40 mL Intracatheter Q12H    Continuous Infusions: . sodium chloride 75 mL/hr at 03/12/14 0900   [place], LDN, CNSC [phone] Pager [phone] After Hours Pager

## 2014-03-12 NOTE — Progress Notes (Signed)
SLP Cancellation Note  Patient Details Name: Sharon [name] MRN: [id number] DOB: 1935-02-21   Cancelled treatment:  Pt undergoing LP at time of SLP visit.  Will continue to follow for cognitive goals.           Juan Quam Laurice 03/12/2014, 3:55 PM

## 2014-03-12 NOTE — Progress Notes (Signed)
The patient is significantly improved following therapeutic CSF drainage from her lumbar puncture. I believe this confirms that much of the patient's mental status difficulty is from her communicating hydrocephalus. I recommended to her family that we proceed on Monday with placement of a right-sided occipital ventriculoperitoneal shunt. I've discussed the risks involved with surgery including but not limited to the risk of anesthesia, bleeding, infection, brain injury-including stroke coma and/or death, shunt malfunction and non-benefit. The patient and her family agree to proceed Monday.

## 2014-03-12 NOTE — Progress Notes (Signed)
UR completed.  Await placement of shunt and then can determine pt's needs for level of care for rehab at d/c.   Sandi Mariscal, RN BSN [address] CCM Trauma/Neuro ICU Case Manager [phone]

## 2014-03-12 NOTE — Progress Notes (Signed)
Patient remains confused albeit less agitated than yesterday. She is afebrile. Her vitals are stable.  Patient awake and alert. Pleasantly confused. Motor and sensory function intact bilaterally.  Plan lumbar puncture today with measurement of opening pressure and therapeutic drainage to see if this improves her clinical situation. If she does receive benefit and her opening pressure is elevated then I would plan for ventriculoperitoneal shunting on Monday.

## 2014-03-13 ENCOUNTER — Inpatient Hospital Stay (HOSPITAL_COMMUNITY): Payer: Medicare Other

## 2014-03-13 LAB — BASIC METABOLIC PANEL
ANION GAP: 6 (ref 5–15)
BUN: 8 mg/dL (ref 6–23)
CO2: 26 mmol/L (ref 19–32)
CREATININE: 0.68 mg/dL (ref 0.50–1.10)
Calcium: 8.4 mg/dL (ref 8.4–10.5)
Chloride: 102 mEq/L (ref 96–112)
GFR calc Af Amer: >90 mL/min (ref >90)
GFR, EST NON AFRICAN AMERICAN: 82 mL/min — AB (ref >90)
Glucose, Bld: 102 mg/dL — ABNORMAL HIGH (ref 70–99)
Potassium: 3.8 mmol/L (ref 3.5–5.1)
SODIUM: 134 mmol/L — AB (ref 135–145)

## 2014-03-13 LAB — MAGNESIUM: Magnesium: 2 mg/dL (ref 1.5–2.5)

## 2014-03-13 LAB — TROPONIN I: Troponin I: <0.03 ng/mL (ref <0.031)

## 2014-03-13 LAB — GLUCOSE, CAPILLARY
GLUCOSE-CAPILLARY: 115 mg/dL — AB (ref 70–99)
GLUCOSE-CAPILLARY: 156 mg/dL — AB (ref 70–99)
Glucose-Capillary: 140 mg/dL — ABNORMAL HIGH (ref 70–99)
Glucose-Capillary: 130 mg/dL — ABNORMAL HIGH (ref 70–99)

## 2014-03-13 MED ORDER — NIMODIPINE 30 MG PO CAPS
60.0000 mg | ORAL_CAPSULE | ORAL | Status: DC
  Administered 2014-03-13 – 2014-03-18 (×21): 60 mg via ORAL
  Filled 2014-03-13 (×34): qty 2

## 2014-03-13 MED ORDER — DEXAMETHASONE SODIUM PHOSPHATE 4 MG/ML IJ SOLN
1.0000 mg | Freq: Two times a day (BID) | INTRAMUSCULAR | Status: DC
  Administered 2014-03-13 – 2014-03-18 (×11): 1 mg via INTRAVENOUS
  Filled 2014-03-13 (×10): qty 0.25

## 2014-03-13 NOTE — Evaluation (Signed)
Clinical/Bedside Swallow Evaluation Patient Details  Name: Sharon [name] MRN: [id number] Date of Birth: 1934-08-20  Today's Date: 03/13/2014 Time: 0325-0345 SLP Time Calculation (min) (ACUTE ONLY): 20 min  Past Medical History:  Past Medical History  Diagnosis Date  . Dyslipidemia   . Cystitis 03/02/2014    acute   . UTI (urinary tract infection) 03/02/2014  . Arthritis     osteo   Past Surgical History:  Past Surgical History  Procedure Laterality Date  . Abdominal hysterectomy    . Radiology with anesthesia N/A 03/04/2014    Procedure: RADIOLOGY WITH ANESTHESIA;  Surgeon: Medication Radiologist, MD;  Location: [place];  Service: Radiology;  Laterality: N/A;  . Radiology with anesthesia N/A 03/04/2014    Procedure: RADIOLOGY WITH ANESTHESIA;  Surgeon: Rob Hickman, MD;  Location: [place];  Service: Radiology;  Laterality: N/A;   HPI:  80 year old female admitted 02/20/14 due to AMS, aneurysmal SAH. PMH significant for DLD. CXR negative, CT indicated nothing acute. MRI revealed Focal extension from the right posterior communicating artery compatible with any elongated aneurysm, measuring up to 11 mm.   Assessment / Plan / Recommendation Clinical Impression  Patient presents as WNL for oropharngeal swallow function. She did not exhibit any overt s/s aspiration or difficulty with tested boluses of puree and hard solids, thin liquids. Patient is able to initiate regular, thin liquids diet with no restrictions from a swallowing standpoint.    Aspiration Risk  None    Diet Recommendation Regular;Thin liquid   Liquid Administration via: Straw;Cup Medication Administration: Whole meds with liquid Supervision: Patient able to self feed;Intermittent supervision to cue for compensatory strategies Compensations: Slow rate;Small sips/bites Postural Changes and/or Swallow Maneuvers: Seated upright 90 degrees;Upright 30-60 min after meal    Other  Recommendations Oral Care  Recommendations: Oral care BID   Follow Up Recommendations  Inpatient Rehab    Frequency and Duration        Pertinent Vitals/Pain     SLP Swallow Goals     Swallow Study Prior Functional Status       General Date of Onset: 03/02/14 HPI: 80 year old female admitted 02/20/14 due to AMS, aneurysmal SAH. PMH significant for DLD. CXR negative, CT indicated nothing acute. MRI revealed Focal extension from the right posterior communicating artery compatible with any elongated aneurysm, measuring up to 11 mm. Type of Study: Bedside swallow evaluation Previous Swallow Assessment: none Diet Prior to this Study: NPO;Panda Temperature Spikes Noted: No Respiratory Status: Nasal cannula History of Recent Intubation: Yes Length of Intubations (days): 1 days Date extubated: 03/06/14 Behavior/Cognition: Alert;Cooperative;Pleasant mood Oral Cavity - Dentition: Dentures, top Self-Feeding Abilities: Needs assist (needs assist secondary to restraint gloves) Patient Positioning: Upright in bed Baseline Vocal Quality: Clear Volitional Cough: Strong Volitional Swallow: Able to elicit    Oral/Motor/Sensory Function Overall Oral Motor/Sensory Function: Appears within functional limits for tasks assessed   Ice Chips     Thin Liquid Thin Liquid: Within functional limits Presentation: Straw;Cup    Nectar Thick Nectar Thick Liquid: Not tested   Honey Thick Honey Thick Liquid: Not tested   Puree Puree: Within functional limits Presentation: Spoon   Solid   GO    Solid: Within functional limits       Dannial Monarch 03/13/2014,5:58 PM  Dannial Monarch, [place], CCC-SLP

## 2014-03-13 NOTE — Progress Notes (Signed)
Patient ID: Sharon [name], female   DOB: 1934-10-19, 80 y.o.   MRN: [id number] Subjective:  The patient is alert and pleasant. She is in no apparent distress.  Objective: Vital signs in last 24 hours: Temp:  [98 F (36.7 C)-99.2 F (37.3 C)] 98.5 F (36.9 C) (01/09 0741) Resp:  [13-21] 21 (01/09 0407) BP: (96-193)/(41-174) 130/61 mmHg (01/09 0407) SpO2:  [98 %-100 %] 100 % (01/09 0407) Weight:  [54.6 kg (120 lb 5.9 oz)] 54.6 kg (120 lb 5.9 oz) (01/09 0500)  Intake/Output from previous day: 01/08 0701 - 01/09 0700 In: 2255.2 [I.V.:1820; NG/GT:435.2] Out: -  Intake/Output this shift:    Physical exam the patient is alert and oriented 3. She is moving all 4 extremities. Her speech is normal.  Lab Results:  Recent Labs  03/11/14 1610 03/12/14 0500  WBC 14.2* 14.7*  HGB 12.6 12.1  HCT 37.2 36.9  PLT 290 280   BMET  Recent Labs  03/11/14 1610 03/12/14 0500  NA 136 134*  K 4.3 4.3  CL 104 104  CO2 25 24  GLUCOSE 81 98  BUN 11 11  CREATININE 0.51 0.58  CALCIUM 8.2* 7.7*    Studies/Results: Dg Abd Portable 1v  03/13/2014   CLINICAL DATA:  Feeding tube placement.  EXAM: PORTABLE ABDOMEN - 1 VIEW  COMPARISON:  03/11/2014  FINDINGS: Enteric tube tip is projected in the mid abdomen to the right of midline consistent with location in the distal stomach. Nonobstructive bowel gas pattern.  IMPRESSION: Enteric tube tip projects over the distal stomach.   Electronically Signed   By: Lucienne Capers M.D.   On: 03/13/2014 04:58   Dg Abd Portable 1v  03/11/2014   CLINICAL DATA:  NG feeding tube placement  EXAM: PORTABLE ABDOMEN - 1 VIEW  COMPARISON:  03/05/2014  FINDINGS: There is a NG feeding tube with tip in distal stomach. Nonspecific nonobstructive bowel gas pattern.  IMPRESSION: NG feeding tube with tip in distal stomach.   Electronically Signed   By: Lahoma Crocker M.D.   On: 03/11/2014 11:55    Assessment/Plan: Hydrocephalus: Dr. Annette Stable plans a ventriculoperitoneal shunt on  Monday.  LOS: 11 days     Tommey Barret D 03/13/2014, 8:23 AM

## 2014-03-13 NOTE — Progress Notes (Signed)
PULMONARY / CRITICAL CARE MEDICINE   Name: Sharon [name] MRN: [id number] DOB: 04/06/1934    ADMISSION DATE:  03/02/2014 CONSULTATION DATE:  03/05/14  REFERRING MD :  Dr. Garen Grams  REASON FOR CONSULTATION: Ventilator-dependent respiratory failure  INITIAL PRESENTATION:  80 year old woman, active smoker with a history of arthritis. She began to have headache and possibly some associated weakness dating back to the end of November. Since that time she had experienced some increased somnolence and off-and-on headache. Then 12/28 she was noted to be disoriented. She was admitted 12/29 for neurological evaluation. Lumbar puncture was consistent with subarachnoid bleeding. 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 She underwent coiling of the aneurysm on 03/05/14. Post procedure she was admitted to the ICU on mechanical ventilation.   STUDIES:  Head CT 12/29 >> indeterminate punctate calcification in the medial aspect of the right middle cranial fossa, possibly small meningioma LP 12/31 > xanthochromic, 7.8K RBC, 158 WBC (41 neutrophils). Glucose 36. Total protein 84. Gram stain negative CSF HSV 12/31 >>  Brain MRI 12/31 >> focal diffusion signal in the right sylvian fissure, right posterior indicating arterial elongated aneurysm, dilation of the lateral ventricles but no evidence of hydrocephalus   03/05/14 >> coiling of the right posterior communicating artery aneurysm. Also noted on angiography was a left internal carotid artery aneurysm  03/06/14 - extubated  03/07/14: Improved mental status. Sitting in chair. Talking . Trying to work with PT ambulating. But pleasantly confused 03/11/14: Worsening encephalopathy and never got transferred out of ICU. PCCM back on as primary. Remains extubated. Intermittently agitated. Has hydrocephalus per RN  SUBJECTIVE/OVERNIGHT/INTERVAL HX C/o headache and still impulsive   VITAL  SIGNS: Temp:  [98 F (36.7 C)-99.2 F (37.3 C)] 98.5 F (36.9 C) (01/09 0741) Resp:  [13-21] 21 (01/09 0407) BP: (96-193)/(41-174) 130/61 mmHg (01/09 0407) SpO2:  [98 %-100 %] 100 % (01/09 0407) Weight:  [54.6 kg (120 lb 5.9 oz)] 54.6 kg (120 lb 5.9 oz) (01/09 0500) Room air  HEMODYNAMICS:   VENTILATOR SETTINGS:   INTAKE / OUTPUT:  Intake/Output Summary (Last 24 hours) at 03/13/14 2707 Last data filed at 03/13/14 0700  Gross per 24 hour  Intake 2180.17 ml  Output      0 ml  Net 2180.17 ml    PHYSICAL EXAMINATION: General: frail female awake, appropriate currently but very impulsive  Neuro: short term memory def. Moves all ext. Impulsive  HEENT:  Pupils pin point Cardiovascular:  regular Lungs:  No wheeze Abdomen:  Soft, non tender Musculoskeletal:  No edema Skin:  No rashes  LABS: PULMONARY No results for input(s): PHART, PCO2ART, PO2ART, HCO3, TCO2, O2SAT in the last 168 hours.  Invalid input(s): PCO2, PO2  CBC  Recent Labs Lab 03/10/14 0324 03/11/14 1610 03/12/14 0500  HGB 11.5* 12.6 12.1  HCT 33.8* 37.2 36.9  WBC 10.3 14.2* 14.7*  PLT 246 290 280    COAGULATION No results for input(s): INR in the last 168 hours.  CARDIAC  No results for input(s): TROPONINI in the last 168 hours. No results for input(s): PROBNP in the last 168 hours.   CHEMISTRY  Recent Labs Lab 03/08/14 0253 03/09/14 0220 03/10/14 0324 03/11/14 1610 03/12/14 0500  NA 141 138 140 136 134*  K 3.8 4.0 4.1 4.3 4.3  CL 112 107 107 104 104  CO2 24 24 28 25 24   GLUCOSE 127* 128* 129* 81 98  BUN 10 <5* 7 11  11  CREATININE 0.57 0.47* 0.54 0.51 0.58  CALCIUM 7.5* 7.5* 8.0* 8.2* 7.7*  MG 2.4 2.0 2.0 2.1 2.3  PHOS 2.0* 2.4 2.6 3.2 3.7   Estimated Creatinine Clearance: 48.7 mL/min (by C-G formula based on Cr of 0.58).  LIVER  Recent Labs Lab 03/10/14 0324 03/11/14 1610  AST 27 25  ALT 46* 42*  ALKPHOS 67 64  BILITOT 0.4 0.6  PROT 5.0* 5.7*  ALBUMIN 2.9* 3.2*     INFECTIOUS No results for input(s): LATICACIDVEN, PROCALCITON in the last 168 hours.  ENDOCRINE CBG (last 3)   Recent Labs  03/12/14 1930 03/13/14 0019 03/13/14 0353  GLUCAP 98 156* 140*    IMAGING x48h Dg Abd Portable 1v  03/13/2014   CLINICAL DATA:  Feeding tube placement.  EXAM: PORTABLE ABDOMEN - 1 VIEW  COMPARISON:  03/11/2014  FINDINGS: Enteric tube tip is projected in the mid abdomen to the right of midline consistent with location in the distal stomach. Nonobstructive bowel gas pattern.  IMPRESSION: Enteric tube tip projects over the distal stomach.   Electronically Signed   By: Lucienne Capers M.D.   On: 03/13/2014 04:58   Dg Abd Portable 1v  03/11/2014   CLINICAL DATA:  NG feeding tube placement  EXAM: PORTABLE ABDOMEN - 1 VIEW  COMPARISON:  03/05/2014  FINDINGS: There is a NG feeding tube with tip in distal stomach. Nonspecific nonobstructive bowel gas pattern.  IMPRESSION: NG feeding tube with tip in distal stomach.   Electronically Signed   By: Lahoma Crocker M.D.   On: 03/11/2014 11:55       ASSESSMENT / PLAN:  PULMONARY ETT 03/05/14 >> 03/06/14 A:  Acute respiratory failure post procedure. Resolved History of tobacco use.  - s/p successful extubation 02/03/15. Current encephalopathy puts her at risk for intubation P:   Monitor clinically Pulm Hygiene  CARDIOVASCULAR A:  Hx of HLD. P:  Continue zocor  RENAL A:   Hypokalemia, resolved P:   Follow BMP intermittently    GASTROINTESTINAL A:   Nutrition. Dysphagia  P:   H2 blockade for SUP NPO due to confusion Repeat swallow eval  Place panda and start tube feeds  HEMATOLOGIC A:   DVT prophylaxis. P:  SCD  INFECTIOUS Culture - negative A:   Fever likely was due to [facility]. Afebrile as of 03/12/14 P:   Monitor off abx  Ceftriaxone 12/30 >> 1/01 Ampicillin 12/30 >> 1/01 Acyclovir 12/30 >> 1/01 Vancomycin 12/31 >> 1/01  ENDOCRINE A:   No acute issues. P:   Monitor blood sugar on  BMET  NEUROLOGIC A:   Acute encephalopathy 2nd to grade 3 SAH.PCOM aneurysm coiling 03/05/14 Obstructive hydrocephalus   - improved 03/07/14 but is now at vasospasm risk phase and CT shows hydro with ongoing confusion 03/11/14. On 03/12/14: improved after decadron decrease and haldol prn P:   Neuro to place shunt 03/15/14 Nimotop and Zocor for vasospasm prophylaxis to continue per neurosurgery; place panda for same Continue saline infusion  Haldol Prn  (ensure Qtc < 533msec) Wean off decadron - reduced 03/11/2014   - Inter-disciplinary family meet or Palliative Care meeting due by 03/12/14:  NP CLINICAL SUMMARY Looks good but nursing reports wide range of behavioral swings and very impulsive. Will continue supportive care w/ plan to have shunt on 1/11.   Erick Colace ACNP-BC [place] Pager # [phone] OR # [phone] if no answer 03/13/2014 8:21 AM  Attending Note:  I have examined patient, reviewed labs, studies and  notes. I have discussed the case with Jerrye Bushy, and I agree with the data and plans as amended above.   Baltazar Apo, MD, PhD 03/13/2014, 12:50 PM [place] [phone] or if no answer [phone]

## 2014-03-13 NOTE — Progress Notes (Signed)
[place] Progress Note Patient Name: Sharon [name] DOB: [birth year] MRN: [id number]   Date of Service  03/13/2014  HPI/Events of Note  Rhythm change on tele >> slow a fib with HR 85 BP 119/70.   eICU Interventions  Will check ECG, BMET, Mg, cardiac enzymes and monitor hemodynamics.      Intervention Category Major Interventions: Other:  Deziah Renwick 03/13/2014, 9:16 PM

## 2014-03-14 LAB — COMPREHENSIVE METABOLIC PANEL
ALBUMIN: 3 g/dL — AB (ref 3.5–5.2)
ALT: 26 U/L (ref 0–35)
AST: 20 U/L (ref 0–37)
Alkaline Phosphatase: 69 U/L (ref 39–117)
Anion gap: 7 (ref 5–15)
BUN: 9 mg/dL (ref 6–23)
CHLORIDE: 103 meq/L (ref 96–112)
CO2: 26 mmol/L (ref 19–32)
CREATININE: 0.64 mg/dL (ref 0.50–1.10)
Calcium: 8.5 mg/dL (ref 8.4–10.5)
GFR calc Af Amer: >90 mL/min (ref >90)
GFR, EST NON AFRICAN AMERICAN: 84 mL/min — AB (ref >90)
Glucose, Bld: 101 mg/dL — ABNORMAL HIGH (ref 70–99)
POTASSIUM: 4.1 mmol/L (ref 3.5–5.1)
Sodium: 136 mmol/L (ref 135–145)
Total Bilirubin: 0.6 mg/dL (ref 0.3–1.2)
Total Protein: 5.4 g/dL — ABNORMAL LOW (ref 6.0–8.3)

## 2014-03-14 LAB — TROPONIN I: Troponin I: <0.03 ng/mL (ref <0.031)

## 2014-03-14 LAB — CBC
HCT: 39 % (ref 36.0–46.0)
HEMOGLOBIN: 13 g/dL (ref 12.0–15.0)
MCH: 31.4 pg (ref 26.0–34.0)
MCHC: 33.3 g/dL (ref 30.0–36.0)
MCV: 94.2 fL (ref 78.0–100.0)
Platelets: 300 10*3/uL (ref 150–400)
RBC: 4.14 MIL/uL (ref 3.87–5.11)
RDW: 13.6 % (ref 11.5–15.5)
WBC: 11.6 10*3/uL — AB (ref 4.0–10.5)

## 2014-03-14 MED ORDER — CEFAZOLIN SODIUM-DEXTROSE 2-3 GM-% IV SOLR
2.0000 g | INTRAVENOUS | Status: AC
  Administered 2014-03-15: 2 g via INTRAVENOUS
  Filled 2014-03-14 (×2): qty 50

## 2014-03-14 MED ORDER — ACETAMINOPHEN 325 MG PO TABS
650.0000 mg | ORAL_TABLET | Freq: Four times a day (QID) | ORAL | Status: DC | PRN
  Administered 2014-03-14 – 2014-03-17 (×3): 650 mg via ORAL
  Filled 2014-03-14 (×3): qty 2

## 2014-03-14 NOTE — Progress Notes (Signed)
Patient ID: Sharon [name], female   DOB: 12/16/1934, 80 y.o.   MRN: [id number] Subjective:  The patient is alert and pleasant. I spoke with her daughter.  Objective: Vital signs in last 24 hours: Temp:  [98.1 F (36.7 C)-98.5 F (36.9 C)] 98.3 F (36.8 C) (01/10 0400) Resp:  [12-22] 16 (01/10 0419) BP: (91-137)/(49-77) 91/51 mmHg (01/10 0419) SpO2:  [96 %-100 %] 96 % (01/09 2200) Weight:  [55.9 kg (123 lb 3.8 oz)] 55.9 kg (123 lb 3.8 oz) (01/10 0354)  Intake/Output from previous day: 01/09 0701 - 01/10 0700 In: 2070 [P.O.:480; I.V.:1050; NG/GT:540] Out: 650 [Urine:650] Intake/Output this shift:    Physical exam the patient is alert and pleasant. She is oriented. She is moving all 4 extremities well. Her speech is normal.  Lab Results:  Recent Labs  03/12/14 0500 03/14/14 0345  WBC 14.7* 11.6*  HGB 12.1 13.0  HCT 36.9 39.0  PLT 280 300   BMET  Recent Labs  03/13/14 2116 03/14/14 0345  NA 134* 136  K 3.8 4.1  CL 102 103  CO2 26 26  GLUCOSE 102* 101*  BUN 8 9  CREATININE 0.68 0.64  CALCIUM 8.4 8.5    Studies/Results: Dg Abd Portable 1v  03/13/2014   CLINICAL DATA:  Feeding tube placement.  EXAM: PORTABLE ABDOMEN - 1 VIEW  COMPARISON:  03/11/2014  FINDINGS: Enteric tube tip is projected in the mid abdomen to the right of midline consistent with location in the distal stomach. Nonobstructive bowel gas pattern.  IMPRESSION: Enteric tube tip projects over the distal stomach.   Electronically Signed   By: Lucienne Capers M.D.   On: 03/13/2014 04:58    Assessment/Plan: Hydrocephalus: Dr. Annette Stable plans a ventriculoperitoneal shunt tomorrow. I have answered all their questions.  LOS: 12 days     Asif Muchow D 03/14/2014, 8:17 AM

## 2014-03-14 NOTE — Progress Notes (Signed)
PULMONARY / CRITICAL CARE MEDICINE   Name: Sharon [name] MRN: [id number] DOB: Nov 03, 1934    ADMISSION DATE:  03/02/2014 CONSULTATION DATE:  03/05/14  REFERRING MD :  Dr. Garen Grams  REASON FOR CONSULTATION: Ventilator-dependent respiratory failure  INITIAL PRESENTATION:  80 year old woman, active smoker with a history of arthritis. She began to have headache and possibly some associated weakness dating back to the end of November. Since that time she had experienced some increased somnolence and off-and-on headache. Then 12/28 she was noted to be disoriented. She was admitted 12/29 for neurological evaluation. Lumbar puncture was consistent with subarachnoid bleeding. 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 She underwent coiling of the aneurysm on 03/05/14. Post procedure she was admitted to the ICU on mechanical ventilation.   STUDIES:  Head CT 12/29 >> indeterminate punctate calcification in the medial aspect of the right middle cranial fossa, possibly small meningioma LP 12/31 > xanthochromic, 7.8K RBC, 158 WBC (41 neutrophils). Glucose 36. Total protein 84. Gram stain negative CSF HSV 12/31 >>  Brain MRI 12/31 >> focal diffusion signal in the right sylvian fissure, right posterior indicating arterial elongated aneurysm, dilation of the lateral ventricles but no evidence of hydrocephalus 03/05/14 >> coiling of the right posterior communicating artery aneurysm. Also noted on angiography was a lft internal carotid artery aneurysm 03/06/14 - extubated 03/07/14: Improved mental status. Sitting in chair. Talking . Trying to work with PT ambulating. But pleasantly confused 03/11/14: Worsening encephalopathy and never got transferred out of ICU. PCCM back on as primary. Remains extubated. Intermittently agitated. Has hydrocephalus per RN 1/9: passed SBT   SUBJECTIVE/OVERNIGHT/INTERVAL HX No HA today.   VITAL  SIGNS: Temp:  [97.6 F (36.4 C)-98.5 F (36.9 C)] 97.6 F (36.4 C) (01/10 0800) Resp:  [12-22] 16 (01/10 0419) BP: (91-137)/(49-77) 91/51 mmHg (01/10 0419) SpO2:  [96 %-100 %] 96 % (01/09 2200) Weight:  [55.9 kg (123 lb 3.8 oz)] 55.9 kg (123 lb 3.8 oz) (01/10 0354) Room air  HEMODYNAMICS:   VENTILATOR SETTINGS:   INTAKE / OUTPUT:  Intake/Output Summary (Last 24 hours) at 03/14/14 0828 Last data filed at 03/14/14 0700  Gross per 24 hour  Intake   2010 ml  Output    650 ml  Net   1360 ml    PHYSICAL EXAMINATION: General: frail female awake, appropriate currently  Neuro: short term memory def. Moves all ext. Impulsive-->no sig change c/w 1/9  HEENT: PERRL Cardiovascular:  regular Lungs:  No wheeze,clear  Abdomen:  Soft, non tender Musculoskeletal:  No edema Skin:  No rashes  LABS: PULMONARY No results for input(s): PHART, PCO2ART, PO2ART, HCO3, TCO2, O2SAT in the last 168 hours.  Invalid input(s): PCO2, PO2  CBC  Recent Labs Lab 03/11/14 1610 03/12/14 0500 03/14/14 0345  HGB 12.6 12.1 13.0  HCT 37.2 36.9 39.0  WBC 14.2* 14.7* 11.6*  PLT 290 280 300    COAGULATION No results for input(s): INR in the last 168 hours.  CARDIAC    Recent Labs Lab 03/13/14 2116 03/14/14 0345  TROPONINI <0.03 <0.03   No results for input(s): PROBNP in the last 168 hours.   CHEMISTRY  Recent Labs Lab 03/08/14 0253 03/09/14 0220 03/10/14 0324 03/11/14 1610 03/12/14 0500 03/13/14 2116 03/14/14 0345  NA 141 138 140 136 134* 134* 136  K 3.8 4.0 4.1 4.3 4.3 3.8 4.1  CL 112 107 107 104 104 102 103  CO2 24 24 28  25  24 26 26   GLUCOSE 127* 128* 129* 81 98 102* 101*  BUN 10 <5* 7 11 11 8 9   CREATININE 0.57 0.47* 0.54 0.51 0.58 0.68 0.64  CALCIUM 7.5* 7.5* 8.0* 8.2* 7.7* 8.4 8.5  MG 2.4 2.0 2.0 2.1 2.3 2.0  --   PHOS 2.0* 2.4 2.6 3.2 3.7  --   --    Estimated Creatinine Clearance: 48.7 mL/min (by C-G formula based on Cr of 0.64).  LIVER  Recent Labs Lab  03/10/14 0324 03/11/14 1610 03/14/14 0345  AST 27 25 20   ALT 46* 42* 26  ALKPHOS 67 64 69  BILITOT 0.4 0.6 0.6  PROT 5.0* 5.7* 5.4*  ALBUMIN 2.9* 3.2* 3.0*    INFECTIOUS No results for input(s): LATICACIDVEN, PROCALCITON in the last 168 hours.  ENDOCRINE CBG (last 3)   Recent Labs  03/13/14 0353 03/13/14 0734 03/13/14 1141  GLUCAP 140* 115* 130*    IMAGING x48h Dg Abd Portable 1v  03/13/2014   CLINICAL DATA:  Feeding tube placement.  EXAM: PORTABLE ABDOMEN - 1 VIEW  COMPARISON:  03/11/2014  FINDINGS: Enteric tube tip is projected in the mid abdomen to the right of midline consistent with location in the distal stomach. Nonobstructive bowel gas pattern.  IMPRESSION: Enteric tube tip projects over the distal stomach.   Electronically Signed   By: Lucienne Capers M.D.   On: 03/13/2014 04:58   ASSESSMENT / PLAN:  PULMONARY ETT 03/05/14 >> 03/06/14 A:  Acute respiratory failure post procedure. Resolved History of tobacco use.  - s/p successful extubation 03/06/14. Current encephalopathy puts her at risk for intubation P:   Monitor clinically Pulm Hygiene  CARDIOVASCULAR A:  Hx of HLD. P:  Continue zocor  RENAL A:   Hypokalemia, resolved P:   Follow BMP intermittently    GASTROINTESTINAL A:   Nutrition. Dysphagia-->passed swallow eval 1/9 P:   H2 blockade for SUP Cont diet as tolerated   HEMATOLOGIC A:   DVT prophylaxis. P:  SCD  INFECTIOUS Culture - negative A:   Fever likely was due to [facility]. Afebrile as of 03/12/14 P:   Monitor off abx  Ceftriaxone 12/30 >> 1/01 Ampicillin 12/30 >> 1/01 Acyclovir 12/30 >> 1/01 Vancomycin 12/31 >> 1/01  ENDOCRINE A:   No acute issues. P:   Monitor blood sugar on BMET  NEUROLOGIC A:   Acute encephalopathy 2nd to grade 3 SAH.PCOM aneurysm coiling 03/05/14 Obstructive hydrocephalus   - improved 03/07/14 but is now at vasospasm risk phase and CT shows hydro with ongoing confusion 03/11/14. On 03/12/14: improved after  decadron decrease and haldol prn P:   Neuro to place shunt 03/15/14 Nimotop and Zocor for vasospasm prophylaxis to continue per neurosurgery Continue saline infusion  Haldol Prn  (ensure Qtc < 589msec) Wean off decadron - reduced 03/13/2014   - Inter-disciplinary family meet or Palliative Care meeting due by 03/12/14:  NP CLINICAL SUMMARY Looks good but nursing reports wide range of behavioral swings and very impulsive, but better now that feeding tube is out  Erick Colace ACNP-BC [place] Pager # [phone] OR # [phone] if no answer 03/14/2014 8:28 AM  Attending Note:  I have examined patient, reviewed labs, studies and notes. I have discussed the case with Jerrye Bushy, and I agree with the data and plans as amended above.    Baltazar Apo, MD, PhD 03/14/2014, 5:57 PM [place] [facility] [phone] or if no answer [phone]

## 2014-03-15 ENCOUNTER — Inpatient Hospital Stay (HOSPITAL_COMMUNITY): Payer: Medicare Other | Admitting: Anesthesiology

## 2014-03-15 ENCOUNTER — Inpatient Hospital Stay (HOSPITAL_COMMUNITY): Payer: Medicare Other

## 2014-03-15 ENCOUNTER — Encounter (HOSPITAL_COMMUNITY)
Admission: EM | Disposition: A | Discharge status: Rehabilitation Facility | Payer: Self-pay | Source: Home / Self Care | Attending: Internal Medicine

## 2014-03-15 ENCOUNTER — Encounter (HOSPITAL_COMMUNITY): Payer: Self-pay | Admitting: Anesthesiology

## 2014-03-15 DIAGNOSIS — R404 Transient alteration of awareness: Secondary | ICD-10-CM

## 2014-03-15 HISTORY — PX: VENTRICULOPERITONEAL SHUNT: SHX204

## 2014-03-15 LAB — COMPREHENSIVE METABOLIC PANEL
ALBUMIN: 2.9 g/dL — AB (ref 3.5–5.2)
ALK PHOS: 64 U/L (ref 39–117)
ALT: 25 U/L (ref 0–35)
AST: 18 U/L (ref 0–37)
Anion gap: 6 (ref 5–15)
BUN: 12 mg/dL (ref 6–23)
CALCIUM: 8.8 mg/dL (ref 8.4–10.5)
CO2: 26 mmol/L (ref 19–32)
Chloride: 102 mEq/L (ref 96–112)
Creatinine, Ser: 0.63 mg/dL (ref 0.50–1.10)
GFR calc Af Amer: >90 mL/min (ref >90)
GFR calc non Af Amer: 84 mL/min — ABNORMAL LOW (ref >90)
Glucose, Bld: 105 mg/dL — ABNORMAL HIGH (ref 70–99)
POTASSIUM: 4.4 mmol/L (ref 3.5–5.1)
Sodium: 134 mmol/L — ABNORMAL LOW (ref 135–145)
TOTAL PROTEIN: 5.5 g/dL — AB (ref 6.0–8.3)
Total Bilirubin: 0.4 mg/dL (ref 0.3–1.2)

## 2014-03-15 LAB — CBC
HCT: 39.6 % (ref 36.0–46.0)
Hemoglobin: 13.3 g/dL (ref 12.0–15.0)
MCH: 32.4 pg (ref 26.0–34.0)
MCHC: 33.6 g/dL (ref 30.0–36.0)
MCV: 96.6 fL (ref 78.0–100.0)
Platelets: 284 10*3/uL (ref 150–400)
RBC: 4.1 MIL/uL (ref 3.87–5.11)
RDW: 13.7 % (ref 11.5–15.5)
WBC: 9.6 10*3/uL (ref 4.0–10.5)

## 2014-03-15 SURGERY — SHUNT INSERTION VENTRICULAR-PERITONEAL
Anesthesia: General | Site: Head | Laterality: Right

## 2014-03-15 MED ORDER — LIDOCAINE HCL (CARDIAC) 20 MG/ML IV SOLN
INTRAVENOUS | Status: AC
Start: 2014-03-15 — End: 2014-03-15
  Filled 2014-03-15: qty 5

## 2014-03-15 MED ORDER — LIDOCAINE HCL (CARDIAC) 20 MG/ML IV SOLN
INTRAVENOUS | Status: DC | PRN
  Administered 2014-03-15: 100 mg via INTRAVENOUS

## 2014-03-15 MED ORDER — ONDANSETRON HCL 4 MG/2ML IJ SOLN: 4.0000 mg | Freq: Once | INTRAMUSCULAR | Status: AC | PRN

## 2014-03-15 MED ORDER — FENTANYL CITRATE 0.05 MG/ML IJ SOLN
INTRAMUSCULAR | Status: DC | PRN
  Administered 2014-03-15: 50 ug via INTRAVENOUS
  Administered 2014-03-15: 100 ug via INTRAVENOUS
  Administered 2014-03-15: 50 ug via INTRAVENOUS

## 2014-03-15 MED ORDER — CHLORHEXIDINE GLUCONATE 4 % EX LIQD
CUTANEOUS | Status: AC
  Filled 2014-03-15: qty 15

## 2014-03-15 MED ORDER — ROCURONIUM BROMIDE 50 MG/5ML IV SOLN
INTRAVENOUS | Status: AC
  Filled 2014-03-15: qty 1

## 2014-03-15 MED ORDER — HYDROMORPHONE HCL 1 MG/ML IJ SOLN
0.2500 mg | INTRAMUSCULAR | Status: DC | PRN
  Administered 2014-03-15 (×4): 0.5 mg via INTRAVENOUS
  Filled 2014-03-15: qty 1

## 2014-03-15 MED ORDER — EPHEDRINE SULFATE 50 MG/ML IJ SOLN
INTRAMUSCULAR | Status: DC | PRN
  Administered 2014-03-15 (×2): 10 mg via INTRAVENOUS

## 2014-03-15 MED ORDER — PROPOFOL 10 MG/ML IV BOLUS
INTRAVENOUS | Status: AC
  Filled 2014-03-15: qty 20

## 2014-03-15 MED ORDER — LIDOCAINE HCL 4 % MT SOLN
OROMUCOSAL | Status: DC | PRN
  Administered 2014-03-15: 5 mL via TOPICAL

## 2014-03-15 MED ORDER — ONDANSETRON HCL 4 MG/2ML IJ SOLN
INTRAMUSCULAR | Status: DC | PRN
  Administered 2014-03-15: 4 mg via INTRAVENOUS

## 2014-03-15 MED ORDER — THROMBIN 5000 UNITS EX SOLR
CUTANEOUS | Status: DC | PRN
  Administered 2014-03-15 (×2): 5000 [IU] via TOPICAL

## 2014-03-15 MED ORDER — HYDROMORPHONE HCL 1 MG/ML IJ SOLN
0.5000 mg | INTRAMUSCULAR | Status: DC | PRN
  Administered 2014-03-16 (×3): 0.5 mg via INTRAVENOUS
  Filled 2014-03-15 (×3): qty 1

## 2014-03-15 MED ORDER — PHENYLEPHRINE HCL 10 MG/ML IJ SOLN
INTRAMUSCULAR | Status: DC | PRN
  Administered 2014-03-15 (×3): 80 ug via INTRAVENOUS

## 2014-03-15 MED ORDER — SUCCINYLCHOLINE CHLORIDE 20 MG/ML IJ SOLN
INTRAMUSCULAR | Status: DC | PRN
  Administered 2014-03-15: 80 mg via INTRAVENOUS

## 2014-03-15 MED ORDER — FENTANYL CITRATE 0.05 MG/ML IJ SOLN
INTRAMUSCULAR | Status: AC
  Filled 2014-03-15: qty 5

## 2014-03-15 MED ORDER — HYDROMORPHONE HCL 1 MG/ML IJ SOLN
INTRAMUSCULAR | Status: AC
  Filled 2014-03-15: qty 1

## 2014-03-15 MED ORDER — SODIUM CHLORIDE 0.9 % IR SOLN
Status: DC | PRN
  Administered 2014-03-15: 18:00:00

## 2014-03-15 MED ORDER — LIDOCAINE HCL 1 % IJ SOLN
INTRAMUSCULAR | Status: AC
  Filled 2014-03-15: qty 20

## 2014-03-15 MED ORDER — ONDANSETRON HCL 4 MG/2ML IJ SOLN
INTRAMUSCULAR | Status: AC
  Filled 2014-03-15: qty 2

## 2014-03-15 MED ORDER — HEMOSTATIC AGENTS (NO CHARGE) OPTIME
TOPICAL | Status: DC | PRN
  Administered 2014-03-15: 1 via TOPICAL

## 2014-03-15 MED ORDER — 0.9 % SODIUM CHLORIDE (POUR BTL) OPTIME
TOPICAL | Status: DC | PRN
  Administered 2014-03-15: 1000 mL

## 2014-03-15 MED ORDER — CEFAZOLIN SODIUM 1-5 GM-% IV SOLN
1.0000 g | Freq: Three times a day (TID) | INTRAVENOUS | Status: AC
  Administered 2014-03-16 (×3): 1 g via INTRAVENOUS
  Filled 2014-03-15 (×3): qty 50

## 2014-03-15 MED ORDER — PROPOFOL 10 MG/ML IV BOLUS
INTRAVENOUS | Status: DC | PRN
  Administered 2014-03-15: 110 mg via INTRAVENOUS

## 2014-03-15 SURGICAL SUPPLY — 77 items
APL SKNCLS STERI-STRIP NONHPOA (GAUZE/BANDAGES/DRESSINGS)
BAG DECANTER FOR FLEXI CONT (MISCELLANEOUS) ×3 IMPLANT
BANDAGE ADH SHEER 1  50/CT (GAUZE/BANDAGES/DRESSINGS) ×3 IMPLANT
BENZOIN TINCTURE PRP APPL 2/3 (GAUZE/BANDAGES/DRESSINGS) ×1 IMPLANT
BLADE SURG 11 STRL SS (BLADE) ×3 IMPLANT
BNDG GAUZE ELAST 4 BULKY (GAUZE/BANDAGES/DRESSINGS) IMPLANT
BRUSH SCRUB EZ 1% IODOPHOR (MISCELLANEOUS) IMPLANT
BRUSH SCRUB EZ PLAIN DRY (MISCELLANEOUS) ×5 IMPLANT
BUR ACORN 6.0 PRECISION (BURR) ×2 IMPLANT
BUR ACORN 6.0MM PRECISION (BURR) ×1
CANISTER SUCT 3000ML (MISCELLANEOUS) ×3 IMPLANT
CATH VENTRICULAR 9CM (CATHETERS) ×4 IMPLANT
CLIP RANEY DISP (INSTRUMENTS) IMPLANT
CONT SPEC 4OZ CLIKSEAL STRL BL (MISCELLANEOUS) ×2 IMPLANT
DRAPE INCISE IOBAN 85X60 (DRAPES) ×3 IMPLANT
DRAPE ORTHO SPLIT 77X108 STRL (DRAPES) ×6
DRAPE POUCH INSTRU U-SHP 10X18 (DRAPES) ×3 IMPLANT
DRAPE SURG 17X23 STRL (DRAPES) IMPLANT
DRAPE SURG ORHT 6 SPLT 77X108 (DRAPES) ×2 IMPLANT
DRSG TELFA 3X8 NADH (GAUZE/BANDAGES/DRESSINGS) IMPLANT
ELECT REM PT RETURN 9FT ADLT (ELECTROSURGICAL) ×3
ELECTRODE REM PT RTRN 9FT ADLT (ELECTROSURGICAL) ×1 IMPLANT
GAUZE SPONGE 4X4 12PLY STRL (GAUZE/BANDAGES/DRESSINGS) ×3 IMPLANT
GAUZE SPONGE 4X4 16PLY XRAY LF (GAUZE/BANDAGES/DRESSINGS) IMPLANT
GLOVE BIO SURGEON STRL SZ 6.5 (GLOVE) ×6 IMPLANT
GLOVE BIO SURGEON STRL SZ7 (GLOVE) ×2 IMPLANT
GLOVE BIO SURGEONS STRL SZ 6.5 (GLOVE) ×3
GLOVE BIOGEL PI IND STRL 6.5 (GLOVE) IMPLANT
GLOVE BIOGEL PI INDICATOR 6.5 (GLOVE) ×2
GLOVE ECLIPSE 9.0 STRL (GLOVE) ×9 IMPLANT
GLOVE EXAM NITRILE LRG STRL (GLOVE) IMPLANT
GLOVE EXAM NITRILE MD LF STRL (GLOVE) IMPLANT
GLOVE EXAM NITRILE XL STR (GLOVE) IMPLANT
GLOVE EXAM NITRILE XS STR PU (GLOVE) IMPLANT
GOWN STRL REUS W/ TWL LRG LVL3 (GOWN DISPOSABLE) IMPLANT
GOWN STRL REUS W/ TWL XL LVL3 (GOWN DISPOSABLE) IMPLANT
GOWN STRL REUS W/TWL 2XL LVL3 (GOWN DISPOSABLE) IMPLANT
GOWN STRL REUS W/TWL LRG LVL3 (GOWN DISPOSABLE) ×3
GOWN STRL REUS W/TWL XL LVL3 (GOWN DISPOSABLE) ×3
HEMOSTAT SURGICEL 2X14 (HEMOSTASIS) IMPLANT
KIT BASIN OR (CUSTOM PROCEDURE TRAY) ×3 IMPLANT
KIT ROOM TURNOVER OR (KITS) ×3 IMPLANT
NDL BLUNT 16X1.5 OR ONLY (NEEDLE) IMPLANT
NEEDLE BLUNT 16X1.5 OR ONLY (NEEDLE) IMPLANT
NS IRRIG 1000ML POUR BTL (IV SOLUTION) ×3 IMPLANT
PACK LAMINECTOMY NEURO (CUSTOM PROCEDURE TRAY) ×3 IMPLANT
PAD ARMBOARD 7.5X6 YLW CONV (MISCELLANEOUS) ×15 IMPLANT
RUBBERBAND STERILE (MISCELLANEOUS) IMPLANT
SHEATH PERITONEAL INTRO 46 (MISCELLANEOUS) IMPLANT
SHEATH PERITONEAL INTRO 61 (MISCELLANEOUS) ×2 IMPLANT
SHUNT STRATA 11 SNAP REG (Shunt) ×2 IMPLANT
SPONGE INTESTINAL PEANUT (DISPOSABLE) IMPLANT
SPONGE LAP 4X18 X RAY DECT (DISPOSABLE) IMPLANT
SPONGE SURGIFOAM ABS GEL SZ50 (HEMOSTASIS) ×3 IMPLANT
STAPLER VISISTAT 35W (STAPLE) ×3 IMPLANT
STRATA 11 SNAP SHUNT ASSEMBLY REGULAR ×2 IMPLANT
SUT CHROMIC 3 0 SH 27 (SUTURE) IMPLANT
SUT ETHILON 3 0 FSL (SUTURE) IMPLANT
SUT ETHILON 4 0 PS 2 18 (SUTURE) IMPLANT
SUT NURALON 4 0 TR CR/8 (SUTURE) IMPLANT
SUT SILK 0 TIES 10X30 (SUTURE) IMPLANT
SUT SILK 2 0 TIES 17X18 (SUTURE) ×3
SUT SILK 2-0 18XBRD TIE BLK (SUTURE) ×1 IMPLANT
SUT SILK 3 0 SH 30 (SUTURE) IMPLANT
SUT VIC AB 2-0 CT2 18 VCP726D (SUTURE) ×5 IMPLANT
SUT VIC AB 3-0 SH 8-18 (SUTURE) ×3 IMPLANT
SUT VICRYL 4-0 PS2 18IN ABS (SUTURE) IMPLANT
SYR 5ML LL (SYRINGE) IMPLANT
SYR CONTROL 10ML LL (SYRINGE) ×1 IMPLANT
Snap Shunt Assembly ×1 IMPLANT
TAPE CLOTH SURG 4X10 WHT LF (GAUZE/BANDAGES/DRESSINGS) ×6 IMPLANT
TOWEL OR 17X24 6PK STRL BLUE (TOWEL DISPOSABLE) ×3 IMPLANT
TOWEL OR 17X26 10 PK STRL BLUE (TOWEL DISPOSABLE) ×3 IMPLANT
TRAY FOLEY CATH 14FRSI W/METER (CATHETERS) IMPLANT
UNDERPAD 30X30 INCONTINENT (UNDERPADS AND DIAPERS) ×1 IMPLANT
WATER STERILE IRR 1000ML POUR (IV SOLUTION) ×3 IMPLANT
shunt ventricular catheter ×2 IMPLANT

## 2014-03-15 NOTE — Progress Notes (Signed)
PULMONARY / CRITICAL CARE MEDICINE   Name: Sharon [name] MRN: [id number] DOB: Dec 02, 1934    ADMISSION DATE:  03/02/2014 CONSULTATION DATE:  03/05/14  REFERRING MD :  Dr. Garen Grams  REASON FOR CONSULTATION: Ventilator-dependent respiratory failure  INITIAL PRESENTATION:  80 year old woman, active smoker with a history of arthritis. She began to have headache and possibly some associated weakness dating back to the end of November. Since that time she had experienced some increased somnolence and off-and-on headache. Then 12/28 she was noted to be disoriented. She was admitted 12/29 for neurological evaluation. Lumbar puncture was consistent with subarachnoid bleeding. 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 She underwent coiling of the aneurysm on 03/05/14. Post procedure she was admitted to the ICU on mechanical ventilation.   STUDIES:  Head CT 12/29 >> indeterminate punctate calcification in the medial aspect of the right middle cranial fossa, possibly small meningioma LP 12/31 > xanthochromic, 7.8K RBC, 158 WBC (41 neutrophils). Glucose 36. Total protein 84. Gram stain negative CSF HSV 12/31 >>  Brain MRI 12/31 >> focal diffusion signal in the right sylvian fissure, right posterior indicating arterial elongated aneurysm, dilation of the lateral ventricles but no evidence of hydrocephalus 03/05/14 >> coiling of the right posterior communicating artery aneurysm. Also noted on angiography was a lft internal carotid artery aneurysm 03/06/14 - extubated 03/07/14: Improved mental status. Sitting in chair. Talking . Trying to work with PT ambulating. But pleasantly confused 03/11/14: Worsening encephalopathy and never got transferred out of ICU. PCCM back on as primary. Remains extubated. Intermittently agitated. Has hydrocephalus per RN 1/9: passed SBT  SUBJECTIVE/OVERNIGHT/INTERVAL HX No new complaints, intermittent  confusion, pulled her PICC line out today.  VITAL SIGNS: Temp:  [97.8 F (36.6 C)-98.7 F (37.1 C)] 98.3 F (36.8 C) (01/11 0730) Resp:  [14-19] 16 (01/11 0600) BP: (82-115)/(47-65) 109/65 mmHg (01/11 0600) Weight:  [55.9 kg (123 lb 3.8 oz)] 55.9 kg (123 lb 3.8 oz) (01/11 0300) Room air  HEMODYNAMICS:   VENTILATOR SETTINGS:   INTAKE / OUTPUT:  Intake/Output Summary (Last 24 hours) at 03/15/14 1001 Last data filed at 03/15/14 0700  Gross per 24 hour  Intake 1569.17 ml  Output   2400 ml  Net -830.83 ml    PHYSICAL EXAMINATION: General: frail female awake and interactive and following commands. Neuro: short term memory def. Moves all ext to command. HEENT: PERRL Cardiovascular:  regular Lungs:  No wheeze,clear  Abdomen:  Soft, non tender Musculoskeletal:  No edema Skin:  No rashes  LABS: PULMONARY No results for input(s): PHART, PCO2ART, PO2ART, HCO3, TCO2, O2SAT in the last 168 hours.  Invalid input(s): PCO2, PO2  CBC  Recent Labs Lab 03/12/14 0500 03/14/14 0345 03/15/14 0300  HGB 12.1 13.0 13.3  HCT 36.9 39.0 39.6  WBC 14.7* 11.6* 9.6  PLT 280 300 284    COAGULATION No results for input(s): INR in the last 168 hours.  CARDIAC    Recent Labs Lab 03/13/14 2116 03/14/14 0345 03/14/14 0916  TROPONINI <0.03 <0.03 <0.03   No results for input(s): PROBNP in the last 168 hours.   CHEMISTRY  Recent Labs Lab 03/09/14 0220 03/10/14 0324 03/11/14 1610 03/12/14 0500 03/13/14 2116 03/14/14 0345 03/15/14 0300  NA 138 140 136 134* 134* 136 134*  K 4.0 4.1 4.3 4.3 3.8 4.1 4.4  CL 107 107 104 104 102 103 102  CO2 24 28 25 24 26 26 26   GLUCOSE 128* 129*  81 98 102* 101* 105*  BUN <5* 7 11 11 8 9 12   CREATININE 0.47* 0.54 0.51 0.58 0.68 0.64 0.63  CALCIUM 7.5* 8.0* 8.2* 7.7* 8.4 8.5 8.8  MG 2.0 2.0 2.1 2.3 2.0  --   --   PHOS 2.4 2.6 3.2 3.7  --   --   --    Estimated Creatinine Clearance: 48.7 mL/min (by C-G formula based on Cr of  0.63).  LIVER  Recent Labs Lab 03/10/14 0324 03/11/14 1610 03/14/14 0345 03/15/14 0300  AST 27 25 20 18   ALT 46* 42* 26 25  ALKPHOS 67 64 69 64  BILITOT 0.4 0.6 0.6 0.4  PROT 5.0* 5.7* 5.4* 5.5*  ALBUMIN 2.9* 3.2* 3.0* 2.9*    INFECTIOUS No results for input(s): LATICACIDVEN, PROCALCITON in the last 168 hours.  ENDOCRINE CBG (last 3)   Recent Labs  03/13/14 0353 03/13/14 0734 03/13/14 1141  GLUCAP 140* 115* 130*    IMAGING x48h No results found. ASSESSMENT / PLAN:  PULMONARY ETT 03/05/14 >> 03/06/14 A:  Acute respiratory failure post procedure. Resolved History of tobacco use.  - s/p successful extubation 03/06/14. Current encephalopathy puts her at risk for intubation P:   Monitor clinically Pulm Hygiene  CARDIOVASCULAR A:  Hx of HLD. P:  Continue zocor BP control as ordered  RENAL A:   Hypokalemia, resolved P:   Follow BMP intermittently   GASTROINTESTINAL A:   Nutrition. Dysphagia-->passed swallow eval 1/9 P:   H2 blockade for SUP Cont diet as tolerated, NPO now for OR visit today for VP shunt.  HEMATOLOGIC A:   DVT prophylaxis. P:  SCD  INFECTIOUS Culture - negative A:   Fever likely was due to [facility]. Afebrile as of 03/12/14 P:   Monitor off abx  Ceftriaxone 12/30 >> 1/01 Ampicillin 12/30 >> 1/01 Acyclovir 12/30 >> 1/01 Vancomycin 12/31 >> 1/01 Cefazolin for surgical prophylaxis 1/11>>>  ENDOCRINE A:   No acute issues. P:   Monitor blood sugar on BMET  NEUROLOGIC A:   Acute encephalopathy 2nd to grade 3 SAH.PCOM aneurysm coiling 03/05/14 Obstructive hydrocephalus   - improved 03/07/14 but is now at vasospasm risk phase and CT shows hydro with ongoing confusion 03/11/14. On 03/12/14: improved after decadron decrease and haldol prn P:   Neuro to place shunt 03/15/14 Nimotop and Zocor for vasospasm prophylaxis to continue per neurosurgery Continue saline infusion  Haldol Prn  (ensure Qtc < 561msec) Wean off decadron - reduced  03/13/2014, will continue until VP shunt is in place, currently on 1 mg IV q12, will need input from NS.  Rush Farmer, M.D. [facility] Medicine. Pager: [phone]. After hours pager: [phone].

## 2014-03-15 NOTE — Transfer of Care (Signed)
Immediate Anesthesia Transfer of Care Note  Patient: Sharon [name]  Procedure(s) Performed: Procedure(s): SHUNT INSERTION VENTRICULAR-PERITONEAL (Right)  Patient Location: PACU  Anesthesia Type:General  Level of Consciousness: awake and alert   Airway & Oxygen Therapy: Patient Spontanous Breathing and Patient connected to nasal cannula oxygen  Post-op Assessment: Report given to PACU RN, Post -op Vital signs reviewed and stable and Patient moving all extremities X 4  Post vital signs: Reviewed and stable  Complications: No apparent anesthesia complications

## 2014-03-15 NOTE — Brief Op Note (Signed)
03/02/2014 - 03/15/2014  6:17 PM  PATIENT:  Sharon [name]  80 y.o. female  PRE-OPERATIVE DIAGNOSIS:  Hydrocephalus  POST-OPERATIVE DIAGNOSIS:  Hydrocephalus  PROCEDURE:  Procedure(s): SHUNT INSERTION VENTRICULAR-PERITONEAL (Right)  SURGEON:  Surgeon(s) and Role:    * Charlie Pitter, MD - Primary  PHYSICIAN ASSISTANT:   ASSISTANTS: none   ANESTHESIA:   general  EBL:  Total I/O In: 1410 [I.V.:1410] Out: 825 [Urine:800; Blood:25]  BLOOD ADMINISTERED:none  DRAINS: none   LOCAL MEDICATIONS USED:  NONE  SPECIMEN:  No Specimen  DISPOSITION OF SPECIMEN:  N/A  COUNTS:  YES  TOURNIQUET:  * No tourniquets in log *  DICTATION: .Dragon Dictation  PLAN OF CARE: Admit to inpatient   PATIENT DISPOSITION:  PACU - hemodynamically stable.   Delay start of Pharmacological VTE agent (>24hrs) due to surgical blood loss or risk of bleeding: yes

## 2014-03-15 NOTE — Progress Notes (Signed)
Called RN at 1425 to see if patient was available for Transcranial Doppler. RN stated that patient was going to OR. Called again at 1600 to see if patient had returned from OR, but patient was still waiting to go. Checked again through EPIC at 1700 and patient was still waiting.  TCD will be performed on 01/14/14.  Landry Mellow, RDMS, RVT 03/15/2014

## 2014-03-15 NOTE — Anesthesia Procedure Notes (Signed)
Procedure Name: Intubation Date/Time: 03/15/2014 5:07 PM Performed by: Ollen Bowl Pre-anesthesia Checklist: Patient identified, Emergency Drugs available, Suction available, Patient being monitored and Timeout performed Patient Re-evaluated:Patient Re-evaluated prior to inductionOxygen Delivery Method: Circle system utilized and Simple face mask Preoxygenation: Pre-oxygenation with 100% oxygen Intubation Type: IV induction Ventilation: Mask ventilation without difficulty Laryngoscope Size: Mac and 3 Grade View: Grade I Tube type: Subglottic suction tube Tube size: 7.5 mm Number of attempts: 1 Airway Equipment and Method: Patient positioned with wedge pillow,  Stylet and LTA kit utilized Placement Confirmation: ETT inserted through vocal cords under direct vision,  positive ETCO2 and breath sounds checked- equal and bilateral Secured at: 22 cm Tube secured with: Tape Dental Injury: Teeth and Oropharynx as per pre-operative assessment

## 2014-03-15 NOTE — Progress Notes (Signed)
Physical Therapy Treatment Patient Details Name: Sharon [name] MRN: [id number] DOB: 10/10/1934 Today's Date: 03/15/2014    History of Present Illness 80 year old female admitted 03/02/14 due to AMS, aneurysmal SAH. PMH significant for DLD. CXR negative, CT indicated nothing acute. 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    PT Comments    Pt with improved cognition today as compared to last week, but continues to require A for attention and awareness.  Pt to go for VP Shunt today and will continue to follow post-op.  Still feel pt appropriate for CIR.    Follow Up Recommendations  CIR     Equipment Recommendations   (TBD)    Recommendations for Other Services       Precautions / Restrictions Precautions Precautions: Fall Restrictions Weight Bearing Restrictions: No    Mobility  Bed Mobility Overal bed mobility: Needs Assistance Bed Mobility: Supine to Sit;Sit to Supine     Supine to sit: Supervision Sit to supine: Supervision   General bed mobility comments: No physical A needed, but cues for safety and attending to task.    Transfers Overall transfer level: Needs assistance Equipment used: 1 person hand held assist Transfers: Sit to/from Stand Sit to Stand: Min assist         General transfer comment: cues for safety and staying on task.    Ambulation/Gait Ambulation/Gait assistance: Min assist Ambulation Distance (Feet): 80 Feet Assistive device: 1 person hand held assist Gait Pattern/deviations: Step-through pattern;Decreased stride length;Narrow base of support     General Gait Details: pt continues to be unsteady and reaches for furniture and rail in hallway.  pt seems unaware of balance deficits.  pt does endorse dizziness with BP monitored during session.      Stairs            Wheelchair Mobility    Modified Rankin (Stroke Patients Only)       Balance Overall balance assessment: Needs assistance Sitting-balance support: No upper extremity supported;Feet supported Sitting balance-Leahy Scale: Fair Sitting balance - Comments: pt utilizes UE support with balance challenges.     Standing balance support: No upper extremity supported;During functional activity Standing balance-Leahy Scale: Poor Standing balance comment: pt either requires UE support or MinA to maintain balance.                      Cognition Arousal/Alertness: Awake/alert Behavior During Therapy: Impulsive Overall Cognitive Status: Impaired/Different from baseline Area of Impairment: Orientation;Attention;Memory;Following commands;Safety/judgement;Awareness;Problem solving Orientation Level: Disoriented to;Place;Time;Situation Current Attention Level: Sustained (for about a minute) Memory: Decreased short-term memory Following Commands: Follows one step commands consistently;Follows multi-step commands inconsistently Safety/Judgement: Decreased awareness of safety;Decreased awareness of deficits Awareness: Intellectual Problem Solving: Slow processing;Difficulty sequencing;Requires verbal cues;Requires tactile cues General Comments: pt with improved attention today and better following directions.  pt still needs cues for impulsiveness and safety.  cues for processing through and sequencing hand washing task.      Exercises      General Comments        Pertinent Vitals/Pain Pain Assessment: Faces Faces Pain Scale: Hurts little more Pain Location: Headache Pain Descriptors / Indicators: Headache Pain Intervention(s): Monitored during session;Repositioned;Patient requesting pain meds-RN notified    Home Living  Prior Function            PT Goals (current goals can now be found in the care plan section) Acute  Rehab PT Goals PT Goal Formulation: With patient Time For Goal Achievement: 03/21/14 Potential to Achieve Goals: Good Progress towards PT goals: Progressing toward goals    Frequency  Min 3X/week    PT Plan Current plan remains appropriate    Co-evaluation             End of Session Equipment Utilized During Treatment: Gait belt Activity Tolerance: Patient tolerated treatment well Patient left: in bed;with call bell/phone within reach;with bed alarm set;with nursing/sitter in room;with family/visitor present     Time: 1610-9604 PT Time Calculation (min) (ACUTE ONLY): 24 min  Charges:  $Therapeutic Activity: 23-37 mins                    G CodesCatarina Hartshorn, [place] [phone] 03/15/2014, 10:10 AM

## 2014-03-15 NOTE — Anesthesia Postprocedure Evaluation (Signed)
Anesthesia Post Note  Patient: Sharon [name]  Procedure(s) Performed: Procedure(s) (LRB): SHUNT INSERTION VENTRICULAR-PERITONEAL (Right)  Anesthesia type: General  Patient location: PACU  Post pain: Pain level controlled and Adequate analgesia  Post assessment: Post-op Vital signs reviewed, Patient's Cardiovascular Status Stable, Respiratory Function Stable, Patent Airway and Pain level controlled  Last Vitals:  Filed Vitals:   03/15/14 1915  BP: 121/96  Pulse:   Temp:   Resp:     Post vital signs: Reviewed and stable  Level of consciousness: awake, alert  and oriented  Complications: No apparent anesthesia complications

## 2014-03-15 NOTE — Progress Notes (Signed)
Called to speak with Neurosurgery MD about pt not being awake/coherent enough to administer nimotop at this time. Dr. Annette Stable gave permission to hold the nimotop until pt is able to tolerate PO's. Also gave permission for IV pain medicine order, verbal order given by telephone and entered into the [facility]. See MAR for details.   Sharon [name]

## 2014-03-15 NOTE — Progress Notes (Signed)
eLink Physician-Brief Progress Note Patient Name: Sharon [name] DOB: 10/06/1934 MRN: [id number]   Date of Service  03/15/2014  HPI/Events of Note  -despite sedation, pt still pulling at IVs  eICU Interventions  Bilateral wrist restraints ordered      Intervention Category Major Interventions: Delirium, psychosis, severe agitation - evaluation and management  Asencion Noble 03/15/2014, 8:30 PM

## 2014-03-15 NOTE — Op Note (Signed)
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  Asst. Surgeon: None  Anesthesia: General  Indication: 80 year old female status post subarachnoid hemorrhage secondary to right posterior communicating artery segment aneurysm presents now with communicating hydrocephalus with resultant confusion and incontinence. Patient has benefited from therapeutic drainage of CSF and presents now for right occipital VP shunt in in hopes of improving her symptoms.  Operative note: After induction of anesthesia, patient positioned supine with neck slightly extended and turned toward the left. Patient's right occipital region neck chest and abdomen prepped and draped sterilely. Incision was made in the right occipital region as well as the epigastric region. Starting first in the occipital region and a subcutaneous pocket was made for later placement of a shunt valve. Then working through the abdominal wound the Camper's and Scarpa's fascia were divided in the anterior rectus sheath was divided. Rectus: Muscle fibers were divided in the posterior rectus sheath was elevated and incised. Intravenous peroneal cavity was performed. Shunt passer was then passed from the cranial wound to the abdominal wound. A shunt was then passed from the cranial wound to the abdominal wound through the shunt passer using a [employer] strata valve set at 1.0 with an integral distal catheter. A right occipital burr hole was made. Dura was coagulated and incised. A 9 cm catheter was then introduced into the lateral ventricle with some difficulty requiring a number of passes. CSF under pressure was returned without evidence of hemorrhage. CSF catheter was then attached to the proximal aspect of the shunt. Good flow of CSF  was noted through the distal catheter. Distal catheter was then passed into the peroneal cavity without difficulty. Both wounds were irrigated and then closed in a typical fashion. Sterile dressing were applied. No apparent Complications. The patient tolerated procedure well and returns to the recovery room postop.

## 2014-03-15 NOTE — Anesthesia Preprocedure Evaluation (Addendum)
Anesthesia Evaluation  Patient identified by MRN, date of birth, ID band Patient awake    Reviewed: Allergy & Precautions, NPO status , Patient's Chart, lab work & pertinent test results  Airway Mallampati: II  TM Distance: >3 FB Neck ROM: Full    Dental  (+) Missing, Partial Lower   Pulmonary Current Smoker,          Cardiovascular + Peripheral Vascular Disease     Neuro/Psych  Headaches,    GI/Hepatic   Endo/Other    Renal/GU      Musculoskeletal  (+) Arthritis -,   Abdominal   Peds  Hematology   Anesthesia Other Findings Hydrocephalus  Reproductive/Obstetrics                          Anesthesia Physical Anesthesia Plan  ASA: II  Anesthesia Plan: General   Post-op Pain Management:    Induction: Intravenous  Airway Management Planned: Oral ETT  Additional Equipment:   Intra-op Plan:   Post-operative Plan: Extubation in OR  Informed Consent: I have reviewed the patients History and Physical, chart, labs and discussed the procedure including the risks, benefits and alternatives for the proposed anesthesia with the patient or authorized representative who has indicated his/her understanding and acceptance.     Plan Discussed with: CRNA, Anesthesiologist and Surgeon  Anesthesia Plan Comments:         Anesthesia Quick Evaluation

## 2014-03-15 NOTE — Progress Notes (Signed)
PICC line dressing removed by patient while patient's daughter was sitting at bedside. Line pulled out approximately 10 cm. Fluids stopped and bio-patch replaced. New dressing placed according to procedure.

## 2014-03-15 NOTE — Plan of Care (Signed)
Problem: Phase I Progression Outcomes Goal: Tolerating diet Outcome: Completed/Met Date Met:  03/15/14 Regular diet

## 2014-03-16 ENCOUNTER — Encounter (HOSPITAL_COMMUNITY): Payer: Self-pay | Admitting: Neurosurgery

## 2014-03-16 ENCOUNTER — Inpatient Hospital Stay (HOSPITAL_COMMUNITY): Payer: Medicare Other

## 2014-03-16 LAB — BASIC METABOLIC PANEL
ANION GAP: 2 — AB (ref 5–15)
BUN: 11 mg/dL (ref 6–23)
CHLORIDE: 104 meq/L (ref 96–112)
CO2: 25 mmol/L (ref 19–32)
Calcium: 7.1 mg/dL — ABNORMAL LOW (ref 8.4–10.5)
Creatinine, Ser: 0.64 mg/dL (ref 0.50–1.10)
GFR calc Af Amer: >90 mL/min (ref >90)
GFR, EST NON AFRICAN AMERICAN: 84 mL/min — AB (ref >90)
Glucose, Bld: 110 mg/dL — ABNORMAL HIGH (ref 70–99)
POTASSIUM: 4.1 mmol/L (ref 3.5–5.1)
SODIUM: 131 mmol/L — AB (ref 135–145)

## 2014-03-16 LAB — CBC
HCT: 38.3 % (ref 36.0–46.0)
Hemoglobin: 12.5 g/dL (ref 12.0–15.0)
MCH: 32.1 pg (ref 26.0–34.0)
MCHC: 32.6 g/dL (ref 30.0–36.0)
MCV: 98.5 fL (ref 78.0–100.0)
PLATELETS: 238 10*3/uL (ref 150–400)
RBC: 3.89 MIL/uL (ref 3.87–5.11)
RDW: 13.9 % (ref 11.5–15.5)
WBC: 14.8 10*3/uL — AB (ref 4.0–10.5)

## 2014-03-16 LAB — PHOSPHORUS: Phosphorus: 3 mg/dL (ref 2.3–4.6)

## 2014-03-16 LAB — MAGNESIUM: Magnesium: 2 mg/dL (ref 1.5–2.5)

## 2014-03-16 NOTE — Progress Notes (Signed)
PULMONARY / CRITICAL CARE MEDICINE   Name: AN LANNAN MRN: [id number] DOB: 01/15/1935    ADMISSION DATE:  03/02/2014 CONSULTATION DATE:  03/05/14  REFERRING MD :  Dr. Garen Grams  REASON FOR CONSULTATION: Ventilator-dependent respiratory failure  INITIAL PRESENTATION:  80 year old woman, active smoker with a history of arthritis. She began to have headache and possibly some associated weakness dating back to the end of November. Since that time she had experienced some increased somnolence and off-and-on headache. Then 12/28 she was noted to be disoriented. She was admitted 12/29 for neurological evaluation. Lumbar puncture was consistent with subarachnoid bleeding. 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 She underwent coiling of the aneurysm on 03/05/14. Post procedure she was admitted to the ICU on mechanical ventilation.   STUDIES:  Head CT 12/29 >> indeterminate punctate calcification in the medial aspect of the right middle cranial fossa, possibly small meningioma LP 12/31 > xanthochromic, 7.8K RBC, 158 WBC (41 neutrophils). Glucose 36. Total protein 84. Gram stain negative CSF HSV 12/31 >>  Brain MRI 12/31 >> focal diffusion signal in the right sylvian fissure, right posterior indicating arterial elongated aneurysm, dilation of the lateral ventricles but no evidence of hydrocephalus 03/05/14 >> coiling of the right posterior communicating artery aneurysm. Also noted on angiography was a lft internal carotid artery aneurysm 03/06/14 - extubated 03/07/14: Improved mental status. Sitting in chair. Talking . Trying to work with PT ambulating. But pleasantly confused 03/11/14: Worsening encephalopathy and never got transferred out of ICU. PCCM back on as primary. Remains extubated. Intermittently agitated. Has hydrocephalus per RN 1/9: passed SBT  SUBJECTIVE/OVERNIGHT/INTERVAL HX No new complaints, post op 1/11 for  right occipital VP shunt placement.  VITAL SIGNS: Temp:  [96.2 F (35.7 C)-98 F (36.7 C)] 98 F (36.7 C) (01/12 0744) Pulse Rate:  [74-100] 74 (01/11 2000) Resp:  [6-29] 13 (01/12 0800) BP: (74-158)/(39-96) 138/60 mmHg (01/12 0755) SpO2:  [81 %-100 %] 98 % (01/12 0800) Weight:  [53.5 kg (117 lb 15.1 oz)] 53.5 kg (117 lb 15.1 oz) (01/12 0600) Room air   HEMODYNAMICS:   VENTILATOR SETTINGS:   INTAKE / OUTPUT:  Intake/Output Summary (Last 24 hours) at 03/16/14 0848 Last data filed at 03/16/14 0800  Gross per 24 hour  Intake 2219.17 ml  Output    825 ml  Net 1394.17 ml    PHYSICAL EXAMINATION: General: frail female lethargic this AM but wakes up and follows some commands. Neuro: Lethargic but arousable and follows commands. HEENT: PERRL, EOM-I and MMM, post op for VP shunt placement. Cardiovascular:  RRR, Nl S1/S2, -M/R/G. Lungs:  No wheeze,clear  Abdomen:  Soft, non tender Musculoskeletal:  No edema Skin:  No rashes  LABS: PULMONARY No results for input(s): PHART, PCO2ART, PO2ART, HCO3, TCO2, O2SAT in the last 168 hours.  Invalid input(s): PCO2, PO2  CBC  Recent Labs Lab 03/14/14 0345 03/15/14 0300 03/16/14 0800  HGB 13.0 13.3 12.5  HCT 39.0 39.6 38.3  WBC 11.6* 9.6 14.8*  PLT 300 284 238   COAGULATION No results for input(s): INR in the last 168 hours.  CARDIAC   Recent Labs Lab 03/13/14 2116 03/14/14 0345 03/14/14 0916  TROPONINI <0.03 <0.03 <0.03   No results for input(s): PROBNP in the last 168 hours.  CHEMISTRY  Recent Labs Lab 03/10/14 0324 03/11/14 1610 03/12/14 0500 03/13/14 2116 03/14/14 0345 03/15/14 0300  NA 140 136 134* 134* 136 134*  K 4.1 4.3 4.3  3.8 4.1 4.4  CL 107 104 104 102 103 102  CO2 28 25 24 26 26 26   GLUCOSE 129* 81 98 102* 101* 105*  BUN 7 11 11 8 9 12   CREATININE 0.54 0.51 0.58 0.68 0.64 0.63  CALCIUM 8.0* 8.2* 7.7* 8.4 8.5 8.8  MG 2.0 2.1 2.3 2.0  --   --   PHOS 2.6 3.2 3.7  --   --   --    Estimated  Creatinine Clearance: 48.7 mL/min (by C-G formula based on Cr of 0.63).  LIVER  Recent Labs Lab 03/10/14 0324 03/11/14 1610 03/14/14 0345 03/15/14 0300  AST 27 25 20 18   ALT 46* 42* 26 25  ALKPHOS 67 64 69 64  BILITOT 0.4 0.6 0.6 0.4  PROT 5.0* 5.7* 5.4* 5.5*  ALBUMIN 2.9* 3.2* 3.0* 2.9*    INFECTIOUS No results for input(s): LATICACIDVEN, PROCALCITON in the last 168 hours.  ENDOCRINE CBG (last 3)   Recent Labs  03/13/14 1141  GLUCAP 130*    IMAGING x48h Ir Fluoro Guide Cv Line Right  03/15/2014   CLINICAL DATA:  80 year old female requiring IV access long-term. A recent PICC has been withdrawn and requires replacement.  EXAM: PICC PLACEMENT WITH ULTRASOUND AND FLUOROSCOPY  FLUOROSCOPY TIME:  Eighteen seconds  TECHNIQUE: After written informed consent was obtained, patient was placed in the supine position on angiographic table.  A scout image of the chest confirms that the tip of the PICC is located in the right subclavian vein.  Region was prepped using maximum barrier technique including cap and mask, sterile gown, sterile gloves, large sterile sheet, and Chlorhexidine as cutaneous antisepsis. The region was infiltrated locally with 1% lidocaine.  Under fluoroscopic guidance, the PICC was withdrawn further to the area of the axillary vein and the catheter was cut at the skin site. A 0.018 guidewire was advanced through the PICC new of and the catheter was removed over the guidewire. A new peel-away sheath was placed through the vein access. The wire was used to measure the new length of the catheter.  A 5-French double-lumen power injectable PICC trimmed to 36cm was advanced, positioned with its tip near the cavoatrial junction. Spot chest radiograph confirms appropriate catheter position. Catheter was flushed per protocol and secured externally with 0-Prolene sutures. The patient tolerated procedure well, with no immediate complication.  COMPLICATIONS: None  IMPRESSION: Status  post fluoroscopic exchange of right upper extremity PICC for a new 36 cm, power injectable, double-lumen PICC. Catheter ready for use.  Signed,  Dulcy Fanny. Earleen Newport, DO  [facility]  [facility]   Electronically Signed   By: Corrie Mckusick D.O.   On: 03/15/2014 12:23   ASSESSMENT / PLAN:  PULMONARY ETT 03/05/14 >> 03/06/14 A:  Acute respiratory failure post procedure. Resolved History of tobacco use.  - s/p successful extubation 03/06/14. Current encephalopathy puts her at risk for intubation P:   Monitor clinically Pulm Hygiene Ambulate  CARDIOVASCULAR A:  Hx of HLD. P:  Continue zocor BP control as ordered Nimotop  RENAL A:   Hypokalemia, resolved P:   Follow BMP in AM. Replace electrolytes as indicated.  GASTROINTESTINAL A:   Nutrition. Dysphagia-->passed swallow eval 1/9 P:   H2 blockade for SUP Dysphagia 3 diet.Marland Kitchen  HEMATOLOGIC A:   DVT prophylaxis. P:  SCD.  INFECTIOUS Culture - negative A:   Fever likely was due to [facility]. Afebrile as of 03/12/14 P:   Monitor off abx  Ceftriaxone 12/30 >>  1/01 Ampicillin 12/30 >> 1/01 Acyclovir 12/30 >> 1/01 Vancomycin 12/31 >> 1/01 Cefazolin for surgical prophylaxis 1/11>>>  ENDOCRINE A:   No acute issues. P:   Monitor blood sugar on BMET  NEUROLOGIC A:   Acute encephalopathy 2nd to grade 3 SAH.PCOM aneurysm coiling 03/05/14 Obstructive hydrocephalus   - improved 03/07/14 but is now at vasospasm risk phase and CT shows hydro with ongoing confusion 03/11/14. On 03/12/14: improved after decadron decrease and haldol prn P:   Neuro placed shunt 03/15/14. Nimotop and Zocor for vasospasm prophylaxis to continue per neurosurgery Continue saline infusion, if able to take PO then will d/c IVF in AM. Haldol Prn  (ensure Qtc < 537msec) Wean off decadron - reduced 03/13/2014, will continue until VP shunt is in place, currently on 1 mg IV q12, will need input from NS.  Hold in the ICU today,  begin to ambulate and PT today, restart diet, need to address steroids but would like to discuss with Dr. Trenton Gammon first.  Rush Farmer, M.D. [facility] Pulmonary/Critical Care Medicine. Pager: [phone]. After hours pager: [phone].

## 2014-03-16 NOTE — Progress Notes (Signed)
UR completed.  Tomoki Lucken, RN BSN MHA CCM Trauma/Neuro ICU Case Manager [phone]  

## 2014-03-16 NOTE — Progress Notes (Signed)
Patient has remained lethargic/difficult to arouse since shunt surgery yesterday. She will awaken to loud voice or noxious stimuli. She will answer a few words and then she dress back off to sleep. She moves all 4 extremities but tone appears to be increased in her lower extremities.  Neurologic worsening status post VP shunt placement. Check follow-up head CT scan today.

## 2014-03-16 NOTE — Progress Notes (Signed)
Met with patients daughter Kim this morning.  She was very upset that her mother was put in restraints last night.  She stated that she left the hospital at 830pm last night and her mom was fine.  She called last night at 11pm and her mom was resting however clinically her mother has periods of confusion where she will try to get out of the bed.  She was upset that there was not a sitter available to come sit with her mom instead of putting her mom in restraints.  I explained to her that a sitter is not guaranteed as they cover the needs in the house and not just our unit.  Daughter did not want mom in restraints and said she would rather have a sitter.  Told her I would pass along to staff that if we do not have a sitter we would call her to come and sit with her mom.  A sitter was sent at 3pm today and daughter got irate.  She said she did not want a sitter if she was there.  I explained to her that staffing reassigns every for hours and if she wanted the sitter to come back at 7pm so she can get dinner or some rest.  She said no she is not leaving her room until her mom leaves the hospital.  She will have her family come and sit with her mom (the patient) if she has to step away. She does not trust that we won't put her mom back in restraints.  I explained to her that restraints are for patient safety as her mom has taken her IV out, tried to go to the bathroom by herself and tries to walk around alone.  I told her that we have a different type of restraint as the posey belt and mittens that aren't considered restraints however she started yelling saying she does not want any of that.  I explained to her that her mom just had brain surgery last night and her safety is priority as we do not want her to fall.  She started yelling again and was very unreasonable. She got angry and started yelling saying she does not want restraints or a sitter at all.  The sitter will be discontinued through staffing. 

## 2014-03-17 LAB — CBC
HCT: 34.4 % — ABNORMAL LOW (ref 36.0–46.0)
Hemoglobin: 11.7 g/dL — ABNORMAL LOW (ref 12.0–15.0)
MCH: 32.6 pg (ref 26.0–34.0)
MCHC: 34 g/dL (ref 30.0–36.0)
MCV: 95.8 fL (ref 78.0–100.0)
PLATELETS: 224 10*3/uL (ref 150–400)
RBC: 3.59 MIL/uL — AB (ref 3.87–5.11)
RDW: 13.5 % (ref 11.5–15.5)
WBC: 10.5 10*3/uL (ref 4.0–10.5)

## 2014-03-17 LAB — MAGNESIUM: Magnesium: 2.1 mg/dL (ref 1.5–2.5)

## 2014-03-17 LAB — BASIC METABOLIC PANEL
Anion gap: 4 — ABNORMAL LOW (ref 5–15)
BUN: 8 mg/dL (ref 6–23)
CALCIUM: 7.5 mg/dL — AB (ref 8.4–10.5)
CO2: 25 mmol/L (ref 19–32)
Chloride: 102 mEq/L (ref 96–112)
Creatinine, Ser: 0.58 mg/dL (ref 0.50–1.10)
GFR calc Af Amer: >90 mL/min (ref >90)
GFR, EST NON AFRICAN AMERICAN: 87 mL/min — AB (ref >90)
GLUCOSE: 96 mg/dL (ref 70–99)
Potassium: 4.1 mmol/L (ref 3.5–5.1)
SODIUM: 131 mmol/L — AB (ref 135–145)

## 2014-03-17 LAB — CULTURE, BLOOD (ROUTINE X 2): Culture: NO GROWTH

## 2014-03-17 LAB — PHOSPHORUS: Phosphorus: 2.2 mg/dL — ABNORMAL LOW (ref 2.3–4.6)

## 2014-03-17 MED ORDER — ENSURE COMPLETE PO LIQD
237.0000 mL | Freq: Two times a day (BID) | ORAL | Status: DC
  Administered 2014-03-18 (×2): 237 mL via ORAL

## 2014-03-17 MED ORDER — SODIUM CHLORIDE 0.9 % IV BOLUS (SEPSIS): 500.0000 mL | Freq: Once | INTRAVENOUS | Status: DC

## 2014-03-17 NOTE — Progress Notes (Signed)
Reiterated with family (daughter Maudie Mercury and significant other, Ole) the concern for patient's safety and need for careful watching.  Both denied use of our hospital sitter as of 11/12 am and refuses restraints.  Michela Pitcher they will ensure her safety.  Both were told of concern for falls, removal of PICC line, and impulsive activity and agreed to take responsibility for watching her when we are out of the room.  I ensured them I would check in a number of times an hour and be within quick reach if needed. Per protocol, bed alarm is on.  Patient is resting calmly at this time. [place], [place]

## 2014-03-17 NOTE — Progress Notes (Signed)
NUTRITION FOLLOW-UP  INTERVENTION:  Ensure Complete po BID, each supplement provides 350 kcal and 13 grams of protein  Magic cup TID between meals, each supplement provides 290 kcal and 9 grams of protein  NUTRITION DIAGNOSIS: Inadequate oral intake related to confusion, decreased appetite as evidenced by meal completion of <50%; ongoing.   Goal: Pt to meet >/= 90% of their estimated nutrition needs, not met.   Monitor:  PO intake, weight trends, labs, I/O's  ASSESSMENT: Pt admitted with SAH, s/p coiling 1/1.  Pt passed swallow evaluation 1/9 and started on diet. PO intake has remained poor since then, usually <50% of her meal due to lethargy.  Shunt placed 1/11.   Diarrhea resolved per RN.  Family at bedside and reports that pt ate her oatmeal only this am.   Height: Ht Readings from Last 1 Encounters:  03/05/14 5' 3"  (1.6 m)    Weight: Wt Readings from Last 1 Encounters:  03/17/14 119 lb 7.8 oz (54.2 kg)  Admission weight: 121 lb 12/29  Estimated Nutritional Needs: Kcal: 1650-1850 Protein: 65-75 grams Fluid: 1.65- 1.85 L/day  Skin: no issues noted  Diet Order: DIET DYS 3   Intake/Output Summary (Last 24 hours) at 03/17/14 1256 Last data filed at 03/17/14 1200  Gross per 24 hour  Intake   1310 ml  Output      0 ml  Net   1310 ml    Last BM: 1/12 - soft  Labs:   Recent Labs Lab 03/12/14 0500 03/13/14 2116  03/15/14 0300 03/16/14 0800 03/17/14 0520  NA 134* 134*  < > 134* 131* 131*  K 4.3 3.8  < > 4.4 4.1 4.1  CL 104 102  < > 102 104 102  CO2 24 26  < > 26 25 25   BUN 11 8  < > 12 11 8   CREATININE 0.58 0.68  < > 0.63 0.64 0.58  CALCIUM 7.7* 8.4  < > 8.8 7.1* 7.5*  MG 2.3 2.0  --   --  2.0 2.1  PHOS 3.7  --   --   --  3.0 2.2*  GLUCOSE 98 102*  < > 105* 110* 96  < > = values in this interval not displayed.  CBG (last 3)  No results for input(s): GLUCAP in the last 72 hours.  Scheduled Meds: . dexamethasone  1 mg Intravenous Q12H  .  famotidine  20 mg Oral Daily  . niMODipine  60 mg Oral 6 times per day  . simvastatin  20 mg Oral q1800  . sodium chloride  10-40 mL Intracatheter Q12H    Continuous Infusions: . sodium chloride 50 mL/hr at 03/17/14 [place], LDN, CNSC [phone] Pager [phone] After Hours Pager

## 2014-03-17 NOTE — Progress Notes (Signed)
PULMONARY / CRITICAL CARE MEDICINE   Name: Sharon [name] MRN: [id number] DOB: 02-27-1935    ADMISSION DATE:  03/02/2014 CONSULTATION DATE:  03/05/14  REFERRING MD :  Dr. Garen Grams  REASON FOR CONSULTATION: Ventilator-dependent respiratory failure  INITIAL PRESENTATION:  80 year old woman, active smoker with a history of arthritis. She began to have headache and possibly some associated weakness dating back to the end of November. Since that time she had experienced some increased somnolence and off-and-on headache. Then 12/28 she was noted to be disoriented. She was admitted 12/29 for neurological evaluation. Lumbar puncture was consistent with subarachnoid bleeding. 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 She underwent coiling of the aneurysm on 03/05/14. Post procedure she was admitted to the ICU on mechanical ventilation.   STUDIES:  Head CT 12/29 >> indeterminate punctate calcification in the medial aspect of the right middle cranial fossa, possibly small meningioma LP 12/31 > xanthochromic, 7.8K RBC, 158 WBC (41 neutrophils). Glucose 36. Total protein 84. Gram stain negative CSF HSV 12/31 >>  Brain MRI 12/31 >> focal diffusion signal in the right sylvian fissure, right posterior indicating arterial elongated aneurysm, dilation of the lateral ventricles but no evidence of hydrocephalus 03/05/14 >> coiling of the right posterior communicating artery aneurysm. Also noted on angiography was a lft internal carotid artery aneurysm 03/06/14 - extubated 03/07/14: Improved mental status. Sitting in chair. Talking . Trying to work with PT ambulating. But pleasantly confused 03/11/14: Worsening encephalopathy and never got transferred out of ICU. PCCM back on as primary. Remains extubated. Intermittently agitated. Has hydrocephalus per RN 1/9: passed SBT  SUBJECTIVE/OVERNIGHT/INTERVAL HX No new complaints, post op 1/11 for  right occipital VP shunt placement.  VITAL SIGNS: Temp:  [97.3 F (36.3 C)-98.8 F (37.1 C)] 97.3 F (36.3 C) (01/13 0800) Resp:  [9-23] 20 (01/13 0700) BP: (85-125)/(39-101) 110/50 mmHg (01/13 0700) SpO2:  [89 %-100 %] 96 % (01/13 0700) Weight:  [54.2 kg (119 lb 7.8 oz)] 54.2 kg (119 lb 7.8 oz) (01/13 0500) Room air   HEMODYNAMICS:   VENTILATOR SETTINGS:   INTAKE / OUTPUT:  Intake/Output Summary (Last 24 hours) at 03/17/14 0840 Last data filed at 03/17/14 0700  Gross per 24 hour  Intake   1250 ml  Output      0 ml  Net   1250 ml    PHYSICAL EXAMINATION: General: frail female lethargic this AM but wakes up and follows some commands. Neuro: Lethargic but arousable and follows commands. HEENT: PERRL, EOM-I and MMM, post op for VP shunt placement. Cardiovascular:  RRR, Nl S1/S2, -M/R/G. Lungs:  No wheeze,clear  Abdomen:  Soft, non tender Musculoskeletal:  No edema Skin:  No rashes  LABS: PULMONARY No results for input(s): PHART, PCO2ART, PO2ART, HCO3, TCO2, O2SAT in the last 168 hours.  Invalid input(s): PCO2, PO2  CBC  Recent Labs Lab 03/15/14 0300 03/16/14 0800 03/17/14 0520  HGB 13.3 12.5 11.7*  HCT 39.6 38.3 34.4*  WBC 9.6 14.8* 10.5  PLT 284 238 224   COAGULATION No results for input(s): INR in the last 168 hours.  CARDIAC    Recent Labs Lab 03/13/14 2116 03/14/14 0345 03/14/14 0916  TROPONINI <0.03 <0.03 <0.03   No results for input(s): PROBNP in the last 168 hours.  CHEMISTRY  Recent Labs Lab 03/11/14 1610 03/12/14 0500 03/13/14 2116 03/14/14 0345 03/15/14 0300 03/16/14 0800 03/17/14 0520  NA 136 134* 134* 136 134* 131* 131*  K  4.3 4.3 3.8 4.1 4.4 4.1 4.1  CL 104 104 102 103 102 104 102  CO2 25 24 26 26 26 25 25   GLUCOSE 81 98 102* 101* 105* 110* 96  BUN 11 11 8 9 12 11 8   CREATININE 0.51 0.58 0.68 0.64 0.63 0.64 0.58  CALCIUM 8.2* 7.7* 8.4 8.5 8.8 7.1* 7.5*  MG 2.1 2.3 2.0  --   --  2.0 2.1  PHOS 3.2 3.7  --   --   --  3.0  2.2*   Estimated Creatinine Clearance: 48.7 mL/min (by C-G formula based on Cr of 0.58).  LIVER  Recent Labs Lab 03/11/14 1610 03/14/14 0345 03/15/14 0300  AST 25 20 18   ALT 42* 26 25  ALKPHOS 64 69 64  BILITOT 0.6 0.6 0.4  PROT 5.7* 5.4* 5.5*  ALBUMIN 3.2* 3.0* 2.9*    INFECTIOUS No results for input(s): LATICACIDVEN, PROCALCITON in the last 168 hours.  ENDOCRINE CBG (last 3)  No results for input(s): GLUCAP in the last 72 hours.  IMAGING x48h Ct Head Wo Contrast  03/16/2014   CLINICAL DATA:  Hydrocephalus. Altered mental status. Status post shunt placement recent coiling of right posterior communicating region aneurysm.  EXAM: CT HEAD WITHOUT CONTRAST  TECHNIQUE: Contiguous axial images were obtained from the base of the skull through the vertex without intravenous contrast.  COMPARISON:  03/10/2014  FINDINGS: Embolization coils are again seen at the site of previously treated right posterior communicating region aneurysm with associated streak artifact. Right parietal approach ventriculostomy catheter has been placed and courses in or adjacent to the inferior atrium of the right lateral ventricle, traverses the third ventricle, and terminates in the region of the left ventral thalamus. A small amount of pneumocephalus is present with gas noted in the right atrium and left temporal horn. The ventricles are smaller in size than on the prior CT.  Trace residual subarachnoid hemorrhage is again seen in the sylvian fissures, less conspicuous than on the prior study. There is no evidence of acute large territory cortical infarct, new intracranial hemorrhage, mass, midline shift, or extra-axial collection. Small amount of periventricular white matter hypoattenuation is similar to the prior study and nonspecific but may reflect mild chronic small vessel ischemic disease.  Prior bilateral cataract extraction is noted. Visualized mastoid air cells are clear. Visualized paranasal sinuses are  clear.  IMPRESSION: 1. Interval ventriculostomy catheter placement as above. Decreased size of the ventricles. 2. Trace residual subarachnoid hemorrhage, decreased from prior.   Electronically Signed   By: Logan Bores   On: 03/16/2014 15:28   Ir Fluoro Guide Cv Line Right  03/15/2014   CLINICAL DATA:  80 year old female requiring IV access long-term. A recent PICC has been withdrawn and requires replacement.  EXAM: PICC PLACEMENT WITH ULTRASOUND AND FLUOROSCOPY  FLUOROSCOPY TIME:  Eighteen seconds  TECHNIQUE: After written informed consent was obtained, patient was placed in the supine position on angiographic table.  A scout image of the chest confirms that the tip of the PICC is located in the right subclavian vein.  Region was prepped using maximum barrier technique including cap and mask, sterile gown, sterile gloves, large sterile sheet, and Chlorhexidine as cutaneous antisepsis. The region was infiltrated locally with 1% lidocaine.  Under fluoroscopic guidance, the PICC was withdrawn further to the area of the axillary vein and the catheter was cut at the skin site. A 0.018 guidewire was advanced through the PICC new of and the catheter was removed over the guidewire.  A new peel-away sheath was placed through the vein access. The wire was used to measure the new length of the catheter.  A 5-French double-lumen power injectable PICC trimmed to 36cm was advanced, positioned with its tip near the cavoatrial junction. Spot chest radiograph confirms appropriate catheter position. Catheter was flushed per protocol and secured externally with 0-Prolene sutures. The patient tolerated procedure well, with no immediate complication.  COMPLICATIONS: None  IMPRESSION: Status post fluoroscopic exchange of right upper extremity PICC for a new 36 cm, power injectable, double-lumen PICC. Catheter ready for use.  Signed,  Dulcy Fanny. Earleen Newport, DO  [facility]  [facility]    Electronically Signed   By: Corrie Mckusick D.O.   On: 03/15/2014 12:23   ASSESSMENT / PLAN:  PULMONARY ETT 03/05/14 >> 03/06/14 A:  Acute respiratory failure post procedure. Resolved History of tobacco use.  - s/p successful extubation 03/06/14. Current encephalopathy puts her at risk for intubation P:   Monitor clinically. Pulm Hygiene. Ambulate as able.  CARDIOVASCULAR A:  Hx of HLD. P:  Continue zocor. BP control as ordered. Nimotop.  RENAL A:   Hypokalemia, resolved P:   Follow BMP in AM. Replace electrolytes as indicated.  GASTROINTESTINAL A:   Nutrition. Dysphagia-->passed swallow eval 1/9 P:   H2 blockade for SUP Dysphagia 3 diet.  HEMATOLOGIC A:   DVT prophylaxis. P:  SCD.  INFECTIOUS Culture - negative A:   Fever likely was due to [facility]. Afebrile as of 03/12/14 P:   Monitor off abx  Ceftriaxone 12/30 >> 1/01 Ampicillin 12/30 >> 1/01 Acyclovir 12/30 >> 1/01 Vancomycin 12/31 >> 1/01 Cefazolin for surgical prophylaxis 1/11>>>  ENDOCRINE A:   No acute issues. P:   Monitor blood sugar on BMET  NEUROLOGIC A:   Acute encephalopathy 2nd to grade 3 SAH.PCOM aneurysm coiling 03/05/14 Obstructive hydrocephalus   - improved 03/07/14 but is now at vasospasm risk phase and CT shows hydro with ongoing confusion 03/11/14. On 03/12/14: improved after decadron decrease and haldol prn P:   Neuro placed shunt 03/15/14. Nimotop and Zocor for vasospasm prophylaxis to continue per neurosurgery KVO IVF. D/C haldol. Wean off decadron - reduced 03/17/2014, to 0.5 mg IV q12 x3 days then switch to prednisone 10 mg PO daily after that.  Hold in ICU until mental status is better.  Rush Farmer, M.D. [facility] Pulmonary/Critical Care Medicine. Pager: [phone]. After hours pager: [phone].

## 2014-03-17 NOTE — Progress Notes (Signed)
The patient looks better today. She is still somnolent but will awaken and answer questions. She complains of some headache. She is oriented to person and place. She is certainly much less restless and confused than preprocedure.  She is afebrile. Her vitals are stable. She is somnolent but will awaken. Her speech is fluent but sparse. She follows commands with all 4 extremities. There is some increased tone present bilaterally. Wounds are clean and dry. Abdomen is soft.  Follow-up head CT scan demonstrates good decompression of her lateral ventricles. Certainly her ventriculostomy catheter is not optimally placed but appears to be functioning and I am hesitant to remove it or reposition it given the difficulty getting in and in the first place.  Progressing slowly. Stop Haldol and other sedating agents. Work on mobilizing with therapy.

## 2014-03-17 NOTE — Progress Notes (Signed)
Physical Therapy Treatment Patient Details Name: Sharon [name] MRN: [id number] DOB: Sep 04, 1934 Today's Date: 03/17/2014    History of Present Illness 80 year old female admitted 03/02/14 due to AMS, aneurysmal SAH. PMH significant for DLD. CXR negative, CT indicated nothing acute. 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    PT Comments    Pt more lethargic today than previous sessions, but does follow some directions when provided with cues for arousal first.  Pt seems to fatigue more quickly during mobility and will attempt to sit without warning.  Pt maintaining eye closed throughout most of session, though will open on command, just not maintain.  Continue to feel pt would benefit from CIR at D/C to maximize independence.  Pt would benefit from OT consult.    Follow Up Recommendations  CIR     Equipment Recommendations   (TBD)    Recommendations for Other Services       Precautions / Restrictions Precautions Precautions: Fall Restrictions Weight Bearing Restrictions: No    Mobility  Bed Mobility Overal bed mobility: Needs Assistance Bed Mobility: Supine to Sit     Supine to sit: Min guard     General bed mobility comments: No physical A needed, but A for management of lines and bed linens as pt not maintaining eyes open.    Transfers Overall transfer level: Needs assistance Equipment used: 2 person hand held assist Transfers: Sit to/from [facility] Sit to Stand: Min assist;+2 physical assistance Stand pivot transfers: Min assist;+2 physical assistance       General transfer comment: Repeated coming to stand x3 for changing brief and peri hygiene.  pt seems to fatigue quickly and attempts to sit without warning.    Ambulation/Gait                  Stairs            Wheelchair Mobility    Modified Rankin (Stroke Patients Only)       Balance Overall balance assessment: Needs assistance Sitting-balance support: Bilateral upper extremity supported;Feet supported Sitting balance-Leahy Scale: Poor Sitting balance - Comments: pt leans on UEs for support mostly it seems 2/2 fatigue.     Standing balance support: Bilateral upper extremity supported;During functional activity Standing balance-Leahy Scale: Poor                      Cognition Arousal/Alertness: Lethargic Behavior During Therapy: Flat affect Overall Cognitive Status: Impaired/Different from baseline Area of Impairment: Orientation;Attention;Memory;Following commands;Safety/judgement;Awareness;Problem solving Orientation Level: Disoriented to;Place;Time;Situation Current Attention Level: Focused Memory: Decreased short-term memory Following Commands: Follows one step commands inconsistently Safety/Judgement: Decreased awareness of safety;Decreased awareness of deficits Awareness: Intellectual Problem Solving: Slow processing;Decreased initiation;Difficulty sequencing;Requires verbal cues;Requires tactile cues General Comments: pt lethargic today and having increased difficulty following directions.      Exercises      General Comments        Pertinent Vitals/Pain Pain Assessment: Faces Faces Pain Scale: Hurts little more Pain Location: pt holding her head and answers "tes" when asked about headache.   Pain Descriptors / Indicators: Headache Pain Intervention(s): Repositioned;Patient requesting pain meds-RN notified    Home Living                      Prior Function  PT Goals (current goals can now be found in the care plan section) Acute Rehab PT Goals Patient Stated Goal: pt lethargic and unable to state today.   PT Goal Formulation: With patient Time For Goal Achievement: 03/21/14 Potential to Achieve Goals:  Good Progress towards PT goals: Not progressing toward goals - comment (Increased lethargy)    Frequency  Min 3X/week    PT Plan Current plan remains appropriate    Co-evaluation             End of Session Equipment Utilized During Treatment: Gait belt Activity Tolerance: Patient tolerated treatment well Patient left: in chair;with call bell/phone within reach;with family/visitor present     Time: 3403-7096 PT Time Calculation (min) (ACUTE ONLY): 19 min  Charges:  $Therapeutic Activity: 8-22 mins                    G CodesCatarina Hartshorn, [place] 03/17/2014, 10:34 AM

## 2014-03-18 ENCOUNTER — Inpatient Hospital Stay (HOSPITAL_COMMUNITY)
Admission: RE | Admit: 2014-03-18 | Discharge: 2014-04-05 | DRG: 057 | Disposition: A | Discharge status: Skilled Nursing Facility | Payer: Medicare Other | Source: Intra-hospital | Attending: Physical Medicine & Rehabilitation | Admitting: Physical Medicine & Rehabilitation

## 2014-03-18 ENCOUNTER — Encounter (HOSPITAL_COMMUNITY): Payer: Self-pay | Admitting: Radiology

## 2014-03-18 DIAGNOSIS — I10 Essential (primary) hypertension: Secondary | ICD-10-CM | POA: Diagnosis present

## 2014-03-18 DIAGNOSIS — R5383 Other fatigue: Secondary | ICD-10-CM

## 2014-03-18 DIAGNOSIS — G4700 Insomnia, unspecified: Secondary | ICD-10-CM | POA: Diagnosis present

## 2014-03-18 DIAGNOSIS — B965 Pseudomonas (aeruginosa) (mallei) (pseudomallei) as the cause of diseases classified elsewhere: Secondary | ICD-10-CM | POA: Diagnosis present

## 2014-03-18 DIAGNOSIS — R278 Other lack of coordination: Secondary | ICD-10-CM | POA: Diagnosis present

## 2014-03-18 DIAGNOSIS — B0089 Other herpesviral infection: Secondary | ICD-10-CM | POA: Diagnosis present

## 2014-03-18 DIAGNOSIS — G919 Hydrocephalus, unspecified: Secondary | ICD-10-CM

## 2014-03-18 DIAGNOSIS — Z9889 Other specified postprocedural states: Secondary | ICD-10-CM | POA: Diagnosis not present

## 2014-03-18 DIAGNOSIS — N390 Urinary tract infection, site not specified: Secondary | ICD-10-CM | POA: Diagnosis present

## 2014-03-18 DIAGNOSIS — I671 Cerebral aneurysm, nonruptured: Secondary | ICD-10-CM | POA: Diagnosis not present

## 2014-03-18 DIAGNOSIS — I609 Nontraumatic subarachnoid hemorrhage, unspecified: Secondary | ICD-10-CM

## 2014-03-18 DIAGNOSIS — E785 Hyperlipidemia, unspecified: Secondary | ICD-10-CM | POA: Diagnosis present

## 2014-03-18 DIAGNOSIS — F17210 Nicotine dependence, cigarettes, uncomplicated: Secondary | ICD-10-CM | POA: Diagnosis present

## 2014-03-18 DIAGNOSIS — R262 Difficulty in walking, not elsewhere classified: Secondary | ICD-10-CM | POA: Diagnosis not present

## 2014-03-18 DIAGNOSIS — Z982 Presence of cerebrospinal fluid drainage device: Secondary | ICD-10-CM

## 2014-03-18 DIAGNOSIS — R4182 Altered mental status, unspecified: Secondary | ICD-10-CM

## 2014-03-18 DIAGNOSIS — M6281 Muscle weakness (generalized): Secondary | ICD-10-CM | POA: Diagnosis not present

## 2014-03-18 DIAGNOSIS — A499 Bacterial infection, unspecified: Secondary | ICD-10-CM | POA: Diagnosis not present

## 2014-03-18 DIAGNOSIS — I69093 Ataxia following nontraumatic subarachnoid hemorrhage: Secondary | ICD-10-CM | POA: Diagnosis not present

## 2014-03-18 DIAGNOSIS — G910 Communicating hydrocephalus: Secondary | ICD-10-CM | POA: Diagnosis present

## 2014-03-18 DIAGNOSIS — B009 Herpesviral infection, unspecified: Secondary | ICD-10-CM | POA: Diagnosis not present

## 2014-03-18 DIAGNOSIS — R159 Full incontinence of feces: Secondary | ICD-10-CM | POA: Diagnosis present

## 2014-03-18 DIAGNOSIS — I6031 Nontraumatic subarachnoid hemorrhage from right posterior communicating artery: Secondary | ICD-10-CM | POA: Diagnosis not present

## 2014-03-18 DIAGNOSIS — K219 Gastro-esophageal reflux disease without esophagitis: Secondary | ICD-10-CM | POA: Diagnosis not present

## 2014-03-18 DIAGNOSIS — I679 Cerebrovascular disease, unspecified: Secondary | ICD-10-CM | POA: Diagnosis not present

## 2014-03-18 DIAGNOSIS — F329 Major depressive disorder, single episode, unspecified: Secondary | ICD-10-CM | POA: Diagnosis not present

## 2014-03-18 LAB — CBC
HEMATOCRIT: 34.6 % — AB (ref 36.0–46.0)
Hemoglobin: 11.6 g/dL — ABNORMAL LOW (ref 12.0–15.0)
MCH: 32.2 pg (ref 26.0–34.0)
MCHC: 33.5 g/dL (ref 30.0–36.0)
MCV: 96.1 fL (ref 78.0–100.0)
PLATELETS: 228 10*3/uL (ref 150–400)
RBC: 3.6 MIL/uL — AB (ref 3.87–5.11)
RDW: 13.7 % (ref 11.5–15.5)
WBC: 9.6 10*3/uL (ref 4.0–10.5)

## 2014-03-18 LAB — BASIC METABOLIC PANEL
ANION GAP: 5 (ref 5–15)
BUN: 9 mg/dL (ref 6–23)
CO2: 28 mmol/L (ref 19–32)
CREATININE: 0.51 mg/dL (ref 0.50–1.10)
Calcium: 8.9 mg/dL (ref 8.4–10.5)
Chloride: 107 mEq/L (ref 96–112)
GFR calc Af Amer: >90 mL/min (ref >90)
GFR calc non Af Amer: >90 mL/min (ref >90)
Glucose, Bld: 137 mg/dL — ABNORMAL HIGH (ref 70–99)
Potassium: 3.8 mmol/L (ref 3.5–5.1)
Sodium: 140 mmol/L (ref 135–145)

## 2014-03-18 LAB — MAGNESIUM: Magnesium: 1.9 mg/dL (ref 1.5–2.5)

## 2014-03-18 LAB — PHOSPHORUS: Phosphorus: 2.9 mg/dL (ref 2.3–4.6)

## 2014-03-18 LAB — CULTURE, BLOOD (ROUTINE X 2): Culture: NO GROWTH

## 2014-03-18 MED ORDER — NIMODIPINE 30 MG PO CAPS
60.0000 mg | ORAL_CAPSULE | ORAL | Status: DC
Start: 2014-03-18 — End: 2014-03-28
  Administered 2014-03-18 – 2014-03-28 (×53): 60 mg via ORAL
  Filled 2014-03-18 (×68): qty 2

## 2014-03-18 MED ORDER — DEXAMETHASONE 0.5 MG PO TABS
1.0000 mg | ORAL_TABLET | Freq: Two times a day (BID) | ORAL | Status: DC
  Administered 2014-03-18 – 2014-03-22 (×9): 1 mg via ORAL
  Filled 2014-03-18 (×13): qty 2

## 2014-03-18 MED ORDER — ACETAMINOPHEN 325 MG PO TABS
325.0000 mg | ORAL_TABLET | ORAL | Status: DC | PRN
  Administered 2014-03-19: 325 mg via ORAL
  Administered 2014-03-20 – 2014-03-25 (×4): 650 mg via ORAL
  Administered 2014-04-02: 325 mg via ORAL
  Filled 2014-03-18 (×7): qty 2
  Filled 2014-03-18: qty 1

## 2014-03-18 MED ORDER — POTASSIUM PHOSPHATES 15 MMOLE/5ML IV SOLN
30.0000 mmol | Freq: Once | INTRAVENOUS | Status: DC
  Administered 2014-03-18: 30 mmol via INTRAVENOUS
  Filled 2014-03-18: qty 10

## 2014-03-18 MED ORDER — ONDANSETRON HCL 4 MG PO TABS: 4.0000 mg | ORAL_TABLET | Freq: Four times a day (QID) | ORAL | Status: DC | PRN

## 2014-03-18 MED ORDER — ACETAMINOPHEN 325 MG PO TABS
650.0000 mg | ORAL_TABLET | Freq: Four times a day (QID) | ORAL | Status: DC | PRN
  Filled 2014-03-18: qty 2

## 2014-03-18 MED ORDER — ONDANSETRON HCL 4 MG/2ML IJ SOLN: 4.0000 mg | Freq: Four times a day (QID) | INTRAMUSCULAR | Status: DC | PRN

## 2014-03-18 MED ORDER — SODIUM CHLORIDE 0.9 % IJ SOLN
10.0000 mL | INTRAMUSCULAR | Status: DC | PRN
  Administered 2014-03-18: 10 mL
  Administered 2014-03-19 (×2): 20 mL
  Administered 2014-03-24: 10 mL
  Administered 2014-03-26 – 2014-03-28 (×3): 20 mL
  Filled 2014-03-18 (×6): qty 40

## 2014-03-18 MED ORDER — SIMVASTATIN 20 MG PO TABS
20.0000 mg | ORAL_TABLET | Freq: Every day | ORAL | Status: DC
  Administered 2014-03-18 – 2014-04-04 (×18): 20 mg via ORAL
  Filled 2014-03-18 (×19): qty 1

## 2014-03-18 MED ORDER — MAGNESIUM SULFATE 2 GM/50ML IV SOLN
2.0000 g | Freq: Once | INTRAVENOUS | Status: AC
  Administered 2014-03-18: 2 g via INTRAVENOUS
  Filled 2014-03-18: qty 50

## 2014-03-18 MED ORDER — ENSURE COMPLETE PO LIQD
237.0000 mL | Freq: Two times a day (BID) | ORAL | Status: DC
  Administered 2014-03-19 – 2014-04-05 (×27): 237 mL via ORAL

## 2014-03-18 MED ORDER — FAMOTIDINE 20 MG PO TABS
20.0000 mg | ORAL_TABLET | Freq: Every day | ORAL | Status: DC
  Administered 2014-03-19 – 2014-04-05 (×17): 20 mg via ORAL
  Filled 2014-03-18 (×20): qty 1

## 2014-03-18 MED ORDER — SODIUM CHLORIDE 0.9 % IJ SOLN
10.0000 mL | Freq: Two times a day (BID) | INTRAMUSCULAR | Status: DC
  Administered 2014-03-27 (×2): 10 mL

## 2014-03-18 MED ORDER — SORBITOL 70 % SOLN: 30.0000 mL | Freq: Every day | Status: DC | PRN

## 2014-03-18 NOTE — Progress Notes (Signed)
Patient hypotensive. Patient is A&Ox4 and is producing adequate urine output. Dr. [place] Bells notified. No new orders. Will continue to monitor closely.

## 2014-03-18 NOTE — PMR Pre-admission (Signed)
PMR Admission Coordinator Pre-Admission Assessment  Patient: Sharon [name] is an 80 y.o., female MRN: [id number] DOB: Jun 23, 1934 Height: 5\' 3"  (160 cm) Weight: 52.4 kg (115 lb 8.3 oz)              Insurance Information HMO: No    PPO:       PCP:       IPA:       80/20:       OTHER:   PRIMARY: Medicare A/B      Policy#: [id number]      Subscriber: Herbie Baltimore CM Name:        Phone#:       Fax#:   Pre-Cert#:        Employer: Works in Customer service [employer] Benefits:  Phone #:       Name: Checked in [place]. Date: 09/02/01     Deduct: $1288      Out of Pocket Max: none      Life Max: unlimited CIR: 100%      SNF: 100 days Outpatient: 80%     Co-Pay: 20% Home Health: 100%      Co-Pay: none DME: 80%     Co-Pay: 20% Providers: patient's choice  SECONDARY: State farm supplement      Policy#: [id number]      Subscriber: Herbie Baltimore CM Name:        Phone#:       Fax#:   Pre-Cert#:        Employer:   Benefits:  Phone #: [phone]     Name:   Eff. Date:       Deduct:        Out of Pocket Max:        Life Max:   CIR:        SNF:   Outpatient:       Co-Pay:   Home Health:        Co-Pay:   DME:       Co-Pay:    Emergency Contact Information Contact Information    Name Relation Home Work [place] Significant other [phone]  [phone]   [facility] Daughter   [phone]   Cochrane,Arnold "Haynes Bast   [phone]   Leda Roys Daughter   [phone]   Farrin, Shadle   [phone]     Current Medical History  Patient Admitting Diagnosis:  R SAH  History of Present Illness: A 80 y.o. right handed female with history of hyperlipidemia. Admitted 03/02/2014 with altered mental status. Patient independent prior to admission living with her significant other. By report patient had been experienced headache with weakness while at a recent dinner party. Initial cranial CT scan negative. MRI showed focal diffusion signal abnormality within the right sylvian fissure.  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 Lumbar puncture completed showing significant subarachnoid blood mixed with xanthochromic fluid. Interventional radiology consulted with cerebral arteriogram completed and underwent coiling of aneurysm 03/05/2014. Patient maintained on mechanical ventilation. Follow-up cranial CT scan showed no visible infarct or acute hemorrhage after coiling. Patient was extubated 03/06/2014. Follow-up serial cranial CT scans showed increasing communicating hydrocephalus with patient having episodes of increasing confusion and incontinence. Follow-up neurosurgery underwent right occipital VP shunt 03/15/2014 per Dr.Pool. Patient with low-grade fever meningitis ruled out continued on broad-spectrum antibiotics and later discontinued. Decadron protocol as directed.Nimotop protocol for monitoring of blood pressure. Presently on a mechanical soft diet. Physical therapy evaluation completed with  recommendations of physical medicine rehabilitation consult. Patient to be admitted for comprehensive inpatient rehabilitation program.    Past Medical History  Past Medical History  Diagnosis Date  . Dyslipidemia   . Cystitis 03/02/2014    acute   . UTI (urinary tract infection) 03/02/2014  . Arthritis     osteo    Family History  family history includes Hypertension in her father and mother.  Prior Rehab/Hospitalizations:  [facility] to a chiropractor regularly.   Current Medications   Current facility-administered medications:  .  0.9 %  sodium chloride infusion, , Intravenous, Continuous, Charlie Pitter, MD, Last Rate: 100 mL/hr at 03/18/14 1200 .  acetaminophen (TYLENOL) tablet 650 mg, 650 mg, Oral, Q6H PRN, Erick Colace, NP, 650 mg at 03/17/14 2353 .  dexamethasone (DECADRON) injection 1 mg, 1 mg, Intravenous, Q12H, Erick Colace, NP, 1 mg at 03/18/14 1019 .  famotidine (PEPCID) tablet 20 mg, 20 mg, Oral,  Daily, Brand Males, MD, 20 mg at 03/18/14 1019 .  feeding supplement (ENSURE COMPLETE) (ENSURE COMPLETE) liquid 237 mL, 237 mL, Oral, BID BM, Heather Cornelison Pitts, RD, 237 mL at 03/18/14 1019 .  loperamide (IMODIUM) 1 MG/5ML solution 4 mg, 4 mg, Oral, Q4H PRN, Kara Mead V, MD, 4 mg at 03/10/14 1615 .  niMODipine (NIMOTOP) capsule 60 mg, 60 mg, Oral, 6 times per day, Brand Males, MD, 60 mg at 03/18/14 0838 .  potassium phosphate 30 mmol in dextrose 5 % 500 mL infusion, 30 mmol, Intravenous, Once, Rush Farmer, MD, 30 mmol at 03/18/14 1014 .  simvastatin (ZOCOR) tablet 20 mg, 20 mg, Oral, q1800, Modena Jansky, MD, 20 mg at 03/17/14 1840 .  sodium chloride 0.9 % bolus 500 mL, 500 mL, Intravenous, Once, Charlie Pitter, MD .  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  Drives.   Home Assistive Devices / Equipment Home Assistive Devices/Equipment: None Home Equipment: None  Prior Functional Level Prior Function Level of Independence: Independent Comments: was going to yoga several times a week   Current Functional Level Cognition  Arousal/Alertness: Awake/alert Overall Cognitive Status: Impaired/Different from baseline Current Attention Level: Sustained Orientation Level: Oriented X4 Following Commands: Follows one step commands with increased time Safety/Judgement: Decreased awareness of safety, Decreased awareness of deficits General Comments: arouses easily, but returns to sleep when not stimulated Attention: Sustained Sustained Attention: Impaired Sustained Attention Impairment: Verbal basic, Functional basic Memory:  Impaired Memory Impairment: Decreased recall of new information, Decreased short term memory, Retrieval deficit Decreased Short Term Memory: Verbal basic Awareness: Impaired Awareness Impairment: Intellectual impairment, Emergent impairment, Anticipatory impairment Problem Solving: Impaired Problem Solving Impairment: Verbal basic, Functional basic Executive Function: Self Monitoring, Self Correcting, Decision Making, Reasoning, Organizing Safety/Judgment: Impaired    Extremity Assessment (includes Sensation/Coordination)  Lower Extremity Assessment: Overall WFL for tasks assessed (strength 4/5 grossly, + arthritic changes, poor coordination)   ADLs  Overall ADL's : Needs assistance/impaired Eating/Feeding: Set up, Sitting Grooming: Oral care, Wash/dry hands, Wash/dry face, Minimal assistance, Sitting Upper Body Bathing: Minimal assitance, Sitting Lower Body Bathing: Sit to/from stand, Moderate assistance Upper Body Dressing : Minimal assistance, Sitting Lower Body Dressing: Moderate assistance, Sit to/from stand Lower  Body Dressing Details (indicate cue type and reason): able to cross her foot over opposite knee to donn socks Toilet Transfer: Minimal assistance, Stand-pivot, BSC Toileting- Clothing Manipulation and Hygiene: Minimal assistance, Sit to/from stand Functional mobility during ADLs: Minimal assistance, +2 for safety/equipment    Mobility  Overal bed mobility: Needs Assistance Bed Mobility: Supine to Sit Rolling: Supervision Supine to sit: Min guard Sit to supine: Supervision General bed mobility comments: pt up in chair    Transfers  Overall transfer level: Needs assistance Equipment used: 1 person hand held assist Transfers: Sit to/from Stand, Stand Pivot Transfers Sit to Stand: Min assist Stand pivot transfers: Min assist General transfer comment: sits abruptly, braces legs on chair for stability in standing    Ambulation / Gait / Stairs / Wheelchair  Mobility  Ambulation/Gait Ambulation/Gait assistance: Museum/gallery curator (Feet): 80 Feet Assistive device: 1 person hand held assist Gait Pattern/deviations: Step-through pattern, Decreased stride length, Narrow base of support Gait velocity: decreased Gait velocity interpretation: Below normal speed for age/gender General Gait Details: pt continues to be unsteady and reaches for furniture and rail in hallway.  pt seems unaware of balance deficits.  pt does endorse dizziness with BP monitored during session.      Posture / Balance Dynamic Sitting Balance Sitting balance - Comments: pt leans on UEs for support mostly it seems 2/2 fatigue.      Special needs/care consideration BiPAP/CPAP No CPM No Continuous Drip IV 0.9% NS 100 ml/hr Dialysis No       Life Vest No Oxygen No Special Bed No Trach Size No Wound Vac (area) No    Skin NO                            Bowel mgmt: Last BM 03/17/14 Bladder mgmt: Voiding on bedpan with some incontinence Diabetic mgmt No    Previous Home Environment Living Arrangements: Spouse/significant other Available Help at Discharge: Family, Friend(s), Available 24 hours/day Type of Home: House Home Layout: One level Home Access: Stairs to enter Technical brewer of Steps: 2 Bathroom Toilet: [place]: No Additional Comments: Pt owns an Customer service manager.  Discharge Living Setting Plans for Discharge Living Setting: Patient's home, House, Lives with (comment) (Lives with significant other, Ole.) Type of Home at Discharge: House Discharge Home Layout: Multi-level, Full bath on main level, Able to live on main level with bedroom/bathroom (Two level home with basement, so 3 levels.) Alternate Level Stairs-Number of Steps: Flight (Does not have to go upstairs.) Discharge Home Access: Stairs to enter [employer] of Steps: 6 steps entry per daughter Does the patient have any problems obtaining your medications?:  No  Social/Family/Support Systems Patient Roles: Parent, Other (Comment) (Has a significant other and 4 children.) Contact Information: Maryelizabeth Rowan - significant other [phone] Anticipated Caregiver: Ole and herself Ability/Limitations of Caregiver: Ole is retired and can provide supervision.  Other family available as needed. Caregiver Availability: 24/7 Discharge Plan Discussed with Primary Caregiver: Yes Is Caregiver In Agreement with Plan?: Yes Does Caregiver/Family have Issues with Lodging/Transportation while Pt is in Rehab?: No  Goals/Additional Needs Patient/Family Goal for Rehab: PT/OT/ST supervision goals Expected length of stay: 8-12 days Cultural Considerations: Attends [employer] Dietary Needs: Dys 3, thin liquids Equipment Needs: TBD Pt/Family Agrees to Admission and willing to participate: Yes Program Orientation Provided & Reviewed with Pt/Caregiver Including Roles  & Responsibilities: Yes  Decrease burden of Care through IP  rehab admission: N/A  Possible need for SNF placement upon discharge: Not anticipated  Patient Condition: This patient's medical and functional status has changed since the consult dated: 03/08/14 in which the Rehabilitation Physician determined and documented that the patient's condition is appropriate for intensive rehabilitative care in an inpatient rehabilitation facility. See "History of Present Illness" (above) for medical update. Functional changes are: Currently requiring min assist for transfers and tolerating up in chair. Patient's medical and functional status update has been discussed with the Rehabilitation physician and patient remains appropriate for inpatient rehabilitation. Will admit to inpatient rehab today.  Preadmission Screen Completed By:  Retta Diones, 03/18/2014 12:30 PM ______________________________________________________________________   Discussed status with Dr. Letta Pate on 03/18/14 at 1229 and  received telephone approval for admission today.  Admission Coordinator:  Retta Diones, time1229/Date01/14/16

## 2014-03-18 NOTE — Progress Notes (Signed)
Rehab admissions - Awaiting medical readiness and neuro clearance for acute inpatient rehab admission.  I do have beds available on rehab today and can admit today if medically ready for rehab.  Call me for questions.  #[phone]

## 2014-03-18 NOTE — Progress Notes (Signed)
Rehab admissions - Notified that patient has been cleared by Dr. Annette Stable.  Will admit to acute inpatient rehab today.  Call me for questions.  #[phone]

## 2014-03-18 NOTE — Progress Notes (Signed)
Patient arrived onto unit with nurse tech and patient's daughter via wheelchair. Nurse tech and daughter assisted patient to bed stand pivot minimal assistance. Patient and patient's daughter given admission packet and oriented to room and the rehab unit. Safety plan and precautions discussed, and understanding verbalized. All questions answered. Will continue to monitor patient.

## 2014-03-18 NOTE — Progress Notes (Signed)
The patient looks much better today. She is awake and eating. She is out of bed and participating with therapy. She is ready for rehabilitation. Wounds are all healing well. Motor and sensory exam intact. Speech fluent. Confusion much improved.

## 2014-03-18 NOTE — H&P (Signed)
Physical Medicine and Rehabilitation Admission H&P   Chief Complaint  Patient presents with  . Altered Mental Status  . Headache  : HPI: Sharon [name] is a 80 y.o. right handed female with history of hyperlipidemia. Admitted 03/02/2014 with altered mental status. Patient independent prior to admission living with her significant other. By report patient had been experienced headache with weakness while at a recent dinner party. Initial cranial CT scan negative. MRI showed focal diffusion signal abnormality within the right sylvian fissure. 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 Lumbar puncture completed showing significant subarachnoid blood mixed with xanthochromic fluid. Interventional radiology consulted with cerebral arteriogram completed and underwent coiling of aneurysm 03/05/2014. Patient maintained on mechanical ventilation. Follow-up cranial CT scan showed no visible infarct or acute hemorrhage after coiling. Patient was extubated 03/06/2014. Follow-up serial cranial CT scans showed increasing communicating hydrocephalus with patient having episodes of increasing confusion and incontinence. Follow-up neurosurgery underwent right occipital VP shunt 03/15/2014 per Dr.Pool. Patient with low-grade fever meningitis ruled out continued on broad-spectrum antibiotics and later discontinued. Decadron protocol as directed.Nimotop protocol for monitoring of blood pressure. Presently on a mechanical soft diet. Physical therapy evaluation completed with recommendations of physical medicine rehabilitation consult. Patient was admitted for comprehensive rehabilitation program  Patient sleeping awakens to voice and physical stimulation Patient denies any bowel or bladder problems however nursing states that she is incontinent of bowel and has bladder urgency  ROS Review of Systems  Genitourinary: Positive for urgency.   Neurological: Positive for headaches.  Psychiatric/Behavioral: Positive for memory loss.  All other systems reviewed and are negative   Past Medical History  Diagnosis Date  . Dyslipidemia   . Cystitis 03/02/2014    acute   . UTI (urinary tract infection) 03/02/2014  . Arthritis     osteo   Past Surgical History  Procedure Laterality Date  . Abdominal hysterectomy    . Radiology with anesthesia N/A 03/04/2014    Procedure: RADIOLOGY WITH ANESTHESIA; Surgeon: Medication Radiologist, MD; Location: [place]; Service: Radiology; Laterality: N/A;  . Radiology with anesthesia N/A 03/04/2014    Procedure: RADIOLOGY WITH ANESTHESIA; Surgeon: Rob Hickman, MD; Location: [facility]; Service: Radiology; Laterality: N/A;   Family History  Problem Relation Age of Onset  . Hypertension Mother   . Hypertension Father    Social History:  reports that she has been smoking Cigarettes. She has been smoking about 0.50 packs per day. She has never used smokeless tobacco. She reports that she does not drink alcohol or use illicit drugs. Allergies: No Known Allergies Medications Prior to Admission  Medication Sig Dispense Refill  . aspirin EC 81 MG tablet Take 81 mg by mouth daily.    . cholecalciferol (VITAMIN D) 1000 UNITS tablet Take 1,000 Units by mouth daily.    Marland Kitchen ibuprofen (ADVIL,MOTRIN) 200 MG tablet Take 200 mg by mouth every 6 (six) hours as needed.    . Minoxidil (ROGAINE EX) Apply 1 application topically daily.    . naproxen sodium (ANAPROX) 220 MG tablet Take 220 mg by mouth 2 (two) times daily with a meal.    . Omega-3 Fatty Acids (FISH OIL PO) Take 1 tablet by mouth daily.    Marland Kitchen RENOVA 0.02 % CREA Apply 1 application topically daily.  3  . simvastatin (ZOCOR) 20 MG tablet Take 1 tablet by mouth daily.  6  . vitamin E 100 UNIT capsule Take 100 Units by mouth daily.    Marland Kitchen  ondansetron (ZOFRAN ODT) 4 MG disintegrating tablet Take 1 tablet (4 mg total) by mouth every 8 (eight) hours as needed for nausea or vomiting. 2 tablet 0    Home: Home Living Family/patient expects to be discharged to:: Private residence Living Arrangements: Spouse/significant other Available Help at Discharge: Family, Friend(s) Type of Home: House Home Access: Stairs to enter Technical brewer of Steps: 2 Home Layout: One level Additional Comments: patient poor historian secondary to confusion at this time, began getting frustrated when she could not answer question regarding railings  Functional History: Prior Function Level of Independence: Independent Comments: was going to yoga several times a week   Functional Status:  Mobility: Bed Mobility Overal bed mobility: Needs Assistance Bed Mobility: Supine to Sit   Supine to sit: Min guard   General bed mobility comments: No physical A needed, but A for management of lines and bed linens as pt not maintaining eyes open.   Transfers Overall transfer level: Needs assistance Equipment used: 2 person hand held assist Transfers: Sit to/from [facility] Sit to Stand: Min assist;+2 physical assistance Stand pivot transfers: Min assist;+2 physical assistance    General transfer comment: Repeated coming to stand x3 for changing brief and peri hygiene. pt seems to fatigue quickly and attempts to sit without warning      ADL:    Cognition: Cognition Overall Cognitive Status: Impaired/Different from baseline Orientation Level: Oriented to person, Disoriented to time, Disoriented to situation, Oriented to place Cognition Arousal/Alertness: Awake/alert Behavior During Therapy: Impulsive Overall Cognitive Status: Impaired/Different from baseline Area of Impairment: Orientation, Attention, Memory, Following commands, Safety/judgement, Awareness, Problem solving Orientation Level:  Disoriented to, Time (pt able to state something is wrong with her brain. ) Current Attention Level: Focused Memory: Decreased short-term memory Following Commands: Follows one step commands consistently, Follows multi-step commands inconsistently Safety/Judgement: Decreased awareness of safety, Decreased awareness of deficits Awareness: Intellectual Problem Solving: Slow processing, Decreased initiation, Difficulty sequencing, Requires verbal cues, Requires tactile cues General Comments: pt with very short attention and easily distracted. pt gets irritated easily and needs gentle cueing along with distraction for participation. pt able to state she was in the hospital, but only knew something was wrong with her brain when asked about situation.   Physical Exam: Blood pressure 125/56, pulse 73, temperature 97.9 F (36.6 C), temperature source Oral, resp. rate 19, height _0  (1.6 m), weight 63.4 kg (139 lb 12.4 oz), SpO2 95 %. Physical Exam Vitals reviewed. Constitutional: She appears well-developed.  HENT:  Head: Normocephalic.  Eyes: EOM are normal.  Neck: Normal range of motion. Neck supple. No thyromegaly present.  Cardiovascular: Normal rate and regular rhythm.  Respiratory: Effort normal and breath sounds normal. No respiratory distress.  GI: Soft. Bowel sounds are normal. She exhibits no distension.  Neurological: She is alert.  Patient is oriented to name, age and date of birth. She does follow simple commands. Did quite well cognitively during initial part of exam but became oppositional when discussing date. Began to confabulate . Mild left sided dysmetria and ataxia. RUE: 5/5 Deltoid, 5/5 Biceps, 5/5 Triceps, 5/5 Wrist Ext, 5/5 Grip LUE: 5/5 Deltoid, 5/5 Biceps, 5/5 Triceps, 5/5 Wrist Ext, 5/5 Grip RLE: HF 5/5, KE 5/5, KF 5/5, ADF 5/5, APF 5/5 LLE: HF 5/5, KE 5/5, HF, 5/5, ADF 5/5, APF 5/5 No sensory deficits, But question patient's awareness  Skin: Skin is warm  and dry    Lab Results Last 48 Hours    Results for orders placed  or performed during the hospital encounter of 03/02/14 (from the past 48 hour(s))  Magnesium Status: None   Collection Time: 03/07/14 2:25 AM  Result Value Ref Range   Magnesium 2.4 1.5 - 2.5 mg/dL  Phosphorus Status: Abnormal   Collection Time: 03/07/14 2:25 AM  Result Value Ref Range   Phosphorus 1.9 (L) 2.3 - 4.6 mg/dL  Basic metabolic panel Status: Abnormal   Collection Time: 03/07/14 2:25 AM  Result Value Ref Range   Sodium 138 135 - 145 mmol/L    Comment: Please note change in reference range.   Potassium 3.3 (L) 3.5 - 5.1 mmol/L    Comment: Please note change in reference range. DELTA CHECK NOTED    Chloride 112 96 - 112 mEq/L   CO2 23 19 - 32 mmol/L   Glucose, Bld 144 (H) 70 - 99 mg/dL   BUN 14 6 - 23 mg/dL   Creatinine, Ser 0.49 (L) 0.50 - 1.10 mg/dL   Calcium 7.0 (L) 8.4 - 10.5 mg/dL   GFR calc non Af Amer >90 >90 mL/min   GFR calc Af Amer >90 >90 mL/min    Comment: (NOTE) The eGFR has been calculated using the CKD EPI equation. This calculation has not been validated in all clinical situations. eGFR's persistently <90 mL/min signify possible Chronic Kidney Disease.    Anion gap 3 (L) 5 - 15  CBC with Differential Status: Abnormal   Collection Time: 03/07/14 2:25 AM  Result Value Ref Range   WBC 11.2 (H) 4.0 - 10.5 K/uL   RBC 3.10 (L) 3.87 - 5.11 MIL/uL   Hemoglobin 9.7 (L) 12.0 - 15.0 g/dL   HCT 29.6 (L) 36.0 - 46.0 %   MCV 95.5 78.0 - 100.0 fL   MCH 31.3 26.0 - 34.0 pg   MCHC 32.8 30.0 - 36.0 g/dL   RDW 13.1 11.5 - 15.5 %   Platelets 237 150 - 400 K/uL   Neutrophils Relative % 90 (H) 43 - 77 %   Neutro Abs 10.1 (H) 1.7 - 7.7 K/uL   Lymphocytes Relative 4 (L) 12 - 46 %   Lymphs Abs 0.5 (L) 0.7 - 4.0 K/uL   Monocytes Relative  6 3 - 12 %   Monocytes Absolute 0.7 0.1 - 1.0 K/uL   Eosinophils Relative 0 0 - 5 %   Eosinophils Absolute 0.0 0.0 - 0.7 K/uL   Basophils Relative 0 0 - 1 %   Basophils Absolute 0.0 0.0 - 0.1 K/uL  Clostridium Difficile by PCR Status: None   Collection Time: 03/07/14 5:43 AM  Result Value Ref Range   C difficile by pcr NEGATIVE NEGATIVE  Magnesium Status: None   Collection Time: 03/08/14 2:53 AM  Result Value Ref Range   Magnesium 2.4 1.5 - 2.5 mg/dL  Phosphorus Status: Abnormal   Collection Time: 03/08/14 2:53 AM  Result Value Ref Range   Phosphorus 2.0 (L) 2.3 - 4.6 mg/dL  Basic metabolic panel Status: Abnormal   Collection Time: 03/08/14 2:53 AM  Result Value Ref Range   Sodium 141 135 - 145 mmol/L    Comment: Please note change in reference range.   Potassium 3.8 3.5 - 5.1 mmol/L    Comment: Please note change in reference range.   Chloride 112 96 - 112 mEq/L   CO2 24 19 - 32 mmol/L   Glucose, Bld 127 (H) 70 - 99 mg/dL   BUN 10 6 - 23 mg/dL   Creatinine, Ser 0.57 0.50 - 1.10 mg/dL  Calcium 7.5 (L) 8.4 - 10.5 mg/dL   GFR calc non Af Amer 87 (L) >90 mL/min   GFR calc Af Amer >90 >90 mL/min    Comment: (NOTE) The eGFR has been calculated using the CKD EPI equation. This calculation has not been validated in all clinical situations. eGFR's persistently <90 mL/min signify possible Chronic Kidney Disease.    Anion gap 5 5 - 15  CBC with Differential Status: Abnormal   Collection Time: 03/08/14 2:53 AM  Result Value Ref Range   WBC 11.3 (H) 4.0 - 10.5 K/uL   RBC 3.33 (L) 3.87 - 5.11 MIL/uL   Hemoglobin 10.4 (L) 12.0 - 15.0 g/dL   HCT 31.5 (L) 36.0 - 46.0 %   MCV 94.6 78.0 - 100.0 fL   MCH 31.2 26.0 - 34.0 pg   MCHC 33.0 30.0 - 36.0 g/dL   RDW 13.3 11.5 - 15.5 %   Platelets 244 150 - 400  K/uL   Neutrophils Relative % 89 (H) 43 - 77 %   Neutro Abs 10.0 (H) 1.7 - 7.7 K/uL   Lymphocytes Relative 4 (L) 12 - 46 %   Lymphs Abs 0.4 (L) 0.7 - 4.0 K/uL   Monocytes Relative 7 3 - 12 %   Monocytes Absolute 0.8 0.1 - 1.0 K/uL   Eosinophils Relative 0 0 - 5 %   Eosinophils Absolute 0.0 0.0 - 0.7 K/uL   Basophils Relative 0 0 - 1 %   Basophils Absolute 0.0 0.0 - 0.1 K/uL      Imaging Results (Last 48 hours)    No results found.       Medical Problem List and Plan: 1. 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 DVT Prophylaxis/Anticoagulation: SCDs. Monitor for any signs of DVT 3. Pain Management: Tylenol as needed 4. Hyperlipidemia. Zocor 5. Neuropsych: This patient is capable of making decisions on her own behalf. 6. Skin/Wound Care: Routine skin checks 7. Fluids/Electrolytes/Nutrition: Strict I and O follow-up chemistries 8. Hypertension Nimotop protocol. Monitor with increased mobility     Post Admission Physician Evaluation: 1. 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 2. Patient is admitted to receive collaborative, interdisciplinary care between the physiatrist, rehab nursing staff, and therapy team. 3. Patient's level of medical complexity and substantial therapy needs in context of that medical necessity cannot be provided at a lesser intensity of care such as a SNF. 4. Patient has experienced substantial functional loss from his/her baseline which was documented above under the "Functional History" and "Functional Status" headings. Judging by the patient's diagnosis, physical exam, and functional history, the patient has potential  for functional progress which will result in measurable gains while on inpatient rehab. These gains will be of substantial and practical use upon discharge in facilitating mobility and self-care at the household level. 5. Physiatrist will provide 24 hour management of medical needs as well as oversight of the therapy plan/treatment and provide guidance as appropriate regarding the interaction of the two. 6. 24 hour rehab nursing will assist with bladder management, bowel management, safety, skin/wound care, disease management, medication administration, pain management and patient education and help integrate therapy concepts, techniques,education, etc. 7. PT will assess and treat for/with: pre gait, gait training, endurance , safety, equipment, neuromuscular re education. Goals are: Sup. 8. OT will assess and treat for/with:  ADLs, Cognitive perceptual skills, Neuromuscular re education, safety, endurance, equipment. Goals are: Sup. Therapy may not proceed with showering this patient. 9. SLP will assess and treat for/with: Memory, attention, concentration, speech, swallowing. Goals are: Supervision to min assist with medication management, improve accuracy with biographical information. 10. Case Management and Social Worker will assess and treat for psychological issues and discharge planning. 11. Team conference will be held weekly to assess progress toward goals and to determine barriers to discharge. 12. Patient will receive at least 3 hours of therapy per day at least 5 days per week. 13. ELOS: 8-12days  14. Prognosis: good     Charlett Blake M.D. [facility] FAAPM&R (Sports Med, Neuromuscular Med) Diplomate Am Board of Electrodiagnostic Med

## 2014-03-18 NOTE — Plan of Care (Signed)
Problem: Progression Outcomes Goal: Initial discharge plan initiated Outcome: Completed/Met Date Met:  03/18/14 Plan to discharge to CIR today

## 2014-03-18 NOTE — Progress Notes (Signed)
PULMONARY / CRITICAL CARE MEDICINE   Name: Sharon [name] MRN: [id number] DOB: 06-Apr-1934    ADMISSION DATE:  03/02/2014 CONSULTATION DATE:  03/05/14  REFERRING MD :  Dr. Garen Grams  REASON FOR CONSULTATION: Ventilator-dependent respiratory failure  INITIAL PRESENTATION:  80 year old woman, active smoker with a history of arthritis. She began to have headache and possibly some associated weakness dating back to the end of November. Since that time she had experienced some increased somnolence and off-and-on headache. Then 12/28 she was noted to be disoriented. She was admitted 12/29 for neurological evaluation. Lumbar puncture was consistent with subarachnoid bleeding. 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 She underwent coiling of the aneurysm on 03/05/14. Post procedure she was admitted to the ICU on mechanical ventilation.   STUDIES:  Head CT 12/29 >> indeterminate punctate calcification in the medial aspect of the right middle cranial fossa, possibly small meningioma LP 12/31 > xanthochromic, 7.8K RBC, 158 WBC (41 neutrophils). Glucose 36. Total protein 84. Gram stain negative CSF HSV 12/31 >>  Brain MRI 12/31 >> focal diffusion signal in the right sylvian fissure, right posterior indicating arterial elongated aneurysm, dilation of the lateral ventricles but no evidence of hydrocephalus 03/05/14 >> coiling of the right posterior communicating artery aneurysm. Also noted on angiography was a lft internal carotid artery aneurysm 03/06/14 - extubated 03/07/14: Improved mental status. Sitting in chair. Talking . Trying to work with PT ambulating. But pleasantly confused 03/11/14: Worsening encephalopathy and never got transferred out of ICU. PCCM back on as primary. Remains extubated. Intermittently agitated. Has hydrocephalus per RN 1/9: passed SBT  SUBJECTIVE/OVERNIGHT/INTERVAL HX No new complaints, post op 1/11 for  right occipital VP shunt placement.  VITAL SIGNS: Temp:  [96.7 F (35.9 C)-98.4 F (36.9 C)] 98.3 F (36.8 C) (01/14 0700) Resp:  [11-22] 14 (01/14 0800) BP: (72-120)/(34-69) 116/57 mmHg (01/14 0800) SpO2:  [94 %-100 %] 94 % (01/14 0800) Weight:  [52.4 kg (115 lb 8.3 oz)] 52.4 kg (115 lb 8.3 oz) (01/14 0800) Room air   HEMODYNAMICS:   VENTILATOR SETTINGS:   INTAKE / OUTPUT:  Intake/Output Summary (Last 24 hours) at 03/18/14 0919 Last data filed at 03/18/14 0800  Gross per 24 hour  Intake   4045 ml  Output   2150 ml  Net   1895 ml   PHYSICAL EXAMINATION: General: frail female wakes up, following commands and joking this AM. Neuro: Asleep but wakes easily and follows commands.  Moving all ext to commands. HEENT: PERRL, EOM-I and MMM, post op for VP shunt placement. Cardiovascular:  RRR, Nl S1/S2, -M/R/G. Lungs:  No wheeze,clear  Abdomen:  Soft, tender at incision site. Musculoskeletal:  No edema. Skin:  No rashes.  LABS: PULMONARY No results for input(s): PHART, PCO2ART, PO2ART, HCO3, TCO2, O2SAT in the last 168 hours.  Invalid input(s): PCO2, PO2  CBC  Recent Labs Lab 03/16/14 0800 03/17/14 0520 03/18/14 0410  HGB 12.5 11.7* 11.6*  HCT 38.3 34.4* 34.6*  WBC 14.8* 10.5 9.6  PLT 238 224 228   COAGULATION No results for input(s): INR in the last 168 hours.  CARDIAC  Recent Labs Lab 03/13/14 2116 03/14/14 0345 03/14/14 0916  TROPONINI <0.03 <0.03 <0.03   No results for input(s): PROBNP in the last 168 hours.  CHEMISTRY  Recent Labs Lab 03/11/14 1610 03/12/14 0500 03/13/14 2116 03/14/14 0345 03/15/14 0300 03/16/14 0800 03/17/14 0520 03/18/14 0410  NA 136 134* 134* 136 134* 131*  131* 140  K 4.3 4.3 3.8 4.1 4.4 4.1 4.1 3.8  CL 104 104 102 103 102 104 102 107  CO2 25 24 26 26 26 25 25 28   GLUCOSE 81 98 102* 101* 105* 110* 96 137*  BUN 11 11 8 9 12 11 8 9   CREATININE 0.51 0.58 0.68 0.64 0.63 0.64 0.58 0.51  CALCIUM 8.2* 7.7* 8.4 8.5 8.8  7.1* 7.5* 8.9  MG 2.1 2.3 2.0  --   --  2.0 2.1 1.9  PHOS 3.2 3.7  --   --   --  3.0 2.2* 2.9   Estimated Creatinine Clearance: 48.7 mL/min (by C-G formula based on Cr of 0.51).  LIVER  Recent Labs Lab 03/11/14 1610 03/14/14 0345 03/15/14 0300  AST 25 20 18   ALT 42* 26 25  ALKPHOS 64 69 64  BILITOT 0.6 0.6 0.4  PROT 5.7* 5.4* 5.5*  ALBUMIN 3.2* 3.0* 2.9*    INFECTIOUS No results for input(s): LATICACIDVEN, PROCALCITON in the last 168 hours.  ENDOCRINE CBG (last 3)  No results for input(s): GLUCAP in the last 72 hours.  IMAGING x48h Ct Head Wo Contrast  03/16/2014   CLINICAL DATA:  Hydrocephalus. Altered mental status. Status post shunt placement recent coiling of right posterior communicating region aneurysm.  EXAM: CT HEAD WITHOUT CONTRAST  TECHNIQUE: Contiguous axial images were obtained from the base of the skull through the vertex without intravenous contrast.  COMPARISON:  03/10/2014  FINDINGS: Embolization coils are again seen at the site of previously treated right posterior communicating region aneurysm with associated streak artifact. Right parietal approach ventriculostomy catheter has been placed and courses in or adjacent to the inferior atrium of the right lateral ventricle, traverses the third ventricle, and terminates in the region of the left ventral thalamus. A small amount of pneumocephalus is present with gas noted in the right atrium and left temporal horn. The ventricles are smaller in size than on the prior CT.  Trace residual subarachnoid hemorrhage is again seen in the sylvian fissures, less conspicuous than on the prior study. There is no evidence of acute large territory cortical infarct, new intracranial hemorrhage, mass, midline shift, or extra-axial collection. Small amount of periventricular white matter hypoattenuation is similar to the prior study and nonspecific but may reflect mild chronic small vessel ischemic disease.  Prior bilateral cataract  extraction is noted. Visualized mastoid air cells are clear. Visualized paranasal sinuses are clear.  IMPRESSION: 1. Interval ventriculostomy catheter placement as above. Decreased size of the ventricles. 2. Trace residual subarachnoid hemorrhage, decreased from prior.   Electronically Signed   By: Logan Bores   On: 03/16/2014 15:28   ASSESSMENT / PLAN:  PULMONARY ETT 03/05/14 >> 03/06/14 A:  Acute respiratory failure post procedure. Resolved History of tobacco use.  - s/p successful extubation 03/06/14. Current encephalopathy puts her at risk for intubation P:   Monitor clinically. Pulm Hygiene. Ambulate with PT. CIR consult today.  CARDIOVASCULAR A:  Hx of HLD. P:  Continue zocor. BP control as ordered. Nimotop.  RENAL A:   Hypokalemia, resolved P:   Follow BMP in AM. Replace electrolytes as indicated (K, Mg and Phos).  GASTROINTESTINAL A:   Nutrition. Dysphagia-->passed swallow eval 1/9 P:   H2 blockade for SUP Dysphagia 3 diet.  HEMATOLOGIC A:   DVT prophylaxis. P:  SCD.  INFECTIOUS Culture - negative A:   Fever likely was due to [facility]. Afebrile as of 03/12/14 P:   Monitor off abx  Ceftriaxone 12/30 >>  1/01 Ampicillin 12/30 >> 1/01 Acyclovir 12/30 >> 1/01 Vancomycin 12/31 >> 1/01 Cefazolin for surgical prophylaxis 1/11>>>  ENDOCRINE A:   No acute issues. P:   Monitor blood sugar on BMET  NEUROLOGIC A:   Acute encephalopathy 2nd to grade 3 SAH.PCOM aneurysm coiling 03/05/14 Obstructive hydrocephalus   - Significantly improved this AM. P:   Neuro placed shunt 03/15/14. Nimotop and Zocor for vasospasm prophylaxis to continue per neurosurgery KVO IVF. D/C haldol. Wean off decadron - reduced 03/17/2014, to 0.5 mg IV q12 x3 days then switch to prednisone 10 mg PO daily after that (on 1/17 change to PO and d/c IV).  Ready for transfer out of the ICU at this point, will await Dr. Irven Baltimore input.  PT to mobilize.  CIR consultation for rehab.  Will place  transfer order to SDU and to [facility], PCCM will sign off.  Rush Farmer, M.D. [facility] Pulmonary/Critical Care Medicine. Pager: [phone]. After hours pager: [phone].

## 2014-03-18 NOTE — Progress Notes (Signed)
Report called to CIR.  Patient transferred via wheelchair by NT to unit 4W with daughter, Maudie Mercury, and belongings. [place], [place]

## 2014-03-18 NOTE — Evaluation (Signed)
Occupational Therapy Evaluation Patient Details Name: Sharon [name] MRN: [id number] DOB: 09-08-1934 Today's Date: 03/18/2014    History of Present Illness 80 year old female admitted 03/02/14 due to AMS, aneurysmal SAH. PMH significant for DLD. CXR negative, CT indicated nothing acute. 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   Clinical Impression   Prior to admission, pt was active and independent.  She presents with lethargy, but easily aroused.  She demonstrates impaired cognition, generalized weakness, and poor balance interfering with ability to perform self care.  Pt has excellent family support and will do well with intense rehab.  Will follow acutely.    Follow Up Recommendations  CIR;Supervision/Assistance - 24 hour    Equipment Recommendations       Recommendations for Other Services       Precautions / Restrictions Precautions Precautions: Fall Restrictions Weight Bearing Restrictions: No      Mobility Bed Mobility               General bed mobility comments: pt up in chair  Transfers Overall transfer level: Needs assistance Equipment used: 1 person hand held assist Transfers: Sit to/from Stand;Stand Pivot Transfers Sit to Stand: Min assist Stand pivot transfers: Min assist       General transfer comment: sits abruptly, braces legs on chair for stability in standing    Balance     Sitting balance-Leahy Scale: Fair       Standing balance-Leahy Scale: Poor Standing balance comment: unable to stand without support                            ADL Overall ADL's : Needs assistance/impaired Eating/Feeding: Set up;Sitting   Grooming: Oral care;Wash/dry hands;Wash/dry face;Minimal assistance;Sitting   Upper Body Bathing: Minimal assitance;Sitting    Lower Body Bathing: Sit to/from stand;Moderate assistance   Upper Body Dressing : Minimal assistance;Sitting   Lower Body Dressing: Moderate assistance;Sit to/from stand Lower Body Dressing Details (indicate cue type and reason): able to cross her foot over opposite knee to donn socks Toilet Transfer: Minimal assistance;Stand-pivot;BSC   Toileting- Clothing Manipulation and Hygiene: Minimal assistance;Sit to/from stand       Functional mobility during ADLs: Minimal assistance;+2 for safety/equipment       Vision                     Perception     Praxis      Pertinent Vitals/Pain Pain Assessment: No/denies pain     Hand Dominance Right   Extremity/Trunk Assessment Upper Extremity Assessment Upper Extremity Assessment: Generalized weakness;Difficult to assess due to impaired cognition (full AROM)   Lower Extremity Assessment Lower Extremity Assessment: Defer to PT evaluation       Communication Communication Communication: No difficulties   Cognition Arousal/Alertness: Lethargic Behavior During Therapy: Flat affect Overall Cognitive Status: Impaired/Different from baseline Area of Impairment: Orientation;Attention;Memory;Following commands;Safety/judgement;Awareness;Problem solving Orientation Level: Disoriented to;Place;Time;Situation Current Attention Level: Sustained Memory: Decreased short-term memory Following Commands: Follows one step commands with increased time Safety/Judgement: Decreased awareness of safety;Decreased awareness of deficits Awareness: Intellectual Problem Solving: Slow processing;Decreased initiation;Difficulty sequencing;Requires verbal cues;Requires tactile cues General Comments: arouses easily, but returns to sleep when not stimulated   General Comments       Exercises       Shoulder Instructions  Home Living Family/patient expects to be discharged to:: Private residence Living Arrangements: Spouse/significant  other Available Help at Discharge: Family;Friend(s);Available 24 hours/day Type of Home: House Home Access: Stairs to enter [employer] of Steps: 2   Home Layout: One level         Bathroom Toilet: Standard     Home Equipment: None   Additional Comments: Pt owns an Customer service manager.      Prior Functioning/Environment Level of Independence: Independent        Comments: was going to yoga several times a week     OT Diagnosis: Generalized weakness;Cognitive deficits   OT Problem List: Decreased strength;Decreased activity tolerance;Impaired balance (sitting and/or standing);Decreased cognition;Decreased safety awareness;Decreased knowledge of use of DME or AE   OT Treatment/Interventions: Self-care/ADL training;DME and/or AE instruction;Therapeutic activities;Cognitive remediation/compensation;Patient/family education;Balance training    OT Goals(Current goals can be found in the care plan section) Acute Rehab OT Goals Patient Stated Goal: daughter wants pt to return to PLOF OT Goal Formulation: With patient/family Time For Goal Achievement: 04/01/14 Potential to Achieve Goals: Good ADL Goals Pt Will Perform Grooming: with modified independence;standing Pt Will Perform Upper Body Bathing: with modified independence;sitting Pt Will Perform Lower Body Bathing: with modified independence;sit to/from stand Pt Will Perform Upper Body Dressing: with modified independence;sitting Pt Will Perform Lower Body Dressing: with modified independence;sit to/from stand Pt Will Transfer to Toilet: with modified independence;ambulating;regular height toilet Pt Will Perform Toileting - Clothing Manipulation and hygiene: with modified independence;sit to/from stand Additional ADL Goal #1: Pt will use environmental clues to correctly respond to time and place. Additional ADL Goal #2: Pt will follow 3 step commands within 5 seconds of request.  OT Frequency: Min 2X/week   Barriers to  D/C:            Co-evaluation              End of Session Equipment Utilized During Treatment: Gait belt  Activity Tolerance: Patient limited by fatigue;Patient limited by lethargy Patient left: in chair;with call bell/phone within reach;with nursing/sitter in room   Time: 1115-1135 OT Time Calculation (min): 20 min Charges:  OT General Charges $OT Visit: 1 Procedure OT Evaluation $Initial OT Evaluation Tier I: 1 Procedure OT Treatments $Self Care/Home Management : 8-22 mins G-Codes:    Malka So 03/18/2014, 11:59 AM  [phone]

## 2014-03-19 ENCOUNTER — Inpatient Hospital Stay (HOSPITAL_COMMUNITY): Payer: Medicare Other | Admitting: Speech Pathology

## 2014-03-19 ENCOUNTER — Inpatient Hospital Stay (HOSPITAL_COMMUNITY): Payer: Medicare Other

## 2014-03-19 ENCOUNTER — Inpatient Hospital Stay (HOSPITAL_COMMUNITY): Payer: Medicare Other | Admitting: *Deleted

## 2014-03-19 DIAGNOSIS — G919 Hydrocephalus, unspecified: Secondary | ICD-10-CM

## 2014-03-19 DIAGNOSIS — I609 Nontraumatic subarachnoid hemorrhage, unspecified: Secondary | ICD-10-CM

## 2014-03-19 LAB — CBC WITH DIFFERENTIAL/PLATELET
Basophils Absolute: 0 K/uL (ref 0.0–0.1)
Basophils Relative: 0 % (ref 0–1)
Eosinophils Absolute: 0.1 K/uL (ref 0.0–0.7)
Eosinophils Relative: 1 % (ref 0–5)
HCT: 35.2 % — ABNORMAL LOW (ref 36.0–46.0)
Hemoglobin: 11.9 g/dL — ABNORMAL LOW (ref 12.0–15.0)
Lymphocytes Relative: 14 % (ref 12–46)
Lymphs Abs: 1.4 K/uL (ref 0.7–4.0)
MCH: 32.5 pg (ref 26.0–34.0)
MCHC: 33.8 g/dL (ref 30.0–36.0)
MCV: 96.2 fL (ref 78.0–100.0)
Monocytes Absolute: 0.8 K/uL (ref 0.1–1.0)
Monocytes Relative: 8 % (ref 3–12)
Neutro Abs: 8.1 K/uL — ABNORMAL HIGH (ref 1.7–7.7)
Neutrophils Relative %: 77 % (ref 43–77)
Platelets: 229 K/uL (ref 150–400)
RBC: 3.66 MIL/uL — ABNORMAL LOW (ref 3.87–5.11)
RDW: 14 % (ref 11.5–15.5)
WBC: 10.4 K/uL (ref 4.0–10.5)

## 2014-03-19 LAB — COMPREHENSIVE METABOLIC PANEL WITH GFR
ALT: 10 U/L (ref 0–35)
AST: 14 U/L (ref 0–37)
Albumin: 3.1 g/dL — ABNORMAL LOW (ref 3.5–5.2)
Alkaline Phosphatase: 61 U/L (ref 39–117)
Anion gap: 4 — ABNORMAL LOW (ref 5–15)
BUN: 7 mg/dL (ref 6–23)
CO2: 30 mmol/L (ref 19–32)
Calcium: 8.6 mg/dL (ref 8.4–10.5)
Chloride: 104 meq/L (ref 96–112)
Creatinine, Ser: 0.52 mg/dL (ref 0.50–1.10)
GFR calc Af Amer: >90 mL/min
GFR calc non Af Amer: 90 mL/min — ABNORMAL LOW
Glucose, Bld: 97 mg/dL (ref 70–99)
Potassium: 3.8 mmol/L (ref 3.5–5.1)
Sodium: 138 mmol/L (ref 135–145)
Total Bilirubin: 0.5 mg/dL (ref 0.3–1.2)
Total Protein: 5.7 g/dL — ABNORMAL LOW (ref 6.0–8.3)

## 2014-03-19 MED ORDER — QUETIAPINE FUMARATE 25 MG PO TABS
25.0000 mg | ORAL_TABLET | Freq: Every day | ORAL | Status: DC
  Administered 2014-03-19: 25 mg via ORAL
  Filled 2014-03-19 (×2): qty 1

## 2014-03-19 MED ORDER — QUETIAPINE FUMARATE 25 MG PO TABS: 25.0000 mg | ORAL_TABLET | Freq: Every day | ORAL | Status: DC

## 2014-03-19 NOTE — Progress Notes (Signed)
[facility] PHYSICAL MEDICINE & REHABILITATION     PROGRESS NOTE    Subjective/Complaints: Up most of night. At nursing station. Sitting with eyes closed. Occasionally responds to me.    Objective: Vital Signs: Blood pressure 111/65, pulse 82, temperature 98.5 F (36.9 C), temperature source Oral, resp. rate 18, weight 52.753 kg (116 lb 4.8 oz), SpO2 97 %. No results found.  Recent Labs  03/18/14 0410 03/19/14 0605  WBC 9.6 10.4  HGB 11.6* 11.9*  HCT 34.6* 35.2*  PLT 228 229    Recent Labs  03/18/14 0410 03/19/14 0605  NA 140 138  K 3.8 3.8  CL 107 104  GLUCOSE 137* 97  BUN 9 7  CREATININE 0.51 0.52  CALCIUM 8.9 8.6   CBG (last 3)  No results for input(s): GLUCAP in the last 72 hours.  Wt Readings from Last 3 Encounters:  03/18/14 52.753 kg (116 lb 4.8 oz)  03/18/14 52.4 kg (115 lb 8.3 oz)  10/11/13 57.607 kg (127 lb)    Physical Exam:  Constitutional: She appears well-developed.  HENT:  Head: Normocephalic.  Eyes: EOM are normal.  Neck: Normal range of motion. Neck supple. No thyromegaly present.  Cardiovascular: Normal rate and regular rhythm.  Respiratory: Effort normal and breath sounds normal. No respiratory distress.  GI: Soft. Bowel sounds are normal. She exhibits no distension.  Neurological: She is alert.  Patient is oriented to name. Keeps eyes closed. She does follow simple commands. Did quite well cognitively during initial part of exam but became oppositional when discussing date.  confabulates .  Marland Kitchen RUE: 5/5 Deltoid, 5/5 Biceps, 5/5 Triceps, 5/5 Wrist Ext, 5/5 Grip LUE: 5/5 Deltoid, 5/5 Biceps, 5/5 Triceps, 5/5 Wrist Ext, 5/5 Grip RLE: HF 5/5, KE 5/5, KF 5/5, ADF 5/5, APF 5/5 LLE: HF 5/5, KE 5/5, HF, 5/5, ADF 5/5, APF 5/5 No sensory deficits, But question patient's awareness  Skin: Skin is warm and dry  Assessment/Plan: 1. Functional deficits secondary to SAH/right posterior communicating artery aneurysm which require 3+ hours per  day of interdisciplinary therapy in a comprehensive inpatient rehab setting. Physiatrist is providing close team supervision and 24 hour management of active medical problems listed below. Physiatrist and rehab team continue to assess barriers to discharge/monitor patient progress toward functional and medical goals. FIM:       FIM - Toileting Toileting steps completed by patient: Adjust clothing prior to toileting, Performs perineal hygiene, Adjust clothing after toileting Toileting Assistive Devices: Grab bar or rail for support Toileting: 5: Supervision: Safety issues/verbal cues           Comprehension Comprehension Mode: Auditory Comprehension: 4-Understands basic 75 - 89% of the time/requires cueing 10 - 24% of the time  Expression Expression Mode: Verbal Expression: 4-Expresses basic 75 - 89% of the time/requires cueing 10 - 24% of the time. Needs helper to occlude trach/needs to repeat words.  Social Interaction Social Interaction: 3-Interacts appropriately 50 - 74% of the time - May be physically or verbally inappropriate.  Problem Solving Problem Solving: 3-Solves basic 50 - 74% of the time/requires cueing 25 - 49% of the time  Memory Memory: 4-Recognizes or recalls 75 - 89% of the time/requires cueing 10 - 24% of the time  Medical Problem List and Plan: 1. 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 DVT Prophylaxis/Anticoagulation: SCDs. Monitor for any signs of DVT 3. Pain Management: Tylenol as needed 4. Hyperlipidemia.  Zocor 5. Neuropsych: This patient is capable of making decisions on her own behalf.  -seroquel hs to help with sundowning 6. Skin/Wound Care: Routine skin checks 7. Fluids/Electrolytes/Nutrition: Strict I and O follow-up chemistries 8. Hypertension Nimotop protocol. Monitor with increased  mobility LOS (Days) 1 A FACE TO FACE EVALUATION WAS PERFORMED  Arfa Lamarca T 03/19/2014 8:21 AM

## 2014-03-19 NOTE — Progress Notes (Signed)
Meredith Staggers, MD Physician Signed Physical Medicine and Rehabilitation Consult Note 03/08/2014 12:57 PM  Related encounter: ED to Hosp-Admission (Discharged) from 03/02/2014 in [facility]    Expand All Collapse All        Physical Medicine and Rehabilitation Consult Reason for Consult: Sharon [name] Referring Physician: Critical care   HPI: Sharon [name] is a 80 y.o. right handed female with history of hyperlipidemia. Admitted 03/02/2014 with altered mental status. Patient independent prior to admission living with her significant other. By report a family patient had been experienced headache with weakness while at a recent dinner party. Initial cranial CT scan negative. MRI showed focal diffusion signal abnormality within the right sylvian fissure. 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 Lumbar puncture completed showing significant subarachnoid blood mixed with xanthochromic fluid. Interventional radiology consulted with cerebral arteriogram completed and underwent coiling of aneurysm 03/05/2014. Patient maintained on mechanical ventilation. Follow-up cranial CT scan showed no visible infarct or acute hemorrhage after coiling. Patient was extubated 03/06/2014. Patient with low-grade fever meningitis ruled out continued on broad-spectrum antibiotics. Decadron protocol as directed. Presently on a mechanical soft diet. Physical therapy evaluation completed with recommendations of physical medicine rehabilitation consult.   Review of Systems  Genitourinary: Positive for urgency.  Neurological: Positive for headaches.  Psychiatric/Behavioral: Positive for memory loss.  All other systems reviewed and are negative.  Past Medical History  Diagnosis Date  . Dyslipidemia   . Cystitis 03/02/2014    acute   . UTI (urinary tract infection) 03/02/2014  . Arthritis      osteo   Past Surgical History  Procedure Laterality Date  . Abdominal hysterectomy    . Radiology with anesthesia N/A 03/04/2014    Procedure: RADIOLOGY WITH ANESTHESIA; Surgeon: Medication Radiologist, MD; Location: [facility]; Service: Radiology; Laterality: N/A;  . Radiology with anesthesia N/A 03/04/2014    Procedure: RADIOLOGY WITH ANESTHESIA; Surgeon: Rob Hickman, MD; Location: [facility]; Service: Radiology; Laterality: N/A;   Family History  Problem Relation Age of Onset  . Hypertension Mother   . Hypertension Father    Social History:  reports that she has been smoking Cigarettes. She has been smoking about 0.50 packs per day. She has never used smokeless tobacco. She reports that she does not drink alcohol or use illicit drugs. Allergies: No Known Allergies Medications Prior to Admission  Medication Sig Dispense Refill  . aspirin EC 81 MG tablet Take 81 mg by mouth daily.    . cholecalciferol (VITAMIN D) 1000 UNITS tablet Take 1,000 Units by mouth daily.    Marland Kitchen ibuprofen (ADVIL,MOTRIN) 200 MG tablet Take 200 mg by mouth every 6 (six) hours as needed.    . Minoxidil (ROGAINE EX) Apply 1 application topically daily.    . naproxen sodium (ANAPROX) 220 MG tablet Take 220 mg by mouth 2 (two) times daily with a meal.    . Omega-3 Fatty Acids (FISH OIL PO) Take 1 tablet by mouth daily.    Marland Kitchen RENOVA 0.02 % CREA Apply 1 application topically daily.  3  . simvastatin (ZOCOR) 20 MG tablet Take 1 tablet by mouth daily.  6  . vitamin E 100 UNIT capsule Take 100 Units by mouth daily.    . ondansetron (ZOFRAN ODT) 4 MG disintegrating tablet Take 1 tablet (4 mg total) by mouth every 8 (eight) hours as needed for nausea or vomiting. 2 tablet 0    Home:  Home Living Family/patient expects to be discharged to:: Private residence Living Arrangements: Spouse/significant other Available Help at  Discharge: Family, Friend(s) Type of Home: House Home Access: Stairs to enter [employer] of Steps: 2 Home Layout: One level Additional Comments: patient poor historian secondary to confusion at this time, began getting frustrated when she could not answer question regarding railings  Functional History: Prior Function Level of Independence: Independent Comments: was going to yoga several times a week  Functional Status:  Mobility: Bed Mobility Overal bed mobility: Needs Assistance Bed Mobility: Supine to Sit Rolling: Supervision Supine to sit: Min assist Sit to supine: Min assist General bed mobility comments: cues fro attending to task and to slow down as pt a little impulsive.  Transfers Overall transfer level: Needs assistance Equipment used: 1 person hand held assist Transfers: Sit to/from Stand Sit to Stand: Min assist General transfer comment: cues for Ue use and attending to task. pt generally unsteady and reaches out for furniture to hold.  Ambulation/Gait Ambulation/Gait assistance: Min assist Ambulation Distance (Feet): 30 Feet Assistive device: 1 person hand held assist Gait Pattern/deviations: Step-through pattern, Decreased stride length, Narrow base of support Gait velocity: decreased Gait velocity interpretation: Below normal speed for age/gender General Gait Details: pt unsteady and requires consistent MinA to maintain balance. pt with narrow BOS and generally staggered gait. pt with decreased awareness of deficits and has increased difficulty attending to task during mobility.     ADL:    Cognition: Cognition Overall Cognitive Status: Impaired/Different from baseline Orientation Level: Oriented to person, Disoriented to time, Disoriented to situation, Oriented to place Cognition Arousal/Alertness: Awake/alert Behavior During Therapy: Impulsive Overall Cognitive Status: Impaired/Different from baseline Area of Impairment: Orientation,  Attention, Memory, Following commands, Safety/judgement, Awareness, Problem solving Orientation Level: Disoriented to, Time (pt able to state something is wrong with her brain. ) Current Attention Level: Focused Memory: Decreased short-term memory Following Commands: Follows one step commands consistently, Follows multi-step commands inconsistently Safety/Judgement: Decreased awareness of safety, Decreased awareness of deficits Awareness: Intellectual Problem Solving: Slow processing, Decreased initiation, Difficulty sequencing, Requires verbal cues, Requires tactile cues General Comments: pt with very short attention and easily distracted. pt gets irritated easily and needs gentle cueing along with distraction for participation. pt able to state she was in the hospital, but only knew something was wrong with her brain when asked about situation.   Blood pressure 125/56, pulse 73, temperature 97.9 F (36.6 C), temperature source Oral, resp. rate 19, height 5\' 3"  (1.6 m), weight 63.4 kg (139 lb 12.4 oz), SpO2 95 %. Physical Exam  Vitals reviewed. Constitutional: She appears well-developed.  HENT:  Head: Normocephalic.  Eyes: EOM are normal.  Neck: Normal range of motion. Neck supple. No thyromegaly present.  Cardiovascular: Normal rate and regular rhythm.  Respiratory: Effort normal and breath sounds normal. No respiratory distress.  GI: Soft. Bowel sounds are normal. She exhibits no distension.  Neurological: She is alert.  Patient is oriented to name, age and date of birth. She does follow simple commands. Did quite well cognitively during initial part of exam but became oppositional when discussing date. Began to confabulate about knowing my wife and knowing me before. Mild left sided dysmetria and ataxia. RUE: 5/5 Deltoid, 5/5 Biceps, 5/5 Triceps, 5/5 Wrist Ext, 5/5 Grip LUE: 5/5 Deltoid, 5/5 Biceps, 5/5 Triceps, 5/5 Wrist Ext, 5/5 Grip RLE: HF 5/5, KE 5/5, KF 5/5, ADF 5/5, APF  5/5 LLE: HF 5/5, KE 5/5, HF, 5/5, ADF 5/5, APF 5/5 No sensory  deficits  Skin: Skin is warm and dry.     Lab Results Last 24 Hours    Results for orders placed or performed during the hospital encounter of 03/02/14 (from the past 24 hour(s))  Magnesium Status: None   Collection Time: 03/08/14 2:53 AM  Result Value Ref Range   Magnesium 2.4 1.5 - 2.5 mg/dL  Phosphorus Status: Abnormal   Collection Time: 03/08/14 2:53 AM  Result Value Ref Range   Phosphorus 2.0 (L) 2.3 - 4.6 mg/dL  Basic metabolic panel Status: Abnormal   Collection Time: 03/08/14 2:53 AM  Result Value Ref Range   Sodium 141 135 - 145 mmol/L   Potassium 3.8 3.5 - 5.1 mmol/L   Chloride 112 96 - 112 mEq/L   CO2 24 19 - 32 mmol/L   Glucose, Bld 127 (H) 70 - 99 mg/dL   BUN 10 6 - 23 mg/dL   Creatinine, Ser 0.57 0.50 - 1.10 mg/dL   Calcium 7.5 (L) 8.4 - 10.5 mg/dL   GFR calc non Af Amer 87 (L) >90 mL/min   GFR calc Af Amer >90 >90 mL/min   Anion gap 5 5 - 15  CBC with Differential Status: Abnormal   Collection Time: 03/08/14 2:53 AM  Result Value Ref Range   WBC 11.3 (H) 4.0 - 10.5 K/uL   RBC 3.33 (L) 3.87 - 5.11 MIL/uL   Hemoglobin 10.4 (L) 12.0 - 15.0 g/dL   HCT 31.5 (L) 36.0 - 46.0 %   MCV 94.6 78.0 - 100.0 fL   MCH 31.2 26.0 - 34.0 pg   MCHC 33.0 30.0 - 36.0 g/dL   RDW 13.3 11.5 - 15.5 %   Platelets 244 150 - 400 K/uL   Neutrophils Relative % 89 (H) 43 - 77 %   Neutro Abs 10.0 (H) 1.7 - 7.7 K/uL   Lymphocytes Relative 4 (L) 12 - 46 %   Lymphs Abs 0.4 (L) 0.7 - 4.0 K/uL   Monocytes Relative 7 3 - 12 %   Monocytes Absolute 0.8 0.1 - 1.0 K/uL   Eosinophils Relative 0 0 - 5 %   Eosinophils Absolute 0.0 0.0 - 0.7 K/uL   Basophils Relative 0 0 - 1 %   Basophils Absolute 0.0 0.0 - 0.1 K/uL      Imaging Results (Last 48 hours)    No  results found.    Assessment/Plan: Diagnosis: right sided SAH, deconditioning 1. Does the need for close, 24 hr/day medical supervision in concert with the patient's rehab needs make it unreasonable for this patient to be served in a less intensive setting? Yes 2. Co-Morbidities requiring supervision/potential complications: altered mental status, acute encephalopathy 3. Due to bladder management, bowel management, safety, skin/wound care, disease management, medication administration, pain management and patient education, does the patient require 24 hr/day rehab nursing? Yes 4. Does the patient require coordinated care of a physician, rehab nurse, PT (1-2 hrs/day, 5 days/week), OT (1-2 hrs/day, 5 days/week) and SLP (1-2 hrs/day, 5 days/week) to address physical and functional deficits in the context of the above medical diagnosis(es)? Yes Addressing deficits in the following areas: balance, endurance, locomotion, strength, transferring, bowel/bladder control, bathing, dressing, feeding, grooming, toileting, cognition, speech, language, swallowing and psychosocial support 5. Can the patient actively participate in an intensive therapy program of at least 3 hrs of therapy per day at least 5 days per week? Yes 6. The potential for patient to make measurable gains while on inpatient rehab is good 7. Anticipated functional outcomes upon  discharge from inpatient rehab are supervision with PT, supervision with OT, supervision and min assist with SLP. 8. Estimated rehab length of stay to reach the above functional goals is: 8-12 days 9. Does the patient have adequate social supports and living environment to accommodate these discharge functional goals? Yes and Potentially 10. Anticipated D/C setting: Home 11. Anticipated post D/C treatments: HH therapy and Outpatient therapy 12. Overall Rehab/Functional Prognosis: excellent  RECOMMENDATIONS: This patient's condition is appropriate for continued  rehabilitative care in the following setting: CIR Patient has agreed to participate in recommended program. Yes/potentially Note that insurance prior authorization may be required for reimbursement for recommended care.  Comment: Rehab Admissions Coordinator to follow up.  Thanks,  Meredith Staggers, MD, [facility]     03/08/2014       Revision History     Date/Time User Provider Type Action   03/08/2014 2:34 PM Meredith Staggers, MD Physician Sign   03/08/2014 1:26 PM Cathlyn Parsons, PA-C Physician Assistant Pend   View Details Report       Routing History

## 2014-03-19 NOTE — Progress Notes (Signed)
Meredith Staggers, MD Physician Signed Physical Medicine and Rehabilitation Consult Note 03/08/2014 12:57 PM  Related encounter: ED to Hosp-Admission (Discharged) from 03/02/2014 in [place] ICU    Expand All Collapse All        Physical Medicine and Rehabilitation Consult Reason for Consult: [facility] Referring Physician: Critical care   HPI: Sharon [name] is a 80 y.o. right handed female with history of hyperlipidemia. Admitted 03/02/2014 with altered mental status. Patient independent prior to admission living with her significant other. By report a family patient had been experienced headache with weakness while at a recent dinner party. Initial cranial CT scan negative. MRI showed focal diffusion signal abnormality within the right sylvian fissure. 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 Lumbar puncture completed showing significant subarachnoid blood mixed with xanthochromic fluid. Interventional radiology consulted with cerebral arteriogram completed and underwent coiling of aneurysm 03/05/2014. Patient maintained on mechanical ventilation. Follow-up cranial CT scan showed no visible infarct or acute hemorrhage after coiling. Patient was extubated 03/06/2014. Patient with low-grade fever meningitis ruled out continued on broad-spectrum antibiotics. Decadron protocol as directed. Presently on a mechanical soft diet. Physical therapy evaluation completed with recommendations of physical medicine rehabilitation consult.   Review of Systems  Genitourinary: Positive for urgency.  Neurological: Positive for headaches.  Psychiatric/Behavioral: Positive for memory loss.  All other systems reviewed and are negative.  Past Medical History  Diagnosis Date  . Dyslipidemia   . Cystitis 03/02/2014    acute   . UTI (urinary tract infection) 03/02/2014  . Arthritis      osteo   Past Surgical History  Procedure Laterality Date  . Abdominal hysterectomy    . Radiology with anesthesia N/A 03/04/2014    Procedure: RADIOLOGY WITH ANESTHESIA; Surgeon: Medication Radiologist, MD; Location: [place]; Service: Radiology; Laterality: N/A;  . Radiology with anesthesia N/A 03/04/2014    Procedure: RADIOLOGY WITH ANESTHESIA; Surgeon: Rob Hickman, MD; Location: [place]; Service: Radiology; Laterality: N/A;   Family History  Problem Relation Age of Onset  . Hypertension Mother   . Hypertension Father    Social History:  reports that she has been smoking Cigarettes. She has been smoking about 0.50 packs per day. She has never used smokeless tobacco. She reports that she does not drink alcohol or use illicit drugs. Allergies: No Known Allergies Medications Prior to Admission  Medication Sig Dispense Refill  . aspirin EC 81 MG tablet Take 81 mg by mouth daily.    . cholecalciferol (VITAMIN D) 1000 UNITS tablet Take 1,000 Units by mouth daily.    Marland Kitchen ibuprofen (ADVIL,MOTRIN) 200 MG tablet Take 200 mg by mouth every 6 (six) hours as needed.    . Minoxidil (ROGAINE EX) Apply 1 application topically daily.    . naproxen sodium (ANAPROX) 220 MG tablet Take 220 mg by mouth 2 (two) times daily with a meal.    . Omega-3 Fatty Acids (FISH OIL PO) Take 1 tablet by mouth daily.    Marland Kitchen RENOVA 0.02 % CREA Apply 1 application topically daily.  3  . simvastatin (ZOCOR) 20 MG tablet Take 1 tablet by mouth daily.  6  . vitamin E 100 UNIT capsule Take 100 Units by mouth daily.    . ondansetron (ZOFRAN ODT) 4 MG disintegrating tablet Take 1 tablet (4 mg total) by mouth every 8 (eight) hours as needed for nausea or vomiting. 2 tablet 0    Home:  Home Living Family/patient expects to be discharged to:: Private residence Living Arrangements: Spouse/significant other Available Help at  Discharge: Family, Friend(s) Type of Home: House Home Access: Stairs to enter Technical brewer of Steps: 2 Home Layout: One level Additional Comments: patient poor historian secondary to confusion at this time, began getting frustrated when she could not answer question regarding railings  Functional History: Prior Function Level of Independence: Independent Comments: was going to yoga several times a week  Functional Status:  Mobility: Bed Mobility Overal bed mobility: Needs Assistance Bed Mobility: Supine to Sit Rolling: Supervision Supine to sit: Min assist Sit to supine: Min assist General bed mobility comments: cues fro attending to task and to slow down as pt a little impulsive.  Transfers Overall transfer level: Needs assistance Equipment used: 1 person hand held assist Transfers: Sit to/from Stand Sit to Stand: Min assist General transfer comment: cues for Ue use and attending to task. pt generally unsteady and reaches out for furniture to hold.  Ambulation/Gait Ambulation/Gait assistance: Min assist Ambulation Distance (Feet): 30 Feet Assistive device: 1 person hand held assist Gait Pattern/deviations: Step-through pattern, Decreased stride length, Narrow base of support Gait velocity: decreased Gait velocity interpretation: Below normal speed for age/gender General Gait Details: pt unsteady and requires consistent MinA to maintain balance. pt with narrow BOS and generally staggered gait. pt with decreased awareness of deficits and has increased difficulty attending to task during mobility.     ADL:    Cognition: Cognition Overall Cognitive Status: Impaired/Different from baseline Orientation Level: Oriented to person, Disoriented to time, Disoriented to situation, Oriented to place Cognition Arousal/Alertness: Awake/alert Behavior During Therapy: Impulsive Overall Cognitive Status: Impaired/Different from baseline Area of Impairment: Orientation,  Attention, Memory, Following commands, Safety/judgement, Awareness, Problem solving Orientation Level: Disoriented to, Time (pt able to state something is wrong with her brain. ) Current Attention Level: Focused Memory: Decreased short-term memory Following Commands: Follows one step commands consistently, Follows multi-step commands inconsistently Safety/Judgement: Decreased awareness of safety, Decreased awareness of deficits Awareness: Intellectual Problem Solving: Slow processing, Decreased initiation, Difficulty sequencing, Requires verbal cues, Requires tactile cues General Comments: pt with very short attention and easily distracted. pt gets irritated easily and needs gentle cueing along with distraction for participation. pt able to state she was in the hospital, but only knew something was wrong with her brain when asked about situation.   Blood pressure 125/56, pulse 73, temperature 97.9 F (36.6 C), temperature source Oral, resp. rate 19, height 5\' 3"  (1.6 m), weight 63.4 kg (139 lb 12.4 oz), SpO2 95 %. Physical Exam  Vitals reviewed. Constitutional: She appears well-developed.  HENT:  Head: Normocephalic.  Eyes: EOM are normal.  Neck: Normal range of motion. Neck supple. No thyromegaly present.  Cardiovascular: Normal rate and regular rhythm.  Respiratory: Effort normal and breath sounds normal. No respiratory distress.  GI: Soft. Bowel sounds are normal. She exhibits no distension.  Neurological: She is alert.  Patient is oriented to name, age and date of birth. She does follow simple commands. Did quite well cognitively during initial part of exam but became oppositional when discussing date. Began to confabulate about knowing my wife and knowing me before. Mild left sided dysmetria and ataxia. RUE: 5/5 Deltoid, 5/5 Biceps, 5/5 Triceps, 5/5 Wrist Ext, 5/5 Grip LUE: 5/5 Deltoid, 5/5 Biceps, 5/5 Triceps, 5/5 Wrist Ext, 5/5 Grip RLE: HF 5/5, KE 5/5, KF 5/5, ADF 5/5, APF  5/5 LLE: HF 5/5, KE 5/5, HF, 5/5, ADF 5/5, APF 5/5 No sensory  deficits  Skin: Skin is warm and dry.     Lab Results Last 24 Hours    Results for orders placed or performed during the hospital encounter of 03/02/14 (from the past 24 hour(s))  Magnesium Status: None   Collection Time: 03/08/14 2:53 AM  Result Value Ref Range   Magnesium 2.4 1.5 - 2.5 mg/dL  Phosphorus Status: Abnormal   Collection Time: 03/08/14 2:53 AM  Result Value Ref Range   Phosphorus 2.0 (L) 2.3 - 4.6 mg/dL  Basic metabolic panel Status: Abnormal   Collection Time: 03/08/14 2:53 AM  Result Value Ref Range   Sodium 141 135 - 145 mmol/L   Potassium 3.8 3.5 - 5.1 mmol/L   Chloride 112 96 - 112 mEq/L   CO2 24 19 - 32 mmol/L   Glucose, Bld 127 (H) 70 - 99 mg/dL   BUN 10 6 - 23 mg/dL   Creatinine, Ser 0.57 0.50 - 1.10 mg/dL   Calcium 7.5 (L) 8.4 - 10.5 mg/dL   GFR calc non Af Amer 87 (L) >90 mL/min   GFR calc Af Amer >90 >90 mL/min   Anion gap 5 5 - 15  CBC with Differential Status: Abnormal   Collection Time: 03/08/14 2:53 AM  Result Value Ref Range   WBC 11.3 (H) 4.0 - 10.5 K/uL   RBC 3.33 (L) 3.87 - 5.11 MIL/uL   Hemoglobin 10.4 (L) 12.0 - 15.0 g/dL   HCT 31.5 (L) 36.0 - 46.0 %   MCV 94.6 78.0 - 100.0 fL   MCH 31.2 26.0 - 34.0 pg   MCHC 33.0 30.0 - 36.0 g/dL   RDW 13.3 11.5 - 15.5 %   Platelets 244 150 - 400 K/uL   Neutrophils Relative % 89 (H) 43 - 77 %   Neutro Abs 10.0 (H) 1.7 - 7.7 K/uL   Lymphocytes Relative 4 (L) 12 - 46 %   Lymphs Abs 0.4 (L) 0.7 - 4.0 K/uL   Monocytes Relative 7 3 - 12 %   Monocytes Absolute 0.8 0.1 - 1.0 K/uL   Eosinophils Relative 0 0 - 5 %   Eosinophils Absolute 0.0 0.0 - 0.7 K/uL   Basophils Relative 0 0 - 1 %   Basophils Absolute 0.0 0.0 - 0.1 K/uL      Imaging Results (Last 48 hours)    No  results found.    Assessment/Plan: Diagnosis: right sided SAH, deconditioning 1. Does the need for close, 24 hr/day medical supervision in concert with the patient's rehab needs make it unreasonable for this patient to be served in a less intensive setting? Yes 2. Co-Morbidities requiring supervision/potential complications: altered mental status, acute encephalopathy 3. Due to bladder management, bowel management, safety, skin/wound care, disease management, medication administration, pain management and patient education, does the patient require 24 hr/day rehab nursing? Yes 4. Does the patient require coordinated care of a physician, rehab nurse, PT (1-2 hrs/day, 5 days/week), OT (1-2 hrs/day, 5 days/week) and SLP (1-2 hrs/day, 5 days/week) to address physical and functional deficits in the context of the above medical diagnosis(es)? Yes Addressing deficits in the following areas: balance, endurance, locomotion, strength, transferring, bowel/bladder control, bathing, dressing, feeding, grooming, toileting, cognition, speech, language, swallowing and psychosocial support 5. Can the patient actively participate in an intensive therapy program of at least 3 hrs of therapy per day at least 5 days per week? Yes 6. The potential for patient to make measurable gains while on inpatient rehab is good 7. Anticipated functional outcomes upon  discharge from inpatient rehab are supervision with PT, supervision with OT, supervision and min assist with SLP. 8. Estimated rehab length of stay to reach the above functional goals is: 8-12 days 9. Does the patient have adequate social supports and living environment to accommodate these discharge functional goals? Yes and Potentially 10. Anticipated D/C setting: Home 11. Anticipated post D/C treatments: HH therapy and Outpatient therapy 12. Overall Rehab/Functional Prognosis: excellent  RECOMMENDATIONS: This patient's condition is appropriate for continued  rehabilitative care in the following setting: CIR Patient has agreed to participate in recommended program. Yes/potentially Note that insurance prior authorization may be required for reimbursement for recommended care.  Comment: Rehab Admissions Coordinator to follow up.  Thanks,  Meredith Staggers, MD, [facility]     03/08/2014       Revision History     Date/Time User Provider Type Action   03/08/2014 2:34 PM Meredith Staggers, MD Physician Sign   03/08/2014 1:26 PM Cathlyn Parsons, PA-C Physician Assistant Pend   View Details Report       Routing History

## 2014-03-19 NOTE — Evaluation (Addendum)
Speech Language Pathology Assessment and Plan  Patient Details  Name: Sharon [name] MRN: [id number] Date of Birth: [birth year]  SLP Diagnosis: Cognitive Impairments;Dysphagia  Rehab Potential: Excellent ELOS: 12-14 days     Today's Date: 03/19/2014 SLP Individual Time: 1300-1400 SLP Individual Time Calculation (min): 60 min   Problem List:  Patient Active Problem List   Diagnosis Date Noted  . Hydrocephalus 03/18/2014  . Delirium   . Posterior communicating artery aneurysm   . Communicating hydrocephalus   . Urinary tract infection, acute   . HLD (hyperlipidemia)   . Altered mental status   . Subarachnoid hemorrhage 03/06/2014  . Acute respiratory failure   . SAH (subarachnoid hemorrhage)   . Aneurysm   . Meningitis   . Acute encephalopathy 03/02/2014  . Headache 03/02/2014  . UTI (lower urinary tract infection) 03/02/2014  . Dyslipidemia 03/02/2014   Past Medical History:  Past Medical History  Diagnosis Date  . Dyslipidemia   . Cystitis 03/02/2014    acute   . UTI (urinary tract infection) 03/02/2014  . Arthritis     osteo   Past Surgical History:  Past Surgical History  Procedure Laterality Date  . Abdominal hysterectomy    . Radiology with anesthesia N/A 03/04/2014    Procedure: RADIOLOGY WITH ANESTHESIA;  Surgeon: Medication Radiologist, MD;  Location: [place];  Service: Radiology;  Laterality: N/A;  . Radiology with anesthesia N/A 03/04/2014    Procedure: RADIOLOGY WITH ANESTHESIA;  Surgeon: Rob Hickman, MD;  Location: [place];  Service: Radiology;  Laterality: N/A;  . Ventriculoperitoneal shunt Right 03/15/2014    Procedure: SHUNT INSERTION VENTRICULAR-PERITONEAL;  Surgeon: Charlie Pitter, MD;  Location: [place] [facility];  Service: Neurosurgery;  Laterality: Right;    Assessment / Plan / Recommendation Clinical Impression Patient is a 80 y.o. right handed female with history of hyperlipidemia. Admitted 03/02/2014 with altered mental status. Patient  independent prior to admission living with her significant other. By report patient had been experiencing headaches with weakness while at a recent dinner party. Initial cranial CT scan negative. MRI showed focal diffusion signal abnormality within the right sylvian fissure. 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 Lumbar puncture completed showing significant subarachnoid blood mixed with xanthochromic fluid. Interventional radiology consulted with cerebral arteriogram completed and underwent coiling of aneurysm 03/05/2014. Patient maintained on mechanical ventilation. Follow-up cranial CT scan showed no visible infarct or acute hemorrhage after coiling. Patient was extubated 03/06/2014. Follow-up serial cranial CT scans showed increasing communicating hydrocephalus with patient having episodes of increasing confusion and incontinence. Follow-up neurosurgery underwent right occipital VP shunt 03/15/2014 per Dr.Pool. Patient with low-grade fever meningitis ruled out continued on broad-spectrum antibiotics and later discontinued. Presently on a mechanical soft diet. Physical therapy evaluation completed with recommendations of physical medicine rehabilitation consult. Patient was admitted for comprehensive rehabilitation program on 03/18/14 and was administered a cognitive-linguistic evaluation and BSE. Patient demonstrates moderate-severe cognitive impairments characterized by decreased sustained attention, intellectual awareness, safety awareness, problem solving and working memory with language of confusion and impulsivity which impacts the patient's ability to perform functional and familiar tasks safely. Patient also consumed lunch meal of Dys. 3 textures with thin liquids without overt s/s of aspiration, however, patient required Mod A multimodal cues for utilization of small bites and a slow rate of self-feeding due to impulsivity as well  cues for problem solving with tray set-up. Recommend patient continue current diet with full supervision to increase safety. Patient  would benefit from skilled SLP intervention to maximize her cognitive function in order to maximize her safety with meals and overall functional independence. Anticipate patient will require 24 hour supervision and f/u SLP services.   Skilled Therapeutic Interventions          Administered a cognitive-linguistic evaluation and BSE. Please see above for details.   SLP Assessment  Patient will need skilled Speech Lanaguage Pathology Services during CIR admission    Recommendations  Diet Recommendations: Dysphagia 3 (Mechanical Soft);Thin liquid Liquid Administration via: Straw;Cup Medication Administration: Whole meds with liquid Supervision: Patient able to self feed;Full supervision/cueing for compensatory strategies Compensations: Slow rate;Small sips/bites Postural Changes and/or Swallow Maneuvers: Seated upright 90 degrees;Upright 30-60 min after meal Oral Care Recommendations: Oral care BID Recommendations for Other Services: Neuropsych consult Patient destination: Home Follow up Recommendations: Home Health SLP;Outpatient SLP;24 hour supervision/assistance Equipment Recommended: None recommended by SLP    SLP Frequency 5 out of 7 days   SLP Treatment/Interventions Cognitive remediation/compensation;Cueing hierarchy;Dysphagia/aspiration precaution training;Environmental controls;Functional tasks;Internal/external aids;Patient/family education;Therapeutic Activities    Pain No/Denies Pain  Prior Functioning    Short Term Goals: Week 1: SLP Short Term Goal 1 (Week 1): Patient will demonstrate sustained attention to a functional task for 5 minutes with Mod A multimodal cues. SLP Short Term Goal 2 (Week 1): Patient will identify 2 physical and 2 cognitive deficits with Mod A multimodal cues.  SLP Short Term Goal 3 (Week 1): Patient will demonstrate  functional problem solving for functional and familiar tasks with Mod A multimodal cues with 50% of opportunities.  SLP Short Term Goal 4 (Week 1): Patient will utilize the call bell to request assistance with Max A multimodal cues with 25% of opportunities SLP Short Term Goal 5 (Week 1): Patient will utilize small bites and a slow rate of self-feeding to minimize aspiration risk with current diet with Min A multimodal cues.  SLP Short Term Goal 6 (Week 1): Patient will utilize external memory aids to orient to date and situation with Mod A multimodal cues.   See FIM for current functional status Refer to Care Plan for Long Term Goals  Recommendations for other services: Neuropsych  Discharge Criteria: Patient will be discharged from SLP if patient refuses treatment 3 consecutive times without medical reason, if treatment goals not met, if there is a change in medical status, if patient makes no progress towards goals or if patient is discharged from hospital.  The above assessment, treatment plan, treatment alternatives and goals were discussed and mutually agreed upon: by family  Kayann Maj 03/19/2014, 4:20 PM

## 2014-03-19 NOTE — IPOC Note (Signed)
Overall Plan of Care [facility]) Patient Details Name: Sharon [name] MRN: [id number] DOB: December 25, 1934  Admitting Diagnosis: R SAH  VP Shunt   Hospital Problems: Active Problems:   Hydrocephalus     Functional Problem List: Nursing Behavior, Bladder, Bowel, Pain, Safety, Skin Integrity, Medication Management  PT Balance, Behavior, Endurance, Motor, Perception, Safety, Other (comment), Pain, Sensory  OT Balance, Behavior, Cognition, Endurance, Motor, Pain, Safety, Perception  SLP Cognition  TR         Basic ADL's: OT Eating, Grooming, Bathing, Dressing, Toileting     Advanced  ADL's: OT Simple Meal Preparation     Transfers: PT Bed Mobility, Bed to Chair, Car, Furniture, Floor  OT Toilet, Tub/Shower     Locomotion: PT Stairs, Ambulation     Additional Impairments: OT None  SLP Swallowing, Social Cognition   Social Interaction, Problem Solving, Memory, Attention, Awareness  TR      Anticipated Outcomes Item Anticipated Outcome  Self Feeding independent  Swallowing  Supervision with regular textures and thin liquids    Basic self-care  supervision  Toileting  supervision   Bathroom Transfers supervision  Bowel/Bladder  Occasional incont of bowel and bladder.  LBM 03/18/2014  Transfers  Supervision  Locomotion  Supervision  Communication     Cognition  Min A   Pain  Pain level <4  Safety/Judgment  Max assist   Therapy Plan: PT Intensity: Minimum of 1-2 x/day ,45 to 90 minutes PT Frequency: 5 out of 7 days PT Duration Estimated Length of Stay: 12-14 days OT Intensity: Minimum of 1-2 x/day, 45 to 90 minutes OT Frequency: 5 out of 7 days OT Duration/Estimated Length of Stay: 12-14 days SLP Intensity: Minumum of 1-2 x/day, 30 to 90 minutes SLP Frequency: 5 out of 7 days SLP Duration/Estimated Length of Stay: 12-14 days        Team Interventions: Nursing Interventions Patient/Family Education, Bladder Management, Bowel Management, Pain Management,  Medication Management, Disease Management/Prevention, Skin Care/Wound Management  PT interventions Ambulation/gait training, Community reintegration, DME/adaptive equipment instruction, Neuromuscular re-education, Stair training, UE/LE Strength taining/ROM, UE/LE Coordination activities, Therapeutic Activities, Pain management, Discharge planning, Training and development officer, Cognitive remediation/compensation, Disease management/prevention, Functional mobility training, Patient/family education, Therapeutic Exercise, Visual/perceptual remediation/compensation  OT Interventions Training and development officer, Cognitive remediation/compensation, Community reintegration, Discharge planning, DME/adaptive equipment instruction, Functional mobility training, Patient/family education, Self Care/advanced ADL retraining, Therapeutic Activities, Therapeutic Exercise, UE/LE Strength taining/ROM  SLP Interventions Cognitive remediation/compensation, Cueing hierarchy, Dysphagia/aspiration precaution training, Environmental controls, Functional tasks, Internal/external aids, Patient/family education, Therapeutic Activities  TR Interventions    SW/CM Interventions Discharge Planning, Psychosocial Support, Patient/Family Education    Team Discharge Planning: Destination: PT-Home ,OT- Home , SLP-Home Projected Follow-up: PT-Home health PT, OT-  Home health OT, SLP-Home Health SLP, Outpatient SLP, 24 hour supervision/assistance Projected Equipment Needs: PT-To be determined, OT- To be determined, SLP-None recommended by SLP Equipment Details: PT- , OT-  Patient/family involved in discharge planning: PT- Patient,  OT-Patient, Family member/caregiver, SLP-Patient unable/family or caregive not available, Family member/caregiver  MD ELOS: 12-14 Medical Rehab Prognosis:  Good Assessment: The patient has been admitted for CIR therapies with the diagnosis of SAH complicated by hydrocephalus. The team will be addressing  functional mobility, strength, stamina, balance, safety, adaptive techniques and equipment, self-care, bowel and bladder mgt, patient and caregiver education, cognitive perceptual rx, sleep-wake restoration, behavior mgt, family support. Goals have been set at supervision with basic mobility and self-care and min to mod assist with cognition and safety judgement.    Meredith Staggers,  MD, [facility]      See Team Conference Notes for weekly updates to the plan of care

## 2014-03-19 NOTE — Plan of Care (Signed)
Problem: RH BLADDER ELIMINATION Goal: RH STG MANAGE BLADDER WITH ASSISTANCE STG Manage Bladder With Assistance. Mod I  Outcome: Progressing No incontinent episode

## 2014-03-19 NOTE — Evaluation (Signed)
Physical Therapy Assessment and Plan  Patient Details  Name: Sharon [name] MRN: [id number] Date of Birth: [birth year]  PT Diagnosis: Abnormality of gait, Dizziness and giddiness, Impaired cognition, Muscle weakness and Pain in head; Decreased balance, Decreased functional endurance Rehab Potential: Good ELOS: 12-14 days   Today's Date: 03/19/2014 PT Individual Time: 0800-0900 PT Individual Time Calculation (min): 60 min    Problem List:  Patient Active Problem List   Diagnosis Date Noted  . Hydrocephalus 03/18/2014  . Delirium   . Posterior communicating artery aneurysm   . Communicating hydrocephalus   . Urinary tract infection, acute   . HLD (hyperlipidemia)   . Altered mental status   . Subarachnoid hemorrhage 03/06/2014  . Acute respiratory failure   . SAH (subarachnoid hemorrhage)   . Aneurysm   . Meningitis   . Acute encephalopathy 03/02/2014  . Headache 03/02/2014  . UTI (lower urinary tract infection) 03/02/2014  . Dyslipidemia 03/02/2014    Past Medical History:  Past Medical History  Diagnosis Date  . Dyslipidemia   . Cystitis 03/02/2014    acute   . UTI (urinary tract infection) 03/02/2014  . Arthritis     osteo   Past Surgical History:  Past Surgical History  Procedure Laterality Date  . Abdominal hysterectomy    . Radiology with anesthesia N/A 03/04/2014    Procedure: RADIOLOGY WITH ANESTHESIA;  Surgeon: Medication Radiologist, MD;  Location: [facility];  Service: Radiology;  Laterality: N/A;  . Radiology with anesthesia N/A 03/04/2014    Procedure: RADIOLOGY WITH ANESTHESIA;  Surgeon: Rob Hickman, MD;  Location: [place];  Service: Radiology;  Laterality: N/A;  . Ventriculoperitoneal shunt Right 03/15/2014    Procedure: SHUNT INSERTION VENTRICULAR-PERITONEAL;  Surgeon: Charlie Pitter, MD;  Location: [place] [facility];  Service: Neurosurgery;  Laterality: Right;    Assessment & Plan Clinical Impression: Sharon [name] is a 80 y.o. right handed female  with history of hyperlipidemia. Admitted 03/02/2014 with altered mental status. Patient independent prior to admission living with her significant other. By report patient had been experienced headache with weakness while at a recent dinner party. Initial cranial CT scan negative. MRI showed focal diffusion signal abnormality within the right sylvian fissure. 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 Lumbar puncture completed showing significant subarachnoid blood mixed with xanthochromic fluid. Interventional radiology consulted with cerebral arteriogram completed and underwent coiling of aneurysm 03/05/2014. Patient maintained on mechanical ventilation. Follow-up cranial CT scan showed no visible infarct or acute hemorrhage after coiling. Patient was extubated 03/06/2014. Follow-up serial cranial CT scans showed increasing communicating hydrocephalus with patient having episodes of increasing confusion and incontinence. Follow-up neurosurgery underwent right occipital VP shunt 03/15/2014 per Dr.Pool. Patient with low-grade fever meningitis ruled out continued on broad-spectrum antibiotics and later discontinued. Decadron protocol as directed.Nimotop protocol for monitoring of blood pressure. Presently on a mechanical soft diet. Physical therapy evaluation completed with recommendations of physical medicine rehabilitation consult. Patient was admitted for comprehensive rehabilitation program.  Patient transferred to CIR on 03/18/2014 .   Patient currently requires min A with mobility secondary to muscle weakness, decreased cardiorespiratoy endurance, decreased motor planning, decreased attention to left, decreased initiation, decreased attention, decreased awareness, decreased problem solving, decreased safety awareness, decreased memory and delayed processing and decreased standing balance and decreased balance strategies.  Prior to  hospitalization, patient was independent  with mobility and lived with Significant other in a House home.  Home access is 2 (Pt  unable to clarify if railing)Stairs to enter.  Patient will benefit from skilled PT intervention to maximize safe functional mobility, minimize fall risk and decrease caregiver burden for planned discharge home with 24 hour supervision.  Anticipate patient will benefit from follow up Luke at discharge.  PT - End of Session Activity Tolerance: Tolerates 10 - 20 min activity with multiple rests Endurance Deficit: Yes Endurance Deficit Description: fatigue and intermittent dizziness PT Assessment Rehab Potential (ACUTE/IP ONLY): Good PT Patient demonstrates impairments in the following area(s): Balance;Behavior;Endurance;Motor;Perception;Safety;Other (comment);Pain;Sensory PT Transfers Functional Problem(s): Bed Mobility;Bed to Chair;Car;Furniture;Floor PT Locomotion Functional Problem(s): Stairs;Ambulation PT Plan PT Intensity: Minimum of 1-2 x/day ,45 to 90 minutes PT Frequency: 5 out of 7 days PT Duration Estimated Length of Stay: 12-14 days PT Treatment/Interventions: Ambulation/gait training;Community reintegration;DME/adaptive equipment instruction;Neuromuscular re-education;Stair training;UE/LE Strength taining/ROM;UE/LE Coordination activities;Therapeutic Activities;Pain management;Discharge planning;Balance/vestibular training;Cognitive remediation/compensation;Disease management/prevention;Functional mobility training;Patient/family education;Therapeutic Exercise;Visual/perceptual remediation/compensation PT Transfers Anticipated Outcome(s): Supervision PT Locomotion Anticipated Outcome(s): Supervision PT Recommendation Follow Up Recommendations: Home health PT Patient destination: Home Equipment Recommended: To be determined  Skilled Therapeutic Intervention 1:1. Pt received sitting in geri chair eating breakfast with family at side. Pt and family educated on  rehab environment, role of therapies, goals for physical therapy and general safety plan. Pt and family verbalized understanding, family departed immediately following verbal education. PT evaluation performed, see detailed objective information below. Tx session initiated with emphasis on safety during functional transfers and ambulation with min HHA >150'x4, L environmental awareness while looking for targets/locations on unit, intellectual/emergent awareness of impairments and formal assessment of balance with use of Berg Balance Test (see details below). Pt with intermittent reports of dizziness throughout session in standing, but decreasing with seated rest. Pt only reported increased pain in head 1x with prompted following observation of rubbing forehead. Pt able to accurately state how to call for assist from nursing with use of call button. Pt left sitting in geri chair asleep at end of session with quick release belt in place and all needs in reach. RN made aware of pt's current position, episodes of dizziness and headache.   PT Evaluation Precautions/Restrictions  General Chart Reviewed: Yes Family/Caregiver Present: Yes (Initially at start of session) Vital SignsTherapy Vitals BP: 110/62 mmHg Patient Position (if appropriate): Sitting Pain Pain Assessment Pain Assessment: Faces Faces Pain Scale: Hurts a little bit Pain Type: Acute pain Pain Location: Head Pain Descriptors / Indicators: Headache Pain Onset: On-going Pain Intervention(s): RN made aware;Rest Home Living/Prior Functioning Home Living Available Help at Discharge: Family;Friend(s);Available 24 hours/day Type of Home: House Home Access: Stairs to enter [employer] of Steps: 2 (Pt unable to clarify if has railing) Home Layout: One level  Lives With: Significant other Prior Function Level of Independence: Independent with basic ADLs;Independent with homemaking with ambulation;Independent with gait;Independent  with transfers  Able to Take Stairs?: Yes Driving: Yes Vocation: Full time employment Leisure: Hobbies-yes (Comment) Comments: Pt owns an Customer service manager, enjoys yoga and traveling Vision/Perception  See OT eval Cognition Overall Cognitive Status: Impaired/Different from baseline Arousal/Alertness: Awake/alert Orientation Level: Oriented to person;Oriented to place;Disoriented to time;Disoriented to situation Attention: Sustained Sustained Attention: Impaired Sustained Attention Impairment: Verbal basic;Functional basic Memory: Impaired Memory Impairment: Decreased recall of new information;Decreased short term memory Decreased Short Term Memory: Verbal basic Awareness: Impaired Awareness Impairment: Intellectual impairment;Emergent impairment Problem Solving: Impaired Problem Solving Impairment: Verbal basic;Functional basic Executive Function: Self Monitoring;Self Correcting;Decision Making;Reasoning;Organizing Reasoning: Impaired Reasoning Impairment: Verbal basic;Functional basic Organizing: Impaired Organizing Impairment: Verbal basic;Functional basic Decision Making: Impaired Decision  Making Impairment: Verbal basic;Functional basic Self Monitoring: Impaired Self Monitoring Impairment: Verbal basic;Functional basic Self Correcting: Impaired Self Correcting Impairment: Verbal basic;Functional basic Behaviors: Impulsive;Perseveration Safety/Judgment: Impaired Comments: going from sit to stand without warning Sensation Sensation Light Touch: Appears Intact Proprioception: Appears Intact;Impaired Detail Proprioception Impaired Details: Impaired RLE Coordination Gross Motor Movements are Fluid and Coordinated: Yes Fine Motor Movements are Fluid and Coordinated: Yes Motor  Motor Motor: Other (comment) (motor planning) Motor - Skilled Clinical Observations: Pt req cues for seq and initiation during toileting and washing hands at sink  Mobility Bed Mobility Bed Mobility:  Sit to Supine;Supine to Sit Supine to Sit: 4: Min assist Supine to Sit Details: Verbal cues for precautions/safety;Tactile cues for initiation Sit to Supine: 5: Supervision Sit to Supine - Details: Verbal cues for precautions/safety;Tactile cues for initiation;Verbal cues for sequencing Transfers Transfers: Yes Sit to Stand: 4: Min assist;With armrests Sit to Stand Details: Manual facilitation for weight shifting;Verbal cues for precautions/safety;Verbal cues for technique Stand to Sit: 4: Min assist;With armrests Stand to Sit Details (indicate cue type and reason): Verbal cues for precautions/safety;Manual facilitation for weight shifting;Verbal cues for technique Stand Pivot Transfers: 4: Min assist Stand Pivot Transfer Details: Verbal cues for precautions/safety;Verbal cues for technique;Manual facilitation for weight shifting;Verbal cues for sequencing Locomotion  Ambulation Ambulation: Yes Ambulation/Gait Assistance: 4: Min assist Ambulation Distance (Feet): 200 Feet Assistive device: 1 person hand held assist Ambulation/Gait Assistance Details: Manual facilitation for weight shifting;Verbal cues for precautions/safety Gait Gait: Yes Gait Pattern: Impaired Gait Pattern: Narrow base of support;Step-through pattern;Decreased step length - left;Decreased dorsiflexion - left Stairs / Additional Locomotion Stairs: Yes Stairs Assistance: 4: Min assist Stairs Assistance Details: Verbal cues for precautions/safety;Verbal cues for technique;Verbal cues for sequencing Stair Management Technique: Alternating pattern;Step to pattern;Two rails;One rail Left;Forwards Number of Stairs: 12 Wheelchair Mobility Wheelchair Mobility: No (Pt at functional ambulatory level)  Trunk/Postural Assessment  Cervical Assessment Cervical Assessment: Within Functional Limits Thoracic Assessment Thoracic Assessment: Within Functional Limits Lumbar Assessment Lumbar Assessment: Within Functional  Limits Postural Control Postural Control: Deficits on evaluation Righting Reactions: delayd in standing, especially to L  Balance Balance Balance Assessed: Yes Standardized Balance Assessment Standardized Balance Assessment: Berg Balance Test Berg Balance Test Sit to Stand: Able to stand  independently using hands Standing Unsupported: Needs several tries to stand 30 seconds unsupported Sitting with Back Unsupported but Feet Supported on Floor or Stool: Able to sit 2 minutes under supervision Stand to Sit: Sits safely with minimal use of hands Transfers: Able to transfer with verbal cueing and /or supervision Standing Unsupported with Eyes Closed: Needs help to keep from falling Standing Ubsupported with Feet Together: Needs help to attain position and unable to hold for 15 seconds From Standing, Reach Forward with Outstretched Arm: Reaches forward but needs supervision From Standing Position, Pick up Object from Floor: Able to pick up shoe, needs supervision From Standing Position, Turn to Look Behind Over each Shoulder: Needs assist to keep from losing balance and falling Turn 360 Degrees: Needs assistance while turning Standing Unsupported, Alternately Place Feet on Step/Stool: Able to complete >2 steps/needs minimal assist Standing Unsupported, One Foot in Front: Needs help to step but can hold 15 seconds Standing on One Leg: Unable to try or needs assist to prevent fall Total Score: 19 Static Sitting Balance Static Sitting - Balance Support: Feet supported;No upper extremity supported Static Sitting - Level of Assistance: 5: Stand by assistance Dynamic Sitting Balance Dynamic Sitting - Balance Support: Feet supported;Right upper extremity supported;Left upper  extremity supported;During functional activity Dynamic Sitting - Level of Assistance: 5: Stand by assistance Dynamic Sitting - Balance Activities: Lateral lean/weight shifting;Reaching for objects;Reaching across  midline;Forward lean/weight shifting Static Standing Balance Static Standing - Balance Support: No upper extremity supported Static Standing - Level of Assistance: 4: Min assist Dynamic Standing Balance Dynamic Standing - Balance Support: Left upper extremity supported;Right upper extremity supported Dynamic Standing - Level of Assistance: 4: Min assist Dynamic Standing - Balance Activities: Forward lean/weight shifting;Lateral lean/weight shifting;Reaching for objects Extremity Assessment  RUE Assessment RUE Assessment: Within Functional Limits LUE Assessment LUE Assessment: Within Functional Limits RLE Assessment RLE Assessment: Within Functional Limits RLE Strength RLE Overall Strength Comments: Hip flexion: 4/5; knee flex/ext and DF/PF: 4+/5 LLE Assessment LLE Assessment: Within Functional Limits LLE Strength LLE Overall Strength Comments: Hip flexion: 4/5; knee flex/ext and DF/PF: 4+/5  FIM:  FIM - Bed/Chair Transfer Bed/Chair Transfer Assistive Devices: Arm rests Bed/Chair Transfer: 4: Supine > Sit: Min A (steadying Pt. > 75%/lift 1 leg);5: Sit > Supine: Supervision (verbal cues/safety issues);4: Bed > Chair or W/C: Min A (steadying Pt. > 75%);4: Chair or W/C > Bed: Min A (steadying Pt. > 75%) FIM - Locomotion: Wheelchair Locomotion: Wheelchair: 0: Activity did not occur (Pt at functional ambulatory level) FIM - Locomotion: Ambulation Locomotion: Ambulation Assistive Devices: Administrator Ambulation/Gait Assistance: 4: Min assist Locomotion: Ambulation: 4: Travels 150 ft or more with minimal assistance (Pt.>75%) FIM - Locomotion: Stairs Locomotion: Scientist, physiological: Insurance account manager - 1;Hand rail - 2 Locomotion: Stairs: 4: Up and Down 12 - 14 stairs with minimal assistance (Pt.>75%)   Refer to Care Plan for Long Term Goals  Recommendations for other services: None  Discharge Criteria: Patient will be discharged from PT if patient refuses treatment 3 consecutive  times without medical reason, if treatment goals not met, if there is a change in medical status, if patient makes no progress towards goals or if patient is discharged from hospital.  The above assessment, treatment plan, treatment alternatives and goals were discussed and mutually agreed upon: by patient and by family  Mckena, Chern 03/19/2014, 6:39 PM

## 2014-03-19 NOTE — Progress Notes (Signed)
Patient information reviewed and entered into e[facility] system by Ido Wollman, RN, CRRN, PPS Coordinator.  Information including medical coding and functional independence measure will be reviewed and updated through discharge.     Per nursing patient was given "Data Collection Information Summary for Patients in Inpatient Rehabilitation Facilities with attached "Privacy Act Statement-Health Care Records" upon admission.  

## 2014-03-19 NOTE — Progress Notes (Signed)
Occupational Therapy Assessment and Plan  Patient Details  Name: Sharon [name] MRN: [id number] Date of Birth: 25-Jun-1934  OT Diagnosis: altered mental status, cognitive deficits and muscle weakness (generalized) Rehab Potential: Rehab Potential (ACUTE ONLY): Good ELOS: 12-14 days   Today's Date: 03/19/2014 OT Individual Time: 1030-1150 OT Individual Time Calculation (min): 80 min     Problem List:  Patient Active Problem List   Diagnosis Date Noted  . Hydrocephalus 03/18/2014  . Delirium   . Posterior communicating artery aneurysm   . Communicating hydrocephalus   . Urinary tract infection, acute   . HLD (hyperlipidemia)   . Altered mental status   . Subarachnoid hemorrhage 03/06/2014  . Acute respiratory failure   . SAH (subarachnoid hemorrhage)   . Aneurysm   . Meningitis   . Acute encephalopathy 03/02/2014  . Headache 03/02/2014  . UTI (lower urinary tract infection) 03/02/2014  . Dyslipidemia 03/02/2014    Past Medical History:  Past Medical History  Diagnosis Date  . Dyslipidemia   . Cystitis 03/02/2014    acute   . UTI (urinary tract infection) 03/02/2014  . Arthritis     osteo   Past Surgical History:  Past Surgical History  Procedure Laterality Date  . Abdominal hysterectomy    . Radiology with anesthesia N/A 03/04/2014    Procedure: RADIOLOGY WITH ANESTHESIA;  Surgeon: Medication Radiologist, MD;  Location: [place];  Service: Radiology;  Laterality: N/A;  . Radiology with anesthesia N/A 03/04/2014    Procedure: RADIOLOGY WITH ANESTHESIA;  Surgeon: Rob Hickman, MD;  Location: [place];  Service: Radiology;  Laterality: N/A;  . Ventriculoperitoneal shunt Right 03/15/2014    Procedure: SHUNT INSERTION VENTRICULAR-PERITONEAL;  Surgeon: Charlie Pitter, MD;  Location: [facility];  Service: Neurosurgery;  Laterality: Right;    Assessment & Plan Clinical Impression: HPI: Sharon [name] is a 80 y.o. right handed female with history of hyperlipidemia.  Admitted 03/02/2014 with altered mental status. Patient independent prior to admission living with her significant other. By report patient had been experienced headache with weakness while at a recent dinner party. Initial cranial CT scan negative. MRI showed focal diffusion signal abnormality within the right sylvian fissure. 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 Lumbar puncture completed showing significant subarachnoid blood mixed with xanthochromic fluid. Interventional radiology consulted with cerebral arteriogram completed and underwent coiling of aneurysm 03/05/2014. Patient maintained on mechanical ventilation. Follow-up cranial CT scan showed no visible infarct or acute hemorrhage after coiling. Patient was extubated 03/06/2014. Follow-up serial cranial CT scans showed increasing communicating hydrocephalus with patient having episodes of increasing confusion and incontinence. Follow-up neurosurgery underwent right occipital VP shunt 03/15/2014 per Dr.Pool. Patient with low-grade fever meningitis ruled out continued on broad-spectrum antibiotics and later discontinued. Decadron protocol as directed.Nimotop protocol for monitoring of blood pressure. Presently on a mechanical soft diet Patient transferred to CIR on 03/18/2014 .    Patient currently requires min with basic self-care skills secondary to decreased attention to left and decreased motor planning and decreased initiation, decreased attention, decreased awareness, decreased problem solving, decreased safety awareness, decreased memory and delayed processing.  Prior to hospitalization, patient could complete BADL/IADL with independent .  Patient will benefit from skilled intervention to increase independence with basic self-care skills prior to discharge home with care partner.  Anticipate patient will require 24 hour supervision and follow up home health.  OT - End of  Session Activity Tolerance: Tolerates 10 - 20  min activity with multiple rests Endurance Deficit: Yes OT Assessment Rehab Potential (ACUTE ONLY): Good Barriers to Discharge:  (none) OT Plan OT Intensity: Minimum of 1-2 x/day, 45 to 90 minutes OT Frequency: 5 out of 7 days OT Duration/Estimated Length of Stay: 12-14 days OT Treatment/Interventions: Balance/vestibular training;Cognitive remediation/compensation;Community reintegration;Discharge planning;DME/adaptive equipment instruction;Functional mobility training;Patient/family education;Self Care/advanced ADL retraining;Therapeutic Activities;Therapeutic Exercise;UE/LE Strength taining/ROM OT Self Feeding Anticipated Outcome(s): independent OT Basic Self-Care Anticipated Outcome(s): supervision OT Toileting Anticipated Outcome(s): supervision OT Bathroom Transfers Anticipated Outcome(s): supervision OT Recommendation Patient destination: Home Follow Up Recommendations: Home health OT Equipment Recommended: To be determined   Skilled Therapeutic Intervention Pt. Seen for OT for initial evaluation with focus on bathing and dressing.  Pt. Needed 15 minutes to arouse from sleep.  She ambulated to shower.  Was very quick and implosive with reaching for things.  Pt. Needed max cues to follow one step directions.  She perseverated on various objects during session and needed max assist for redirection.    OT Evaluation Precautions/Restrictions  Precautions Precautions: Fall Precaution Comments:  (impulsive, object perserveration) Restrictions Weight Bearing Restrictions: No       Pain  none   Home Living/Prior Functioning Home Living Available Help at Discharge: Family, Friend(s), Available 24 hours/day Type of Home: House Home Access: Stairs to enter [employer] of Steps: 2 Home Layout: One level Additional Comments: Pt owns an Customer service manager.  Lives With: Significant other IADL History Homemaking  Responsibilities: Yes Meal Prep Responsibility: Primary (does microwave and and oven use) Laundry Responsibility: Secondary Cleaning Responsibility: No Bill Paying/Finance Responsibility: Secondary Shopping Responsibility: Secondary Child Care Responsibility: No Current License: Yes Mode of Transportation: Car Occupation: Full time employment (has own Customer service manager ) Leisure and Office manager:  (reads, Yoga, Customer service manager) Prior Function Level of Independence: Independent with basic ADLs, Independent with homemaking with ambulation, Independent with gait, Independent with transfers  Able to Take Stairs?: Yes Driving: Yes Vocation: Full time employment Leisure: Hobbies-yes (Comment) Comments: Pt owns an Customer service manager, enjoys yoga and traveling ADL   Vision/Perception  Vision- Assessment Eye Alignment: Within Functional Limits Perception Perception: Impaired Praxis Praxis: Not tested  Cognition Overall Cognitive Status: Impaired/Different from baseline Arousal/Alertness: Awake/alert Orientation Level: Oriented to person;Oriented to place;Disoriented to time;Disoriented to situation Attention: Sustained Sustained Attention: Impaired Sustained Attention Impairment: Verbal basic;Functional basic Memory: Impaired Memory Impairment: Decreased recall of new information;Decreased short term memory Decreased Short Term Memory: Verbal basic Awareness: Impaired Awareness Impairment: Intellectual impairment;Emergent impairment Problem Solving: Impaired Problem Solving Impairment: Verbal basic;Functional basic Executive Function: Self Monitoring;Self Correcting;Decision Making;Reasoning;Organizing Reasoning: Impaired Reasoning Impairment: Verbal basic;Functional basic Organizing: Impaired Organizing Impairment: Verbal basic;Functional basic Decision Making: Impaired Decision Making Impairment: Verbal basic;Functional basic Self Monitoring: Impaired Self Monitoring Impairment: Verbal  basic;Functional basic Self Correcting: Impaired Self Correcting Impairment: Verbal basic;Functional basic Behaviors: Impulsive;Perseveration Safety/Judgment: Impaired Comments: going from sit to stand without warning [employer] Scales of Cognitive Functioning: Confused/inappropriate/non-agitated Sensation Sensation Light Touch: Appears Intact Proprioception: Appears Intact Coordination Gross Motor Movements are Fluid and Coordinated: Yes Fine Motor Movements are Fluid and Coordinated: Yes Motor  Motor Motor: Motor perseverations;Motor impersistence;Abnormal postural alignment and control Motor - Skilled Clinical Observations: Pt req cues for seq and initiation during toileting and washing hands at sink Mobility  Bed Mobility Bed Mobility: Sit to Supine;Supine to Sit Supine to Sit: 4: Min assist Supine to Sit Details: Verbal cues for precautions/safety;Tactile cues for initiation Sit to Supine: 5: Supervision Sit to Supine - Details: Verbal cues for precautions/safety;Tactile cues  for initiation;Verbal cues for sequencing Transfers Sit to Stand: 4: Min assist;With armrests Sit to Stand Details: Manual facilitation for weight shifting;Verbal cues for precautions/safety;Verbal cues for technique Stand to Sit: 4: Min assist;With armrests Stand to Sit Details (indicate cue type and reason): Verbal cues for precautions/safety;Manual facilitation for weight shifting;Verbal cues for technique  Trunk/Postural Assessment  Cervical Assessment Cervical Assessment: Within Functional Limits Thoracic Assessment Thoracic Assessment: Within Functional Limits Lumbar Assessment Lumbar Assessment: Within Functional Limits Postural Control Postural Control: Deficits on evaluation Trunk Control:  (mild leaning to the left in shower) Righting Reactions:  (delayed)  Balance Balance Balance Assessed: Yes Static Sitting Balance Static Sitting - Balance Support: Feet supported Static  Sitting - Level of Assistance: 4: Min assist Dynamic Sitting Balance Dynamic Sitting - Balance Support: Feet supported;Right upper extremity supported;Left upper extremity supported;During functional activity Dynamic Sitting - Level of Assistance: 5: Stand by assistance Dynamic Sitting - Balance Activities: Lateral lean/weight shifting;Reaching for objects;Reaching across midline;Forward lean/weight shifting Static Standing Balance Static Standing - Balance Support: No upper extremity supported Static Standing - Level of Assistance: 4: Min assist Dynamic Standing Balance Dynamic Standing - Level of Assistance: 4: Min assist Dynamic Standing - Balance Activities: Reaching for objects;Reaching across midline;Forward lean/weight shifting;Lateral lean/weight shifting Dynamic Standing - Comments: moves quickly with no verbal indication Extremity/Trunk Assessment RUE Assessment RUE Assessment: Within Functional Limits LUE Assessment LUE Assessment: Within Functional Limits  FIM:  FIM - Grooming Grooming Steps: Wash, rinse, dry face;Wash, rinse, dry hands Grooming: 4: Patient completes 3 of 4 or 4 of 5 steps FIM - Bathing Bathing Steps Patient Completed: Chest;Right Arm;Left Arm;Abdomen;Front perineal area;Buttocks;Right upper leg;Left upper leg Bathing: 4: Min-Patient completes 8-9 [id number] parts or 75+ percent FIM - Upper Body Dressing/Undressing Upper body dressing/undressing steps patient completed: Thread/unthread left bra strap;Thread/unthread right bra strap;Hook/unhook bra;Thread/unthread right sleeve of pullover shirt/dresss;Thread/unthread left sleeve of pullover shirt/dress;Put head through opening of pull over shirt/dress;Pull shirt over trunk Upper body dressing/undressing: 4: Steadying assist FIM - Lower Body Dressing/Undressing Lower body dressing/undressing steps patient completed: Thread/unthread right underwear leg;Thread/unthread left underwear leg;Pull underwear  up/down;Thread/unthread right pants leg;Thread/unthread left pants leg;Pull pants up/down FIM - Toileting Toileting steps completed by patient: Adjust clothing prior to toileting;Performs perineal hygiene;Adjust clothing after toileting Toileting Assistive Devices: Grab bar or rail for support Toileting: 4: Steadying assist FIM - BControl and instrumentation engineerDevices: Arm rests Bed/Chair Transfer: 5: Supine > Sit: Supervision (verbal cues/safety issues);4: Bed > Chair or W/C: Min A (steadying Pt. > 75%) FIM - TRadio producerDevices: Grab bars Toilet Transfers: 4-From toilet/BSC: Min A (steadying Pt. > 75%);4-To toilet/BSC: Min A (steadying Pt. > 75%) FIM - Tub/Shower Transfers Tub/Shower Assistive Devices: Tub transfer bench;Grab bars;Walk in shower Tub/shower Transfers: 4-Into Tub/Shower: Min A (steadying Pt. > 75%/lift 1 leg);4-Out of Tub/Shower: Min A (steadying Pt. > 75%/lift 1 leg)   Refer to Care Plan for Long Term Goals  Recommendations for other services: None  Discharge Criteria: Patient will be discharged from OT if patient refuses treatment 3 consecutive times without medical reason, if treatment goals not met, if there is a change in medical status, if patient makes no progress towards goals or if patient is discharged from hospital.  The above assessment, treatment plan, treatment alternatives and goals were discussed and mutually agreed upon: by patient and by family  ELisa Roca1/15/2016, 6:20 PM

## 2014-03-20 ENCOUNTER — Inpatient Hospital Stay (HOSPITAL_COMMUNITY): Payer: Medicare Other | Admitting: Occupational Therapy

## 2014-03-20 ENCOUNTER — Inpatient Hospital Stay (HOSPITAL_COMMUNITY): Payer: Medicare Other | Admitting: Speech Pathology

## 2014-03-20 ENCOUNTER — Inpatient Hospital Stay (HOSPITAL_COMMUNITY): Payer: Medicare Other

## 2014-03-20 ENCOUNTER — Inpatient Hospital Stay (HOSPITAL_COMMUNITY): Payer: Medicare Other | Admitting: *Deleted

## 2014-03-20 LAB — BASIC METABOLIC PANEL
ANION GAP: 7 (ref 5–15)
BUN: 13 mg/dL (ref 6–23)
CHLORIDE: 97 meq/L (ref 96–112)
CO2: 31 mmol/L (ref 19–32)
CREATININE: 0.62 mg/dL (ref 0.50–1.10)
Calcium: 8.9 mg/dL (ref 8.4–10.5)
GFR, EST NON AFRICAN AMERICAN: 85 mL/min — AB (ref >90)
GLUCOSE: 110 mg/dL — AB (ref 70–99)
Potassium: 4.2 mmol/L (ref 3.5–5.1)
Sodium: 135 mmol/L (ref 135–145)

## 2014-03-20 LAB — CBC WITH DIFFERENTIAL/PLATELET
Basophils Absolute: 0 10*3/uL (ref 0.0–0.1)
Basophils Relative: 0 % (ref 0–1)
EOS ABS: 0.1 10*3/uL (ref 0.0–0.7)
Eosinophils Relative: 1 % (ref 0–5)
HEMATOCRIT: 37.6 % (ref 36.0–46.0)
HEMOGLOBIN: 12.5 g/dL (ref 12.0–15.0)
Lymphocytes Relative: 10 % — ABNORMAL LOW (ref 12–46)
Lymphs Abs: 1 10*3/uL (ref 0.7–4.0)
MCH: 32.4 pg (ref 26.0–34.0)
MCHC: 33.2 g/dL (ref 30.0–36.0)
MCV: 97.4 fL (ref 78.0–100.0)
MONO ABS: 0.7 10*3/uL (ref 0.1–1.0)
Monocytes Relative: 7 % (ref 3–12)
NEUTROS PCT: 82 % — AB (ref 43–77)
Neutro Abs: 8 10*3/uL — ABNORMAL HIGH (ref 1.7–7.7)
Platelets: 246 10*3/uL (ref 150–400)
RBC: 3.86 MIL/uL — AB (ref 3.87–5.11)
RDW: 14.2 % (ref 11.5–15.5)
WBC: 9.7 10*3/uL (ref 4.0–10.5)

## 2014-03-20 NOTE — Progress Notes (Signed)
Patient observed to be lethargic. Seroquel 25 mg given at 2002 last night, 01/15. Per family, patient is more confuse. Concern about her altered mental status. Dr. Burnice Logan made aware. Head CT without contrast, CBC with Diff and BMET ordered. Seroquel discontinued. Dr. Raliegh Ip. notified of CT scan result. Tonya Carlile A. Janell Keeling, RN

## 2014-03-20 NOTE — Progress Notes (Signed)
No safety sitter available from 0300-0700 per staffing. Will continue to monitor pt. Currently sleeping. Kennieth Francois, RN

## 2014-03-20 NOTE — Progress Notes (Addendum)
Social Work  Social Work Assessment and Plan  Patient Details  Name: Sharon [name] MRN: [id number] Date of Birth: [birth year]  Today's Date: 03/20/2014  Problem List:  Patient Active Problem List   Diagnosis Date Noted  . Hydrocephalus 03/18/2014  . Delirium   . Posterior communicating artery aneurysm   . Communicating hydrocephalus   . Urinary tract infection, acute   . HLD (hyperlipidemia)   . Altered mental status   . Subarachnoid hemorrhage 03/06/2014  . Acute respiratory failure   . SAH (subarachnoid hemorrhage)   . Aneurysm   . Meningitis   . Acute encephalopathy 03/02/2014  . Headache 03/02/2014  . UTI (lower urinary tract infection) 03/02/2014  . Dyslipidemia 03/02/2014   Past Medical History:  Past Medical History  Diagnosis Date  . Dyslipidemia   . Cystitis 03/02/2014    acute   . UTI (urinary tract infection) 03/02/2014  . Arthritis     osteo   Past Surgical History:  Past Surgical History  Procedure Laterality Date  . Abdominal hysterectomy    . Radiology with anesthesia N/A 03/04/2014    Procedure: RADIOLOGY WITH ANESTHESIA;  Surgeon: Medication Radiologist, MD;  Location: [place];  Service: Radiology;  Laterality: N/A;  . Radiology with anesthesia N/A 03/04/2014    Procedure: RADIOLOGY WITH ANESTHESIA;  Surgeon: Rob Hickman, MD;  Location: [place];  Service: Radiology;  Laterality: N/A;  . Ventriculoperitoneal shunt Right 03/15/2014    Procedure: SHUNT INSERTION VENTRICULAR-PERITONEAL;  Surgeon: Charlie Pitter, MD;  Location: [place] [facility];  Service: Neurosurgery;  Laterality: Right;   Social History:  reports that she has been smoking Cigarettes.  She has been smoking about 0.50 packs per day. She has never used smokeless tobacco. She reports that she does not drink alcohol or use illicit drugs.  Family / Support Systems Marital Status: Widow/Widower Patient Roles: Parent, Partner, Other (Comment) (business owner,  significant other x 11  yrs) Spouse/Significant Other: borfriend x 11 yrs, Maryelizabeth Rowan @ (C) [phone] Children: daughter, Kaila Devries @ (C) [phone] [facility]) ; daughter, Leda Roys @ (C) [phone] [facility]) ; son, Corene Resnick @ (C) [phone] [place], [place]) and one other daughter, Missy Engineer, drilling) Anticipated Caregiver: Daughter Maudie Mercury) does not anticipate Ole providing any support beyond just companionship.  Family to arrange private duty assist as needed. Ability/Limitations of Caregiver: No family living locally except a grandson, Ben Cochrane, who is operating pt's businees Caregiver Availability: 24/7 Family Dynamics: All family very supportive with Maudie Mercury and Gershon Mussel being primary decisions makers at this time.  Daughter, Missy, with h/o strokes herself and with cognitive deficits/ unable to assist.  Social History Preferred language: English Religion: Presbyterian Cultural Background: NA Education: college Read: Yes Write: Yes Employment Status: Employed Name of Employer: Owner of [employer] Return to Work Plans: dependent on her cogntivie recovery. Legal Hisotry/Current Legal Issues: None Guardian/Conservator: None - per MD, pt not capable of making desicions on her own behalf - defer to eldest daughter, Maudie Mercury  Abuse/Neglect Physical Abuse: Denies Verbal Abuse: Denies Sexual Abuse: Denies Exploitation of patient/patient's resources: Denies Self-Neglect: Denies  Emotional Status Pt's affect, behavior adn adjustment status: Pt with significant cognitive impairment and only truly oriented to person.  She is becoming agitiated at night per staff and not sleeping well.  Cannot assess emotional adjustment at this point.  Will monitor and will refer to neuropsychology when appropriate. Recent Psychosocial Issues: None Pyschiatric History: None Substance Abuse History: None  Patient / Family Perceptions, Expectations &  Goals Pt/Family understanding of illness & functional  limitations: Pt has moments when she will make statements regarding "my head", however, not fully oriented.  Family with good understanding of her medical conditions, current limitations and purpose of CIR. Premorbid pt/family roles/activities: Pt was completely independent and running her business. Traveling often. Anticipated changes in roles/activities/participation: Pt will likely require 24/7 supervision with family or private duty caregivers Pt/family expectations/goals: Family hopeful she will make good cognitive recovery, however, admit they are unsure what to expect.  Community Resources [employer] Scripts: None Premorbid Home Care/DME Agencies: None Transportation available at discharge: yes Resource referrals recommended: Neuropsychology, Support group (specify)  Discharge Planning Living Arrangements: Spouse/significant other Support Systems: Spouse/significant other, Children, Water engineer, Social worker community Type of Residence: Private residence Insurance underwriter Resources: [employer], Multimedia programmer (specify) ([place]) Pensions consultant: Employment, Radio broadcast assistant Screen Referred: No Living Expenses: Own Money Management: Patient Does the patient have any problems obtaining your medications?: No Home Management: pt Patient/Family Preliminary Plans: Daughter reports plans for pt to return to her own home with family and probable private duty care providing 24/7 care. Social Work Anticipated Follow Up Needs: HH/OP Expected length of stay: 10 days  Clinical Impression Unfortunate woman here following aneurysm rupture.  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  Good family support, however, only a grandson living locally.  Anticipate that cognitive deficits to be primary barrier to regaining independence. Will follow for support and d/c planning needs.  Loy Little 03/20/2014, 2:06 PM

## 2014-03-20 NOTE — IPOC Note (Signed)
Overall Plan of Care [facility]) Patient Details Name: Sharon [name] MRN: [id number] DOB: [birth year]  Admitting Diagnosis: R SAH  VP Shunt   Hospital Problems: Active Problems:   Hydrocephalus     Functional Problem List: Nursing Behavior, Bladder, Bowel, Pain, Safety, Skin Integrity, Medication Management  PT Balance, Behavior, Endurance, Motor, Perception, Safety, Other (comment), Pain, Sensory  OT Balance, Behavior, Cognition, Endurance, Motor, Pain, Safety, Perception  SLP Cognition  TR         Basic ADL's: OT Eating, Grooming, Bathing, Dressing, Toileting     Advanced  ADL's: OT Simple Meal Preparation     Transfers: PT Bed Mobility, Bed to Chair, Car, Furniture, Floor  OT Toilet, Tub/Shower     Locomotion: PT Stairs, Ambulation     Additional Impairments: OT None  SLP Swallowing, Social Cognition   Social Interaction, Problem Solving, Memory, Attention, Awareness  TR      Anticipated Outcomes Item Anticipated Outcome  Self Feeding independent  Swallowing  Supervision with regular textures and thin liquids    Basic self-care  supervision  Toileting  supervision   Bathroom Transfers supervision  Bowel/Bladder  Occasional incont of bowel and bladder.  LBM 03/18/2014  Transfers  Supervision  Locomotion  Supervision  Communication     Cognition  Min A   Pain  Pain level <4  Safety/Judgment  Max assist   Therapy Plan: PT Intensity: Minimum of 1-2 x/day ,45 to 90 minutes PT Frequency: 5 out of 7 days PT Duration Estimated Length of Stay: 12-14 days OT Intensity: Minimum of 1-2 x/day, 45 to 90 minutes OT Frequency: 5 out of 7 days OT Duration/Estimated Length of Stay: 12-14 days SLP Intensity: Minumum of 1-2 x/day, 30 to 90 minutes SLP Frequency: 5 out of 7 days SLP Duration/Estimated Length of Stay: 12-14 days        Team Interventions: Nursing Interventions Patient/Family Education, Bladder Management, Bowel Management, Pain Management,  Medication Management, Disease Management/Prevention, Skin Care/Wound Management  PT interventions Ambulation/gait training, Community reintegration, DME/adaptive equipment instruction, Neuromuscular re-education, Stair training, UE/LE Strength taining/ROM, UE/LE Coordination activities, Therapeutic Activities, Pain management, Discharge planning, Training and development officer, Cognitive remediation/compensation, Disease management/prevention, Functional mobility training, Patient/family education, Therapeutic Exercise, Visual/perceptual remediation/compensation  OT Interventions Training and development officer, Cognitive remediation/compensation, Community reintegration, Discharge planning, DME/adaptive equipment instruction, Functional mobility training, Patient/family education, Self Care/advanced ADL retraining, Therapeutic Activities, Therapeutic Exercise, UE/LE Strength taining/ROM  SLP Interventions Cognitive remediation/compensation, Cueing hierarchy, Dysphagia/aspiration precaution training, Environmental controls, Functional tasks, Internal/external aids, Patient/family education, Therapeutic Activities  TR Interventions    SW/CM Interventions Discharge Planning, Psychosocial Support, Patient/Family Education    Team Discharge Planning: Destination: PT-Home ,OT- Home , SLP-Home Projected Follow-up: PT-Home health PT, OT-  Home health OT, SLP-Home Health SLP, Outpatient SLP, 24 hour supervision/assistance Projected Equipment Needs: PT-To be determined, OT- To be determined, SLP-None recommended by SLP Equipment Details: PT- , OT-  Patient/family involved in discharge planning: PT- Patient,  OT-Patient, Family member/caregiver, SLP-Patient unable/family or caregive not available, Family member/caregiver  MD ELOS: 12-14 days Medical Rehab Prognosis:  Excellent Assessment: The patient has been admitted for CIR therapies with the diagnosis of SAH. The team will be addressing functional mobility,  strength, stamina, balance, safety, adaptive techniques and equipment, self-care, bowel and bladder mgt, patient and caregiver education, cognition,sleep-wake, behavior, NMR, visual-spatial awareness. Goals have been set at supervision for ADL's and mobility and min assist for cognition.    Meredith Staggers, MD, [facility]      See Team Conference Notes for  weekly updates to the plan of care

## 2014-03-20 NOTE — Progress Notes (Signed)
Patient ID: Sharon [name], female   DOB: May 13, 1934, 80 y.o.   MRN: [id number]    [facility] PHYSICAL MEDICINE & REHABILITATION     PROGRESS NOTE   03/20/14.  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 At rehab station. Sitting with eyes closed. Occasionally responds to me but appears somnolent  Past Medical History  Diagnosis Date  . Dyslipidemia   . Cystitis 03/02/2014    acute   . UTI (urinary tract infection) 03/02/2014  . Arthritis     osteo      Intake/Output Summary (Last 24 hours) at 03/20/14 1056 Last data filed at 03/19/14 2108  Gross per 24 hour  Intake    620 ml  Output      1 ml  Net    619 ml    Patient Vitals for the past 24 hrs:  BP Temp Temp src Pulse Resp SpO2  03/20/14 0619 123/70 mmHg 98.1 F (36.7 C) Axillary 83 16 93 %  03/19/14 1530 110/60 mmHg 98 F (36.7 C) Oral 89 16 98 %       Objective: Vital Signs: Blood pressure 123/70, pulse 83, temperature 98.1 F (36.7 C), temperature source Axillary, resp. rate 16, weight 52.753 kg (116 lb 4.8 oz), SpO2 93 %. No results found.  Recent Labs  03/18/14 0410 03/19/14 0605  WBC 9.6 10.4  HGB 11.6* 11.9*  HCT 34.6* 35.2*  PLT 228 229    Recent Labs  03/18/14 0410 03/19/14 0605  NA 140 138  K 3.8 3.8  CL 107 104  GLUCOSE 137* 97  BUN 9 7  CREATININE 0.51 0.52  CALCIUM 8.9 8.6   CBG (last 3)  No results for input(s): GLUCAP in the last 72 hours.  Wt Readings from Last 3 Encounters:  03/18/14 52.753 kg (116 lb 4.8 oz)  03/18/14 52.4 kg (115 lb 8.3 oz)  10/11/13 57.607 kg (127 lb)    Physical Exam:  Constitutional: She appears well-developed.  HENT:  Head: Normocephalic.  Eyes: EOM are normal.  Neck: Normal range of motion. Neck supple. No thyromegaly  present.  Cardiovascular: Normal rate and regular rhythm.  Respiratory: Effort normal and breath sounds normal. No respiratory distress.  GI: Soft. Bowel sounds are normal. She exhibits no distension.  Neurological: She is alert.  Patient is oriented to name. Keeps eyes closed. She does follow simple commands.  Skin: Skin is warm and dry   Medical Problem List and Plan: 1. 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 DVT Prophylaxis/Anticoagulation: SCDs. Monitor for any signs of DVT  3.  Hyperlipidemia. Zocor 4.  Neuropsych: This patient is capable of making decisions on her own behalf.  -seroquel hs to help with sundowning 5.  Hypertension- stable off meds;  Monitor with increased mobility  LOS (Days) 2 A FACE TO FACE EVALUATION WAS PERFORMED  Nyoka Cowden 03/20/2014 10:55 AM

## 2014-03-20 NOTE — Progress Notes (Signed)
Occupational Therapy Session Note  Patient Details  Name: Sharon [name] MRN: [id number] Date of Birth: [birth year]  Today's Date: 03/20/2014 OT Individual Time: 3151-7616 and 1400-1430 OT Individual Time Calculation (min): 60 min and 30 min   Short Term Goals: Week 1:  OT Short Term Goal 1 (Week 1): Pt. will maintain standing balance during bathing with SBA OT Short Term Goal 2 (Week 1): Pt will gather clothes with SBA for balance and minimal cues for organization. OT Short Term Goal 3 (Week 1): Pt will demonstrate sustained attention for 10 minutes in bathing session OT Short Term Goal 4 (Week 1): Pt. will transfer to toilet with SBA OT Short Term Goal 5 (Week 1): Pt. will demonstrate intellectual awareness of deficits with minimal verabal cues.   Skilled Therapeutic Interventions/Progress Updates:  Session 1: Upon entering the room, pt reclined in geri chair with no c/o pain this session. Pt appeared to be very lethargic this session and required max verbal cues and encouragement for participation. Bathing preformed at sink side with STS x  5 reps with min A to stand from geri chair. Pt dressing in bra which she required assistance to hook, donned B socks, and wore hospital gown secondary to no clean clothing available. Pt perseverating on combing section of hair and donning and doffing socks with mod- max verbal cues to continue. Pt did request items throughout session that she would like to use. When asked she was able to identify 3 items on sink with 100% accuracy. However, pt rubbed deodorant on hands and rubbed onto chest as well as attempting to rub soap on body like lotion. Pt remained in geri chair with QRB donned and son entering room upon therapist exit.  Session 2: Upon entering the room, pt seated in gerichair with boyfriend present in room. Pt shows no signs or symptoms of pain during session. Pt refusing toileting and ambulation this session. However, pt impulsively standing and  sitting back down with redirection as needed for safety. External distractions eliminated while in room with door closed and boyfriend gone. UNO cards 0-9 scattered around tray table with pt asked to identify in order each number 0-9 on her own. Pt having no difficulty finding 0, 1, and 2. Pt unable to located any card towards bottom left of tray table without max verbal cues for scanning. Pt required therapist to start counting from 0 to current number in order to locate next number in sequence as well. She was unable to tell the difference in 6 and 9 cards. Pt remained seated in geri chair with QRB and lap table donned upon exiting the room.   Therapy Documentation Precautions:  Precautions Precautions: Fall Precaution Comments: perseveration Restrictions Weight Bearing Restrictions: No Pain: Pain Assessment Pain Assessment: Faces Pain Score:  (unable to rate) Faces Pain Scale: Hurts little more Pain Type: Acute pain Pain Location: Head Pain Descriptors / Indicators: Aching Pain Onset: On-going Pain Intervention(s): Medication (See eMAR) Multiple Pain Sites: No  See FIM for current functional status  Therapy/Group: Individual Therapy  Phineas Semen 03/20/2014, 12:03 PM

## 2014-03-20 NOTE — Progress Notes (Signed)
Speech Language Pathology Daily Session Note  Patient Details  Name: Sharon [name] MRN: [id number] Date of Birth: [birth year]  Today's Date: 03/20/2014 SLP Individual Time: 1445-1515 SLP Individual Time Calculation (min): 30 min  Short Term Goals: Week 1: SLP Short Term Goal 1 (Week 1): Patient will demonstrate sustained attention to a functional task for 5 minutes with Mod A multimodal cues. SLP Short Term Goal 2 (Week 1): Patient will identify 2 physical and 2 cognitive deficits with Mod A multimodal cues.  SLP Short Term Goal 3 (Week 1): Patient will demonstrate functional problem solving for functional and familiar tasks with Mod A multimodal cues with 50% of opportunities.  SLP Short Term Goal 4 (Week 1): Patient will utilize the call bell to request assistance with Max A multimodal cues with 25% of opportunities SLP Short Term Goal 5 (Week 1): Patient will utilize small bites and a slow rate of self-feeding to minimize aspiration risk with current diet with Min A multimodal cues.  SLP Short Term Goal 6 (Week 1): Patient will utilize external memory aids to orient to date and situation with Mod A multimodal cues.   Skilled Therapeutic Interventions: Skilled ST intervention provided with focus on speech-language goals. Pt seen in room for individualized ST tx session. Slp modified environment by eliminating visual and auditory distractions in order to increase pt focus. Slp provided max verbal and visual cues for correct use of call bell. Pt attempted to stand from chair and use restroom without assistance, redirected appropriately. Subtest of RIPA administered to assess reading comprehension. Pt was 50% accurate independently with matching word-picture, word comprehension- visual distractions subtest. Pt required additional time and max multimodal cues for redirection to complete tasks.    FIM:  Comprehension Comprehension Mode: Auditory Comprehension: 4-Understands basic 75 - 89% of the  time/requires cueing 10 - 24% of the time Expression Expression Mode: Verbal Expression: 4-Expresses basic 75 - 89% of the time/requires cueing 10 - 24% of the time. Needs helper to occlude trach/needs to repeat words. Social Interaction Social Interaction: 2-Interacts appropriately 25 - 49% of time - Needs frequent redirection. Problem Solving Problem Solving: 2-Solves basic 25 - 49% of the time - needs direction more than half the time to initiate, plan or complete simple activities Memory Memory: 3-Recognizes or recalls 50 - 74% of the time/requires cueing 25 - 49% of the time  Pain Pain Assessment Pain Assessment: No/denies pain Pain Score: Asleep  Therapy/Group: Individual Therapy  Alen Matheson, Bernerd Pho 03/20/2014, 3:28 PM

## 2014-03-20 NOTE — Progress Notes (Signed)
Physical Therapy Session Note  Patient Details  Name: Sharon [name] MRN: [id number] Date of Birth: August 27, 1934  Today's Date: 03/20/2014 PT Individual Time: 0800-0900 PT Individual Time Calculation (min): 60 min   Short Term Goals: Week 1:  PT Short Term Goal 1 (Week 1): Pt to perform functional transfers with close(S), 50% of time PT Short Term Goal 2 (Week 1): Pt to ambulate 200' with close(S), 50% of time PT Short Term Goal 3 (Week 1): Pt to maintain dynamic standing balance during functional tasks with close(S), 50% of time PT Short Term Goal 4 (Week 1): Pt to maintain sustained attention to funcitonal tasks with min cues PT Short Term Goal 5 (Week 1): Pt to attend to L side of environment during funcitonal tasks with min cues  Skilled Therapeutic Interventions/Progress Updates:    Patient received asleep in gerichair. Session focused on increasing arousal/alertness, sustained attention, attention to the L, and command following with mobility and functional tasks. Patient requires max multimodal cues for 20 min to increase arousal level, including change in location from room>gym, cold wet wash cloth, verbal/tactile stimulation. Patient able to be encouraged/coaxed out of recliner in order to find RN for pain medicine due to c/o headache (RN reports patient has already had pain medication for this morning). Functional ambulation >150' x3 with R HHA and minA overall, modA for LOBs to the R, over tile and carpeted surfaces, facilitated task of retrieving water. Patient requires HOH to reach for a hold cup to fill with ice and water. Patient with motor perseverations of removing several cups from packaging and placing on counter top.  Patient set up on NuStep with therapist initially facilitating movements to increase arousal. Patient able to sustain attention to NuStep task at Level 2 with B UEs/LEs x15-30" at a time before requiring cues to continue activity. Patient continues with  perseverations of donning/doffing headband and glasses throughout activity.  Stair negotiation of 5 steps with B handrails and minA, verbal cues for safety with stepping (whole foot on step, sequencing, etc.). Patient assisted with pushing gerichair back to room, >150' with minA overall; noted narrow BOS and intermittent scissoring. Patient lethargic throughout session, but able to participate with max multimodal cues. Patient returned to room and left sitting in gerichair with seatbelt donned and tray table in place; all needs within reach.  Therapy Documentation Precautions:  Precautions Precautions: Fall Precaution Comments: perseveration Restrictions Weight Bearing Restrictions: No Pain: Pain Assessment Pain Assessment: 0-10 (unable to rate) Pain Score:  (unable to rate) Faces Pain Scale: Hurts even more Pain Type: Acute pain Pain Location: Head Pain Descriptors / Indicators: Headache Pain Onset: On-going Pain Intervention(s): RN made aware;Repositioned;Ambulation/increased activity Multiple Pain Sites: No Locomotion : Ambulation Ambulation/Gait Assistance: 4: Min assist   See FIM for current functional status  Therapy/Group: Individual Therapy  Lillia Abed. Alyaan Budzynski, PT, DPT  03/20/2014, 8:47 AM

## 2014-03-21 ENCOUNTER — Inpatient Hospital Stay (HOSPITAL_COMMUNITY): Payer: Medicare Other

## 2014-03-21 ENCOUNTER — Encounter (HOSPITAL_COMMUNITY): Payer: PRIVATE HEALTH INSURANCE

## 2014-03-21 ENCOUNTER — Inpatient Hospital Stay (HOSPITAL_COMMUNITY): Payer: PRIVATE HEALTH INSURANCE

## 2014-03-21 NOTE — Progress Notes (Signed)
Patient ID: Sharon [name], female   DOB: 10-27-1934, 80 y.o.   MRN: [id number]   Patient ID: Sharon [name], female   DOB: February 12, 1935, 80 y.o.   MRN: [id number]    [facility] PHYSICAL MEDICINE & REHABILITATION     PROGRESS NOTE   03/21/14.  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  Lab update unremarkable and repeat Head CT stable.  Patient apparently had dramatic improvement after shunt placement that was short lived;  She remains withdrawn, sits with eyes closed and face covered.  She continues to eat well but does not react well with family and largely noncommunicative; seroquil stopped yesterday  Past Medical History  Diagnosis Date  . Dyslipidemia   . Cystitis 03/02/2014    acute   . UTI (urinary tract infection) 03/02/2014  . Arthritis     osteo      Intake/Output Summary (Last 24 hours) at 03/21/14 0959 Last data filed at 03/21/14 0700  Gross per 24 hour  Intake    420 ml  Output      0 ml  Net    420 ml    Patient Vitals for the past 24 hrs:  BP Temp Temp src Pulse Resp SpO2  03/21/14 0400 115/68 mmHg 97.8 F (36.6 C) Axillary 82 16 -  03/20/14 1500 (!) 104/51 mmHg 97.8 F (36.6 C) Oral 81 16 97 %       Objective: Vital Signs: Blood pressure 115/68, pulse 82, temperature 97.8 F (36.6 C), temperature source Axillary, resp. rate 16, weight 52.753 kg (116 lb 4.8 oz), SpO2 97 %. Ct Head Wo Contrast  03/20/2014   CLINICAL DATA:  Lethargy  EXAM: CT HEAD WITHOUT CONTRAST  TECHNIQUE: Contiguous axial images were obtained from the base of the skull through the vertex without intravenous contrast.  COMPARISON:  03/16/2014  FINDINGS: Prior coiling of RIGHT posterior communicating artery  region aneurysm with associated beam hardening artifacts.  Intraventricular catheter via RIGHT parietal approach again identified tip traversing the third ventricle into the LEFT splenic region.  Normal ventricular morphology.  No midline shift or mass effect.  Small vessel chronic ischemic changes of deep cerebral white matter.  No intracranial hemorrhage, mass lesion, or evidence acute infarction.  No extra-axial fluid collections.  Bones and sinuses unremarkable.  IMPRESSION: Post procedural changes from aneurysm coiling and ventricular shunting again seen, stable.  Small vessel chronic ischemic changes of deep cerebral white matter.  No acute intracranial abnormalities.   Electronically Signed   By: Lavonia Dana M.D.   On: 03/20/2014 18:06    Recent Labs  03/19/14 0605 03/20/14 1800  WBC 10.4 9.7  HGB 11.9* 12.5  HCT 35.2* 37.6  PLT 229 246    Recent Labs  03/19/14 0605 03/20/14 1800  NA 138 135  K 3.8 4.2  CL 104 97  GLUCOSE 97 110*  BUN 7 13  CREATININE 0.52 0.62  CALCIUM 8.6 8.9   CBG (last 3)  No results for input(s): GLUCAP in the last 72 hours.  Wt Readings from Last 3 Encounters:  03/18/14 52.753 kg (116 lb 4.8 oz)  03/18/14 52.4 kg (115 lb 8.3 oz)  10/11/13 57.607 kg (127 lb)    Physical Exam:  Constitutional: She appears well-developed.  HENT:  Head: Normocephalic.  Eyes: EOM are normal.  Neck: Normal range of motion. Neck supple. No thyromegaly present.  Cardiovascular: Normal rate and regular rhythm.  Respiratory: Effort normal and breath sounds normal. No respiratory distress.  GI: Soft. Bowel sounds are normal. She exhibits no distension.  Neurological: She is alert.  Keeps eyes closed and attempts to keep face covered; She does follow simple commands but no verbal response.  No motor weakness Skin: Skin is warm and dry   Medical Problem List and Plan: 1. Functional deficits secondary to right-sided SAH/right posterior communicating artery  aneurysm status post coiling 03/05/2014 with communicating hydrocephalus status post VP shunt 03/15/2014 2. DVT Prophylaxis/Anticoagulation: SCDs. Monitor for any signs of DVT  3.  Hyperlipidemia. Zocor  4.  Hypertension- stable off meds;  Monitor with increased mobility  LOS (Days) 3 A FACE TO FACE EVALUATION WAS PERFORMED  Nyoka Cowden 03/21/2014 9:59 AM

## 2014-03-21 NOTE — Care Management Note (Signed)
Inpatient [facility] Individual Statement of Services  Patient Name:  Sharon [name]  Date:  03/21/2014  Welcome to the [facility].  Our goal is to provide you with an individualized program based on your diagnosis and situation, designed to meet your specific needs.  With this comprehensive rehabilitation program, you will be expected to participate in at least 3 hours of rehabilitation therapies Monday-Friday, with modified therapy programming on the weekends.  Your rehabilitation program will include the following services:  Physical Therapy (PT), Occupational Therapy (OT), Speech Therapy (ST), 24 hour per day rehabilitation nursing, Therapeutic Recreaction (TR), Neuropsychology, Case Management (Social Worker), Rehabilitation Medicine, Nutrition Services and Pharmacy Services  Weekly team conferences will be held on Tuesdays to discuss your progress.  Your Social Worker will talk with you frequently to get your input and to update you on team discussions.  Team conferences with you and your family in attendance may also be held.  Expected length of stay: 10 days  Overall anticipated outcome: supervision  Depending on your progress and recovery, your program may change. Your Social Worker will coordinate services and will keep you informed of any changes. Your Social Worker's name and contact numbers are listed  below.  The following services may also be recommended but are not provided by the [facility] will be made to provide these services after discharge if needed.  Arrangements include referral to agencies that provide these services.  Your insurance has been verified to be:  Medicare Your primary doctor is:  Dr. Kelton Pillar  Pertinent information will be shared with your doctor and  your insurance company.  Social Worker:  [place], [place] or ([phone]   Information discussed with and copy given to patient by: Lennart Pall, 03/21/2014, 4:57 PM

## 2014-03-21 NOTE — Progress Notes (Signed)
Physical Therapy Session Note  Patient Details  Name: Sharon [name] MRN: [id number] Date of Birth: [birth year]  Today's Date: 03/21/2014 PT Individual Time: 1300-1400 PT Individual Time Calculation (min): 60 min   Session 2 Time: 1500-1530 Time Calculation (min): 30   Short Term Goals: Week 1:  PT Short Term Goal 1 (Week 1): Pt to perform functional transfers with close(S), 50% of time PT Short Term Goal 2 (Week 1): Pt to ambulate 200' with close(S), 50% of time PT Short Term Goal 3 (Week 1): Pt to maintain dynamic standing balance during functional tasks with close(S), 50% of time PT Short Term Goal 4 (Week 1): Pt to maintain sustained attention to funcitonal tasks with min cues PT Short Term Goal 5 (Week 1): Pt to attend to L side of environment during funcitonal tasks with min cues  Skilled Therapeutic Interventions/Progress Updates:    Session 1: Pt received seated in geri chair, agreeable to participate in therapy. Session focused on cognitive remediation, ambulation, attention to L. Pt oriented to self and situation ("I had an aneurysm") but not to place or time. Throughout session pt demonstrated moderate word finding difficulty and verbal and object perseveration. Pt ambulated 100' x2 w/ R HHA, gait characterized by frequent scissoring bilaterally and intermittent losses of balance to L requiring ModA to correct. Pt demonstrated increased gait impairments in environment with increased auditory and visual distractions. Pt completed 2 easy pipe tree puzzles (field goal and cross) w/ max-total instructional cueing and extra time. Pt benefited from having all materials on R side of visual field, however pt perseverative on certain incorrect pipe pieces despite acknowledging that she does not see them in the picture. Pt also benefited from having fewer choices to pick from instead of having to pick pieces out of large box. Session ended in pt's room, where pt was left seated in geri chair w/  lap tray on and family present w/ all needs within reach.    Session 2: Pt received seated in geri chair, agreeable to participate in therapy. Pt demonstrating similar cognition to earlier session, see prior write up. Additionally pt unable to recall activities of earlier session. Pt ambulated 69' x2 w/ HHA with frequent scissoring bilaterally and losses of balance to L w/ ModA to correct. In room pt found to have been incontinent of bowel. Pt did not initiate need for bowel care, but responded affirmatively when asked if she had to use the bathroom. Pt ambulated to bathroom w/ HHA and ModA for maintaining balance. Pt required frequent cueing to remain on commode. Pt demonstrated moderate impulsivity and object perseveration. Pt left seated in w/c w/ NT present to assist with clean up and changing.    Therapy Documentation Precautions:  Precautions Precautions: Fall Precaution Comments: perseveration Restrictions Weight Bearing Restrictions: No Pain:  No/denies pain  See FIM for current functional status  Therapy/Group: Individual Therapy  Rada Hay  Rada Hay, PT, DPT 03/21/2014, 7:53 AM

## 2014-03-22 ENCOUNTER — Inpatient Hospital Stay (HOSPITAL_COMMUNITY): Payer: Medicare Other

## 2014-03-22 ENCOUNTER — Inpatient Hospital Stay (HOSPITAL_COMMUNITY): Payer: PRIVATE HEALTH INSURANCE

## 2014-03-22 ENCOUNTER — Encounter (HOSPITAL_COMMUNITY): Payer: PRIVATE HEALTH INSURANCE

## 2014-03-22 NOTE — Progress Notes (Signed)
Speech Language Pathology Daily Session Note  Patient Details  Name: Sharon [name] MRN: [id number] Date of Birth: 04/16/1934  Today's Date: 03/22/2014 SLP Individual Time: 0850-0920 SLP Individual Time Calculation (min): 30 min  Short Term Goals: Week 1: SLP Short Term Goal 1 (Week 1): Patient will demonstrate sustained attention to a functional task for 5 minutes with Mod A multimodal cues. SLP Short Term Goal 2 (Week 1): Patient will identify 2 physical and 2 cognitive deficits with Mod A multimodal cues.  SLP Short Term Goal 3 (Week 1): Patient will demonstrate functional problem solving for functional and familiar tasks with Mod A multimodal cues with 50% of opportunities.  SLP Short Term Goal 4 (Week 1): Patient will utilize the call bell to request assistance with Max A multimodal cues with 25% of opportunities SLP Short Term Goal 5 (Week 1): Patient will utilize small bites and a slow rate of self-feeding to minimize aspiration risk with current diet with Min A multimodal cues.  SLP Short Term Goal 6 (Week 1): Patient will utilize external memory aids to orient to date and situation with Mod A multimodal cues.   Skilled Therapeutic Interventions: Skilled treatment focused on swallowing and cognitive goals. SLP facilitated session with skilled observation during breakfast meal, with Mod-Max cues provided for sustained attention to self-feeding task. Pt also required Mod cues for basic problem solving with basic, familiar task. She expressively identified items on her tray with Min cues. During schedule reading activity, SLP provided Max cueing for problem solving and use of external aid to facilitate working memory in order to answer simple questions. Son was present and engaged throughout session.   FIM:  Comprehension Comprehension Mode: Auditory Comprehension: 3-Understands basic 50 - 74% of the time/requires cueing 25 - 50%  of the time Expression Expression Mode:  Verbal Expression: 3-Expresses basic 50 - 74% of the time/requires cueing 25 - 50% of the time. Needs to repeat parts of sentences. Social Interaction Social Interaction: 2-Interacts appropriately 25 - 49% of time - Needs frequent redirection. Problem Solving Problem Solving: 2-Solves basic 25 - 49% of the time - needs direction more than half the time to initiate, plan or complete simple activities Memory Memory: 2-Recognizes or recalls 25 - 49% of the time/requires cueing 51 - 75% of the time FIM - Eating Eating Activity: 5: Needs verbal cues/supervision  Pain Pain Assessment Pain Assessment: No/denies pain  Therapy/Group: Individual Therapy   Germain Osgood, M.A. CCC-SLP [phone]  Germain Osgood 03/22/2014, 9:45 AM

## 2014-03-22 NOTE — Progress Notes (Signed)
Physical Therapy Session Note  Patient Details  Name: Sharon [name] MRN: [id number] Date of Birth: 1934/08/13  Today's Date: 03/22/2014 PT Individual Time: 1000-1100 PT Individual Time Calculation (min): 60 min   Short Term Goals: Week 1:  PT Short Term Goal 1 (Week 1): Pt to perform functional transfers with close(S), 50% of time PT Short Term Goal 2 (Week 1): Pt to ambulate 200' with close(S), 50% of time PT Short Term Goal 3 (Week 1): Pt to maintain dynamic standing balance during functional tasks with close(S), 50% of time PT Short Term Goal 4 (Week 1): Pt to maintain sustained attention to funcitonal tasks with min cues PT Short Term Goal 5 (Week 1): Pt to attend to L side of environment during funcitonal tasks with min cues  Skilled Therapeutic Interventions/Progress Updates:  1:1. Pt received semi-reclined in bed sleeping, pt req max multimodal cues to wake. Focus this session on activity tolerance, focused>sustained attention, following 1 step commands and safety during functional mobility/transfers. Pt req min A for t/f sup>sit EOB with use of hospital bed functions, however, immediately upon sitting/standing up pt incontinent of bladder. Pt assisted to bathroom for clean up and able to complete toileting once on toilet. Pt req max-total A step by step cues with frequent redirection to task for clean up including washing hands/bottom at sink due to overall decreased focused>sustained attention and perseveration. Pt req max-total cues for orientation throughout session. Pt req overall mod HHA for ambulation throughout session multiple bouts >150' due to L lean, narrow>scissoring BOS and uneven reciprocal step lengths.   Pt engaged in basic seated cognitive task in quiet gym to target focused>sustained attention. Pt accurately naming numbers/letters/colors in field of two, however, only 25% accurate when presented with field of 4.   Pt with increased episodes of closing eyes and rubbing  head by end of session. Req prompting to indicate feeling dizzy and having minor headache. BP assessed sitting: 116/68; Standing at 41min: 82/62 and Standing at 18min: 98/72.   Pt left sitting in geri chair at nurses station at end of session with posey belt in place. RN and PC-C made aware of concerns this session.   Therapy Documentation Precautions:  Precautions Precautions: Fall Precaution Comments: perseveration Restrictions Weight Bearing Restrictions: No  See FIM for current functional status  Therapy/Group: Individual Therapy  Emeline, Simpson 03/22/2014, 12:15 PM

## 2014-03-22 NOTE — Progress Notes (Signed)
Occupational Therapy Session Note  Patient Details  Name: Sharon [name] MRN: [id number] Date of Birth: 09-Sep-1934  Today's Date: 03/22/2014 OT Individual Time: 1100-1200 OT Individual Time Calculation (min): 60 min    Short Term Goals: Week 1:  OT Short Term Goal 1 (Week 1): Pt. will maintain standing balance during bathing with SBA OT Short Term Goal 2 (Week 1): Pt will gather clothes with SBA for balance and minimal cues for organization. OT Short Term Goal 3 (Week 1): Pt will demonstrate sustained attention for 10 minutes in bathing session OT Short Term Goal 4 (Week 1): Pt. will transfer to toilet with SBA OT Short Term Goal 5 (Week 1): Pt. will demonstrate intellectual awareness of deficits with minimal verabal cues.   Skilled Therapeutic Interventions/Progress Updates:    Pt resting in gerichair at nurses station and agreeable to engaging in BADL retraining including bathing at shower level and dressing with sit<>stand from EOB.  Pt oriented to person only.  Pt unable to state her address or where her shop was located.  Pt perseverated on wringing out wash cloth in shower.  Pt attempted to apply deodorant to fingernails.  Pt noted with slight lean to left in unsupported sitting and standing.  Pt completed dressing tasks when presented with clothing items but required max verbal cues for bathing tasks.  Pt demonstrated sustained attention for approx 5 mins during session, requiring min verbal cues to redirect.  Focus on activity tolerance, sitting balance, standing balance, attention to task, task initiation, orientation, functional transfers, and safety awareness.  Therapy Documentation Precautions:  Precautions Precautions: Fall Precaution Comments: perseveration Restrictions Weight Bearing Restrictions: No   Pain: Pain Assessment Pain Assessment: No/denies pain  See FIM for current functional status  Therapy/Group: Individual Therapy  Leroy Libman 03/22/2014,  12:07 PM

## 2014-03-22 NOTE — Progress Notes (Signed)
INITIAL NUTRITION ASSESSMENT  DOCUMENTATION CODES Per approved criteria  -Not Applicable   INTERVENTION: Continue Ensure Complete po BID, each supplement provides 350 kcal and 13 grams of protein.  Provide Magic cup BID with meals, each supplement provides 290 kcal and 9 grams of protein.  Encourage adequate PO intake.  NUTRITION DIAGNOSIS: Inadequate oral intake related to confusion, needing assistance at meals as evidenced by varied meal completion of 40-100%.   Goal: Pt to meet >/= 90% of their estimated nutrition needs   Monitor:  PO intake, weight trends, labs, I/O's  Reason for Assessment: MST  80 y.o. female  Admitting Dx: Hydrocephalus  ASSESSMENT: Pt with history of hyperlipidemia. Admitted 03/02/2014 with altered mental status. Lumbar puncture completed showing significant subarachnoid blood mixed with xanthochromic fluid. Interventional radiology consulted with cerebral arteriogram completed and underwent coiling of aneurysm 03/05/2014.   Pt reports having a great appetite. Meal completion has varied from 40-100%. Pt has been drinking her Ensure. Will continue with current ordered. RD to additionally add Magic cup to aid in caloric and protein needs as pt reports liking ice cream. Pt with weight loss, however is not found significant. Pt was encouraged to eat her food at meals and to take her supplements.   Nutrition Focused Physical Exam:  Subcutaneous Fat:  Orbital Region: N/A Upper Arm Region: WNL Thoracic and Lumbar Region: WNL  Muscle:  Temple Region: N/A Clavicle Bone Region: Severe depletion Clavicle and Acromion Bone Region: Severe depletion Scapular Bone Region: N/A Dorsal Hand: Severe depletion Patellar Region: Moderate depletion Anterior Thigh Region: Moderate depletion Posterior Calf Region: Moderate depletion  Edema: none  Labs and medications reviewed.  Height: Ht Readings from Last 1 Encounters:  03/18/14 5\' 3"  (1.6 m)     Weight: Wt Readings from Last 1 Encounters:  03/18/14 116 lb 4.8 oz (52.753 kg)    Ideal Body Weight: 115 lbs  % Ideal Body Weight: 101%  Wt Readings from Last 10 Encounters:  03/18/14 116 lb 4.8 oz (52.753 kg)  03/18/14 115 lb 8.3 oz (52.4 kg)  10/11/13 127 lb (57.607 kg)    Usual Body Weight: 120 lbs  % Usual Body Weight: 97%  BMI:  Body mass index is 20.61 kg/(m^2).  Estimated Nutritional Needs: Kcal: 1700-1900 Protein: 65-75 grams Fluid: 1.7 - 1.9 L/day  Skin: incision on right head and abdomen  Diet Order: DIET DYS 3  EDUCATION NEEDS: -No education needs identified at this time   Intake/Output Summary (Last 24 hours) at 03/22/14 1543 Last data filed at 03/22/14 1429  Gross per 24 hour  Intake    480 ml  Output      0 ml  Net    480 ml    Last BM: 1/18  Labs:   Recent Labs Lab 03/16/14 0800 03/17/14 0520 03/18/14 0410 03/19/14 0605 03/20/14 1800  NA 131* 131* 140 138 135  K 4.1 4.1 3.8 3.8 4.2  CL 104 102 107 104 97  CO2 25 25 28 30 31   BUN 11 8 9 7 13   CREATININE 0.64 0.58 0.51 0.52 0.62  CALCIUM 7.1* 7.5* 8.9 8.6 8.9  MG 2.0 2.1 1.9  --   --   PHOS 3.0 2.2* 2.9  --   --   GLUCOSE 110* 96 137* 97 110*    CBG (last 3)  No results for input(s): GLUCAP in the last 72 hours.  Scheduled Meds: . dexamethasone  1 mg Oral Q12H  . famotidine  20 mg Oral Daily  .  feeding supplement (ENSURE COMPLETE)  237 mL Oral BID BM  . niMODipine  60 mg Oral 6 times per day  . simvastatin  20 mg Oral q1800  . sodium chloride  10-40 mL Intracatheter Q12H    Continuous Infusions:   Past Medical History  Diagnosis Date  . Dyslipidemia   . Cystitis 03/02/2014    acute   . UTI (urinary tract infection) 03/02/2014  . Arthritis     osteo    Past Surgical History  Procedure Laterality Date  . Abdominal hysterectomy    . Radiology with anesthesia N/A 03/04/2014    Procedure: RADIOLOGY WITH ANESTHESIA;  Surgeon: Medication Radiologist, MD;   Location: [place];  Service: Radiology;  Laterality: N/A;  . Radiology with anesthesia N/A 03/04/2014    Procedure: RADIOLOGY WITH ANESTHESIA;  Surgeon: Rob Hickman, MD;  Location: [facility];  Service: Radiology;  Laterality: N/A;  . Ventriculoperitoneal shunt Right 03/15/2014    Procedure: SHUNT INSERTION VENTRICULAR-PERITONEAL;  Surgeon: Charlie Pitter, MD;  Location: [place] [facility];  Service: Neurosurgery;  Laterality: Right;    Kallie Locks, MS, RD, LDN Pager # [phone] After hours/ weekend pager # [phone]

## 2014-03-22 NOTE — Progress Notes (Addendum)
[facility] PHYSICAL MEDICINE & REHABILITATION     PROGRESS NOTE    Subjective/Complaints: Had a difficult weekend. Apparently seroquel caused sedation the following day. Was up with therapy yesterday. Slept about 4 hours last night. Difficult to arouse this am.  Objective: Vital Signs: Blood pressure 112/82, pulse 82, temperature 98 F (36.7 C), temperature source Oral, resp. rate 19, weight 52.753 kg (116 lb 4.8 oz), SpO2 97 %. Ct Head Wo Contrast  03/20/2014   CLINICAL DATA:  Lethargy  EXAM: CT HEAD WITHOUT CONTRAST  TECHNIQUE: Contiguous axial images were obtained from the base of the skull through the vertex without intravenous contrast.  COMPARISON:  03/16/2014  FINDINGS: Prior coiling of RIGHT posterior communicating artery region aneurysm with associated beam hardening artifacts.  Intraventricular catheter via RIGHT parietal approach again identified tip traversing the third ventricle into the LEFT splenic region.  Normal ventricular morphology.  No midline shift or mass effect.  Small vessel chronic ischemic changes of deep cerebral white matter.  No intracranial hemorrhage, mass lesion, or evidence acute infarction.  No extra-axial fluid collections.  Bones and sinuses unremarkable.  IMPRESSION: Post procedural changes from aneurysm coiling and ventricular shunting again seen, stable.  Small vessel chronic ischemic changes of deep cerebral white matter.  No acute intracranial abnormalities.   Electronically Signed   By: Lavonia Dana M.D.   On: 03/20/2014 18:06    Recent Labs  03/20/14 1800  WBC 9.7  HGB 12.5  HCT 37.6  PLT 246    Recent Labs  03/20/14 1800  NA 135  K 4.2  CL 97  GLUCOSE 110*  BUN 13  CREATININE 0.62  CALCIUM 8.9   CBG (last 3)  No results for input(s): GLUCAP in the last 72 hours.  Wt Readings from Last 3 Encounters:  03/18/14 52.753 kg (116 lb 4.8 oz)  03/18/14 52.4 kg (115 lb 8.3 oz)  10/11/13 57.607 kg (127 lb)    Physical Exam:   Constitutional: She appears well-developed.  HENT:  Head: Normocephalic.  Eyes: EOM are normal.  Neck: Normal range of motion. Neck supple. No thyromegaly present.  Cardiovascular: Normal rate and regular rhythm.  Respiratory: Effort normal and breath sounds normal. No respiratory distress.  GI: Soft. Bowel sounds are normal. She exhibits no distension.  Neurological: She is alert.  Patient is lying in bed. Does not arouse to verbal stim or light tactile stim. I did not push her further due to reports that she didn't sleep. S   Prior motor exam (not done today): RUE: 5/5 Deltoid, 5/5 Biceps, 5/5 Triceps, 5/5 Wrist Ext, 5/5 Grip LUE: 5/5 Deltoid, 5/5 Biceps, 5/5 Triceps, 5/5 Wrist Ext, 5/5 Grip RLE: HF 5/5, KE 5/5, KF 5/5, ADF 5/5, APF 5/5 LLE: HF 5/5, KE 5/5, HF, 5/5, ADF 5/5, APF 5/5   Skin: Skin is warm and dry  Assessment/Plan: 1. Functional deficits secondary to SAH/right posterior communicating artery aneurysm which require 3+ hours per day of interdisciplinary therapy in a comprehensive inpatient rehab setting. Physiatrist is providing close team supervision and 24 hour management of active medical problems listed below. Physiatrist and rehab team continue to assess barriers to discharge/monitor patient progress toward functional and medical goals. FIM: FIM - Bathing Bathing Steps Patient Completed: Chest, Right Arm, Left Arm, Abdomen, Front perineal area, Buttocks, Right upper leg, Left upper leg Bathing: 4: Min-Patient completes 8-9 57f 10 parts or 75+ percent  FIM - Upper Body Dressing/Undressing Upper body dressing/undressing steps patient completed: Thread/unthread left bra strap,  Thread/unthread right bra strap (bra under gown) Upper body dressing/undressing: 4: Min-Patient completed 75 plus % of tasks FIM - Lower Body Dressing/Undressing Lower body dressing/undressing steps patient completed: Don/Doff left sock, Don/Doff right sock (Pt wearing hospital gown.  ) Lower body dressing/undressing: 5: Set-up assist to: Obtain clothing  FIM - Toileting Toileting steps completed by patient: Adjust clothing prior to toileting, Performs perineal hygiene, Adjust clothing after toileting Toileting Assistive Devices: Grab bar or rail for support Toileting: 4: Steadying assist  FIM - Radio producer Devices: Grab bars Toilet Transfers: 4-To toilet/BSC: Min A (steadying Pt. > 75%), 4-From toilet/BSC: Min A (steadying Pt. > 75%)  FIM - Bed/Chair Transfer Bed/Chair Transfer Assistive Devices: Arm rests Bed/Chair Transfer: 4: Bed > Chair or W/C: Min A (steadying Pt. > 75%), 4: Chair or W/C > Bed: Min A (steadying Pt. > 75%)  FIM - Locomotion: Wheelchair Locomotion: Wheelchair: 1: Total Assistance/staff pushes wheelchair (Pt<25%) (in Sport and exercise psychologist) FIM - Locomotion: Ambulation Locomotion: Ambulation Assistive Devices: Other (comment) (R HHA) Ambulation/Gait Assistance: 4: Min assist Locomotion: Ambulation: 4: Travels 150 ft or more with minimal assistance (Pt.>75%)  Comprehension Comprehension Mode: Auditory Comprehension: 3-Understands basic 50 - 74% of the time/requires cueing 25 - 50%  of the time  Expression Expression Mode: Verbal Expression: 3-Expresses basic 50 - 74% of the time/requires cueing 25 - 50% of the time. Needs to repeat parts of sentences.  Social Interaction Social Interaction: 2-Interacts appropriately 25 - 49% of time - Needs frequent redirection.  Problem Solving Problem Solving: 1-Solves basic less than 25% of the time - needs direction nearly all the time or does not effectively solve problems and may need a restraint for safety  Memory Memory: 2-Recognizes or recalls 25 - 49% of the time/requires cueing 51 - 75% of the time  Medical Problem List and Plan: 1. Functional deficits secondary to right-sided SAH/right posterior communicating artery aneurysm status post coiling 03/05/2014 with communicating  hydrocephalus status post VP shunt 03/15/2014  -AMS---likely multifactorial due to hydrocephalus/SAH, poor sleep/wake, meds  -check urine specimen today also  -meds should not be a problem now 2. DVT Prophylaxis/Anticoagulation: SCDs.   3. Pain Management: Tylenol as needed 4. Hyperlipidemia. Zocor 5. Neuropsych: This patient is not capable of making decisions on her own behalf.  -seroquel stopped due to sedation  -continue wb for now 6. Skin/Wound Care: Routine skin checks 7. Fluids/Electrolytes/Nutrition: chemistries ok  -ate well yesterday 8. Hypertension Nimotop protocol. Monitor with increased mobility LOS (Days) 4 A FACE TO FACE EVALUATION WAS PERFORMED  SWARTZ,ZACHARY T 03/22/2014 8:13 AM

## 2014-03-22 NOTE — Progress Notes (Signed)
Physical Therapy Session Note  Patient Details  Name: Sharon [name] MRN: [id number] Date of Birth: [birth year]  Today's Date: 03/22/2014 PT Individual Time: 1530-1600 PT Individual Time Calculation (min): 30 min   Short Term Goals: Week 1:  PT Short Term Goal 1 (Week 1): Pt to perform functional transfers with close(S), 50% of time PT Short Term Goal 2 (Week 1): Pt to ambulate 200' with close(S), 50% of time PT Short Term Goal 3 (Week 1): Pt to maintain dynamic standing balance during functional tasks with close(S), 50% of time PT Short Term Goal 4 (Week 1): Pt to maintain sustained attention to funcitonal tasks with min cues PT Short Term Goal 5 (Week 1): Pt to attend to L side of environment during funcitonal tasks with min cues  Skilled Therapeutic Interventions/Progress Updates:    Pt received seated at nurse's station asleep, required max multimodal cueing to rouse and participate in therapy. Session focused on cognitive remediation, orientation. Pt oriented to self only, being able to correctly identify her first name ~50% of the time. Pt not able to identify hospital, city, state, or reason for admission even immediately after therapist provided correct answer. Pt participated in number identification ("what number is this?") and color identification ("point to the orange block") activity with <10% accuracy. Note that pt was able to correctly identify next number in sequence when therapist began counting, but pt could not continue counting after she identified immediate sequential number. Also note pt demonstrated one higher level cognitive activity of pretending to throw up on therapist while crouched down and then laughing at the reaction. Session ended in pt's room, where pt was left seated in geri chair with soft waist belt on, lap tray in place and boyfriend and son present w/ all needs within reach.    Therapy Documentation Precautions:  Precautions Precautions: Fall Precaution  Comments: perseveration Restrictions Weight Bearing Restrictions: No Pain: Pain Assessment Pain Assessment: Faces Faces Pain Scale: No hurt  See FIM for current functional status  Therapy/Group: Individual Therapy  Rada Hay  Rada Hay, PT, DPT 03/22/2014, 7:21 AM

## 2014-03-23 ENCOUNTER — Inpatient Hospital Stay (HOSPITAL_COMMUNITY): Payer: Medicare Other | Admitting: Speech Pathology

## 2014-03-23 ENCOUNTER — Encounter (HOSPITAL_COMMUNITY): Payer: PRIVATE HEALTH INSURANCE

## 2014-03-23 ENCOUNTER — Inpatient Hospital Stay (HOSPITAL_COMMUNITY): Payer: Medicare Other | Admitting: Physical Therapy

## 2014-03-23 DIAGNOSIS — N390 Urinary tract infection, site not specified: Secondary | ICD-10-CM

## 2014-03-23 DIAGNOSIS — A499 Bacterial infection, unspecified: Secondary | ICD-10-CM

## 2014-03-23 LAB — URINALYSIS, ROUTINE W REFLEX MICROSCOPIC
Bilirubin Urine: NEGATIVE
Glucose, UA: NEGATIVE mg/dL
KETONES UR: NEGATIVE mg/dL
Nitrite: POSITIVE — AB
Protein, ur: NEGATIVE mg/dL
Specific Gravity, Urine: 1.014 (ref 1.005–1.030)
Urobilinogen, UA: 0.2 mg/dL (ref 0.0–1.0)
pH: 6.5 (ref 5.0–8.0)

## 2014-03-23 LAB — URINE MICROSCOPIC-ADD ON

## 2014-03-23 MED ORDER — CEPHALEXIN 250 MG PO CAPS
250.0000 mg | ORAL_CAPSULE | Freq: Three times a day (TID) | ORAL | Status: DC
  Administered 2014-03-23 – 2014-03-24 (×5): 250 mg via ORAL
  Filled 2014-03-23 (×7): qty 1

## 2014-03-23 MED ORDER — TRAZODONE HCL 50 MG PO TABS: 25.0000 mg | ORAL_TABLET | Freq: Every evening | ORAL | Status: DC | PRN

## 2014-03-23 MED ORDER — TRAZODONE HCL 50 MG PO TABS
25.0000 mg | ORAL_TABLET | Freq: Every day | ORAL | Status: DC
  Administered 2014-03-23 – 2014-03-26 (×4): 25 mg via ORAL
  Filled 2014-03-23 (×4): qty 1

## 2014-03-23 NOTE — Progress Notes (Signed)
Physical Therapy Session Note  Patient Details  Name: Sharon [name] MRN: [id number] Date of Birth: 1934/05/18  Today's Date: 03/23/2014 PT Individual Time: 9628-3662 PT Individual Time Calculation (min): 60 min   Short Term Goals: Week 1:  PT Short Term Goal 1 (Week 1): Pt to perform functional transfers with close(S), 50% of time PT Short Term Goal 2 (Week 1): Pt to ambulate 200' with close(S), 50% of time PT Short Term Goal 3 (Week 1): Pt to maintain dynamic standing balance during functional tasks with close(S), 50% of time PT Short Term Goal 4 (Week 1): Pt to maintain sustained attention to funcitonal tasks with min cues PT Short Term Goal 5 (Week 1): Pt to attend to L side of environment during funcitonal tasks with min cues  Skilled Therapeutic Interventions/Progress Updates:   Session focused on orientation, attention to task in context of therapeutic activity, and cognitive remediation.  Pt received asleep in gerichair at [employer] station, easily awakened and agreeable to using bathroom with cuing stating, "You must be a mind reader." Patient required total multimodal cues for orientation. After being reoriented, patient continues to require max multimodal cues throughout session for orientation to place and situation. In patient's room, engaged in patient naming people in pictures brought in by family. P able to state names of family members 50% of time but unable to recall relations. Pt ambulated throughout rehab unit in controlled environment multiple bouts up to 150 ft with min L HHA or no AD and min A with narrow > scissoring BOS, decreased L stance time, and lean to L. Pt performed toilet transfer with steady assist and supervision for clothing management and hygiene. Pt requires total cues for sequencing hand washing as she perseverated on washing sink handle and wiping floor with paper towel. Pt requires total assist to attend to left and directly in front of patient to locate blue  physioballs in gym and then count balls (3). Pt unable to use fingers to add up number of objects so attempted to use cups for counting. Pt counted each cup twice despite max verbal/visual cues. Pt with improved attention with basic and familiar task of counting stairs when negotiating up/down 2 flights in stairwell. Pt negotiated up/down 22 stairs using R rail ascending with min A overall and step-to pattern with seated rest at top of stairs. During seated rest, pt unable to recall how many children she has and perseverated on counting steps. At end of session, pt returned to gerichair with quick release belt on and lap tray in place and left sitting at RN station.   Therapy Documentation Precautions:  Precautions Precautions: Fall Precaution Comments: perseveration Restrictions Weight Bearing Restrictions: No Pain: Pain Assessment Pain Assessment: Faces Faces Pain Scale: No hurt  See FIM for current functional status  Therapy/Group: Individual Therapy  Laretta Alstrom 03/23/2014, 12:04 PM

## 2014-03-23 NOTE — Progress Notes (Signed)
[facility] PHYSICAL MEDICINE & REHABILITATION     PROGRESS NOTE    Subjective/Complaints: Up at nurses station when i arrived. Still not sleeping. Up most of night. Difficult to arouse again this am.  Objective: Vital Signs: Blood pressure 111/61, pulse 75, temperature 97.8 F (36.6 C), temperature source Oral, resp. rate 18, weight 52.753 kg (116 lb 4.8 oz), SpO2 98 %. No results found.  Recent Labs  03/20/14 1800  WBC 9.7  HGB 12.5  HCT 37.6  PLT 246    Recent Labs  03/20/14 1800  NA 135  K 4.2  CL 97  GLUCOSE 110*  BUN 13  CREATININE 0.62  CALCIUM 8.9   CBG (last 3)  No results for input(s): GLUCAP in the last 72 hours.  Wt Readings from Last 3 Encounters:  03/18/14 52.753 kg (116 lb 4.8 oz)  03/18/14 52.4 kg (115 lb 8.3 oz)  10/11/13 57.607 kg (127 lb)    Physical Exam:  Constitutional: She appears well-developed.  HENT:  Head: Normocephalic.  Eyes: EOM are normal.  Neck: Normal range of motion. Neck supple. No thyromegaly present.  Cardiovascular: Normal rate and regular rhythm.  Respiratory: Effort normal and breath sounds normal. No respiratory distress.  GI: Soft. Bowel sounds are normal. She exhibits no distension.  Neurological: She is alert.  Patient is lying in bed. Does not arouse to verbal stim or light tactile stim. I did not push her further due to reports that she didn't sleep. S   Prior motor exam (not done today): RUE: 5/5 Deltoid, 5/5 Biceps, 5/5 Triceps, 5/5 Wrist Ext, 5/5 Grip LUE: 5/5 Deltoid, 5/5 Biceps, 5/5 Triceps, 5/5 Wrist Ext, 5/5 Grip RLE: HF 5/5, KE 5/5, KF 5/5, ADF 5/5, APF 5/5 LLE: HF 5/5, KE 5/5, HF, 5/5, ADF 5/5, APF 5/5   Skin: Skin is warm and dry  Assessment/Plan: 1. Functional deficits secondary to SAH/right posterior communicating artery aneurysm which require 3+ hours per day of interdisciplinary therapy in a comprehensive inpatient rehab setting. Physiatrist is providing close team supervision and 24  hour management of active medical problems listed below. Physiatrist and rehab team continue to assess barriers to discharge/monitor patient progress toward functional and medical goals. FIM: FIM - Bathing Bathing Steps Patient Completed: Chest, Right Arm, Left Arm, Abdomen, Front perineal area, Right upper leg, Left upper leg Bathing: 3: Mod-Patient completes 5-7 32f 10 parts or 50-74%  FIM - Upper Body Dressing/Undressing Upper body dressing/undressing steps patient completed: Thread/unthread right sleeve of pullover shirt/dresss, Thread/unthread left sleeve of pullover shirt/dress, Put head through opening of pull over shirt/dress, Pull shirt over trunk Upper body dressing/undressing: 5: Supervision: Safety issues/verbal cues FIM - Lower Body Dressing/Undressing Lower body dressing/undressing steps patient completed: Thread/unthread right pants leg, Thread/unthread left pants leg, Pull pants up/down, Don/Doff right sock, Don/Doff left sock, Don/Doff right shoe, Don/Doff left shoe Lower body dressing/undressing: 4: Min-Patient completed 75 plus % of tasks  FIM - Toileting Toileting steps completed by patient: Adjust clothing prior to toileting, Performs perineal hygiene, Adjust clothing after toileting Toileting Assistive Devices: Grab bar or rail for support Toileting: 1: Total-Patient completed zero steps, helper did all 3  FIM - Radio producer Devices: Grab bars Toilet Transfers: 4-To toilet/BSC: Min A (steadying Pt. > 75%), 4-From toilet/BSC: Min A (steadying Pt. > 75%)  FIM - Bed/Chair Transfer Bed/Chair Transfer Assistive Devices: HOB elevated, Arm rests, Bed rails Bed/Chair Transfer: 0: Activity did not occur  FIM - Locomotion: Wheelchair Locomotion: Wheelchair: 1:  Total Assistance/staff pushes wheelchair (Pt<25%) FIM - Locomotion: Ambulation Locomotion: Ambulation Assistive Devices: Other (comment) (HHA) Ambulation/Gait Assistance: 3: Mod  assist Locomotion: Ambulation: 0: Activity did not occur  Comprehension Comprehension Mode: Auditory Comprehension: 3-Understands basic 50 - 74% of the time/requires cueing 25 - 50%  of the time  Expression Expression Mode: Verbal Expression: 3-Expresses basic 50 - 74% of the time/requires cueing 25 - 50% of the time. Needs to repeat parts of sentences.  Social Interaction Social Interaction: 2-Interacts appropriately 25 - 49% of time - Needs frequent redirection.  Problem Solving Problem Solving: 1-Solves basic less than 25% of the time - needs direction nearly all the time or does not effectively solve problems and may need a restraint for safety  Memory Memory: 2-Recognizes or recalls 25 - 49% of the time/requires cueing 51 - 75% of the time  Medical Problem List and Plan: 1. Functional deficits secondary to right-sided SAH/right posterior communicating artery aneurysm status post coiling 03/05/2014 with communicating hydrocephalus status post VP shunt 03/15/2014  -AMS---likely multifactorial due to hydrocephalus/SAH, poor sleep/wake, meds  -UA has come back positive----will begin empiric keflex, await culture  -meds should not be a problem now 2. DVT Prophylaxis/Anticoagulation: SCDs.   3. Pain Management: Tylenol as needed 4. Hyperlipidemia. Zocor 5. Neuropsych: This patient is not capable of making decisions on her own behalf.  -seroquel stopped due to sedation  -consider low dose trazodone at HS---will speak with therapy again today 6. Skin/Wound Care: Routine skin checks 7. Fluids/Electrolytes/Nutrition: chemistries ok  -ate well yesterday 8. Hypertension Nimotop protocol. Monitor with increased mobility LOS (Days) 5 A FACE TO FACE EVALUATION WAS PERFORMED  Garnett Nunziata T 03/23/2014 8:22 AM

## 2014-03-23 NOTE — Plan of Care (Signed)
Problem: RH PAIN MANAGEMENT Goal: RH STG PAIN MANAGED AT OR BELOW PT'S PAIN GOAL Pain level <4  Outcome: Progressing No c/o pain

## 2014-03-23 NOTE — Progress Notes (Signed)
Speech Language Pathology Daily Session Note  Patient Details  Name: Sharon [name] MRN: [id number] Date of Birth: [birth year]  Today's Date: 03/23/2014 SLP Individual Time: 1505-1605 SLP Individual Time Calculation (min): 60 min  Short Term Goals: Week 1: SLP Short Term Goal 1 (Week 1): Patient will demonstrate sustained attention to a functional task for 5 minutes with Mod A multimodal cues. SLP Short Term Goal 2 (Week 1): Patient will identify 2 physical and 2 cognitive deficits with Mod A multimodal cues.  SLP Short Term Goal 3 (Week 1): Patient will demonstrate functional problem solving for functional and familiar tasks with Mod A multimodal cues with 50% of opportunities.  SLP Short Term Goal 4 (Week 1): Patient will utilize the call bell to request assistance with Max A multimodal cues with 25% of opportunities SLP Short Term Goal 5 (Week 1): Patient will utilize small bites and a slow rate of self-feeding to minimize aspiration risk with current diet with Min A multimodal cues.  SLP Short Term Goal 6 (Week 1): Patient will utilize external memory aids to orient to date and situation with Mod A multimodal cues.   Skilled Therapeutic Interventions: Skilled treatment session focused on addressing cognitive goals. SLP facilitated session with Total multimodal cues for arousal and orientation.  Following SLP's initial orientation patient required Max multimodal cues for orientation to place, situation and date.  SLP created an external aid that patient was able to refer to for this information throughout session with Min cues.  Patient was able to copy basic biographical information such as name and address with Max cues to identify and correct perseverative errors.  Continue with current plan of care.    FIM:  Comprehension Comprehension Mode: Auditory Comprehension: 2-Understands basic 25 - 49% of the time/requires cueing 51 - 75% of the time Expression Expression Mode:  Verbal Expression: 3-Expresses basic 50 - 74% of the time/requires cueing 25 - 50% of the time. Needs to repeat parts of sentences. Social Interaction Social Interaction: 2-Interacts appropriately 25 - 49% of time - Needs frequent redirection. Problem Solving Problem Solving: 1-Solves basic less than 25% of the time - needs direction nearly all the time or does not effectively solve problems and may need a restraint for safety Memory Memory: 1-Recognizes or recalls less than 25% of the time/requires cueing greater than 75% of the time FIM - Eating Eating Activity: 4: Help with managing cup/glass  Pain Pain Assessment Pain Assessment: No/denies pain  Therapy/Group: Individual Therapy  Carmelia Roller., [place] [phone]  [place] 03/23/2014, 4:46 PM

## 2014-03-23 NOTE — Progress Notes (Signed)
Occupational Therapy Note  Patient Details  Name: Sharon [name] MRN: [id number] Date of Birth: 08-26-1934  Today's Date: 03/23/2014 OT Missed Time: 55 Minutes Missed Time Reason: Patient fatigue;Other (comment) (unable to arouse to participate in therapy)  Pt missed 60 mins skilled services.  Pt asleep in bed with son and SO present.  Unable to arouse patient adequately to actively participate in therapy.   Leotis Shames [facility] 03/23/2014, 9:38 AM

## 2014-03-24 ENCOUNTER — Encounter (HOSPITAL_COMMUNITY): Payer: PRIVATE HEALTH INSURANCE

## 2014-03-24 ENCOUNTER — Inpatient Hospital Stay (HOSPITAL_COMMUNITY): Payer: Medicare Other | Admitting: Speech Pathology

## 2014-03-24 ENCOUNTER — Inpatient Hospital Stay (HOSPITAL_COMMUNITY): Payer: Medicare Other

## 2014-03-24 MED ORDER — CIPROFLOXACIN HCL 250 MG PO TABS
250.0000 mg | ORAL_TABLET | Freq: Two times a day (BID) | ORAL | Status: AC
  Administered 2014-03-24 – 2014-03-31 (×14): 250 mg via ORAL
  Filled 2014-03-24 (×16): qty 1

## 2014-03-24 NOTE — Progress Notes (Addendum)
Occupational Therapy Session Note  Patient Details  Name: Sharon [name] MRN: [id number] Date of Birth: January 29, 1935  Today's Date: 03/25/2014 OT Individual Time:0900-1000 60 mins       Short Term Goals: Week 1:  OT Short Term Goal 1 (Week 1): Pt. will maintain standing balance during bathing with SBA OT Short Term Goal 2 (Week 1): Pt will gather clothes with SBA for balance and minimal cues for organization. OT Short Term Goal 3 (Week 1): Pt will demonstrate sustained attention for 10 minutes in bathing session OT Short Term Goal 4 (Week 1): Pt. will transfer to toilet with SBA OT Short Term Goal 5 (Week 1): Pt. will demonstrate intellectual awareness of deficits with minimal verabal cues.   Skilled Therapeutic Interventions/Progress Updates:    Pt asleep in recliner upon arrival with SO present.  Pt required mod multimodal cues to arouse but agreeable to bathing at shower level and dressing with sit<>stand from chair.  Pt amb with HHA to bathroom for shower.  Pt required max verbal cues for task initiation and sequencing during bathing tasks.  Pt initiated dressing tasks when presented with clothing items.  Pt stood at sink (steady A) to brush teeth and brush hair.  Pt amb with HHA to therapy gym and engaged in cognitive remediation activities with focus on orientation and historical data.  Pt required max/tot questioning cues/A for orientation.  Pt oriented to self and reason for being in hospital.  Pt admits she is unsure on some historical data.  Pt recognizes she has memory deficits as a result of bleed.  Pt engaged in pathfinding using signage to locate room.  Pt able to remember room number for approx 3 mins while locating room.  Pt returned to recliner, QRB in place and SO present.  NT to assist patient with breakfast.  Therapy Documentation Precautions:  Precautions Precautions: Fall Precaution Comments: perseveration Restrictions Weight Bearing Restrictions: No Pain:  Pt denied  pain  See FIM for current functional status  Therapy/Group: Individual Therapy  Leroy Libman 03/25/2014, 6:43 AM

## 2014-03-24 NOTE — Progress Notes (Signed)
Speech Language Pathology Daily Session Note  Patient Details  Name: Sharon [name] MRN: [id number] Date of Birth: 1934/03/26  Today's Date: 03/24/2014 SLP Individual Time: 1300-1400 SLP Individual Time Calculation (min): 60 min  Short Term Goals: Week 1: SLP Short Term Goal 1 (Week 1): Patient will demonstrate sustained attention to a functional task for 5 minutes with Mod A multimodal cues. SLP Short Term Goal 2 (Week 1): Patient will identify 2 physical and 2 cognitive deficits with Mod A multimodal cues.  SLP Short Term Goal 3 (Week 1): Patient will demonstrate functional problem solving for functional and familiar tasks with Mod A multimodal cues with 50% of opportunities.  SLP Short Term Goal 4 (Week 1): Patient will utilize the call bell to request assistance with Max A multimodal cues with 25% of opportunities SLP Short Term Goal 5 (Week 1): Patient will utilize small bites and a slow rate of self-feeding to minimize aspiration risk with current diet with Min A multimodal cues.  SLP Short Term Goal 6 (Week 1): Patient will utilize external memory aids to orient to date and situation with Mod A multimodal cues.   Skilled Therapeutic Interventions: Skilled treatment session focused on addressing cognitive goals. SLP facilitated session with Max multimodal cues for arousal with mobility to therapy room.  SLP facilitated session with Max multimodal cues: verbal and written choice of three for orientation to place and situation.  Patient also required Max multimodal cues for use of calendar for orientation to date.  Patient with increased difficulty with copying biographical infomration today as compared to yresterday's session with increased perseveration.  Patient's verbal expression also included more instances of language of confusion with perseverative errors with no awareness.  SLP suspects that today's performance was impacted by decreased sustained attention; with only brief periods of  sustained attention today.  Continue with current plan of care.    FIM:  Comprehension Comprehension Mode: Auditory Comprehension: 2-Understands basic 25 - 49% of the time/requires cueing 51 - 75% of the time Expression Expression Mode: Verbal Expression: 2-Expresses basic 25 - 49% of the time/requires cueing 50 - 75% of the time. Uses single words/gestures. Social Interaction Social Interaction: 2-Interacts appropriately 25 - 49% of time - Needs frequent redirection. Problem Solving Problem Solving: 1-Solves basic less than 25% of the time - needs direction nearly all the time or does not effectively solve problems and may need a restraint for safety Memory Memory: 1-Recognizes or recalls less than 25% of the time/requires cueing greater than 75% of the time  Pain Pain Assessment Pain Assessment: No/denies pain  Therapy/Group: Individual Therapy  Carmelia Roller., CCC-SLP [phone]  [place] 03/24/2014, 2:54 PM

## 2014-03-24 NOTE — Progress Notes (Signed)
Sharon [name] PHYSICAL MEDICINE & REHABILITATION     PROGRESS NOTE    Subjective/Complaints: Slept from about 11:30pm to around 5:30am. Woke up to go the bathroom and fell back asleep in chair. Sitter in room A  review of systems has been performed and if not noted above is otherwise negative.   Objective: Vital Signs: Blood pressure 105/51, pulse 71, temperature 97.3 F (36.3 C), temperature source Axillary, resp. rate 17, weight 52.7 kg (116 lb 2.9 oz), SpO2 99 %. No results found. No results for input(s): WBC, HGB, HCT, PLT in the last 72 hours. No results for input(s): NA, K, CL, GLUCOSE, BUN, CREATININE, CALCIUM in the last 72 hours.  Invalid input(s): CO CBG (last 3)  No results for input(s): GLUCAP in the last 72 hours.  Wt Readings from Last 3 Encounters:  03/24/14 52.7 kg (116 lb 2.9 oz)  03/18/14 52.4 kg (115 lb 8.3 oz)  10/11/13 57.607 kg (127 lb)    Physical Exam:  Constitutional: She appears well-developed.  HENT:  Head: Normocephalic.  Eyes: EOM are normal.  Neck: Normal range of motion. Neck supple. No thyromegaly present.  Cardiovascular: Normal rate and regular rhythm.  Respiratory: Effort normal and breath sounds normal. No respiratory distress.  GI: Soft. Bowel sounds are normal. She exhibits no distension.  Neurological: She is alert.  Patient is sitting in chair. Arouses grossly to verbal and tactile stim.    RUE: 5/5 Deltoid, 5/5 Biceps, 5/5 Triceps, 5/5 Wrist Ext, 5/5 Grip LUE: 5/5 Deltoid, 5/5 Biceps, 5/5 Triceps, 5/5 Wrist Ext, 5/5 Grip RLE: HF 5/5, KE 5/5, KF 5/5, ADF 5/5, APF 5/5 LLE: HF 5/5, KE 5/5, HF, 5/5, ADF 5/5, APF 5/5   Skin: Skin is warm and dry  Assessment/Plan: 1. Functional deficits secondary to SAH/right posterior communicating artery aneurysm which require 3+ hours per day of interdisciplinary therapy in a comprehensive inpatient rehab setting. Physiatrist is providing close team supervision and 24 hour management of  active medical problems listed below. Physiatrist and rehab team continue to assess barriers to discharge/monitor patient progress toward functional and medical goals. FIM: FIM - Bathing Bathing Steps Patient Completed: Chest, Right Arm, Left Arm, Abdomen, Front perineal area, Right upper leg, Left upper leg Bathing: 3: Mod-Patient completes 5-7 23f 10 parts or 50-74%  FIM - Upper Body Dressing/Undressing Upper body dressing/undressing steps patient completed: Thread/unthread right sleeve of pullover shirt/dresss, Thread/unthread left sleeve of pullover shirt/dress, Put head through opening of pull over shirt/dress, Pull shirt over trunk Upper body dressing/undressing: 5: Supervision: Safety issues/verbal cues FIM - Lower Body Dressing/Undressing Lower body dressing/undressing steps patient completed: Thread/unthread right pants leg, Thread/unthread left pants leg, Pull pants up/down, Don/Doff right sock, Don/Doff left sock, Don/Doff right shoe, Don/Doff left shoe Lower body dressing/undressing: 4: Min-Patient completed 75 plus % of tasks  FIM - Toileting Toileting steps completed by patient: Adjust clothing prior to toileting, Performs perineal hygiene, Adjust clothing after toileting Toileting Assistive Devices: Grab bar or rail for support Toileting: 4: Steadying assist  FIM - Radio producer Devices: Grab bars Toilet Transfers: 4-To toilet/BSC: Min A (steadying Pt. > 75%), 4-From toilet/BSC: Min A (steadying Pt. > 75%)  FIM - Bed/Chair Transfer Bed/Chair Transfer Assistive Devices: HOB elevated, Arm rests, Bed rails Bed/Chair Transfer: 4: Bed > Chair or W/C: Min A (steadying Pt. > 75%), 4: Chair or W/C > Bed: Min A (steadying Pt. > 75%)  FIM - Locomotion: Wheelchair Locomotion: Wheelchair: 0: Activity did not occur FIM -  Locomotion: Ambulation Locomotion: Ambulation Assistive Devices: Other (comment) (HHA or no AD) Ambulation/Gait Assistance: 4: Min  assist Locomotion: Ambulation: 2: Travels 50 - 149 ft with minimal assistance (Pt.>75%)  Comprehension Comprehension Mode: Auditory Comprehension: 2-Understands basic 25 - 49% of the time/requires cueing 51 - 75% of the time  Expression Expression Mode: Verbal Expression: 3-Expresses basic 50 - 74% of the time/requires cueing 25 - 50% of the time. Needs to repeat parts of sentences.  Social Interaction Social Interaction: 2-Interacts appropriately 25 - 49% of time - Needs frequent redirection.  Problem Solving Problem Solving: 1-Solves basic less than 25% of the time - needs direction nearly all the time or does not effectively solve problems and may need a restraint for safety  Memory Memory: 1-Recognizes or recalls less than 25% of the time/requires cueing greater than 75% of the time  Medical Problem List and Plan: 1. Functional deficits secondary to right-sided SAH/right posterior communicating artery aneurysm status post coiling 03/05/2014 with communicating hydrocephalus status post VP shunt 03/15/2014  -AMS---likely multifactorial due to hydrocephalus/SAH, poor sleep/wake, meds  -UA has come back positive---began empiric keflex, awaiting culture  -meds should not be a problem now 2. DVT Prophylaxis/Anticoagulation: SCDs.   3. Pain Management: Tylenol as needed 4. Hyperlipidemia. Zocor 5. Neuropsych: This patient is not capable of making decisions on her own behalf.  -seroquel stopped due to sedation  -added low dose trazodone at HS---appears it was effective last night 6. Skin/Wound Care: Routine skin checks 7. Fluids/Electrolytes/Nutrition: chemistries ok  -encourage adequate po 8. Hypertension Nimotop protocol. Monitor with increased mobility LOS (Days) 6 A FACE TO FACE EVALUATION WAS PERFORMED  SWARTZ,ZACHARY T 03/24/2014 8:09 AM

## 2014-03-24 NOTE — Progress Notes (Signed)
Physical Therapy Session Note  Patient Details  Name: Sharon [name] MRN: [id number] Date of Birth: 01-16-1935  Today's Date: 03/24/2014 PT Individual Time: 1530-1600 PT Individual Time Calculation (min): 30 min Makeup Session  Short Term Goals: Week 1:  PT Short Term Goal 1 (Week 1): Pt to perform functional transfers with close(S), 50% of time PT Short Term Goal 2 (Week 1): Pt to ambulate 200' with close(S), 50% of time PT Short Term Goal 3 (Week 1): Pt to maintain dynamic standing balance during functional tasks with close(S), 50% of time PT Short Term Goal 4 (Week 1): Pt to maintain sustained attention to funcitonal tasks with min cues PT Short Term Goal 5 (Week 1): Pt to attend to L side of environment during funcitonal tasks with min cues  Skilled Therapeutic Interventions/Progress Updates:   Pt received asleep in gerichair at [employer] station. Pt difficult to awaken, requiring increased time and max multimodal cues. Pt eventually agreeable to using bathroom. Ambulated to/from patient's room with min HHA and increased lateral lean left compared to ambulation yesterday. Pt able to perform toilet transfer, manage clothing, and perform hygiene with steadying assist and total cues for completing task. While attempting to wash hands at sink, pt perseverative and apraxic, requiring HOH assist to complete task. Pt's daughter Legrand Rams called room phone. After therapist answered phone, pt able to state "[name]" without daughter prompting her but unable to carry on conversation further before she began brushing hair with phone. Pt participated in visit with therapy dog, petting dog with cuing. Remainder of session focused on orientation and biographical information with therapist facilitating conversation with mod cues. Pt continuously attempting to sit or return to bed during session but easily redirected. Pt with decreased sustained attention and increased language of confusion and perseveration noted  today compared to yesterday. Pt returned to gerichair at end of session with quick release belt donned and lap tray in place.   Therapy Documentation Precautions:  Precautions Precautions: Fall Precaution Comments: perseveration Restrictions Weight Bearing Restrictions: No Vital Signs: Therapy Vitals Temp: 98.3 F (36.8 C) Temp Source: Oral Pulse Rate: 74 Resp: 17 BP: (!) 104/47 mmHg Patient Position (if appropriate): Sitting Oxygen Therapy SpO2: 100 % O2 Device: Not Delivered Pain: Pain Assessment Pain Assessment: Faces Faces Pain Scale: No hurt  See FIM for current functional status  Therapy/Group: Individual Therapy  Laretta Alstrom 03/24/2014, 4:06 PM

## 2014-03-24 NOTE — Progress Notes (Signed)
Pt very difficult to arouse during the night to give medications. Pt will sit up, lean to the left, ask to go back to sleep, but never open her eyes. It takes two people to sit her up and help get her alert to give medications. Kennieth Francois, RN

## 2014-03-24 NOTE — Progress Notes (Signed)
Social Work Patient ID: Darrick Grinder, female   DOB: [birth year], 80 y.o.   MRN: [id number]   Spoke with pt's daughter, Maudie Mercury, yesterday afternoon (via phone) to review team conference.  Aware and agreeable with targeted d/c date of 2/2 and with supervision goals.  Unfortunately, there are poor family dynamics going on at this time between pt's children.  Maudie Mercury is serving as my primary contact but she is agreeable with inclusion of her brother in the d/c planning process despite their tensions.  She reports that her brother had discussed option of SNF.  Explained to her that she may not be at SNF level but would discuss with team.  I also offered option of private duty care in the home which is an option per daughter.  Financially, pt could pay for this care but had also been on the waiting list at [facility] at one point.  Will contact SW at [facility] to investigate if their community would be an option.  Will keep team posted on d/c planning progress.  Amayrani Bennick, LCSW

## 2014-03-24 NOTE — Progress Notes (Signed)
Physical Therapy Make Up Session Note  Patient Details  Name: Sharon [name] MRN: [id number] Date of Birth: August 04, 1934  Today's Date: 03/24/2014 PT Individual Time: 1033 (make up session)-1103 PT Individual Time Calculation (min): 30 min   Short Term Goals: Week 1:  PT Short Term Goal 1 (Week 1): Pt to perform functional transfers with close(S), 50% of time PT Short Term Goal 2 (Week 1): Pt to ambulate 200' with close(S), 50% of time PT Short Term Goal 3 (Week 1): Pt to maintain dynamic standing balance during functional tasks with close(S), 50% of time PT Short Term Goal 4 (Week 1): Pt to maintain sustained attention to funcitonal tasks with min cues PT Short Term Goal 5 (Week 1): Pt to attend to L side of environment during funcitonal tasks with min cues  Skilled Therapeutic Interventions/Progress Updates:   Pt received asleep in geri chair at nursing station.  Requires approx 5-6 min to arouse in order to participate in make up session for PT.  Skilled session focused on gait, functional toilet transfers, and cognitive remediation with sequencing tasks, identifying tasks, orientation questions, and following single step commands.  Performed over 200' gait during session with min A HHA during session.  Facilitation for upright posture and increased hip extension during tasks.  Provided max to total A cues for orientation, even with visual and verbal hinting cues.  Also worked on Magazine features editor in kitchen by Runner, broadcasting/film/video.  Note fleeting attention throughout entire session and requires max to total A cues for re-direction during session.  Pt then states she needs to use restroom, therefore assisted into ADL apt bathroom.  Pt performed adjusting clothes prior to and following toileting, however PT assisted with peri care.  Note small amount of bowel incontinence, but did void more in toilet.  Pt then assisted to sink with max to total HOH cues and assist for sequencing of washing/drying  hands.  Ambulated back towards day room, attempting to identify colors.  She was unable to complete this task due to decreased attention.  Ended session in speech office, attempting to separate and identify colors (in fields of two to five), however requires total A cues and HOH assist during task.  Assisted back to geri care and donned quick release belt.  Assisted back to room to visit with friend.  RN and friend notified to tell nursing when he leaves so that she can be assisted to nursing station.    Therapy Documentation Precautions:  Precautions Precautions: Fall Precaution Comments: perseveration Restrictions Weight Bearing Restrictions: No  Pain: Pain Assessment Pain Assessment: No/denies pain Faces Pain Scale: No hurt   Locomotion : Ambulation Ambulation/Gait Assistance: 4: Min assist   See FIM for current functional status  Therapy/Group: Individual Therapy  Denice Bors 03/24/2014, 11:24 AM

## 2014-03-24 NOTE — Patient Care Conference (Signed)
Inpatient RehabilitationTeam Conference and Plan of Care Update Date: 03/23/2014   Time: 2:55 PM    Patient Name: Sharon [name]      Medical Record Number: [id number]  Date of Birth: 04-Jan-1935 Sex: Female         Room/Bed: 4W17C/4W17C-01 Payor Info: Payor: MEDICARE / Plan: MEDICARE PART A AND B / Product Type: *No Product type* /    Admitting Diagnosis: R SAH  VP Shunt   Admit Date/Time:  03/18/2014  2:28 PM Admission Comments: No comment available   Primary Diagnosis:  <principal problem not specified> Principal Problem: <principal problem not specified>  Patient Active Problem List   Diagnosis Date Noted  . Hydrocephalus 03/18/2014  . Delirium   . Posterior communicating artery aneurysm   . Communicating hydrocephalus   . Urinary tract infection, acute   . HLD (hyperlipidemia)   . Altered mental status   . Subarachnoid hemorrhage 03/06/2014  . Acute respiratory failure   . SAH (subarachnoid hemorrhage)   . Aneurysm   . Meningitis   . Acute encephalopathy 03/02/2014  . Headache 03/02/2014  . UTI (lower urinary tract infection) 03/02/2014  . Dyslipidemia 03/02/2014    Expected Discharge Date: Expected Discharge Date: 04/06/14  Team Members Present: Physician leading conference: Dr. Alger Simons Social Worker Present: Lennart Pall, LCSW Nurse Present: Elliot Cousin, RN PT Present: Melene Plan, PT OT Present: Roanna Epley, Griffin Basil, OT SLP Present: Other (comment) Rulon Eisenmenger, SLP) Other (Discipline and Name): Danne Baxter, RN [facility]) PPS Coordinator present : Daiva Nakayama, RN, CRRN     Current Status/Progress Goal Weekly Team Focus  Medical   sah with aneurysm, hydrocephalus, uti  improve mentation to allow better particaiption  uti rx, sleep-restoration   Bowel/Bladder   Continent of bowel and bladder at times. LBM 03/22/14  Pt to remain continent of bowel and bladder.  Offer toileting assistance q 2-3hours   Swallow/Nutrition/ Hydration   Min with Dys 3 and thin  supervision  increase use of strategies, advanced trials   ADL's   bathing/dressing-min A; functional transfers-min A; max verbal cues for cognition/orientation; max A/tot A for attention  supervision overall  cognitive remediation; functional transfers, attention, task initiation   Mobility   Supervision-min A for bed mobility; min-mod A for functional transfers, stair negotiation, balance and ambulation with HHA; Max overall cues for cognition  Supervision overall   activity tolerance, cognitive remediation, safety during funcitonal transfers and mobility, balance, strength   Communication             Safety/Cognition/ Behavioral Observations  Mod-Max  Min  basic problem solving, sustained attention, awareness, recall   Pain   No c/o pain  <3  Monitor for nonverbal cues of pain   Skin   Incision to mid-posterior and mid abd with staples approximated, OTA, unremarkable  No additional skin breakdown  Encourage turn q 2hrs    Rehab Goals Patient on target to meet rehab goals: Yes *See Care Plan and progress notes for long and short-term goals.  Barriers to Discharge: poor cognition, uti    Possible Resolutions to Barriers:  family ed, sleep restoration, rx uti    Discharge Planning/Teaching Needs:  home wtih family to arrange 24/7 care vs SNF      Team Discussion:  Poor sleep/ wake cycle and now with UTI - both affecting behavior and cognition.  Still with significant confusion.  Goals of supervision - SW to follow up further with family about their d/c plan.  Not  yet ready for neuropsych.  Revisions to Treatment Plan:  None   Continued Need for Acute Rehabilitation Level of Care: The patient requires daily medical management by a physician with specialized training in physical medicine and rehabilitation for the following conditions: Daily direction of a multidisciplinary physical rehabilitation program to ensure safe treatment while eliciting the  highest outcome that is of practical value to the patient.: Yes Daily medical management of patient stability for increased activity during participation in an intensive rehabilitation regime.: Yes Daily analysis of laboratory values and/or radiology reports with any subsequent need for medication adjustment of medical intervention for : Neurological problems;Post surgical problems  Sharon [name] 03/24/2014, 6:47 AM

## 2014-03-24 NOTE — Progress Notes (Signed)
Physical Therapy Session Note  Patient Details  Name: Sharon [name] MRN: [id number] Date of Birth: Jun 05, 1934  Today's Date: 03/24/2014 PT Individual Time: 0800-0900 PT Individual Time Calculation (min): 60 min   Short Term Goals: Week 1:  PT Short Term Goal 1 (Week 1): Pt to perform functional transfers with close(S), 50% of time PT Short Term Goal 2 (Week 1): Pt to ambulate 200' with close(S), 50% of time PT Short Term Goal 3 (Week 1): Pt to maintain dynamic standing balance during functional tasks with close(S), 50% of time PT Short Term Goal 4 (Week 1): Pt to maintain sustained attention to funcitonal tasks with min cues PT Short Term Goal 5 (Week 1): Pt to attend to L side of environment during funcitonal tasks with min cues  Skilled Therapeutic Interventions/Progress Updates:    Pt received seated in geri chair, agreeable to participate in therapy. Session focused on ambulation, route finding, cognitive remediation. Pt ambulated around rehab unit w/ L HHA and MinA w/ intermittent LOB to L requiring MinA to correct. Pt able to find route to family room and to rehab gym w/ min cueing to use signs in hallway, then required max cueing to find route to room due to increased distracting environment. Instructed pt in higher level activity of letter replacing ("replace a letter in the word with one of these letters to make another word") with pt initially completing at 100% accuracy with no cueing progressing to 50% accuracy w/ mod cueing with fatigue. Switched to number activity ("pick two numbers that add up to X") w/ pt initially completing at 75% accuracy w/ min cueing progressing to 25% accuracy w/ mod cueing w/ increased fatigue. Towards end of session pt expressed increased fatigue and asked to lay down on mat table in gym. Pt fell asleep on mat table and required mod multimodal cueing to awake and ambulate back to room. Suspect pt may benefit from shorter therapy sessions due to cognitive  overload demonstrated. Session ended in pt's room, where pt was left seated in geri chair w/ quick release belt and lap tray on w/ all needs within reach. OT informed of pt's status as bathing/dressing session scheduled to immediately follow PT.  Therapy Documentation Precautions:  Precautions Precautions: Fall Precaution Comments: perseveration Restrictions Weight Bearing Restrictions: No Pain: Pain Assessment Pain Assessment: Faces Pain Score: Asleep Faces Pain Scale: No hurt Locomotion : Ambulation Ambulation/Gait Assistance: 4: Min assist   See FIM for current functional status  Therapy/Group: Individual Therapy  Rada Hay  Rada Hay, PT, DPT 03/24/2014, 9:45 AM

## 2014-03-24 NOTE — Progress Notes (Signed)
Patient seen and examined yesterday. Patient up in the hall participating in therapy. Still remains mildly confused but very much improved preop. Wounds healing well. No fever. No evidence of infection or other issue.  Overall progressing well following subarachnoid hemorrhage and subsequent VP shunting for hydrocephalus. Continue current rehabilitation efforts. No new recommendations.

## 2014-03-24 NOTE — Progress Notes (Signed)
Social Work Lennart Pall, [employer] Social Worker Signed  Patient Care Conference 03/24/2014  6:47 AM    Expand All Collapse All   Inpatient RehabilitationTeam Conference and Plan of Care Update Date: 03/23/2014   Time: 2:55 PM     Patient Name: Sharon [name]       Medical Record Number: [id number]  Date of Birth: 08/02/1934 Sex: Female         Room/Bed: 4W17C/4W17C-01 Payor Info: Payor: MEDICARE / Plan: MEDICARE PART A AND B / Product Type: *No Product type* /    Admitting Diagnosis: R SAH  VP Shunt   Admit Date/Time:  03/18/2014  2:28 PM Admission Comments: No comment available   Primary Diagnosis:  <principal problem not specified> Principal Problem: <principal problem not specified>    Patient Active Problem List     Diagnosis  Date Noted   .  Hydrocephalus  03/18/2014   .  Delirium     .  Posterior communicating artery aneurysm     .  Communicating hydrocephalus     .  Urinary tract infection, acute     .  HLD (hyperlipidemia)     .  Altered mental status     .  Subarachnoid hemorrhage  03/06/2014   .  Acute respiratory failure     .  SAH (subarachnoid hemorrhage)     .  Aneurysm     .  Meningitis     .  Acute encephalopathy  03/02/2014   .  Headache  03/02/2014   .  UTI (lower urinary tract infection)  03/02/2014   .  Dyslipidemia  03/02/2014     Expected Discharge Date: Expected Discharge Date: 04/06/14  Team Members Present: Physician leading conference: Dr. Alger Simons Social Worker Present: Lennart Pall, LCSW Nurse Present: Elliot Cousin, RN PT Present: Melene Plan, PT OT Present: Roanna Epley, Griffin Basil, OT SLP Present: Other (comment) Rulon Eisenmenger, SLP) Other (Discipline and Name): Danne Baxter, RN [facility]) PPS Coordinator present : Daiva Nakayama, RN, CRRN        Current Status/Progress  Goal  Weekly Team Focus   Medical     sah with aneurysm, hydrocephalus, uti   improve mentation to allow better particaiption  uti rx, sleep-restoration    Bowel/Bladder     Continent of bowel and bladder at times. LBM 03/22/14   Pt to remain continent of bowel and bladder.   Offer toileting assistance q 2-3hours    Swallow/Nutrition/ Hydration     Min with Dys 3 and thin  supervision  increase use of strategies, advanced trials    ADL's     bathing/dressing-min A; functional transfers-min A; max verbal cues for cognition/orientation; max A/tot A for attention   supervision overall  cognitive remediation; functional transfers, attention, task initiation   Mobility     Supervision-min A for bed mobility; min-mod A for functional transfers, stair negotiation, balance and ambulation with HHA; Max overall cues for cognition  Supervision overall   activity tolerance, cognitive remediation, safety during funcitonal transfers and mobility, balance, strength    Communication               Safety/Cognition/ Behavioral Observations    Mod-Max  Min  basic problem solving, sustained attention, awareness, recall    Pain     No c/o pain  <3  Monitor for nonverbal cues of pain    Skin     Incision to mid-posterior and mid abd with staples approximated, OTA, unremarkable  No additional skin breakdown  Encourage turn q 2hrs    Rehab Goals Patient on target to meet rehab goals: Yes *See Care Plan and progress notes for long and short-term goals.    Barriers to Discharge:  poor cognition, uti     Possible Resolutions to Barriers:   family ed, sleep restoration, rx uti      Discharge Planning/Teaching Needs:   home wtih family to arrange 24/7 care vs SNF       Team Discussion:    Poor sleep/ wake cycle and now with UTI - both affecting behavior and cognition.  Still with significant confusion.  Goals of supervision - SW to follow up further with family about their d/c plan.  Not yet ready for neuropsych.   Revisions to Treatment Plan:    None    Continued Need for Acute Rehabilitation Level of Care: The patient requires daily medical management  by a physician with specialized training in physical medicine and rehabilitation for the following conditions: Daily direction of a multidisciplinary physical rehabilitation program to ensure safe treatment while eliciting the highest outcome that is of practical value to the patient.: Yes Daily medical management of patient stability for increased activity during participation in an intensive rehabilitation regime.: Yes Daily analysis of laboratory values and/or radiology reports with any subsequent need for medication adjustment of medical intervention for : Neurological problems;Post surgical problems  Joden Bonsall 03/24/2014, 6:47 AM                  Patient ID: Sharon [name], female   DOB: 01/05/1935, 80 y.o.   MRN: [id number]

## 2014-03-25 ENCOUNTER — Inpatient Hospital Stay (HOSPITAL_COMMUNITY): Payer: Medicare Other

## 2014-03-25 ENCOUNTER — Encounter (HOSPITAL_COMMUNITY): Payer: PRIVATE HEALTH INSURANCE

## 2014-03-25 ENCOUNTER — Inpatient Hospital Stay (HOSPITAL_COMMUNITY): Payer: Medicare Other | Admitting: Speech Pathology

## 2014-03-25 LAB — URINE CULTURE: Colony Count: >=100000

## 2014-03-25 MED ORDER — METHYLPHENIDATE HCL 5 MG PO TABS
5.0000 mg | ORAL_TABLET | Freq: Two times a day (BID) | ORAL | Status: DC
  Administered 2014-03-25 – 2014-03-28 (×6): 5 mg via ORAL
  Filled 2014-03-25 (×6): qty 1

## 2014-03-25 NOTE — Progress Notes (Signed)
[facility] PHYSICAL MEDICINE & REHABILITATION     PROGRESS NOTE    Subjective/Complaints: Slept well again last night. Participating with OT when I arrived this morning. Brushing her teeth. Has fair attention but needs redirection and sometimes help initiating.  Fatiguing a lot later in the day per staff and my observation---can become very restless prior A  review of systems has been performed and if not noted above is otherwise negative.   Objective: Vital Signs: Blood pressure 102/54, pulse 75, temperature 97.4 F (36.3 C), temperature source Oral, resp. rate 14, weight 52.7 kg (116 lb 2.9 oz), SpO2 98 %. No results found. No results for input(s): WBC, HGB, HCT, PLT in the last 72 hours. No results for input(s): NA, K, CL, GLUCOSE, BUN, CREATININE, CALCIUM in the last 72 hours.  Invalid input(s): CO CBG (last 3)  No results for input(s): GLUCAP in the last 72 hours.  Wt Readings from Last 3 Encounters:  03/24/14 52.7 kg (116 lb 2.9 oz)  03/18/14 52.4 kg (115 lb 8.3 oz)  10/11/13 57.607 kg (127 lb)    Physical Exam:  Constitutional: She appears well-developed.  HENT:  Head: Normocephalic.  Eyes: EOM are normal.  Neck: Normal range of motion. Neck supple. No thyromegaly present.  Cardiovascular: Normal rate and regular rhythm.  Respiratory: Effort normal and breath sounds normal. No respiratory distress.  GI: Soft. Bowel sounds are normal. She exhibits no distension.  Neurological: She is alert.  Patient is sitting in chair. Arouses grossly to verbal and tactile stim.    RUE: 5/5 Deltoid, 5/5 Biceps, 5/5 Triceps, 5/5 Wrist Ext, 5/5 Grip LUE: 5/5 Deltoid, 5/5 Biceps, 5/5 Triceps, 5/5 Wrist Ext, 5/5 Grip RLE: HF 5/5, KE 5/5, KF 5/5, ADF 5/5, APF 5/5 LLE: HF 5/5, KE 5/5, HF, 5/5, ADF 5/5, APF 5/5   Skin: Skin is warm and dry  Assessment/Plan: 1. Functional deficits secondary to SAH/right posterior communicating artery aneurysm which require 3+ hours per day of  interdisciplinary therapy in a comprehensive inpatient rehab setting. Physiatrist is providing close team supervision and 24 hour management of active medical problems listed below. Physiatrist and rehab team continue to assess barriers to discharge/monitor patient progress toward functional and medical goals. FIM: FIM - Bathing Bathing Steps Patient Completed: Front perineal area, Right upper leg, Left upper leg, Left Arm Bathing: 2: Max-Patient completes 3-4 38f 10 parts or 25-49%  FIM - Upper Body Dressing/Undressing Upper body dressing/undressing steps patient completed: Thread/unthread right sleeve of pullover shirt/dresss, Thread/unthread left sleeve of pullover shirt/dress, Put head through opening of pull over shirt/dress, Pull shirt over trunk Upper body dressing/undressing: 5: Supervision: Safety issues/verbal cues FIM - Lower Body Dressing/Undressing Lower body dressing/undressing steps patient completed: Thread/unthread right underwear leg, Thread/unthread left underwear leg, Pull underwear up/down, Thread/unthread right pants leg, Thread/unthread left pants leg, Pull pants up/down, Don/Doff right sock, Don/Doff left sock, Don/Doff right shoe, Fasten/unfasten right shoe, Don/Doff left shoe, Fasten/unfasten left shoe Lower body dressing/undressing: 4: Steadying Assist  FIM - Toileting Toileting steps completed by patient: Adjust clothing prior to toileting, Adjust clothing after toileting Toileting Assistive Devices: Grab bar or rail for support Toileting: 3: Mod-Patient completed 2 of 3 steps  FIM - Radio producer Devices: Grab bars Toilet Transfers: 4-To toilet/BSC: Min A (steadying Pt. > 75%), 4-From toilet/BSC: Min A (steadying Pt. > 75%)  FIM - Bed/Chair Transfer Bed/Chair Transfer Assistive Devices: HOB elevated, Arm rests, Bed rails Bed/Chair Transfer: 4: Bed > Chair or W/C: Min A (  steadying Pt. > 75%), 4: Chair or W/C > Bed: Min A (steadying  Pt. > 75%), 5: Supine > Sit: Supervision (verbal cues/safety issues), 5: Sit > Supine: Supervision (verbal cues/safety issues)  FIM - Locomotion: Wheelchair Locomotion: Wheelchair: 0: Activity did not occur FIM - Locomotion: Ambulation Locomotion: Ambulation Assistive Devices: Other (comment) (HHA) Ambulation/Gait Assistance: 4: Min assist Locomotion: Ambulation: 4: Travels 150 ft or more with minimal assistance (Pt.>75%)  Comprehension Comprehension Mode: Auditory Comprehension: 2-Understands basic 25 - 49% of the time/requires cueing 51 - 75% of the time  Expression Expression Mode: Verbal Expression: 2-Expresses basic 25 - 49% of the time/requires cueing 50 - 75% of the time. Uses single words/gestures.  Social Interaction Social Interaction: 2-Interacts appropriately 25 - 49% of time - Needs frequent redirection.  Problem Solving Problem Solving: 1-Solves basic less than 25% of the time - needs direction nearly all the time or does not effectively solve problems and may need a restraint for safety  Memory Memory: 1-Recognizes or recalls less than 25% of the time/requires cueing greater than 75% of the time  Medical Problem List and Plan: 1. Functional deficits secondary to right-sided SAH/right posterior communicating artery aneurysm status post coiling 03/05/2014 with communicating hydrocephalus status post VP shunt 03/15/2014  -AMS---likely multifactorial due to hydrocephalus/SAH, poor sleep/wake, meds  -UCX with 100k Pseudomonas--abx changed to cipro, sensitivities pending 2. DVT Prophylaxis/Anticoagulation: SCDs.   3. Pain Management: Tylenol as needed 4. Hyperlipidemia. Zocor 5. Neuropsych: This patient is not capable of making decisions on her own behalf.   -trial of low dose ritalin to improve day time arousal, focus/attention  -continue low dose trazodone at HS which has helped 6. Skin/Wound Care: Routine skin checks 7. Fluids/Electrolytes/Nutrition: chemistries  ok  -encourage adequate po 8. Hypertension Nimotop protocol.  LOS (Days) 7 A FACE TO FACE EVALUATION WAS PERFORMED  SWARTZ,ZACHARY T 03/25/2014 8:49 AM

## 2014-03-25 NOTE — Progress Notes (Signed)
Speech Language Pathology Weekly Progress Note  Patient Details  Name: Sharon [name] MRN: [id number] Date of Birth: 10/18/1934  Beginning of progress report period: March 19, 2014 End of progress report period: March 25, 2014  Short Term Goals: Week 1: SLP Short Term Goal 1 (Week 1): Patient will demonstrate sustained attention to a functional task for 5 minutes with Mod A multimodal cues. SLP Short Term Goal 1 - Progress (Week 1): Revised due to lack of progress SLP Short Term Goal 2 (Week 1): Patient will identify 2 physical and 2 cognitive deficits with Mod A multimodal cues.  SLP Short Term Goal 2 - Progress (Week 1): Revised due to lack of progress SLP Short Term Goal 3 (Week 1): Patient will demonstrate functional problem solving for functional and familiar tasks with Mod A multimodal cues with 50% of opportunities.  SLP Short Term Goal 3 - Progress (Week 1): Revised due to lack of progress SLP Short Term Goal 4 (Week 1): Patient will utilize the call bell to request assistance with Max A multimodal cues with 25% of opportunities SLP Short Term Goal 4 - Progress (Week 1): Progressing toward goal SLP Short Term Goal 5 (Week 1): Patient will utilize small bites and a slow rate of self-feeding to minimize aspiration risk with current diet with Min A multimodal cues.  SLP Short Term Goal 5 - Progress (Week 1): Progressing toward goal SLP Short Term Goal 6 (Week 1): Patient will utilize external memory aids to orient to date and situation with Mod A multimodal cues.  SLP Short Term Goal 6 - Progress (Week 1): Progressing toward goal    New Short Term Goals: Week 2: SLP Short Term Goal 1 (Week 2): Patient will demonstrate sustained attention to a functional task for 1 minute with Max A multimodal cues. SLP Short Term Goal 2 (Week 2): Patient will identify 1 cognitive deficit with Mod A multimodal cues.  SLP Short Term Goal 3 (Week 2): Patient will demonstrate basic problem solving  for functional, familiar tasks with Mod A multimodal cues with 50% of opportunities.  SLP Short Term Goal 4 (Week 2): Patient will utilize the call bell to request assistance with Max A multimodal cues with 25% of opportunities SLP Short Term Goal 5 (Week 2): Patient will utilize small bites and a slow rate of self-feeding to minimize aspiration risk with current diet with Mod A multimodal cues.  SLP Short Term Goal 6 (Week 2): Patient will utilize external memory aids to orient to date and situation with Mod A multimodal cues.   Weekly Progress Updates: Patient has made minimal functional gains and as a result has met 0 of 6 short term goals this reporting period.  Patient's impaired sustained attention has resulted in inability to complete all higher level tasks without Max-Total assist.  During brief 3-5 seconds of sustained attention patient is able to utilize presented external aids and accurately answer biographical questions.  Given that patient continues to require Max-Total assist for all basic self-care tasks and family education is ongoing, patient continues to require skilled SLP services in order to maximize her functional independence prior to discharge home with 24/7 assist.    Intensity: Minumum of 1-2 x/day, 30 to 90 minutes Frequency: 5 out of 7 days Duration/Length of Stay: 2/2 Treatment/Interventions: Cognitive remediation/compensation;Cueing hierarchy;Dysphagia/aspiration precaution training;Environmental controls;Functional tasks;Internal/external aids;Patient/family education;Therapeutic Activities   Carmelia Roller., CCC-SLP [phone]   [place] 03/25/2014, 5:20 PM

## 2014-03-25 NOTE — Progress Notes (Signed)
Midnight dose of Nimotop not able to be given. Attempted to give medication for 20 minutes. Patient hard to arouse; sternal rub used.  Once alert, refused to take medication with many attempts. Patient sat on side of bed.  FYI trazadone 25mg  bedtime dose given at 2048.

## 2014-03-25 NOTE — Progress Notes (Signed)
Physical Therapy Session Note  Patient Details  Name: Sharon [name] MRN: [id number] Date of Birth: 1934/08/15  Today's Date: 03/25/2014 PT Individual Time: 1400-1430 PT Individual Time Calculation (min): 30 min   Short Term Goals: Week 1:  PT Short Term Goal 1 (Week 1): Pt to perform functional transfers with close(S), 50% of time PT Short Term Goal 2 (Week 1): Pt to ambulate 200' with close(S), 50% of time PT Short Term Goal 3 (Week 1): Pt to maintain dynamic standing balance during functional tasks with close(S), 50% of time PT Short Term Goal 4 (Week 1): Pt to maintain sustained attention to funcitonal tasks with min cues PT Short Term Goal 5 (Week 1): Pt to attend to L side of environment during funcitonal tasks with min cues  Skilled Therapeutic Interventions/Progress Updates:  1:1. Pt received sitting at table working with SLP, PT taking over. Focus this session on activity tolerance, safety during functional mobility and transfers as well as cognitive remediation. Pt demonstrating significantly improved alertness this session with no cues to maintain EO. Despite improved alertness, pt continued to req min A while ambulating 250'x2 around unit to utilize external cues to address orientation. Pt req overall max cues for orientation to place, time and situation. Pt engaged in assembling basic pipe tree diagram with pieces initially positioned on L side to facilitate L awareness. In quiet gym, pt req min cues for sustained attention to task but overall max cues for problem solving with only min-mod cues when choosing piece from field of 3. Pt req overall max cues for disassembling diagram upon completion due to perseveration on continuation of assembly. Pt left sitting in geri chair asleep at end of session with all needs in reach, quick release belt in place and significant other in room.   Therapy Documentation Precautions:  Precautions Precautions: Fall Precaution Comments:  perseveration Restrictions Weight Bearing Restrictions: No  See FIM for current functional status  Therapy/Group: Individual Therapy  Annelise, [place]cc[place] 03/25/2014, 4:41 PM

## 2014-03-25 NOTE — Progress Notes (Signed)
Speech Language Pathology Daily Session Note  Patient Details  Name: MILANI LOWENSTEIN MRN: [id number] Date of Birth: 12-06-1934  Today's Date: 03/25/2014 SLP Individual Time: 1330-1400 SLP Individual Time Calculation (min): 30 min  Short Term Goals: Week 1: SLP Short Term Goal 1 (Week 1): Patient will demonstrate sustained attention to a functional task for 5 minutes with Mod A multimodal cues. SLP Short Term Goal 2 (Week 1): Patient will identify 2 physical and 2 cognitive deficits with Mod A multimodal cues.  SLP Short Term Goal 3 (Week 1): Patient will demonstrate functional problem solving for functional and familiar tasks with Mod A multimodal cues with 50% of opportunities.  SLP Short Term Goal 4 (Week 1): Patient will utilize the call bell to request assistance with Max A multimodal cues with 25% of opportunities SLP Short Term Goal 5 (Week 1): Patient will utilize small bites and a slow rate of self-feeding to minimize aspiration risk with current diet with Min A multimodal cues.  SLP Short Term Goal 6 (Week 1): Patient will utilize external memory aids to orient to date and situation with Mod A multimodal cues.   Skilled Therapeutic Interventions: Skilled treatment session focused on cognitive goals, specifically maintaining sustained attention to orientation tasks with written stimuli. SLP facilitated session by providing Max verbal, written, and tactile cues in the form of verbally providing two or three choices, phrase completion, and phonemic cues. Patient required Max multimodal support to complete and attend to therapeutic activities of using a calendar to determine today's date and answering questions about biographical information. With Max multimodal support, Patient was able to maintain sustained attention in a relatively quiet environment for no more than 5 seconds at a time. Patient's verbal output continues to include instances of perseveration and language of confusion with no  awareness of deficits. Recommend continue with current plan of care.   FIM:  Comprehension Comprehension Mode: Auditory Comprehension: 2-Understands basic 25 - 49% of the time/requires cueing 51 - 75% of the time Expression Expression Mode: Verbal Expression: 1-Expresses basis less than 25% of the time/requires cueing greater than 75% of the time. Social Interaction Social Interaction: 2-Interacts appropriately 25 - 49% of time - Needs frequent redirection. Problem Solving Problem Solving: 1-Solves basic less than 25% of the time - needs direction nearly all the time or does not effectively solve problems and may need a restraint for safety Memory Memory: 1-Recognizes or recalls less than 25% of the time/requires cueing greater than 75% of the time   Pain Pain Assessment Pain Assessment: No/denies pain  Therapy/Group: Individual Therapy  Servando Snare 03/25/2014, 3:44 PM

## 2014-03-25 NOTE — Progress Notes (Signed)
Physical Therapy Session Note  Patient Details  Name: Sharon [name] MRN: [id number] Date of Birth: 23-Jul-1934  Today's Date: 03/25/2014 PT Individual Time: 0900-1000 PT Individual Time Calculation (min): 60 min   Short Term Goals: Week 1:  PT Short Term Goal 1 (Week 1): Pt to perform functional transfers with close(S), 50% of time PT Short Term Goal 2 (Week 1): Pt to ambulate 200' with close(S), 50% of time PT Short Term Goal 3 (Week 1): Pt to maintain dynamic standing balance during functional tasks with close(S), 50% of time PT Short Term Goal 4 (Week 1): Pt to maintain sustained attention to funcitonal tasks with min cues PT Short Term Goal 5 (Week 1): Pt to attend to L side of environment during funcitonal tasks with min cues  Skilled Therapeutic Interventions/Progress Updates:  1:1. Pt received sitting in geri-chair asleep. Pt req max multimodal simuli (positional changes, auditory, noxious) x32minutes to arouse, open eyes and attend to therapist on R side only. Focus this session on alertness/arousal, focused>sustained attention, orientation, sequencing during functional tasks, safety during functional transfers and mobility as well as B LE strength/endurance. Pt rubbing head at start of session, req prompting to state that she had a headache. RN aware and present to provide medicine during session. Pt oriented to person and situation only, total A for place and time. Pt initially req total A for attention, decreasing to max cues for focused>sustained attention during functional tasks by end of session.  Conversation limited by language of confusion throughout session.   Pt req total cues with HOH assist to complete toileting, washing hands at sink due and getting drink at nurses station due to poor focused attention and apraxia.   Due to lethargy, pt engaged in multiple standing activities to maintain alertness/arousal including min A for ambulation 200-250'x4 bouts with HHA with  therapist facilitating speed changes- noted increased trunk flexion and antalgic pattern with increased pace but no report of increased pain, min A for negotiation up/down 5 steps with R rail + L HHA demonstrating reciprocal pattern, min A to maintain balance while tossing ball during ambulation with second person. Pt with good tolerance to use of NuStep for B UE/LE strength/endurance as well as sustained attention, level 2x65min with max cues.   Pt left sleeping in geri chair at end of session with quick release belt in place, all needs in reach and significant other in room. Significant other verbalized understanding regarding need to notify RN if he was to leave.   Therapy Documentation Precautions:  Precautions Precautions: Fall Precaution Comments: perseveration Restrictions Weight Bearing Restrictions: No  Pain: Pain Assessment Pain Assessment: Faces Pain Score: Asleep Faces Pain Scale: Hurts even more Pain Type: Acute pain Pain Location: Head Pain Orientation: Anterior Pain Descriptors / Indicators: Headache Pain Onset: On-going Pain Intervention(s): RN made aware;Distraction;Rest   See FIM for current functional status  Therapy/Group: Individual Therapy  [name], [name] 03/25/2014, 12:10 PM

## 2014-03-25 NOTE — Progress Notes (Signed)
Occupational Therapy Weekly Progress Note  Patient Details  Name: Sharon [name] MRN: [id number] Date of Birth: Feb 08, 1935  Beginning of progress report period: March 19, 2014 End of progress report period: March 25, 2014   Patient has met 2 of 5 short term goals.  Pt progress has been minimal with BADLs and cognition since admission.  Pt missed 2 therapy sessions secondary decreased arousal.  Pt continues to require max multimodal cues to arouse when sleeping.  Pt requires max verbal cues for attention to task, orientation, and awareness of deficits.  Pt exhibits significant bathing apraxia and requires max multimodal cues and HOH assistance to initiate and complete bathing tasks.  Pt is consistently oriented to person only.  Pt is oriented to situation approx 50%.  Pt requires tot A for orientation to place and date.    Patient continues to demonstrate the following deficits: decreased attention, decreased memory, decreased safety awareness, decreased dynamic standing balance, decreased task initiation , decreased functional mobility, decreased functional transfers, L inattention, and decreased motor planning and therefore will continue to benefit from skilled OT intervention to enhance overall performance with BADL.  Patient progressing toward long term goals.  Continue plan of care.  OT Short Term Goals Week 1:  OT Short Term Goal 1 (Week 1): Pt. will maintain standing balance during bathing with SBA OT Short Term Goal 1 - Progress (Week 1): Met OT Short Term Goal 2 (Week 1): Pt will gather clothes with SBA for balance and minimal cues for organization. OT Short Term Goal 2 - Progress (Week 1): Progressing toward goal OT Short Term Goal 3 (Week 1): Pt will demonstrate sustained attention for 10 minutes in bathing session OT Short Term Goal 3 - Progress (Week 1): Progressing toward goal OT Short Term Goal 4 (Week 1): Pt. will transfer to toilet with SBA OT Short Term Goal 4 - Progress  (Week 1): Met OT Short Term Goal 5 (Week 1): Pt. will demonstrate intellectual awareness of deficits with minimal verabal cues.  OT Short Term Goal 5 - Progress (Week 1): Progressing toward goal Week 2:  OT Short Term Goal 1 (Week 2): Pt will gather clothing in preparation for BADLs with steady A OT Short Term Goal 2 (Week 2): Pt will demonstrate sustained attention for 10 mins during bathing session OT Short Term Goal 3 (Week 2): Pt will demonstrate intellectual awareness of deficitis with min verbal cues OT Short Term Goal 4 (Week 2): Pt will initiate bathing tasks with min multimodal cues  Skilled Therapeutic Interventions/Progress Updates:      Therapy Documentation Precautions:  Precautions Precautions: Fall Precaution Comments: perseveration Restrictions Weight Bearing Restrictions: No  See FIM for current functional status  T Leroy Libman 03/25/2014, 3:01 PM

## 2014-03-25 NOTE — Progress Notes (Signed)
Occupational Therapy Session Note  Patient Details  Name: Sharon [name] MRN: [id number] Date of Birth: [birth year]  Today's Date: 03/25/2014 OT Individual Time: 0700-0800 OT Individual Time Calculation (min): 60 min    Short Term Goals: Week 1:  OT Short Term Goal 1 (Week 1): Pt. will maintain standing balance during bathing with SBA OT Short Term Goal 2 (Week 1): Pt will gather clothes with SBA for balance and minimal cues for organization. OT Short Term Goal 3 (Week 1): Pt will demonstrate sustained attention for 10 minutes in bathing session OT Short Term Goal 4 (Week 1): Pt. will transfer to toilet with SBA OT Short Term Goal 5 (Week 1): Pt. will demonstrate intellectual awareness of deficits with minimal verabal cues.   Skilled Therapeutic Interventions/Progress Updates:    Pt asleep in recliner with SO present upon arrival.  Pt required mod multimodal cues for arousal but agreeable to bathing at shower level and dressing.  Pt required mod HHA to amb to bathroom for bathing.  Pt exhibits significant bathing apraxia and requires max multimodal cues for task initiation, sequencing, and redirection to task.  Pt perseverates on wringing out wash cloth.  Pt completed all dressing tasks appropriately when presented with clothing items.  Pt required tot A for orientation to place, situation, and date.  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  Pt engaged in path finding to room and required tot A to locate room.  Focus on orientation, cognitive remediation, BADL retraining, standing balance, task initiation, sequencing, attention to task, and safety awareness.    Therapy Documentation Precautions:  Precautions Precautions: Fall Precaution Comments: perseveration Restrictions Weight Bearing Restrictions: No   Pain: Pain Assessment Pain Assessment: Faces Faces Pain Scale: No hurt  See FIM for current functional status  Therapy/Group:  Individual Therapy  Leroy Libman 03/25/2014, 8:00 AM

## 2014-03-26 ENCOUNTER — Inpatient Hospital Stay (HOSPITAL_COMMUNITY): Payer: Medicare Other | Admitting: Speech Pathology

## 2014-03-26 ENCOUNTER — Inpatient Hospital Stay (HOSPITAL_COMMUNITY): Payer: PRIVATE HEALTH INSURANCE

## 2014-03-26 ENCOUNTER — Encounter (HOSPITAL_COMMUNITY): Payer: PRIVATE HEALTH INSURANCE

## 2014-03-26 NOTE — Progress Notes (Addendum)
Physical Therapy Session Note  Patient Details  Name: Sharon [name] MRN: [id number] Date of Birth: March 18, 1934  Today's Date: 03/26/2014 PT Individual Time: 1000-1115 PT Individual Time Calculation (min): 75 min   Short Term Goals: Week 1:  PT Short Term Goal 1 (Week 1): Pt to perform functional transfers with close(S), 50% of time PT Short Term Goal 2 (Week 1): Pt to ambulate 200' with close(S), 50% of time PT Short Term Goal 3 (Week 1): Pt to maintain dynamic standing balance during functional tasks with close(S), 50% of time PT Short Term Goal 4 (Week 1): Pt to maintain sustained attention to funcitonal tasks with min cues PT Short Term Goal 5 (Week 1): Pt to attend to L side of environment during funcitonal tasks with min cues  Skilled Therapeutic Interventions/Progress Updates:  1:1. Pt received sitting on toilet in care of RN, PT stepping in to assist. Pt req total A for LB dressing change due to incontinence, but max assistance for pericare and to pull up pants. Focus this session on orientation, focused>sustained attention, safety during functional transfers and ambulation, sequencing of functional tasks, balance and core strength. Pt req overall min guard-min A for all functional transfers and ambulation this session 200-300'x5 bouts with HHA. Pt continues to present with antalgic gait due to intermittent reports of L hip pain with prompting, flexed trunk and variable leaning to B sides. Total A for orientation to place, time and situation this session with use of external aids. Conversation significantly limited throughout session due to language of confusion.   Pt req overall max cues for L awareness and seq of functional tasks this session including managing pants after toileting, washing hands at sink and making bed in therapy apartment.   Pt initially presenting with very poor focused attention, eventually able to demonstrate sustained attention up to 75min in quiet environments  with mod-max cues for redirection, including: looking out window while talking about snow, making bed in therapy apartment, performing yoga on floor mat.   Pt engaged in yoga on floor to address floor transfer, req min A for safe completion, as well as target core strength with various poses in quadruped, long sitting and "V" sitting with variable B UE/LE support.   Pt very fatigued at end of session, left sitting in geri chair with quick release belt in place, all needs within reach and daughter in room. Pt req prompting to indicate that she had a mild headache, RN aware.   Daughter with a number of questions regarding patient's care at end of session outside of physical therapist's scope of clinical practice. LCSW not currently available, but therapist offered to make PA-C or RN aware to address concerns as able. Daughter stating, "no don't even bother, it's not worth their time." RN made aware to follow up as able/appropriate.   Therapy Documentation Precautions:  Precautions Precautions: Fall Precaution Comments: perseveration Restrictions Weight Bearing Restrictions: No  See FIM for current functional status  Therapy/Group: Individual Therapy  [place], [place] 03/26/2014, 12:14 PM

## 2014-03-26 NOTE — Significant Event (Deleted)
Hypoglycemic Event  CBG: 68  Treatment: 15 GM gel  Symptoms: None  Follow-up CBG: [id number] CBG Result:85  Possible Reasons for Event: Inadequate meal intake  Comments/MD notified:Kirstiens MD    Cherlynn Polo  Remember to initiate Hypoglycemia Order Set & complete

## 2014-03-26 NOTE — Progress Notes (Signed)
Speech Language Pathology Daily Session Note  Patient Details  Name: Sharon [name] MRN: [id number] Date of Birth: April 20, 1934  Today's Date: 03/26/2014 SLP Individual Time: 1300-1345 SLP Individual Time Calculation (min): 45 min  Short Term Goals: Week 2: SLP Short Term Goal 1 (Week 2): Patient will demonstrate sustained attention to a functional task for 1 minute with Max A multimodal cues. SLP Short Term Goal 2 (Week 2): Patient will identify 1 cognitive deficit with Mod A multimodal cues.  SLP Short Term Goal 3 (Week 2): Patient will demonstrate basic problem solving for functional, familiar tasks with Mod A multimodal cues with 50% of opportunities.  SLP Short Term Goal 4 (Week 2): Patient will utilize the call bell to request assistance with Max A multimodal cues with 25% of opportunities SLP Short Term Goal 5 (Week 2): Patient will utilize small bites and a slow rate of self-feeding to minimize aspiration risk with current diet with Mod A multimodal cues.  SLP Short Term Goal 6 (Week 2): Patient will utilize external memory aids to orient to date and situation with Mod A multimodal cues.   Skilled Therapeutic Interventions: Skilled treatment session focused on cognitive goals. SLP facilitated session by providing Max verbal and tactile cues in the form of verbally providing two or three choices, phrase completion, and phonemic cues for orientation to place, time and situation. Patient required Max multimodal support to complete and attend to therapeutic activities of using a calendar to determine today's date and answering questions about biographical information.  Patient also required Max A multimodal cues for focused attention for ~5-10 seconds to a functional, verbal description task as well as maintaining the topic of conversation. Patient's verbal expression is most consistent with language of confusion at this time and patient's daughter educated on patient's current  cognitive-linguistic function and goals of skilled SLP intervention.  Daughter verbalized understanding. Patient left upright in [place] with daughter present. Continue with current plan of care.    FIM:  Comprehension Comprehension Mode: Auditory Comprehension: 2-Understands basic 25 - 49% of the time/requires cueing 51 - 75% of the time Expression Expression Mode: Verbal Expression: 1-Expresses basis less than 25% of the time/requires cueing greater than 75% of the time. Social Interaction Social Interaction: 2-Interacts appropriately 25 - 49% of time - Needs frequent redirection. Problem Solving Problem Solving: 1-Solves basic less than 25% of the time - needs direction nearly all the time or does not effectively solve problems and may need a restraint for safety Memory Memory: 1-Recognizes or recalls less than 25% of the time/requires cueing greater than 75% of the time  Pain Pain Assessment Pain Assessment: No/denies pain  Therapy/Group: Individual Therapy  Sydne Krahl 03/26/2014, 3:01 PM

## 2014-03-26 NOTE — Progress Notes (Signed)
PHYSICAL MEDICINE & REHABILITATION     PROGRESS NOTE    Subjective/Complaints: Third good night of sleep. Still fatigues in the afternoon. Attention and focus remain issues. A  review of systems has been performed and if not noted above is otherwise negative.   Objective: Vital Signs: Blood pressure 99/55, pulse 62, temperature 98.9 F (37.2 C), temperature source Oral, resp. rate 16, weight 52.7 kg (116 lb 2.9 oz), SpO2 94 %. No results found. No results for input(s): WBC, HGB, HCT, PLT in the last 72 hours. No results for input(s): NA, K, CL, GLUCOSE, BUN, CREATININE, CALCIUM in the last 72 hours.  Invalid input(s): CO CBG (last 3)  No results for input(s): GLUCAP in the last 72 hours.  Wt Readings from Last 3 Encounters:  03/24/14 52.7 kg (116 lb 2.9 oz)  03/18/14 52.4 kg (115 lb 8.3 oz)  10/11/13 57.607 kg (127 lb)    Physical Exam:  Constitutional: She appears well-developed.  HENT:  Head: Normocephalic.  Eyes: EOM are normal.  Neck: Normal range of motion. Neck supple. No thyromegaly present.  Cardiovascular: Normal rate and regular rhythm.  Respiratory: Effort normal and breath sounds normal. No respiratory distress.  GI: Soft. Bowel sounds are normal. She exhibits no distension.  Neurological: She is alert.  Patient is sitting in chair. Arouses grossly to verbal and tactile stim.    RUE: 5/5 Deltoid, 5/5 Biceps, 5/5 Triceps, 5/5 Wrist Ext, 5/5 Grip LUE: 5/5 Deltoid, 5/5 Biceps, 5/5 Triceps, 5/5 Wrist Ext, 5/5 Grip RLE: HF 5/5, KE 5/5, KF 5/5, ADF 5/5, APF 5/5 LLE: HF 5/5, KE 5/5, HF, 5/5, ADF 5/5, APF 5/5   Skin: Skin is warm and dry  Assessment/Plan: 1. Functional deficits secondary to SAH/right posterior communicating artery aneurysm which require 3+ hours per day of interdisciplinary therapy in a comprehensive inpatient rehab setting. Physiatrist is providing close team supervision and 24 hour management of active medical problems listed  below. Physiatrist and rehab team continue to assess barriers to discharge/monitor patient progress toward functional and medical goals. FIM: FIM - Bathing Bathing Steps Patient Completed: Front perineal area, Right upper leg, Left upper leg, Left Arm Bathing: 2: Max-Patient completes 3-4 93f 10 parts or 25-49%  FIM - Upper Body Dressing/Undressing Upper body dressing/undressing steps patient completed: Thread/unthread right sleeve of pullover shirt/dresss, Thread/unthread left sleeve of pullover shirt/dress, Put head through opening of pull over shirt/dress, Pull shirt over trunk Upper body dressing/undressing: 5: Supervision: Safety issues/verbal cues FIM - Lower Body Dressing/Undressing Lower body dressing/undressing steps patient completed: Thread/unthread right underwear leg, Thread/unthread left underwear leg, Pull underwear up/down, Thread/unthread right pants leg, Thread/unthread left pants leg, Pull pants up/down, Don/Doff right sock, Don/Doff left sock, Don/Doff right shoe, Fasten/unfasten right shoe, Don/Doff left shoe, Fasten/unfasten left shoe Lower body dressing/undressing: 4: Steadying Assist  FIM - Toileting Toileting steps completed by patient: Adjust clothing prior to toileting, Adjust clothing after toileting Toileting Assistive Devices: Grab bar or rail for support Toileting: 3: Mod-Patient completed 2 of 3 steps  FIM - Radio producer Devices: Grab bars Toilet Transfers: 4-To toilet/BSC: Min A (steadying Pt. > 75%), 4-From toilet/BSC: Min A (steadying Pt. > 75%)  FIM - Bed/Chair Transfer Bed/Chair Transfer Assistive Devices: Arm rests Bed/Chair Transfer: 4: Bed > Chair or W/C: Min A (steadying Pt. > 75%), 4: Chair or W/C > Bed: Min A (steadying Pt. > 75%)  FIM - Locomotion: Wheelchair Locomotion: Wheelchair: 0: Activity did not occur FIM - Locomotion: Ambulation  Locomotion: Ambulation Assistive Devices: Other (comment)  (HHA) Ambulation/Gait Assistance: 4: Min assist Locomotion: Ambulation: 4: Travels 150 ft or more with minimal assistance (Pt.>75%)  Comprehension Comprehension Mode: Auditory Comprehension: 2-Understands basic 25 - 49% of the time/requires cueing 51 - 75% of the time  Expression Expression Mode: Verbal Expression: 1-Expresses basis less than 25% of the time/requires cueing greater than 75% of the time.  Social Interaction Social Interaction: 2-Interacts appropriately 25 - 49% of time - Needs frequent redirection.  Problem Solving Problem Solving: 1-Solves basic less than 25% of the time - needs direction nearly all the time or does not effectively solve problems and may need a restraint for safety  Memory Memory: 1-Recognizes or recalls less than 25% of the time/requires cueing greater than 75% of the time  Medical Problem List and Plan: 1. Functional deficits secondary to right-sided SAH/right posterior communicating artery aneurysm status post coiling 03/05/2014 with communicating hydrocephalus status post VP shunt 03/15/2014  -AMS---likely multifactorial due to hydrocephalus/SAH, poor sleep/wake, meds  - Pseudomonas sensitive to cipro--continue for 7 day course 2. DVT Prophylaxis/Anticoagulation: SCDs.   3. Pain Management: Tylenol as needed 4. Hyperlipidemia. Zocor 5. Neuropsych: This patient is not capable of making decisions on her own behalf.   -continue low dose ritalin to improve day time arousal, focus/attention  -continue low dose trazodone at HS which has helped 6. Skin/Wound Care: Routine skin checks 7. Fluids/Electrolytes/Nutrition: chemistries ok  -encourage adequate po 8. Hypertension Nimotop protocol.  LOS (Days) 8 A FACE TO FACE EVALUATION WAS PERFORMED  Crimson Beer T 03/26/2014 9:15 AM

## 2014-03-26 NOTE — Progress Notes (Signed)
Occupational Therapy Session Note  Patient Details  Name: Sharon [name] MRN: [id number] Date of Birth: May 02, 1934  Today's Date: 03/26/2014 OT Individual Time: 0800-0900 OT Individual Time Calculation (min): 60 min    Short Term Goals: Week 2:  OT Short Term Goal 1 (Week 2): Pt will gather clothing in preparation for BADLs with steady A OT Short Term Goal 2 (Week 2): Pt will demonstrate sustained attention for 10 mins during bathing session OT Short Term Goal 3 (Week 2): Pt will demonstrate intellectual awareness of deficitis with min verbal cues OT Short Term Goal 4 (Week 2): Pt will initiate bathing tasks with min multimodal cues  Skilled Therapeutic Interventions/Progress Updates:    Pt engaged in BADL retraining including bathing at shower level and dressing with sit<>stand from recliner.  Pt amb with min HHA to bathroom for shower.  Pt washed face and refused to bathe any other body parts stating that she had already bathed them.  Pt became adamant that she didn't need to bathe anything else.  Pt initiated all dressing tasks when presented with clothing items.  Pt oriented to person and situation only.  Pt unable to answer questions intelligibly this morning.  Pt's sentence structure was very tangential this morning and was unable to answer any historical questions correctly.  Focus on orientation, task initiation, sequencing, cognitive remediation, path finding, functional amb with HHA, and safety awareness.  Therapy Documentation Precautions:  Precautions Precautions: Fall Precaution Comments: perseveration Restrictions Weight Bearing Restrictions: No  Pain: Pain Assessment Pain Assessment: Faces Faces Pain Scale: No hurt  See FIM for current functional status  Therapy/Group: Individual Therapy  Leroy Libman 03/26/2014, 10:50 AM

## 2014-03-26 NOTE — Progress Notes (Signed)
Per safety sitter, patient sat up, took covers off and started climbing out of bed; Sitter attempted to get patient into bathroom to toilet; patient sat in chair by bed and started urinated. Sitter called for help using call bell.  Patient cleaned up, gowned changed, 0000 Nimotop dose given, and put back to bed. Patient very confused and not speaking clearly. Will continue to monitor.

## 2014-03-27 ENCOUNTER — Inpatient Hospital Stay (HOSPITAL_COMMUNITY): Payer: Medicare Other | Admitting: Occupational Therapy

## 2014-03-27 MED ORDER — TRAZODONE HCL 50 MG PO TABS
50.0000 mg | ORAL_TABLET | Freq: Every day | ORAL | Status: DC
  Administered 2014-03-27 – 2014-04-04 (×9): 50 mg via ORAL
  Filled 2014-03-27 (×9): qty 1

## 2014-03-27 NOTE — Progress Notes (Signed)
Physical Therapy Weekly Progress Note  Patient Details  Name: Sharon [name] MRN: [id number] Date of Birth: 05/15/1934  Beginning of progress report period: March 19, 2014 End of progress report period: March 27, 2014  Pt has made very inconsistent and limited functional gains since admission and as a result has met 0/5 STGs, see details below. Over past week, pt has demonstrated improved arousal/alertness at times, however, when fatigued can req max-total multimodal cues x3mnutes to wake. Pt's overall cognition remains very impaired, req max-total cues for orientation, focused>sustained attention, problem solving and memory. Due to decreased attention, balance and strength, pt currently req overall min guard-min HHA for safety during completion of all transfers and standing mobility including ambulation up to 250'. Pt's LOS extended to address ongoing cognitive and physical impairments.     Patient continues to demonstrate the following deficits: decreased activity tolerance, decreased focused/sustained attention, decreased intellectual awareness, decreased orientation, decreased memory, decreased problem solving, decreased motor planning, apraxia, L inattention, decreased initiation, perseveration, decreased balance, decreased strength, decreased safety awareness, decreased overall functional transfers and mobility and therefore will continue to benefit from skilled PT intervention to enhance overall performance with activity tolerance, balance, postural control, attention, awareness and functional transfers and ambulation.   Patient not progressing toward long term goals.  See goal revision.  Plan of care revisions: Cognitive goals downgraded due to anticipated need for increased cues at time of d/c.   PT Short Term Goals Week 1:  PT Short Term Goal 1 (Week 1): Pt to perform functional transfers with close(S), 50% of time PT Short Term Goal 1 - Progress (Week 1): Progressing toward  goal PT Short Term Goal 2 (Week 1): Pt to ambulate 200' with close(S), 50% of time PT Short Term Goal 2 - Progress (Week 1): Progressing toward goal PT Short Term Goal 3 (Week 1): Pt to maintain dynamic standing balance during functional tasks with close(S), 50% of time PT Short Term Goal 3 - Progress (Week 1): Progressing toward goal PT Short Term Goal 4 (Week 1): Pt to maintain sustained attention to funcitonal tasks with min cues PT Short Term Goal 4 - Progress (Week 1): Revised due to lack of progress PT Short Term Goal 5 (Week 1): Pt to attend to L side of environment during funcitonal tasks with min cues PT Short Term Goal 5 - Progress (Week 1): Revised due to lack of progress Week 2:  PT Short Term Goal 1 (Week 2): Pt to perform functional transfers with close(S), 50% of time PT Short Term Goal 2 (Week 2): Pt will ambulate 200' with close(S), 50% of time PT Short Term Goal 3 (Week 2): Pt to maintain dynamic standing balance during functional tasks with close(S), 50% of time PT Short Term Goal 4 (Week 2): Pt will demonstrate sustained attention to functional task up to 153m with max cues PT Short Term Goal 5 (Week 2): Pt will attend to objects on L side during funcitonal tasks with mod cues  Therapy Documentation Precautions:  Precautions Precautions: Fall Precaution Comments: perseveration Restrictions Weight Bearing Restrictions: No  See FIM for current functional status  KiDiani, Jillson/23/2016, 12:40 PM

## 2014-03-27 NOTE — Progress Notes (Signed)
[facility] PHYSICAL MEDICINE & REHABILITATION     PROGRESS NOTE    Subjective/Complaints: Didn't sleep as well last night. Only 2-3 hours. Up at RN station when I arrived but then more lethargic later after she went back to room. Daughter upset at perceived regression.  A  review of systems has been performed and if not noted above is otherwise negative.   Objective: Vital Signs: Blood pressure 108/51, pulse 82, temperature 98 F (36.7 C), temperature source Oral, resp. rate 20, weight 52.7 kg (116 lb 2.9 oz), SpO2 96 %. No results found. No results for input(s): WBC, HGB, HCT, PLT in the last 72 hours. No results for input(s): NA, K, CL, GLUCOSE, BUN, CREATININE, CALCIUM in the last 72 hours.  Invalid input(s): CO CBG (last 3)  No results for input(s): GLUCAP in the last 72 hours.  Wt Readings from Last 3 Encounters:  03/24/14 52.7 kg (116 lb 2.9 oz)  03/18/14 52.4 kg (115 lb 8.3 oz)  10/11/13 57.607 kg (127 lb)    Physical Exam:  Constitutional: She appears well-developed.  HENT:  Head: Normocephalic.  Eyes: EOM are normal.  Neck: Normal range of motion. Neck supple. No thyromegaly present.  Cardiovascular: Normal rate and regular rhythm.  Respiratory: Effort normal and breath sounds normal. No respiratory distress.  GI: Soft. Bowel sounds are normal. She exhibits no distension.  Neurological: She is alert.  Patient is sitting in chair. Distracted. Verbal. Needs redirection. Non-agitated.    RUE: 5/5 Deltoid, 5/5 Biceps, 5/5 Triceps, 5/5 Wrist Ext, 5/5 Grip LUE: 5/5 Deltoid, 5/5 Biceps, 5/5 Triceps, 5/5 Wrist Ext, 5/5 Grip RLE: HF 5/5, KE 5/5, KF 5/5, ADF 5/5, APF 5/5 LLE: HF 5/5, KE 5/5, HF, 5/5, ADF 5/5, APF 5/5   Skin: Skin is warm and dry  Assessment/Plan: 1. Functional deficits secondary to SAH/right posterior communicating artery aneurysm which require 3+ hours per day of interdisciplinary therapy in a comprehensive inpatient rehab  setting. Physiatrist is providing close team supervision and 24 hour management of active medical problems listed below. Physiatrist and rehab team continue to assess barriers to discharge/monitor patient progress toward functional and medical goals. FIM: FIM - Bathing Bathing Steps Patient Completed: Right upper leg, Left upper leg Bathing: 2: Max-Patient completes 3-4 8f 10 parts or 25-49%  FIM - Upper Body Dressing/Undressing Upper body dressing/undressing steps patient completed: Thread/unthread right sleeve of pullover shirt/dresss, Thread/unthread left sleeve of pullover shirt/dress, Put head through opening of pull over shirt/dress, Pull shirt over trunk Upper body dressing/undressing: 5: Supervision: Safety issues/verbal cues FIM - Lower Body Dressing/Undressing Lower body dressing/undressing steps patient completed: Thread/unthread right underwear leg, Thread/unthread left underwear leg, Pull underwear up/down, Thread/unthread right pants leg, Thread/unthread left pants leg, Pull pants up/down, Don/Doff right sock, Don/Doff left sock, Don/Doff right shoe, Fasten/unfasten right shoe, Don/Doff left shoe, Fasten/unfasten left shoe Lower body dressing/undressing: 4: Steadying Assist  FIM - Toileting Toileting steps completed by patient: Performs perineal hygiene, Adjust clothing after toileting Toileting Assistive Devices: Grab bar or rail for support Toileting: 3: Mod-Patient completed 2 of 3 steps  FIM - Radio producer Devices: Grab bars Toilet Transfers: 4-From toilet/BSC: Min A (steadying Pt. > 75%)  FIM - Bed/Chair Transfer Bed/Chair Transfer Assistive Devices: Arm rests Bed/Chair Transfer: 4: Bed > Chair or W/C: Min A (steadying Pt. > 75%), 4: Chair or W/C > Bed: Min A (steadying Pt. > 75%), 4: Supine > Sit: Min A (steadying Pt. > 75%/lift 1 leg), 4: Sit >  Supine: Min A (steadying pt. > 75%/lift 1 leg)  FIM - Locomotion: Wheelchair Locomotion:  Wheelchair: 0: Activity did not occur FIM - Locomotion: Ambulation Locomotion: Ambulation Assistive Devices: Other (comment) (R HHA) Ambulation/Gait Assistance: 4: Min assist, 4: Min guard Locomotion: Ambulation: 4: Travels 150 ft or more with minimal assistance (Pt.>75%)  Comprehension Comprehension Mode: Auditory Comprehension: 2-Understands basic 25 - 49% of the time/requires cueing 51 - 75% of the time  Expression Expression Mode: Verbal Expression: 1-Expresses basis less than 25% of the time/requires cueing greater than 75% of the time.  Social Interaction Social Interaction: 2-Interacts appropriately 25 - 49% of time - Needs frequent redirection.  Problem Solving Problem Solving: 1-Solves basic less than 25% of the time - needs direction nearly all the time or does not effectively solve problems and may need a restraint for safety  Memory Memory: 1-Recognizes or recalls less than 25% of the time/requires cueing greater than 75% of the time  Medical Problem List and Plan: 1. Functional deficits secondary to right-sided SAH/right posterior communicating artery aneurysm status post coiling 03/05/2014 with communicating hydrocephalus status post VP shunt 03/15/2014  -AMS---likely multifactorial due to hydrocephalus/SAH, poor sleep/wake, meds  - Pseudomonas sensitive to cipro--continue for 7 day course  -spent time discussing scenario with daughter kim. Described the multiple factors affecting her recovery.   -had been showing progress over the last 3 days prior to this morning. Suspect poor sleep had large role. Increase trazodone to 50mg  tonight. 2. DVT Prophylaxis/Anticoagulation: SCDs.   3. Pain Management: Tylenol as needed 4. Hyperlipidemia. Zocor 5. Neuropsych: This patient is not capable of making decisions on her own behalf.   -continue low dose ritalin to improve day time arousal, focus/attention  -increase trazodone. 6. Skin/Wound Care: Routine skin checks 7.  Fluids/Electrolytes/Nutrition: chemistries ok  -encourage adequate po 8. Hypertension Nimotop protocol.  LOS (Days) 9 A FACE TO FACE EVALUATION WAS PERFORMED  SWARTZ,ZACHARY T 03/27/2014 11:09 AM

## 2014-03-27 NOTE — Progress Notes (Signed)
Occupational Therapy Session Note  Patient Details  Name: Sharon [name] MRN: [id number] Date of Birth: August 16, 1934  Today's Date: 03/27/2014 OT Individual Time: 6578-4696 OT Individual Time Calculation (min): 10 min  and Today's Date: 03/27/2014 OT Missed Time: 20 Minutes Missed Time Reason: Other (comment) (Pt with decreased arousal and unable to participate)   Short Term Goals: Week 2:  OT Short Term Goal 1 (Week 2): Pt will gather clothing in preparation for BADLs with steady A OT Short Term Goal 2 (Week 2): Pt will demonstrate sustained attention for 10 mins during bathing session OT Short Term Goal 3 (Week 2): Pt will demonstrate intellectual awareness of deficitis with min verbal cues OT Short Term Goal 4 (Week 2): Pt will initiate bathing tasks with min multimodal cues  Skilled Therapeutic Interventions/Progress Updates:  Upon arrival to room, pt seated in recliner chair sleeping with daughter present in room. Daughter reporting, "Good luck." Pt with decreased arousal and therapist attempting sternal rub and cold wash cloth to face in an attempt to awaken patient. Pt showed signs of feeling unpleasant stimulus from rub but kept eyes shut. Therapist attempting for 10 minutes to awaken patient and discussing with daughter in room. Daughter left room and pt taken to RN station for supervision.    Therapy Documentation Precautions:  Precautions Precautions: Fall Precaution Comments: perseveration Restrictions Weight Bearing Restrictions: No General: General OT Amount of Missed Time: 20 Minutes  See FIM for current functional status  Therapy/Group: Individual Therapy  Phineas Semen 03/27/2014, 3:49 PM

## 2014-03-27 NOTE — Plan of Care (Signed)
Problem: RH Memory Goal: LTG Patient demonstrate ability for day to day recall (PT) LTG: Patient will demonstrate ability for day to day recall/carryover during mobility activities with assist (PT)  Downgraded due to anticipated need for increased cues at time of d/c.   Problem: RH Attention Goal: LTG Patient will demonstrate focused/sustained (PT) LTG: Patient will demonstrate focused/sustained/selective/alternating/divided attention during functional mobility in specific environment with assist for # of minutes (PT)  Downgraded due to anticipated need for increased cues at time of d/c.   Problem: RH Awareness Goal: LTG: Patient will demonstrate intellectual/emergent (PT) LTG: Patient will demonstrate intellectual/emergent/anticipatory awareness with assist during a mobility activity (PT)  Downgraded due to anticipated need for increased cues at time of d/c.

## 2014-03-28 ENCOUNTER — Inpatient Hospital Stay (HOSPITAL_COMMUNITY): Payer: PRIVATE HEALTH INSURANCE | Admitting: *Deleted

## 2014-03-28 LAB — CBC
HCT: 33.3 % — ABNORMAL LOW (ref 36.0–46.0)
HEMOGLOBIN: 10.7 g/dL — AB (ref 12.0–15.0)
MCH: 31.5 pg (ref 26.0–34.0)
MCHC: 32.1 g/dL (ref 30.0–36.0)
MCV: 97.9 fL (ref 78.0–100.0)
Platelets: 224 10*3/uL (ref 150–400)
RBC: 3.4 MIL/uL — ABNORMAL LOW (ref 3.87–5.11)
RDW: 13.6 % (ref 11.5–15.5)
WBC: 6.6 10*3/uL (ref 4.0–10.5)

## 2014-03-28 LAB — COMPREHENSIVE METABOLIC PANEL
ALT: 12 U/L (ref 0–35)
AST: 20 U/L (ref 0–37)
Albumin: 2.8 g/dL — ABNORMAL LOW (ref 3.5–5.2)
Alkaline Phosphatase: 54 U/L (ref 39–117)
Anion gap: 8 (ref 5–15)
BUN: 11 mg/dL (ref 6–23)
CHLORIDE: 99 mmol/L (ref 96–112)
CO2: 28 mmol/L (ref 19–32)
Calcium: 8.5 mg/dL (ref 8.4–10.5)
Creatinine, Ser: 0.68 mg/dL (ref 0.50–1.10)
GFR calc non Af Amer: 82 mL/min — ABNORMAL LOW (ref >90)
GLUCOSE: 99 mg/dL (ref 70–99)
POTASSIUM: 4.2 mmol/L (ref 3.5–5.1)
Sodium: 135 mmol/L (ref 135–145)
Total Bilirubin: 0.2 mg/dL — ABNORMAL LOW (ref 0.3–1.2)
Total Protein: 5.1 g/dL — ABNORMAL LOW (ref 6.0–8.3)

## 2014-03-28 MED ORDER — METHYLPHENIDATE HCL 5 MG PO TABS
10.0000 mg | ORAL_TABLET | Freq: Two times a day (BID) | ORAL | Status: DC
  Administered 2014-03-28 – 2014-04-05 (×16): 10 mg via ORAL
  Filled 2014-03-28 (×18): qty 2

## 2014-03-28 MED ORDER — METHYLPHENIDATE HCL 5 MG PO TABS
5.0000 mg | ORAL_TABLET | Freq: Once | ORAL | Status: AC
  Administered 2014-03-28: 5 mg via ORAL
  Filled 2014-03-28: qty 1

## 2014-03-28 NOTE — Progress Notes (Signed)
[facility] PHYSICAL MEDICINE & REHABILITATION     PROGRESS NOTE    Subjective/Complaints: Pt slept much better overnight. 8-9 hours. Just woke up before i arrived on unit. Remains confused.  A  review of systems has been performed and if not noted above is otherwise negative.   Objective: Vital Signs: Blood pressure 96/61, pulse 67, temperature 98.5 F (36.9 C), temperature source Oral, resp. rate 18, weight 52.7 kg (116 lb 2.9 oz), SpO2 96 %. No results found.  Recent Labs  03/28/14 0410  WBC 6.6  HGB 10.7*  HCT 33.3*  PLT 224    Recent Labs  03/28/14 0410  NA 135  K 4.2  CL 99  GLUCOSE 99  BUN 11  CREATININE 0.68  CALCIUM 8.5   CBG (last 3)  No results for input(s): GLUCAP in the last 72 hours.  Wt Readings from Last 3 Encounters:  03/24/14 52.7 kg (116 lb 2.9 oz)  03/18/14 52.4 kg (115 lb 8.3 oz)  10/11/13 57.607 kg (127 lb)    Physical Exam:  Constitutional: She appears well-developed.  HENT:  Head: Normocephalic.  Eyes: EOM are normal.  Neck: Normal range of motion. Neck supple. No thyromegaly present.  Cardiovascular: Normal rate and regular rhythm.  Respiratory: Effort normal and breath sounds normal. No respiratory distress.  GI: Soft. Bowel sounds are normal. She exhibits no distension.  Neurological: She is alert.  Patient is sitting in chair. Distracted. Verbal. Needs redirection. Non-agitated.    RUE: 5/5 Deltoid, 5/5 Biceps, 5/5 Triceps, 5/5 Wrist Ext, 5/5 Grip LUE: 5/5 Deltoid, 5/5 Biceps, 5/5 Triceps, 5/5 Wrist Ext, 5/5 Grip RLE: HF 5/5, KE 5/5, KF 5/5, ADF 5/5, APF 5/5 LLE: HF 5/5, KE 5/5, HF, 5/5, ADF 5/5, APF 5/5   Skin: Skin is warm and dry  Assessment/Plan: 1. Functional deficits secondary to SAH/right posterior communicating artery aneurysm which require 3+ hours per day of interdisciplinary therapy in a comprehensive inpatient rehab setting. Physiatrist is providing close team supervision and 24 hour management of  active medical problems listed below. Physiatrist and rehab team continue to assess barriers to discharge/monitor patient progress toward functional and medical goals. FIM: FIM - Bathing Bathing Steps Patient Completed: Right upper leg, Left upper leg Bathing: 2: Max-Patient completes 3-4 26f 10 parts or 25-49%  FIM - Upper Body Dressing/Undressing Upper body dressing/undressing steps patient completed: Thread/unthread right sleeve of pullover shirt/dresss, Thread/unthread left sleeve of pullover shirt/dress, Put head through opening of pull over shirt/dress, Pull shirt over trunk Upper body dressing/undressing: 5: Supervision: Safety issues/verbal cues FIM - Lower Body Dressing/Undressing Lower body dressing/undressing steps patient completed: Thread/unthread right underwear leg, Thread/unthread left underwear leg, Pull underwear up/down, Thread/unthread right pants leg, Thread/unthread left pants leg, Pull pants up/down, Don/Doff right sock, Don/Doff left sock, Don/Doff right shoe, Fasten/unfasten right shoe, Don/Doff left shoe, Fasten/unfasten left shoe Lower body dressing/undressing: 4: Steadying Assist  FIM - Toileting Toileting steps completed by patient: Adjust clothing after toileting, Adjust clothing prior to toileting Toileting Assistive Devices: Grab bar or rail for support Toileting: 3: Mod-Patient completed 2 of 3 steps  FIM - Radio producer Devices: Grab bars Toilet Transfers: 4-From toilet/BSC: Min A (steadying Pt. > 75%)  FIM - Bed/Chair Transfer Bed/Chair Transfer Assistive Devices: Arm rests Bed/Chair Transfer: 4: Bed > Chair or W/C: Min A (steadying Pt. > 75%), 4: Chair or W/C > Bed: Min A (steadying Pt. > 75%), 4: Supine > Sit: Min A (steadying Pt. > 75%/lift 1 leg),  4: Sit > Supine: Min A (steadying pt. > 75%/lift 1 leg)  FIM - Locomotion: Wheelchair Locomotion: Wheelchair: 0: Activity did not occur FIM - Locomotion:  Ambulation Locomotion: Ambulation Assistive Devices: Other (comment) (R HHA) Ambulation/Gait Assistance: 4: Min assist, 4: Min guard Locomotion: Ambulation: 4: Travels 150 ft or more with minimal assistance (Pt.>75%)  Comprehension Comprehension Mode: Auditory Comprehension: 2-Understands basic 25 - 49% of the time/requires cueing 51 - 75% of the time  Expression Expression Mode: Verbal Expression: 1-Expresses basis less than 25% of the time/requires cueing greater than 75% of the time.  Social Interaction Social Interaction: 2-Interacts appropriately 25 - 49% of time - Needs frequent redirection.  Problem Solving Problem Solving: 1-Solves basic less than 25% of the time - needs direction nearly all the time or does not effectively solve problems and may need a restraint for safety  Memory Memory: 1-Recognizes or recalls less than 25% of the time/requires cueing greater than 75% of the time  Medical Problem List and Plan: 1. Functional deficits secondary to right-sided SAH/right posterior communicating artery aneurysm status post coiling 03/05/2014 with communicating hydrocephalus status post VP shunt 03/15/2014  -AMS---likely multifactorial due to hydrocephalus/SAH, poor sleep/wake, meds  - Pseudomonas sensitive to cipro--continue for 7 day course  -trazodone increased to 50mg  hs with good results  -remains quite confused and internally distracted---increase ritalin to 10mg   -will re-check CT of brain tomorrow 2. DVT Prophylaxis/Anticoagulation: SCDs.   3. Pain Management: Tylenol as needed 4. Hyperlipidemia. Zocor 5. Neuropsych: This patient is not capable of making decisions on her own behalf.   -continue ritalin as above to improve day time arousal, focus/attention  -increased trazodone. 6. Skin/Wound Care: Routine skin checks 7. Fluids/Electrolytes/Nutrition: chemistries ok  -encourage adequate po 8. Hypertension Nimotop protocol almost complete  LOS (Days) 10 A FACE TO  FACE EVALUATION WAS PERFORMED  Miasia Crabtree T 03/28/2014 9:12 AM

## 2014-03-29 ENCOUNTER — Inpatient Hospital Stay (HOSPITAL_COMMUNITY): Payer: Medicare Other

## 2014-03-29 ENCOUNTER — Inpatient Hospital Stay (HOSPITAL_COMMUNITY): Payer: Medicare Other | Admitting: Speech Pathology

## 2014-03-29 ENCOUNTER — Encounter (HOSPITAL_COMMUNITY): Payer: PRIVATE HEALTH INSURANCE

## 2014-03-29 NOTE — Progress Notes (Signed)
Speech Language Pathology Daily Session Note  Patient Details  Name: Sharon [name] MRN: [id number] Date of Birth: [birth year]  Today's Date: 03/29/2014 SLP Individual Time: 1300-1400 SLP Individual Time Calculation (min): 60 min  Short Term Goals: Week 2: SLP Short Term Goal 1 (Week 2): Patient will demonstrate sustained attention to a functional task for 1 minute with Max A multimodal cues. SLP Short Term Goal 2 (Week 2): Patient will identify 1 cognitive deficit with Mod A multimodal cues.  SLP Short Term Goal 3 (Week 2): Patient will demonstrate basic problem solving for functional, familiar tasks with Mod A multimodal cues with 50% of opportunities.  SLP Short Term Goal 4 (Week 2): Patient will utilize the call bell to request assistance with Max A multimodal cues with 25% of opportunities SLP Short Term Goal 5 (Week 2): Patient will utilize small bites and a slow rate of self-feeding to minimize aspiration risk with current diet with Mod A multimodal cues.  SLP Short Term Goal 6 (Week 2): Patient will utilize external memory aids to orient to date and situation with Mod A multimodal cues.   Skilled Therapeutic Interventions: Skilled treatment session focused on cognitive goals. Upon arrival, patient was asleep while sitting up in the [place] and required Max verbal and tactile cues for arousal. Patient required total A for orientation to time, place and situation and for focused attention for ~30 seconds throughout the session with a variety of tasks. However, patient demonstrated increased initiation and attention and required Mod A multimodal cues for organizing a flower arrangement. Overall, patient required total A multimodal cues for functional problem solving during basic and familiar tasks and demonstrated limited verbal expression. Patient appeared restless towards end of session and performed remaining cognitive tasks while standing with assistance. Patient left in [place] at [employer]  station. Continue with current plan of care.     FIM:  Comprehension Comprehension Mode: Auditory Comprehension: 2-Understands basic 25 - 49% of the time/requires cueing 51 - 75% of the time Expression Expression Mode: Verbal Expression: 1-Expresses basis less than 25% of the time/requires cueing greater than 75% of the time. Social Interaction Social Interaction: 2-Interacts appropriately 25 - 49% of time - Needs frequent redirection. Problem Solving Problem Solving: 1-Solves basic less than 25% of the time - needs direction nearly all the time or does not effectively solve problems and may need a restraint for safety Memory Memory: 1-Recognizes or recalls less than 25% of the time/requires cueing greater than 75% of the time  Pain Pain Assessment Pain Assessment: No/denies pain  Therapy/Group: Individual Therapy  Jaice Lague 03/29/2014, 3:24 PM

## 2014-03-29 NOTE — Progress Notes (Signed)
[facility] PHYSICAL MEDICINE & REHABILITATION     PROGRESS NOTE    Subjective/Complaints: Had another good night. SO has numerous questions once again about her status and CT which was just performed..  A  review of systems has been performed and if not noted above is otherwise negative.   Objective: Vital Signs: Blood pressure 89/71, pulse 75, temperature 98.1 F (36.7 C), temperature source Oral, resp. rate 17, weight 52.7 kg (116 lb 2.9 oz), SpO2 96 %. Ct Head Wo Contrast  03/29/2014   CLINICAL DATA:  80 year old female confused. History of subarachnoid hemorrhage with aneurysm treated with clipping. Subsequent encounter.  EXAM: CT HEAD WITHOUT CONTRAST  TECHNIQUE: Contiguous axial images were obtained from the base of the skull through the vertex without intravenous contrast.  COMPARISON:  Several prior exams most recent is a head CT from 03/20/2014.  FINDINGS: Ventricular shunt is in place entering from the right posterior temporal -parietal region with the catheter extending through portion of the right lateral ventricle, third ventricle with the tip in the region of the genu of the left internal capsule.  Ventricular size similar to the 03/20/2014 exam, minimally more prominent than the 03/16/2014 exam although significantly improved when compared to 03/10/2014 exam.  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  Previously noted residual tiny amount of blood within the right sylvian fissure has cleared. No new intracranial hemorrhage detected.  Small vessel disease type changes. No CT evidence of large acute thrombotic infarct.  No intracranial mass lesion noted on this unenhanced exam.  IMPRESSION: Ventricular shunt with the tip in the region of the genu of the left internal capsule.  Ventricular size similar to the 03/20/2014 exam, minimally more prominent than the 03/16/2014 exam although significantly improved when compared to 03/10/2014 exam.   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  Previously noted residual tiny amount of blood within the right sylvian fissure has cleared. No new intracranial hemorrhage detected.  Small vessel disease type changes. No CT evidence of large acute thrombotic infarct.   Electronically Signed   By: Chauncey Cruel M.D.   On: 03/29/2014 08:11    Recent Labs  03/28/14 0410  WBC 6.6  HGB 10.7*  HCT 33.3*  PLT 224    Recent Labs  03/28/14 0410  NA 135  K 4.2  CL 99  GLUCOSE 99  BUN 11  CREATININE 0.68  CALCIUM 8.5   CBG (last 3)  No results for input(s): GLUCAP in the last 72 hours.  Wt Readings from Last 3 Encounters:  03/24/14 52.7 kg (116 lb 2.9 oz)  03/18/14 52.4 kg (115 lb 8.3 oz)  10/11/13 57.607 kg (127 lb)    Physical Exam:  Constitutional: She appears well-developed.  HENT:  Head: Normocephalic.  Eyes: EOM are normal.  Neck: Normal range of motion. Neck supple. No thyromegaly present.  Cardiovascular: Normal rate and regular rhythm.  Respiratory: Effort normal and breath sounds normal. No respiratory distress.  GI: Soft. Bowel sounds are normal. She exhibits no distension.  Neurological: She is alert but is very distracted. Verbal. Needs redirection. Non-agitated.    RUE: 5/5 Deltoid, 5/5 Biceps, 5/5 Triceps, 5/5 Wrist Ext, 5/5 Grip LUE: 5/5 Deltoid, 5/5 Biceps, 5/5 Triceps, 5/5 Wrist Ext, 5/5 Grip RLE: HF 5/5, KE 5/5, KF 5/5, ADF 5/5, APF 5/5 LLE: HF 5/5, KE 5/5, HF, 5/5, ADF 5/5, APF 5/5   Skin: Skin is warm and dry  Assessment/Plan: 1. Functional  deficits secondary to SAH/right posterior communicating artery aneurysm which require 3+ hours per day of interdisciplinary therapy in a comprehensive inpatient rehab setting. Physiatrist is providing close team supervision and 24 hour management of active medical problems listed below. Physiatrist and rehab team continue to assess barriers to discharge/monitor patient  progress toward functional and medical goals. FIM: FIM - Bathing Bathing Steps Patient Completed: Right upper leg, Left upper leg Bathing: 2: Max-Patient completes 3-4 33f 10 parts or 25-49%  FIM - Upper Body Dressing/Undressing Upper body dressing/undressing steps patient completed: Thread/unthread right sleeve of pullover shirt/dresss, Thread/unthread left sleeve of pullover shirt/dress, Put head through opening of pull over shirt/dress, Pull shirt over trunk Upper body dressing/undressing: 5: Supervision: Safety issues/verbal cues FIM - Lower Body Dressing/Undressing Lower body dressing/undressing steps patient completed: Thread/unthread right underwear leg, Thread/unthread left underwear leg, Pull underwear up/down, Thread/unthread right pants leg, Thread/unthread left pants leg, Pull pants up/down, Don/Doff right sock, Don/Doff left sock, Don/Doff right shoe, Fasten/unfasten right shoe, Don/Doff left shoe, Fasten/unfasten left shoe Lower body dressing/undressing: 4: Steadying Assist  FIM - Toileting Toileting steps completed by patient: Adjust clothing after toileting Toileting Assistive Devices: Grab bar or rail for support Toileting: 2: Max-Patient completed 1 of 3 steps  FIM - Radio producer Devices: Grab bars Toilet Transfers: 4-From toilet/BSC: Min A (steadying Pt. > 75%)  FIM - Bed/Chair Transfer Bed/Chair Transfer Assistive Devices: Arm rests Bed/Chair Transfer: 4: Sit > Supine: Min A (steadying pt. > 75%/lift 1 leg), 4: Chair or W/C > Bed: Min A (steadying Pt. > 75%), 4: Bed > Chair or W/C: Min A (steadying Pt. > 75%)  FIM - Locomotion: Wheelchair Locomotion: Wheelchair: 0: Activity did not occur FIM - Locomotion: Ambulation Locomotion: Ambulation Assistive Devices: Other (comment) (R HHA) Ambulation/Gait Assistance: 4: Min assist, 4: Min guard Locomotion: Ambulation: 4: Travels 150 ft or more with minimal assistance  (Pt.>75%)  Comprehension Comprehension Mode: Auditory Comprehension: 2-Understands basic 25 - 49% of the time/requires cueing 51 - 75% of the time  Expression Expression Mode: Verbal Expression: 1-Expresses basis less than 25% of the time/requires cueing greater than 75% of the time.  Social Interaction Social Interaction: 2-Interacts appropriately 25 - 49% of time - Needs frequent redirection.  Problem Solving Problem Solving: 1-Solves basic less than 25% of the time - needs direction nearly all the time or does not effectively solve problems and may need a restraint for safety  Memory Memory: 1-Recognizes or recalls less than 25% of the time/requires cueing greater than 75% of the time  Medical Problem List and Plan: 1. Functional deficits secondary to right-sided SAH/right posterior communicating artery aneurysm status post coiling 03/05/2014 with communicating hydrocephalus status post VP shunt 03/15/2014  -AMS---likely multifactorial due to hydrocephalus/SAH, poor sleep/wake, meds  - Pseudomonas sensitive to cipro--continue for 7 day course  -trazodone increased to 50mg  hs with good results  -remains quite confused and internally distracted---increase ritalin to 10mg   -CT of head today reveals stable edema/ventricles and resolution of blood 2. DVT Prophylaxis/Anticoagulation: SCDs.   3. Pain Management: Tylenol as needed 4. Hyperlipidemia. Zocor 5. Neuropsych: This patient is not capable of making decisions on her own behalf.   -continue ritalin as above to improve day time arousal, focus/attention  -increased trazodone. 6. Skin/Wound Care: Routine skin checks 7. Fluids/Electrolytes/Nutrition: chemistries ok  -encourage adequate po 8. Vasospasm---nimotop completed LOS (Days) 11 A FACE TO FACE EVALUATION WAS PERFORMED  Cherre Kothari T 03/29/2014 8:29 AM

## 2014-03-29 NOTE — Progress Notes (Signed)
Physical Therapy Session Note  Patient Details  Name: Sharon [name] MRN: [id number] Date of Birth: Oct 18, 1934  Today's Date: 03/29/2014 PT Individual Time: 1100-1200 PT Individual Time Calculation (min): 60 min   Short Term Goals: Week 2:  PT Short Term Goal 1 (Week 2): Pt to perform functional transfers with close(S), 50% of time PT Short Term Goal 2 (Week 2): Pt will ambulate 200' with close(S), 50% of time PT Short Term Goal 3 (Week 2): Pt to maintain dynamic standing balance during functional tasks with close(S), 50% of time PT Short Term Goal 4 (Week 2): Pt will demonstrate sustained attention to functional task up to 42min with max cues PT Short Term Goal 5 (Week 2): Pt will attend to objects on L side during funcitonal tasks with mod cues  Skilled Therapeutic Interventions/Progress Updates:  1:1. Pt received sitting in geri-chair, awake with significant other at side. Focus this session on orientation, focused attention, initiation, basic problem solving, L awareness, sequencing, following 1 step commands and safety during overall functional transfers and mobility. Overall, pt req close(S)-min A for all functional transfers (furniture, toilet and car) and ambulation 200'x4 with intermittent HHA. Pt continues to demonstrate antalgic gait pattern during L stance, noted increased L lean today vs. Previously.   Pt req total cues for Orientation x4 despite use of external cues, initiation of all verbal and motor tasks, seq of toileting and washing hands at sink. Pt only req mod cues for safe completion of car transfer.   Attempted to engage pt in standing ball toss counting up to 5 reps, however, pt unable to demonstrate focused attention to task as well as req consistent cues to catch ball vs. Bounce back. Also attempted to engage pt in basic game of "Connect 4" to address L awareness, problem solving and attention. Task too difficult, downgraded to having pt just fill entire board with  pieces regardless of color, req total cues for focused attention to task and awareness of L side of entire board. Pt consistently going to R side then working her way L.   Pt left sitting in geri-chair at nurses station at end of session with quick release belt in place.   Therapy Documentation Precautions:  Precautions Precautions: Fall Precaution Comments: perseveration Restrictions Weight Bearing Restrictions: No  See FIM for current functional status  Therapy/Group: Individual Therapy  Ximenna, Fonseca 03/29/2014, 12:04 PM

## 2014-03-29 NOTE — Progress Notes (Signed)
Occupational Therapy Session Note  Patient Details  Name: Sharon [name] MRN: [id number] Date of Birth: 1934/10/02  Today's Date: 03/29/2014 OT Individual Time: 0900-1000 OT Individual Time Calculation (min): 60 min    Short Term Goals: Week 2:  OT Short Term Goal 1 (Week 2): Pt will gather clothing in preparation for BADLs with steady A OT Short Term Goal 2 (Week 2): Pt will demonstrate sustained attention for 10 mins during bathing session OT Short Term Goal 3 (Week 2): Pt will demonstrate intellectual awareness of deficitis with min verbal cues OT Short Term Goal 4 (Week 2): Pt will initiate bathing tasks with min multimodal cues  Skilled Therapeutic Interventions/Progress Updates:    Pt resting in recliner upon arrival.  Pt greeted this therapist appropriately but required tot A for orientation to place, situation, or date.  Pt engaged in BADL retraining including bathing at shower level and dressing with sit<>stand from chair.  Pt requires max multimodal cues for task initiation, sequencing, and task termination with all bathing tasks.  Pt initiates all clothing tasks when presented with clothing items.  Pt required max multimodal cues for redirection to all tasks this morning and had difficulty keeping eyes open when not engaged in functional task.  Focus on arousal, attention to task, task initiation, sequencing, orientation, standing balance, functional amb with min HHA, and safety awareness.  Therapy Documentation Precautions:  Precautions Precautions: Fall Precaution Comments: perseveration Restrictions Weight Bearing Restrictions: No  Pain: Pain Assessment Pain Assessment: No/denies pain  See FIM for current functional status  Therapy/Group: Individual Therapy  Leroy Libman 03/29/2014, 12:09 PM

## 2014-03-29 NOTE — Progress Notes (Signed)
NUTRITION FOLLOW UP  INTERVENTION: Continue Ensure Complete po BID, each supplement provides 350 kcal and 13 grams of protein.  Continue Magic cup BID with meals, each supplement provides 290 kcal and 9 grams of protein.  Encourage adequate PO intake.  NUTRITION DIAGNOSIS: Inadequate oral intake related to confusion, needing assistance at meals as evidenced by varied meal completion of 40-100%; ongoing  Goal: Pt to meet >/= 90% of their estimated nutrition needs; met  Monitor:  PO intake, weight trends, labs, I/O's  80 y.o. female  Admitting Dx: Hydrocephalus  ASSESSMENT: Pt with history of hyperlipidemia. Admitted 03/02/2014 with altered mental status. Lumbar puncture completed showing significant subarachnoid blood mixed with xanthochromic fluid. Interventional radiology consulted with cerebral arteriogram completed and underwent coiling of aneurysm 03/05/2014.   Pt reports having a good appetite. Meal completion has been varied from 50-100%. Pt has been drinking her Ensure Shakes. Will continue with current orders. Pt was encouraged to eat her food at meals and to drink her supplements.  Labs and medications reviewed.  Height: Ht Readings from Last 1 Encounters:  03/18/14 5' 3"  (1.6 m)    Weight: Wt Readings from Last 1 Encounters:  03/24/14 116 lb 2.9 oz (52.7 kg)    BMI:  Body mass index is 20.59 kg/(m^2).  Re-Estimated Nutritional Needs: Kcal: 1700-1900 Protein: 65-75 grams Fluid: 1.7 - 1.9 L/day  Skin: incision on right head and abdomen  Diet Order: DIET DYS 3   Intake/Output Summary (Last 24 hours) at 03/29/14 1351 Last data filed at 03/29/14 1224  Gross per 24 hour  Intake    960 ml  Output      0 ml  Net    960 ml    Last BM: 1/24  Labs:   Recent Labs Lab 03/28/14 0410  NA 135  K 4.2  CL 99  CO2 28  BUN 11  CREATININE 0.68  CALCIUM 8.5  GLUCOSE 99    CBG (last 3)  No results for input(s): GLUCAP in the last 72 hours.  Scheduled  Meds: . ciprofloxacin  250 mg Oral BID  . famotidine  20 mg Oral Daily  . feeding supplement (ENSURE COMPLETE)  237 mL Oral BID BM  . methylphenidate  10 mg Oral BID WC  . simvastatin  20 mg Oral q1800  . sodium chloride  10-40 mL Intracatheter Q12H  . traZODone  50 mg Oral QHS    Continuous Infusions:   Past Medical History  Diagnosis Date  . Dyslipidemia   . Cystitis 03/02/2014    acute   . UTI (urinary tract infection) 03/02/2014  . Arthritis     osteo    Past Surgical History  Procedure Laterality Date  . Abdominal hysterectomy    . Radiology with anesthesia N/A 03/04/2014    Procedure: RADIOLOGY WITH ANESTHESIA;  Surgeon: Medication Radiologist, MD;  Location: [facility];  Service: Radiology;  Laterality: N/A;  . Radiology with anesthesia N/A 03/04/2014    Procedure: RADIOLOGY WITH ANESTHESIA;  Surgeon: Rob Hickman, MD;  Location: [facility];  Service: Radiology;  Laterality: N/A;  . Ventriculoperitoneal shunt Right 03/15/2014    Procedure: SHUNT INSERTION VENTRICULAR-PERITONEAL;  Surgeon: Charlie Pitter, MD;  Location: [facility];  Service: Neurosurgery;  Laterality: Right;    Kallie Locks, MS, RD, LDN Pager # [phone] After hours/ weekend pager # [phone]

## 2014-03-29 NOTE — Progress Notes (Signed)
Social Work Patient ID: Sharon [name], female   DOB: 1934/05/30, 80 y.o.   MRN: [id number]  Spoke with pt's daughter, Sharon [name], on Sat 1/23 and again today to discuss her current functional status and discharge planning options.  Have explained to daughter that I have discussed pt's cognitive and functional status with therapies, MD and neuropsychology and all reported very little improvement in cognition overall.  Still only oriented to self.  Physically requiring CGA/ min assist and constant cueing due to extremely poor attention.  Given this level of functioning, she is not at a level that she could succeed at an Assisted Living Level of care.  Have spoken with [facility] social worker, Katy Fitch, and confirmed that this is the only level that has any openings at this time.  (Pt had been on the "list" for Independent Living at [facility] for some time but had not made any decisions about moving in there yet at the time of her illness.)  Discussed option of pt d/c home with 24/7 private duty caregiver, however, daughter does not feel this would be in pt's best interest at this time.  Discussed option of SNF after CIR to extend period of daily therapies in hopes that pt might reach a level of function that she could be managed at ALF level or [facility] might have an opening in their "memory care" division.  Daughter feels that this would be the best plan at this point.  She states that her first choice "based on what all of her friends are saying" is [facility].  Explained that I will need to have some back - up options in place as well and daughter agreeable with pursuing several facilities in [place].  Will discuss further with team at conference tomorrow.  Kortne All, LCSW

## 2014-03-30 ENCOUNTER — Inpatient Hospital Stay (HOSPITAL_COMMUNITY): Payer: Medicare Other | Admitting: Speech Pathology

## 2014-03-30 ENCOUNTER — Encounter (HOSPITAL_COMMUNITY): Payer: PRIVATE HEALTH INSURANCE

## 2014-03-30 ENCOUNTER — Inpatient Hospital Stay (HOSPITAL_COMMUNITY): Payer: Medicare Other

## 2014-03-30 NOTE — Plan of Care (Signed)
Problem: RH Balance Goal: LTG Patient will maintain dynamic sitting balance (PT) LTG: Patient will maintain dynamic sitting balance with assistance during mobility activities (PT)  Downgraded due to decreased alertness/arousal at times and impaired overall cognition.  Goal: LTG Patient will maintain dynamic standing balance (PT) LTG: Patient will maintain dynamic standing balance with assistance during mobility activities (PT)  Downgraded due to decreased balance and impaired cognition overall.   Problem: RH Bed to Chair Transfers Goal: LTG Patient will perform bed/chair transfers w/assist (PT) LTG: Patient will perform bed/chair transfers with assistance, with/without cues (PT).  Downgraded due to decreased balance and impaired cognition overall.   Problem: RH Car Transfers Goal: LTG Patient will perform car transfers with assist (PT) LTG: Patient will perform car transfers with assistance (PT).  Downgraded due to decreased balance and impaired cognition overall.   Problem: RH Furniture Transfers Goal: LTG Patient will perform furniture transfers w/assist (OT/PT LTG: Patient will perform furniture transfers with assistance (OT/PT).  Downgraded due to decreased balance and impaired cognition overall.   Problem: RH Floor Transfers Goal: LTG Patient will perform floor transfers w/assist (PT) LTG: Patient will perform floor transfers with assistance (PT).  Downgraded due to decreased balance, strength and impaired cognition overall.   Problem: RH Ambulation Goal: LTG Patient will ambulate in controlled environment (PT) LTG: Patient will ambulate in a controlled environment, # of feet with assistance (PT).  Downgraded due to decreased balance and impaired cognition overall.  Goal: LTG Patient will ambulate in home environment (PT) LTG: Patient will ambulate in home environment, # of feet with assistance (PT).  Downgraded due to decreased balance and impaired cognition overall.  Goal:  LTG Patient will ambulate in community environment (PT) LTG: Patient will ambulate in community environment, # of feet with assistance (PT).  Downgraded due to decreased balance and impaired cognition overall.   Problem: RH Stairs Goal: LTG Patient will ambulate up and down stairs w/assist (PT) LTG: Patient will ambulate up and down # of stairs with assistance (PT)  Downgraded due to decreased balance and impaired cognition overall.

## 2014-03-30 NOTE — Progress Notes (Signed)
Social Work Patient ID: Darrick Grinder, female   DOB: 01-10-1935, 80 y.o.   MRN: [id number]   Lennart Pall, LCSW Social Worker Signed  Patient Care Conference 03/30/2014  4:10 PM    Expand All Collapse All   Inpatient RehabilitationTeam Conference and Plan of Care Update Date: 03/30/2014   Time: 2:55 PM     Patient Name: Sharon [name]       Medical Record Number: [id number]  Date of Birth: 08/02/1934 Sex: Female         Room/Bed: 4W17C/4W17C-01 Payor Info: Payor: MEDICARE / Plan: MEDICARE PART A AND B / Product Type: *No Product type* /    Admitting Diagnosis: R SAH  VP Shunt   Admit Date/Time:  03/18/2014  2:28 PM Admission Comments: No comment available   Primary Diagnosis:  Nontraumatic subarachnoid hemorrhage Principal Problem: Nontraumatic subarachnoid hemorrhage    Patient Active Problem List     Diagnosis  Date Noted   .  Hydrocephalus  03/18/2014   .  Delirium     .  Posterior communicating artery aneurysm     .  Communicating hydrocephalus     .  Urinary tract infection, acute     .  HLD (hyperlipidemia)     .  Altered mental status     .  Nontraumatic subarachnoid hemorrhage  03/06/2014   .  Acute respiratory failure     .  SAH (subarachnoid hemorrhage)     .  Aneurysm     .  Meningitis     .  Acute encephalopathy  03/02/2014   .  Headache  03/02/2014   .  UTI (lower urinary tract infection)  03/02/2014   .  Dyslipidemia  03/02/2014     Expected Discharge Date: Expected Discharge Date:  (SNF)  Team Members Present: Physician leading conference: Dr. Alger Simons Social Worker Present: Lennart Pall, LCSW Nurse Present: Heather Roberts, RN PT Present: Melene Plan, Cottie Banda, PT OT Present: Roanna Epley, COTA;Jennifer Jolene Schimke, OT SLP Present: Weston Anna, SLP PPS Coordinator present : Daiva Nakayama, RN, CRRN        Current Status/Progress  Goal  Weekly Team Focus   Medical     slow progress. hydrocephalus, rxing UTI   working on sleep  wake, attention improvement  see above   Bowel/Bladder     pt can be incont at times- urgancy with b/b. Brief used. Timed toileting iniated.  to manage b/b min assist.  cont. timed tolieting with b/b. encourage pt to clean self correctly.   Swallow/Nutrition/ Hydration     Dys. 3 textures with thin liquids, Max A for use of swallow strategies   Min A   increased use of swallow strategies     ADL's     bathing-min A; dressing-supervision; functional transfers-min A; orientation-max/tot A; max A for attention   supervision overall  cognitive remediation; functional transfers, attention, task initiation   Mobility     Supervision-min A for bed mobility and sitting balance; Consistent Min A for all transfers, standing balance and ambulation with HHA. Max-total cues for overall cognition.   LTGs downgraded to min A overall at standing level due to decreased balance, endurance and impaired cognition overall   activity tolerance, cognitive remediation, focused>susatined attention, sequencing, initiaiton, memory awarenes, safety during functional transfers and mobility, balance and strength    Communication               Safety/Cognition/ Behavioral Observations    Max-Total  A   Max A   attention, initation, following commands    Pain     family states she complains of headaches at times. does not complain to staff  less than or equal to 3  monitor nonverbal cues for pain. medicate as needed    Skin     incision to abdomen and scalp CDI-staples removed. peri area reddned with mgp applied and epbc to sacrum  No additional skin breakdown with min assist   cont. to use mgp and epbc. assess skin q shift     Rehab Goals Patient on target to meet rehab goals: No *See Care Plan and progress notes for long and short-term goals.    Barriers to Discharge:  slow progress, see prior     Possible Resolutions to Barriers:   goals downgraded. ongoing supervision and care upon dc     Discharge  Planning/Teaching Needs:   Since prior conference, d/c plan has changed to SNF as pt making little progress and family cannot meet care needs at home       Team Discussion:    Very little change in cognitive impairments this week.  MD feels will require much longer recovery time than initially anticipated.  D3 and thin primarily due to such poor attention.  All goals downgraded to minimal assistance.   Revisions to Treatment Plan:    All goals downgraded to min assist.  Change in d/c plan to SNF.    Continued Need for Acute Rehabilitation Level of Care: The patient requires daily medical management by a physician with specialized training in physical medicine and rehabilitation for the following conditions: Daily direction of a multidisciplinary physical rehabilitation program to ensure safe treatment while eliciting the highest outcome that is of practical value to the patient.: Yes Daily medical management of patient stability for increased activity during participation in an intensive rehabilitation regime.: Yes Daily analysis of laboratory values and/or radiology reports with any subsequent need for medication adjustment of medical intervention for : Neurological problems;Pulmonary problems;Post surgical problems  Sharon [name] 03/30/2014, 4:10 PM                 Lennart Pall, LCSW Social Worker Signed  Patient Care Conference 03/24/2014  6:47 AM    Expand All Collapse All   Inpatient RehabilitationTeam Conference and Plan of Care Update Date: 03/23/2014   Time: 2:55 PM     Patient Name: Sharon [name] Record Number: [id number]  Date of Birth: 06/16/1934 Sex: Female         Room/Bed: 4W17C/4W17C-01 Payor Info: Payor: MEDICARE / Plan: MEDICARE PART A AND B / Product Type: *No Product type* /    Admitting Diagnosis: R SAH  VP Shunt   Admit Date/Time:  03/18/2014  2:28 PM Admission Comments: No comment available   Primary Diagnosis:  <principal problem not  specified> Principal Problem: <principal problem not specified>    Patient Active Problem List     Diagnosis  Date Noted   .  Hydrocephalus  03/18/2014   .  Delirium     .  Posterior communicating artery aneurysm     .  Communicating hydrocephalus     .  Urinary tract infection, acute     .  HLD (hyperlipidemia)     .  Altered mental status     .  Subarachnoid hemorrhage  03/06/2014   .  Acute respiratory failure     .  SAH (subarachnoid  hemorrhage)     .  Aneurysm     .  Meningitis     .  Acute encephalopathy  03/02/2014   .  Headache  03/02/2014   .  UTI (lower urinary tract infection)  03/02/2014   .  Dyslipidemia  03/02/2014     Expected Discharge Date: Expected Discharge Date: 04/06/14  Team Members Present: Physician leading conference: Dr. Alger Simons Social Worker Present: Lennart Pall, LCSW Nurse Present: Elliot Cousin, RN PT Present: Melene Plan, PT OT Present: Roanna Epley, Griffin Basil, OT SLP Present: Other (comment) Rulon Eisenmenger, SLP) Other (Discipline and Name): Danne Baxter, RN [facility]) PPS Coordinator present : Daiva Nakayama, RN, CRRN        Current Status/Progress  Goal  Weekly Team Focus   Medical     sah with aneurysm, hydrocephalus, uti   improve mentation to allow better particaiption  uti rx, sleep-restoration   Bowel/Bladder     Continent of bowel and bladder at times. LBM 03/22/14   Pt to remain continent of bowel and bladder.   Offer toileting assistance q 2-3hours    Swallow/Nutrition/ Hydration     Min with Dys 3 and thin  supervision  increase use of strategies, advanced trials    ADL's     bathing/dressing-min A; functional transfers-min A; max verbal cues for cognition/orientation; max A/tot A for attention   supervision overall  cognitive remediation; functional transfers, attention, task initiation   Mobility     Supervision-min A for bed mobility; min-mod A for functional transfers, stair negotiation, balance and ambulation  with HHA; Max overall cues for cognition  Supervision overall   activity tolerance, cognitive remediation, safety during funcitonal transfers and mobility, balance, strength    Communication               Safety/Cognition/ Behavioral Observations    Mod-Max  Min  basic problem solving, sustained attention, awareness, recall    Pain     No c/o pain  <3  Monitor for nonverbal cues of pain    Skin     Incision to mid-posterior and mid abd with staples approximated, OTA, unremarkable  No additional skin breakdown  Encourage turn q 2hrs    Rehab Goals Patient on target to meet rehab goals: Yes *See Care Plan and progress notes for long and short-term goals.    Barriers to Discharge:  poor cognition, uti     Possible Resolutions to Barriers:   family ed, sleep restoration, rx uti      Discharge Planning/Teaching Needs:   home wtih family to arrange 24/7 care vs SNF       Team Discussion:    Poor sleep/ wake cycle and now with UTI - both affecting behavior and cognition.  Still with significant confusion.  Goals of supervision - SW to follow up further with family about their d/c plan.  Not yet ready for neuropsych.   Revisions to Treatment Plan:    None    Continued Need for Acute Rehabilitation Level of Care: The patient requires daily medical management by a physician with specialized training in physical medicine and rehabilitation for the following conditions: Daily direction of a multidisciplinary physical rehabilitation program to ensure safe treatment while eliciting the highest outcome that is of practical value to the patient.: Yes Daily medical management of patient stability for increased activity during participation in an intensive rehabilitation regime.: Yes Daily analysis of laboratory values and/or radiology reports with any subsequent need for medication adjustment  of medical intervention for : Neurological problems;Post surgical problems  Sharon [name]  03/24/2014, 6:47  AM

## 2014-03-30 NOTE — Plan of Care (Signed)
Problem: RH Ambulation Goal: LTG Patient will ambulate in community environment (PT) LTG: Patient will ambulate in community environment, # of feet with assistance (PT).  Outcome: Not Applicable Date Met:  18/86/77 Goal D/C. Not appropriate at this time due to impaired cognition overall.

## 2014-03-30 NOTE — Progress Notes (Signed)
Occupational Therapy Session Note  Patient Details  Name: NANDI TONNESEN MRN: [id number] Date of Birth: [birth year]  Today's Date: 03/30/2014 OT Individual Time: 0900-1000 OT Individual Time Calculation (min): 60 min    Short Term Goals: Week 2:  OT Short Term Goal 1 (Week 2): Pt will gather clothing in preparation for BADLs with steady A OT Short Term Goal 2 (Week 2): Pt will demonstrate sustained attention for 10 mins during bathing session OT Short Term Goal 3 (Week 2): Pt will demonstrate intellectual awareness of deficitis with min verbal cues OT Short Term Goal 4 (Week 2): Pt will initiate bathing tasks with min multimodal cues  Skilled Therapeutic Interventions/Progress Updates:    Pt eating breakfast upon arrival.  Pt required tot A for orientation to place and date.  Pt was able to state that she had "a bleed."  Pt performed dressing tasks with steady A when presented with clothing items.  Pt completed grooming tasks standing at sink with steady A.  Attempted to engage patient in therapeutic activities but patient unable to attend to tasks.  Pt required tot A for task initiation and attention to task.  Pt requires steady A for standing balance and functional amb with HHA.  Therapy Documentation Precautions:  Precautions Precautions: Fall Precaution Comments: perseveration Restrictions Weight Bearing Restrictions: No   Pain: Pain Assessment Pain Assessment: No/denies pain  See FIM for current functional status  Therapy/Group: Individual Therapy  Leroy Libman 03/30/2014, 10:02 AM

## 2014-03-30 NOTE — Progress Notes (Signed)
Speech Language Pathology Daily Session Note  Patient Details  Name: Sharon [name] MRN: [id number] Date of Birth: 01/07/1935  Today's Date: 03/30/2014 SLP Individual Time: 1500-1540 SLP Individual Time Calculation (min): 40 min  Short Term Goals: Week 2: SLP Short Term Goal 1 (Week 2): Patient will demonstrate sustained attention to a functional task for 1 minute with Max A multimodal cues. SLP Short Term Goal 2 (Week 2): Patient will identify 1 cognitive deficit with Mod A multimodal cues.  SLP Short Term Goal 3 (Week 2): Patient will demonstrate basic problem solving for functional, familiar tasks with Mod A multimodal cues with 50% of opportunities.  SLP Short Term Goal 4 (Week 2): Patient will utilize the call bell to request assistance with Max A multimodal cues with 25% of opportunities SLP Short Term Goal 5 (Week 2): Patient will utilize small bites and a slow rate of self-feeding to minimize aspiration risk with current diet with Mod A multimodal cues.  SLP Short Term Goal 6 (Week 2): Patient will utilize external memory aids to orient to date and situation with Mod A multimodal cues.   Skilled Therapeutic Interventions: Skilled treatment session focused on addressing cognitive goals. Patient was sitting up in the [place] at [place].  Patient required Total assist for orientation to time, place and situation.  SLP facilitated session with Max assist multimodal cues for brief sustained attention to 1-step directions during a flower arranging task. Patient was able to verbalize need to use the bathroom but required Total assist multimodal cues for functional problem solving during basic, familiar self-care task.  Patient then walked back [place] and due to fatigue was unable to finish session; as a result, patient missed 20 minutes of therapy.  Patient left in [place] at [employer] station. Continue with current plan of care.     FIM:  Comprehension Comprehension Mode:  Auditory Comprehension: 2-Understands basic 25 - 49% of the time/requires cueing 51 - 75% of the time Expression Expression Mode: Verbal Expression: 1-Expresses basis less than 25% of the time/requires cueing greater than 75% of the time. Social Interaction Social Interaction: 2-Interacts appropriately 25 - 49% of time - Needs frequent redirection. Problem Solving Problem Solving: 1-Solves basic less than 25% of the time - needs direction nearly all the time or does not effectively solve problems and may need a restraint for safety Memory Memory: 1-Recognizes or recalls less than 25% of the time/requires cueing greater than 75% of the time  Pain Pain Assessment Pain Assessment: No/denies pain  Therapy/Group: Individual Therapy  Carmelia Roller., CCC-SLP [phone]  [place] 03/30/2014, 3:42 PM

## 2014-03-30 NOTE — Consult Note (Signed)
NEUROBEHAVIORAL STATUS EXAM - [facility]   MEDICAL NECESSITY:  Sharon [name] was seen on the [place] [facility] for a neurobehavioral status exam owing to the patient's diagnosis of non-traumatic subarachnoid hemorrhage, and to assist in treatment planning during admission.   According to medical records, Sharon [name] was admitted to the rehab unit owing to "Functional deficits secondary to right-sided SAH/right posterior communicating artery aneurysm status post coiling 03/05/2014 with communicating hydrocephalus status post VP shunt 03/15/2014."   Records also indicated that Sharon [name] is "a 80 y.o. right handed female with history of hyperlipidemia. Admitted 03/02/2014 with altered mental status. Patient independent prior to admission living with her significant other. By report patient had been experienced headache with weakness while at a recent dinner party. Initial cranial CT scan negative. MRI showed focal diffusion signal abnormality within the right sylvian fissure. 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 Lumbar puncture completed showing significant subarachnoid blood mixed with xanthochromic fluid. Interventional radiology consulted with cerebral arteriogram completed and underwent coiling of aneurysm 03/05/2014. Patient maintained on mechanical ventilation. Follow-up cranial CT scan showed no visible infarct or acute hemorrhage after coiling.Follow-up serial cranial CT scans showed increasing communicating hydrocephalus with patient having episodes of increasing confusion and incontinence. Follow-up neurosurgery underwent right occipital VP shunt 03/15/2014."   Updated head CT scan from 03/29/2014 reportedly revealed ventricular shunt with the tip in the region of the genu of the left internal capsule. Ventricular size was reportedly similar to the 03/20/2014  exam, minimally more prominent than the 03/16/2014 exam although significantly improved when compared to 03/10/2014 exam. Previously noted with residual tiny amount of blood within the right sylvian fissure has reportedly cleared. No new intracranial hemorrhage reportedly detected.   During today's visit, Sharon [name] was not a good historian given the nature and severity of her cognitive impairment. She did not realize that she was in the hospital and she could not communicate why she was admitted. She denied suffering any cognitive difficulties. She endorsed mild depressive feelings but she could not express why. Suicidal/homicidal ideation, plan or intent was denied. No manic or hypomanic episodes were reported. The patient denied ever experiencing any auditory/visual hallucinations.   Sharon [name] significant other was present for a portion of the clinical interview. The patient did not recognize him. She was unable to name any family members when she was asked about her support system.   PROCEDURES ADMINISTERED: [2 units 96116] Diagnostic clinical interview  Review of available records Montreal Cognitive Assessment (MoCA)  MENTAL STATUS: The mental status exam was unable to be completed given then nature and severity of the patient's cognitive impairment. She was confused and did not understand any test directions.    Behavioral Evaluation: Sharon [name] was appropriately dressed for season and situation, and she appeared tidy and well-groomed. Normal posture was noted. She was friendly. Her speech was as expected but she had trouble expressing herself verbally. She had difficulty understanding test directions. Her affect was blunted. Attention was poor. Optimal test taking conditions were maintained.   IMPRESSION: Sharon [name] appears to be experiencing a great deal of cognitive impairment; at the level of dementia. An extensive period of time was spent with the patient's significant other explaining  test results and asking that he try and be patient with the rehab staff as he has been known to become quite irate when information is not immediately provided to him. He said he would  try. Neuropsychology will follow-up with the patient during this admission to monitor for interval change and to provide education to the family as needed.    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  Clinical Neuropsychologist

## 2014-03-30 NOTE — Progress Notes (Signed)
Physical Therapy Session Note  Patient Details  Name: Sharon [name] MRN: [id number] Date of Birth: 1934-08-18  Today's Date: 03/30/2014 PT Individual Time: 0800-0900 PT Individual Time Calculation (min): 60 min   Short Term Goals: Week 2:  PT Short Term Goal 1 (Week 2): Pt to perform functional transfers with close(S), 50% of time PT Short Term Goal 2 (Week 2): Pt will ambulate 200' with close(S), 50% of time PT Short Term Goal 3 (Week 2): Pt to maintain dynamic standing balance during functional tasks with close(S), 50% of time PT Short Term Goal 4 (Week 2): Pt will demonstrate sustained attention to functional task up to 91min with max cues PT Short Term Goal 5 (Week 2): Pt will attend to objects on L side during funcitonal tasks with mod cues  Skilled Therapeutic Interventions/Progress Updates:  1:1. Pt received semi-reclined in bed asleep, req total multimodal cues x16minutes and therapist providing total A to t/f pt sup>sit EOB to wake and demonstrate EO. Pt demonstrating slow, but progressively increased alertness/arousal throughout tx session. Focus this session on alertness/arousal, orientation, focused>sustained attention, sequencing, initiation, L awareness, safety during functional transfers and ambulation. Pt req overall min HHA for all transfers, toileting and ambulation 200-250'x4 due to ongoing L lean and decreased balance overall. Pt req total cues for orientation x4 as well as max-total cues overall for focused attention, seq and initiation of basic functional tasks this session including toileting, light grooming at sink, opening doors, engaging in conversation and attempting to make bed in therapy apartment.   Pt demonstrating improved sustained attention up to inconsistent 56min intervals to collecting flowers on L side of table and arranging them in vases as well as to eating breakfast with overall mod-max cues. During eating, pt cued per SLP recommendation sheet.   Pt left  sitting in day room eating breakfast in care of OT.   Therapy Documentation Precautions:  Precautions Precautions: Fall Precaution Comments: perseveration Restrictions Weight Bearing Restrictions: No Pain: Pain Assessment Pain Assessment: No/denies pain  See FIM for current functional status  Therapy/Group: Individual Therapy  Nallely, Yost 03/30/2014, 9:07 AM

## 2014-03-30 NOTE — Patient Care Conference (Signed)
Inpatient RehabilitationTeam Conference and Plan of Care Update Date: 03/30/2014   Time: 2:55 PM    Patient Name: Sharon [name]      Medical Record Number: [id number]  Date of Birth: [birth year] Sex: Female         Room/Bed: 4W17C/4W17C-01 Payor Info: Payor: MEDICARE / Plan: MEDICARE PART A AND B / Product Type: *No Product type* /    Admitting Diagnosis: R SAH  VP Shunt   Admit Date/Time:  03/18/2014  2:28 PM Admission Comments: No comment available   Primary Diagnosis:  Nontraumatic subarachnoid hemorrhage Principal Problem: Nontraumatic subarachnoid hemorrhage  Patient Active Problem List   Diagnosis Date Noted  . Hydrocephalus 03/18/2014  . Delirium   . Posterior communicating artery aneurysm   . Communicating hydrocephalus   . Urinary tract infection, acute   . HLD (hyperlipidemia)   . Altered mental status   . Nontraumatic subarachnoid hemorrhage 03/06/2014  . Acute respiratory failure   . SAH (subarachnoid hemorrhage)   . Aneurysm   . Meningitis   . Acute encephalopathy 03/02/2014  . Headache 03/02/2014  . UTI (lower urinary tract infection) 03/02/2014  . Dyslipidemia 03/02/2014    Expected Discharge Date: Expected Discharge Date:  (SNF)  Team Members Present: Physician leading conference: Dr. Alger Simons Social Worker Present: Lennart Pall, LCSW Nurse Present: Heather Roberts, RN PT Present: Melene Plan, Cottie Banda, PT OT Present: Roanna Epley, COTA;Jennifer Jolene Schimke, OT SLP Present: Weston Anna, SLP PPS Coordinator present : Daiva Nakayama, RN, CRRN     Current Status/Progress Goal Weekly Team Focus  Medical   slow progress. hydrocephalus, rxing UTI  working on sleep wake, attention improvement  see above   Bowel/Bladder   pt can be incont at times- urgancy with b/b. Brief used. Timed toileting iniated.  to manage b/b min assist.  cont. timed tolieting with b/b. encourage pt to clean self correctly.   Swallow/Nutrition/ Hydration   Dys. 3 textures with thin liquids, Max A for use of swallow strategies   Min A   increased use of swallow strategies    ADL's   bathing-min A; dressing-supervision; functional transfers-min A; orientation-max/tot A; max A for attention  supervision overall  cognitive remediation; functional transfers, attention, task initiation   Mobility   Supervision-min A for bed mobility and sitting balance; Consistent Min A for all transfers, standing balance and ambulation with HHA. Max-total cues for overall cognition.   LTGs downgraded to min A overall at standing level due to decreased balance, endurance and impaired cognition overall  activity tolerance, cognitive remediation, focused>susatined attention, sequencing, initiaiton, memory awarenes, safety during functional transfers and mobility, balance and strength   Communication             Safety/Cognition/ Behavioral Observations  Max-Total A   Max A   attention, initation, following commands    Pain   family states she complains of headaches at times. does not complain to staff  less than or equal to 3  monitor nonverbal cues for pain. medicate as needed   Skin   incision to abdomen and scalp CDI-staples removed. peri area reddned with mgp applied and epbc to sacrum  No additional skin breakdown with min assist  cont. to use mgp and epbc. assess skin q shift    Rehab Goals Patient on target to meet rehab goals: No *See Care Plan and progress notes for long and short-term goals.  Barriers to Discharge: slow progress, see prior    Possible Resolutions to  Barriers:  goals downgraded. ongoing supervision and care upon dc    Discharge Planning/Teaching Needs:  Since prior conference, d/c plan has changed to SNF as pt making little progress and family cannot meet care needs at home      Team Discussion:  Very little change in cognitive impairments this week.  MD feels will require much longer recovery time than initially anticipated.  D3 and  thin primarily due to such poor attention.  All goals downgraded to minimal assistance.  Revisions to Treatment Plan:  All goals downgraded to min assist.  Change in d/c plan to SNF.   Continued Need for Acute Rehabilitation Level of Care: The patient requires daily medical management by a physician with specialized training in physical medicine and rehabilitation for the following conditions: Daily direction of a multidisciplinary physical rehabilitation program to ensure safe treatment while eliciting the highest outcome that is of practical value to the patient.: Yes Daily medical management of patient stability for increased activity during participation in an intensive rehabilitation regime.: Yes Daily analysis of laboratory values and/or radiology reports with any subsequent need for medication adjustment of medical intervention for : Neurological problems;Pulmonary problems;Post surgical problems  Phares Zaccone 03/30/2014, 4:10 PM

## 2014-03-30 NOTE — Progress Notes (Signed)
[place] PHYSICAL MEDICINE & REHABILITATION     PROGRESS NOTE    Subjective/Complaints: Slept well again per staff. Remains confused and distracted.  A  review of systems has been performed and if not noted above is otherwise negative.   Objective: Vital Signs: Blood pressure 113/61, pulse 78, temperature 98.7 F (37.1 C), temperature source Oral, resp. rate 17, weight 52.7 kg (116 lb 2.9 oz), SpO2 95 %. Ct Head Wo Contrast  03/29/2014   CLINICAL DATA:  80 year old female confused. History of subarachnoid hemorrhage with aneurysm treated with clipping. Subsequent encounter.  EXAM: CT HEAD WITHOUT CONTRAST  TECHNIQUE: Contiguous axial images were obtained from the base of the skull through the vertex without intravenous contrast.  COMPARISON:  Several prior exams most recent is a head CT from 03/20/2014.  FINDINGS: Ventricular shunt is in place entering from the right posterior temporal -parietal region with the catheter extending through portion of the right lateral ventricle, third ventricle with the tip in the region of the genu of the left internal capsule.  Ventricular size similar to the 03/20/2014 exam, minimally more prominent than the 03/16/2014 exam although significantly improved when compared to 03/10/2014 exam.  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  Previously noted residual tiny amount of blood within the right sylvian fissure has cleared. No new intracranial hemorrhage detected.  Small vessel disease type changes. No CT evidence of large acute thrombotic infarct.  No intracranial mass lesion noted on this unenhanced exam.  IMPRESSION: Ventricular shunt with the tip in the region of the genu of the left internal capsule.  Ventricular size similar to the 03/20/2014 exam, minimally more prominent than the 03/16/2014 exam although significantly improved when compared to 03/10/2014 exam.  Artifact caused by coil utilized for right posterior  communicating artery aneurysm.  Previously noted residual tiny amount of blood within the right sylvian fissure has cleared. No new intracranial hemorrhage detected.  Small vessel disease type changes. No CT evidence of large acute thrombotic infarct.   Electronically Signed   By: Chauncey Cruel M.D.   On: 03/29/2014 08:11    Recent Labs  03/28/14 0410  WBC 6.6  HGB 10.7*  HCT 33.3*  PLT 224    Recent Labs  03/28/14 0410  NA 135  K 4.2  CL 99  GLUCOSE 99  BUN 11  CREATININE 0.68  CALCIUM 8.5   CBG (last 3)  No results for input(s): GLUCAP in the last 72 hours.  Wt Readings from Last 3 Encounters:  03/24/14 52.7 kg (116 lb 2.9 oz)  03/18/14 52.4 kg (115 lb 8.3 oz)  10/11/13 57.607 kg (127 lb)    Physical Exam:  Constitutional: She appears well-developed.  HENT:  Head: Normocephalic.  Eyes: EOM are normal.  Neck: Normal range of motion. Neck supple. No thyromegaly present.  Cardiovascular: Normal rate and regular rhythm.  Respiratory: Effort normal and breath sounds normal. No respiratory distress.  GI: Soft. Bowel sounds are normal. She exhibits no distension.  Neurological: confused, restless, internally distracted.    RUE: 5/5 Deltoid, 5/5 Biceps, 5/5 Triceps, 5/5 Wrist Ext, 5/5 Grip LUE: 5/5 Deltoid, 5/5 Biceps, 5/5 Triceps, 5/5 Wrist Ext, 5/5 Grip RLE: HF 5/5, KE 5/5, KF 5/5, ADF 5/5, APF 5/5 LLE: HF 5/5, KE 5/5, HF, 5/5, ADF 5/5, APF 5/5   Skin: Skin is warm and dry  Assessment/Plan: 1. Functional deficits secondary to SAH/right posterior communicating artery aneurysm which require 3+ hours per day of interdisciplinary therapy  in a comprehensive inpatient rehab setting. Physiatrist is providing close team supervision and 24 hour management of active medical problems listed below. Physiatrist and rehab team continue to assess barriers to discharge/monitor patient progress toward functional and medical goals.  Family has decided to pursue SNF given  ongoing cognitive deficits.   FIM: FIM - Bathing Bathing Steps Patient Completed: Right Arm, Left Arm, Right upper leg, Left upper leg Bathing: 2: Max-Patient completes 3-4 34f 10 parts or 25-49%  FIM - Upper Body Dressing/Undressing Upper body dressing/undressing steps patient completed: Hook/unhook bra, Thread/unthread right sleeve of pullover shirt/dresss, Thread/unthread left sleeve of pullover shirt/dress, Put head through opening of pull over shirt/dress, Pull shirt over trunk Upper body dressing/undressing: 4: Min-Patient completed 75 plus % of tasks FIM - Lower Body Dressing/Undressing Lower body dressing/undressing steps patient completed: Thread/unthread right underwear leg, Thread/unthread left underwear leg, Pull underwear up/down, Thread/unthread right pants leg, Thread/unthread left pants leg, Pull pants up/down, Don/Doff right sock, Don/Doff left sock, Don/Doff right shoe, Fasten/unfasten right shoe, Don/Doff left shoe, Fasten/unfasten left shoe Lower body dressing/undressing: 4: Steadying Assist  FIM - Toileting Toileting steps completed by patient: Performs perineal hygiene Toileting Assistive Devices: Grab bar or rail for support Toileting: 2: Max-Patient completed 1 of 3 steps  FIM - Radio producer Devices: Grab bars Toilet Transfers: 4-From toilet/BSC: Min A (steadying Pt. > 75%), 4-To toilet/BSC: Min A (steadying Pt. > 75%)  FIM - Bed/Chair Transfer Bed/Chair Transfer Assistive Devices: Arm rests Bed/Chair Transfer: 4: Supine > Sit: Min A (steadying Pt. > 75%/lift 1 leg), 4: Sit > Supine: Min A (steadying pt. > 75%/lift 1 leg), 4: Bed > Chair or W/C: Min A (steadying Pt. > 75%), 4: Chair or W/C > Bed: Min A (steadying Pt. > 75%)  FIM - Locomotion: Wheelchair Locomotion: Wheelchair: 0: Activity did not occur FIM - Locomotion: Ambulation Locomotion: Ambulation Assistive Devices:  (HHA) Ambulation/Gait Assistance: 4: Min assist, 4: Min  guard Locomotion: Ambulation: 4: Travels 150 ft or more with minimal assistance (Pt.>75%)  Comprehension Comprehension Mode: Auditory Comprehension: 2-Understands basic 25 - 49% of the time/requires cueing 51 - 75% of the time  Expression Expression Mode: Verbal Expression: 1-Expresses basis less than 25% of the time/requires cueing greater than 75% of the time.  Social Interaction Social Interaction: 2-Interacts appropriately 25 - 49% of time - Needs frequent redirection.  Problem Solving Problem Solving: 1-Solves basic less than 25% of the time - needs direction nearly all the time or does not effectively solve problems and may need a restraint for safety  Memory Memory: 1-Recognizes or recalls less than 25% of the time/requires cueing greater than 75% of the time  Medical Problem List and Plan: 1. Functional deficits secondary to right-sided SAH/right posterior communicating artery aneurysm status post coiling 03/05/2014 with communicating hydrocephalus status post VP shunt 03/15/2014  -AMS---likely multifactorial due to hydrocephalus/SAH, poor sleep/wake, meds  - Pseudomonas sensitive to cipro--continue for 7 day course  -trazodone increased to 50mg  hs with good results  -remains quite confused and internally distracted---increased ritalin to 10mg   -CT of head today reveals stable edema/ventricles and resolution of blood 2. DVT Prophylaxis/Anticoagulation: SCDs.   3. Pain Management: Tylenol as needed 4. Hyperlipidemia. Zocor 5. Neuropsych: This patient is not capable of making decisions on her own behalf.   -continue ritalin as above to improve day time arousal, focus/attention  -increased trazodone. 6. Skin/Wound Care: Routine skin checks 7. Fluids/Electrolytes/Nutrition: chemistries ok  -encourage adequate po 8. Vasospasm---nimotop completed LOS (  Days) 12 A FACE TO FACE EVALUATION WAS PERFORMED  Tiahna Cure T 03/30/2014 7:56 AM

## 2014-03-31 ENCOUNTER — Inpatient Hospital Stay (HOSPITAL_COMMUNITY): Payer: PRIVATE HEALTH INSURANCE

## 2014-03-31 ENCOUNTER — Inpatient Hospital Stay (HOSPITAL_COMMUNITY): Payer: Medicare Other

## 2014-03-31 ENCOUNTER — Inpatient Hospital Stay (HOSPITAL_COMMUNITY): Payer: Medicare Other | Admitting: Speech Pathology

## 2014-03-31 ENCOUNTER — Encounter (HOSPITAL_COMMUNITY): Payer: PRIVATE HEALTH INSURANCE

## 2014-03-31 NOTE — Progress Notes (Signed)
Physical Therapy Session Note  Patient Details  Name: Sharon [name] MRN: [id number] Date of Birth: [birth year]  Today's Date: 03/31/2014 PT Individual Time: 1000-1100 PT Individual Time Calculation (min): 60 min   Short Term Goals: Week 2:  PT Short Term Goal 1 (Week 2): Pt to perform functional transfers with close(S), 50% of time PT Short Term Goal 2 (Week 2): Pt will ambulate 200' with close(S), 50% of time PT Short Term Goal 3 (Week 2): Pt to maintain dynamic standing balance during functional tasks with close(S), 50% of time PT Short Term Goal 4 (Week 2): Pt will demonstrate sustained attention to functional task up to 64min with max cues PT Short Term Goal 5 (Week 2): Pt will attend to objects on L side during funcitonal tasks with mod cues  Skilled Therapeutic Interventions/Progress Updates:    Pt received seated in geri chair, agreeable to participate in therapy. Session focused on orientation, focused attention, sustained attention, problem solving. Pt ambulated >200' around rehab unit w/ L HHA and overall MinA. Performed car transfer w/ SBA. Pt engaged in pipe tree activity with two easy patterns (cross and field goal). Pt completed cross pattern with 75% accuracy and min cueing. Pt completed field goal pattern with max cueing (therapist pointed to each piece on the picture and pt able to retrieve appropriate piece and place it in correct place). Pt consistently oriented to self, situation, and type of building (hospital) and when presented with two choices ([place] or [facility]) pt correctly picked [facility] both times. Early in session pt had moment of drastically increased memory and orientation and able to engage in appropriate and accurate conversation about buying and selling furniture, however after approximately 3 minutes pt became confused again and began demonstrating perseveration and mild expressive aphasia. During session pt able to consistently attend to task  30-60 seconds at a time depending on environment (decreased attention in environments with increased visual/auditory distractions). Session ended in pt's room, where pt was left seated in geri chair with quick release belt on and boyfriend present w/ all needs within reach.   Therapy Documentation Precautions:  Precautions Precautions: Fall Precaution Comments: perseveration Restrictions Weight Bearing Restrictions: No Pain: Pain Assessment Pain Assessment: No/denies pain  See FIM for current functional status  Therapy/Group: Individual Therapy  Rada Hay  Rada Hay, PT, DPT 03/31/2014, 7:42 AM

## 2014-03-31 NOTE — Progress Notes (Signed)
Occupational Therapy Session Note  Patient Details  Name: Sharon [name] MRN: [id number] Date of Birth: October 06, 1934  Today's Date: 03/31/2014 OT Individual Time: 0900-1000 OT Individual Time Calculation (min): 60 min    Short Term Goals: Week 2:  OT Short Term Goal 1 (Week 2): Pt will gather clothing in preparation for BADLs with steady A OT Short Term Goal 2 (Week 2): Pt will demonstrate sustained attention for 10 mins during bathing session OT Short Term Goal 3 (Week 2): Pt will demonstrate intellectual awareness of deficitis with min verbal cues OT Short Term Goal 4 (Week 2): Pt will initiate bathing tasks with min multimodal cues  Skilled Therapeutic Interventions/Progress Updates:    Pt engaged in functional amb with min HHA for simple home mgmt tasks and simple kitchen tasks.  Pt required max/total verbal cues for task initiation/sequencing after directions presented.  Pt required max verbal cues for redirection to task.  Pt sustained attention for approx 10 seconds before redirection.  Pt oriented to person and situation only.  Pt required tot A for orientation to place and date.  Pt able to name only 2 of her children.  Pt unable to state how long she has been in [employer].  Focus on activity tolerance, functional amb with HHA for home mgmt tasks, and cognitive remediation including orientation, task initiation, sequencing, and attention to task.  Therapy Documentation Precautions:  Precautions Precautions: Fall Precaution Comments: perseveration Restrictions Weight Bearing Restrictions: No  Pain: Pain Assessment Pain Assessment: No/denies pain  See FIM for current functional status  Therapy/Group: Individual Therapy  Leroy Libman 03/31/2014, 10:12 AM

## 2014-03-31 NOTE — Plan of Care (Signed)
Problem: RH Bathing Goal: LTG Patient will bathe with assist, cues/equipment (OT) LTG: Patient will bathe specified number of body parts with assist with/without cues using equipment (position) (OT)   Downgraded secondary to slow progression towards goals  Problem: RH Toileting Goal: LTG Patient will perform toileting w/assist, cues/equip (OT) LTG: Patient will perform toiletiing (clothes management/hygiene) with assist, with/without cues using equipment (OT)  Downgraded secondary to slow progression towards goals  Problem: RH Simple Meal Prep Goal: LTG Patient will perform simple meal prep w/assist (OT) LTG: Patient will perform simple meal prep with assistance, with/without cues (OT).  Outcome: Not Applicable Date Met:  83/25/49 Goal no longer appropriate for pt as she is now discharging to SNF.  Problem: RH Toilet Transfers Goal: LTG Patient will perform toilet transfers w/assist (OT) LTG: Patient will perform toilet transfers with assist, with/without cues using equipment (OT)  Downgraded secondary to slow progression towards goals  Problem: RH Tub/Shower Transfers Goal: LTG Patient will perform tub/shower transfers w/assist (OT) LTG: Patient will perform tub/shower transfers with assist, with/without cues using equipment (OT)  Downgraded secondary to slow progression towards goals  Problem: RH Attention Goal: LTG Patient will demonstrate focused/sustained (OT) LTG: Patient will demonstrate focused/sustained/selective/alternating/divided attention during functional activities in specific environment with assist for # of minutes (OT)  Downgraded secondary to slow progression towards goals  Problem: RH Awareness Goal: LTG: Patient will demonstrate intellectual/emergent (OT) LTG: Patient will demonstrate intellectual/emergent/anticipatory awareness with assist during a functional activity (OT)  Downgraded secondary to slow progression towards goals

## 2014-03-31 NOTE — Progress Notes (Addendum)
Physical Therapy Session Note  Patient Details  Name: Sharon [name] MRN: [id number] Date of Birth: [birth year]  Today's Date: 03/31/2014 PT Individual Time: 1000-1100 PT Individual Time Calculation (min): 60 min   Short Term Goals: Week 2:  PT Short Term Goal 1 (Week 2): Pt to perform functional transfers with close(S), 50% of time PT Short Term Goal 2 (Week 2): Pt will ambulate 200' with close(S), 50% of time PT Short Term Goal 3 (Week 2): Pt to maintain dynamic standing balance during functional tasks with close(S), 50% of time PT Short Term Goal 4 (Week 2): Pt will demonstrate sustained attention to functional task up to 23min with max cues PT Short Term Goal 5 (Week 2): Pt will attend to objects on L side during funcitonal tasks with mod cues     Skilled Therapeutic Interventions/Progress Updates:  Pt awake and alert this tx; received sitting in recliner with tray in front.  neuromuscular re-education via VCs, demo, manual cues for:  -Gait with hand hold assist x 150' x 1 with mild LOB to L during 1st trial; close supervision 2nd trial while carrying folded pillow cases over L forearm -dual task in standing- holding heavy door open with 1 hand and body, using R hand to manipulate key in lock successfully -standing x 1.5 minutes while folding 6 pillow cases appropriately scanning R><L, and placing on table, without cues -up/down 4 steps 2 rails x 2, self selecting step- through ascending and step- to descending  When negotiating steps, pt was inattentive about how well she placed feet completely on tread, and did not respond to VCs.  Pt required mod cues for L attention during gait to locate rehab gym on L.  Pt returned to Nurse's station in recliner for observation, quick release belt in place.    Therapy Documentation Precautions:  Precautions Precautions: Fall Precaution Comments: can be impulsive at times Restrictions Weight Bearing Restrictions: No  Pt oriented to name,  general location, season of year.    Pain: Pain Assessment Pain Assessment: No/denies pain   Other Treatments: Treatments Therapeutic Activity: standing to fold pillow cases with table in front, transport pillow cases while ambulating, use PT's key to open locked door  See FIM for current functional status  Therapy/Group: Individual Therapy  Khing Belcher,Lanora 03/31/2014, 12:30 PM

## 2014-03-31 NOTE — Progress Notes (Signed)
[facility] PHYSICAL MEDICINE & REHABILITATION     PROGRESS NOTE    Subjective/Complaints: Slept well again per sitter. Got up to go to the bathroom a couple times and went back to sleep.  A  review of systems has been performed and if not noted above is otherwise negative.   Objective: Vital Signs: Blood pressure 107/53, pulse 80, temperature 98.3 F (36.8 C), temperature source Axillary, resp. rate 17, weight 52.7 kg (116 lb 2.9 oz), SpO2 98 %. No results found. No results for input(s): WBC, HGB, HCT, PLT in the last 72 hours. No results for input(s): NA, K, CL, GLUCOSE, BUN, CREATININE, CALCIUM in the last 72 hours.  Invalid input(s): CO CBG (last 3)  No results for input(s): GLUCAP in the last 72 hours.  Wt Readings from Last 3 Encounters:  03/24/14 52.7 kg (116 lb 2.9 oz)  03/18/14 52.4 kg (115 lb 8.3 oz)  10/11/13 57.607 kg (127 lb)    Physical Exam:  Constitutional: She appears well-developed.  HENT:  Head: Normocephalic.  Eyes: EOM are normal.  Neck: Normal range of motion. Neck supple. No thyromegaly present.  Cardiovascular: Normal rate and regular rhythm.  Respiratory: Effort normal and breath sounds normal. No respiratory distress.  GI: Soft. Bowel sounds are normal. She exhibits no distension.  Neurological: confused, restless, internally distracted.    RUE: 5/5 Deltoid, 5/5 Biceps, 5/5 Triceps, 5/5 Wrist Ext, 5/5 Grip LUE: 5/5 Deltoid, 5/5 Biceps, 5/5 Triceps, 5/5 Wrist Ext, 5/5 Grip RLE: HF 5/5, KE 5/5, KF 5/5, ADF 5/5, APF 5/5 LLE: HF 5/5, KE 5/5, HF, 5/5, ADF 5/5, APF 5/5   Skin: Skin is warm and dry  Assessment/Plan: 1. Functional deficits secondary to SAH/right posterior communicating artery aneurysm which require 3+ hours per day of interdisciplinary therapy in a comprehensive inpatient rehab setting. Physiatrist is providing close team supervision and 24 hour management of active medical problems listed below. Physiatrist and rehab team  continue to assess barriers to discharge/monitor patient progress toward functional and medical goals.  Family has decided to pursue SNF given ongoing cognitive deficits.   FIM: FIM - Bathing Bathing Steps Patient Completed: Right Arm, Left Arm, Right upper leg, Left upper leg Bathing: 2: Max-Patient completes 3-4 27f 10 parts or 25-49%  FIM - Upper Body Dressing/Undressing Upper body dressing/undressing steps patient completed: Hook/unhook bra, Thread/unthread right sleeve of pullover shirt/dresss, Thread/unthread left sleeve of pullover shirt/dress, Put head through opening of pull over shirt/dress, Pull shirt over trunk Upper body dressing/undressing: 4: Min-Patient completed 75 plus % of tasks FIM - Lower Body Dressing/Undressing Lower body dressing/undressing steps patient completed: Thread/unthread right underwear leg, Thread/unthread left underwear leg, Pull underwear up/down, Thread/unthread right pants leg, Thread/unthread left pants leg, Pull pants up/down, Don/Doff right sock, Don/Doff left sock, Don/Doff right shoe, Fasten/unfasten right shoe, Don/Doff left shoe, Fasten/unfasten left shoe Lower body dressing/undressing: 4: Steadying Assist  FIM - Toileting Toileting steps completed by patient: Performs perineal hygiene Toileting Assistive Devices: Grab bar or rail for support Toileting: 1: Total-Patient completed zero steps, helper did all 3  FIM - Radio producer Devices: Grab bars Toilet Transfers: 4-From toilet/BSC: Min A (steadying Pt. > 75%), 4-To toilet/BSC: Min A (steadying Pt. > 75%)  FIM - Bed/Chair Transfer Bed/Chair Transfer Assistive Devices: Arm rests Bed/Chair Transfer: 4: Supine > Sit: Min A (steadying Pt. > 75%/lift 1 leg), 4: Bed > Chair or W/C: Min A (steadying Pt. > 75%), 4: Chair or W/C > Bed: Min A (steadying  Pt. > 75%)  FIM - Locomotion: Wheelchair Locomotion: Wheelchair: 0: Activity did not occur FIM - Locomotion:  Ambulation Locomotion: Ambulation Assistive Devices:  (HHA) Ambulation/Gait Assistance: 4: Min assist Locomotion: Ambulation: 4: Travels 150 ft or more with minimal assistance (Pt.>75%)  Comprehension Comprehension Mode: Auditory Comprehension: 1-Understands basic less than 25% of the time/requires cueing 75% of the time  Expression Expression Mode: Verbal Expression: 1-Expresses basis less than 25% of the time/requires cueing greater than 75% of the time.  Social Interaction Social Interaction: 1-Interacts appropriately less than 25% of the time. May be withdrawn or combative.  Problem Solving Problem Solving: 1-Solves basic less than 25% of the time - needs direction nearly all the time or does not effectively solve problems and may need a restraint for safety  Memory Memory: 1-Recognizes or recalls less than 25% of the time/requires cueing greater than 75% of the time  Medical Problem List and Plan: 1. Functional deficits secondary to right-sided SAH/right posterior communicating artery aneurysm status post coiling 03/05/2014 with communicating hydrocephalus status post VP shunt 03/15/2014  -AMS---likely multifactorial due to hydrocephalus/SAH, poor sleep/wake, meds  - Pseudomonas sensitive to cipro 7 day course complete  -trazodone increased to 50mg  hs with good results  -remains quite confused and internally distracted---increased ritalin to 10mg   -CT of head today reveals stable edema/ventricles and resolution of blood 2. DVT Prophylaxis/Anticoagulation: SCDs.   3. Pain Management: Tylenol as needed 4. Hyperlipidemia. Zocor 5. Neuropsych: This patient is not capable of making decisions on her own behalf.   -continue ritalin as above to improve day time arousal, focus/attention  -increased trazodone. 6. Skin/Wound Care: Routine skin checks 7. Fluids/Electrolytes/Nutrition: chemistries ok  -encourage adequate po 8. Vasospasm---nimotop completed LOS (Days) 13 A FACE TO  FACE EVALUATION WAS PERFORMED  SWARTZ,ZACHARY T 03/31/2014 8:31 AM

## 2014-03-31 NOTE — Progress Notes (Signed)
Speech Language Pathology Weekly Progress and Session Note  Patient Details  Name: Sharon [name] MRN: [id number] Date of Birth: [birth year]  Beginning of progress report period: March 25, 2014 End of progress report period: March 31, 2014  Today's Date: 03/31/2014 SLP Individual Time: 1510-1610 SLP Individual Time Calculation (min): 60 min  Short Term Goals: Week 2: SLP Short Term Goal 1 (Week 2): Patient will demonstrate sustained attention to a functional task for 1 minute with Max A multimodal cues. SLP Short Term Goal 1 - Progress (Week 2): Progressing toward goal SLP Short Term Goal 2 (Week 2): Patient will identify 1 cognitive deficit with Mod A multimodal cues.  SLP Short Term Goal 2 - Progress (Week 2): Progressing toward goal SLP Short Term Goal 3 (Week 2): Patient will demonstrate basic problem solving for functional, familiar tasks with Mod A multimodal cues with 50% of opportunities.  SLP Short Term Goal 3 - Progress (Week 2): Progressing toward goal SLP Short Term Goal 4 (Week 2): Patient will utilize the call bell to request assistance with Max A multimodal cues with 25% of opportunities SLP Short Term Goal 4 - Progress (Week 2): Progressing toward goal SLP Short Term Goal 5 (Week 2): Patient will utilize small bites and a slow rate of self-feeding to minimize aspiration risk with current diet with Mod A multimodal cues.  SLP Short Term Goal 5 - Progress (Week 2): Progressing toward goal SLP Short Term Goal 6 (Week 2): Patient will utilize external memory aids to orient to date and situation with Mod A multimodal cues.  SLP Short Term Goal 6 - Progress (Week 2): Progressing toward goal    New Short Term Goals: Week 3: SLP Short Term Goal 1 (Week 3): Patient will demonstrate sustained attention to a functional task for 1 minute with Max A multimodal cues. SLP Short Term Goal 2 (Week 3): Patient will identify 1 cognitive deficit with Mod A multimodal cues.  SLP Short  Term Goal 3 (Week 3): Patient will demonstrate basic problem solving for functional, familiar tasks with Mod A multimodal cues with 50% of opportunities.  SLP Short Term Goal 4 (Week 3): Patient will utilize the call bell to request assistance with Max A multimodal cues with 25% of opportunities SLP Short Term Goal 5 (Week 3): Patient will utilize small bites and a slow rate of self-feeding to minimize aspiration risk with current diet with Mod A multimodal cues.  SLP Short Term Goal 6 (Week 3): Patient will utilize external memory aids to orient to date and situation with Mod A multimodal cues.   Weekly Progress Updates: Patient has made minimal functional gains this reporting period and as a result has met 0 of 6 short term goals this reporting period.  Patient's impaired sustained attention has resulted in inability to complete any higher level task without Max-Total assist cuing.  While it was not enough of a gain to meet her goals she has demonstrated an increased ability to sustain attention, from 3-5 seconds to 10-15 seconds.  She remains inconsistent in her ability to utilize presented external aids and accurately answer biographical questions.  Given that patient continues to require Max-Total assist for all basic self-care tasks and family education is ongoing, patient continues to require skilled SLP services in order to maximize her functional independence prior to discharge with 24/7 assist.   Intensity: Minumum of 1-2 x/day, 30 to 90 minutes Frequency: 5 out of 7 days Duration/Length of Stay: 2/2 Treatment/Interventions: Cognitive remediation/compensation;Cueing hierarchy;Dysphagia/aspiration  precaution training;Environmental controls;Functional tasks;Internal/external aids;Patient/family education;Therapeutic Activities   Daily Session  Skilled Therapeutic Interventions:  Skilled treatment session focused on addressing cognitive goals.  Patient required Max assist for orientation to  person, time, place and situation.  Patient was able to state name but required cuing for date of birth and age; additionally, she was unable to state place but could state that she had a bleed in her brain.  SLP facilitated session with Max assist multimodal cues for 5-10 seconds of sustained attention to a basic, familiar task.  Patient was able to verbalize need to use the bathroom but required Total assist multimodal cues for functional problem solving and sequencing during this basic, familiar self-care task.  Patient demonstrated difficulty with initiation as well as timely cessation with completion of hand hygiene.  Continue with current plan of care.         FIM:  Comprehension Comprehension Mode: Auditory Comprehension: 2-Understands basic 25 - 49% of the time/requires cueing 51 - 75% of the time Expression Expression Mode: Verbal Expression: 2-Expresses basic 25 - 49% of the time/requires cueing 50 - 75% of the time. Uses single words/gestures. Social Interaction Social Interaction: 2-Interacts appropriately 25 - 49% of time - Needs frequent redirection. Problem Solving Problem Solving: 1-Solves basic less than 25% of the time - needs direction nearly all the time or does not effectively solve problems and may need a restraint for safety Memory Memory: 2-Recognizes or recalls 25 - 49% of the time/requires cueing 51 - 75% of the time FIM - Eating Eating Activity: 5: Set-up assist for open containers General    Pain Pain Assessment Pain Assessment: No/denies pain  Therapy/Group: Individual Therapy  Carmelia Roller., CCC-SLP [phone]  [place] 03/31/2014, 8:11 PM

## 2014-04-01 ENCOUNTER — Inpatient Hospital Stay (HOSPITAL_COMMUNITY): Payer: Medicare Other | Admitting: Speech Pathology

## 2014-04-01 ENCOUNTER — Encounter (HOSPITAL_COMMUNITY): Payer: PRIVATE HEALTH INSURANCE

## 2014-04-01 ENCOUNTER — Inpatient Hospital Stay (HOSPITAL_COMMUNITY): Payer: Medicare Other

## 2014-04-01 DIAGNOSIS — B009 Herpesviral infection, unspecified: Secondary | ICD-10-CM

## 2014-04-01 MED ORDER — ACYCLOVIR 5 % EX OINT
TOPICAL_OINTMENT | CUTANEOUS | Status: DC
  Administered 2014-04-01 – 2014-04-03 (×12): via TOPICAL
  Administered 2014-04-03 – 2014-04-04 (×2): 1 via TOPICAL
  Administered 2014-04-04 (×2): via TOPICAL
  Administered 2014-04-04: 1 via TOPICAL
  Administered 2014-04-05 (×2): via TOPICAL
  Filled 2014-04-01: qty 5

## 2014-04-01 NOTE — Progress Notes (Signed)
Social Work Patient ID: Sharon [name], female   DOB: 06-Apr-1934, 80 y.o.   MRN: [id number]   Spoke with pt's daughters, Maudie Mercury and Legrand Rams, via phone yesterday to review team conference and discuss d/c planning status.  Both encouraged by some gains made with cognition, however, pt still with slow progress.  Have received a SNF bed offer from [facility] and they could admit on Monday 2/1.  [facility] has accepted this bed and tx team aware.  Will continue to follow.  Danilo Cappiello, LCSW

## 2014-04-01 NOTE — Progress Notes (Signed)
[place] PHYSICAL MEDICINE & REHABILITATION     PROGRESS NOTE    Subjective/Complaints: Another good night of sleep. 2 sores on right low back per staff/family  A  review of systems has been performed and if not noted above is otherwise negative.   Objective: Vital Signs: Blood pressure 113/54, pulse 88, temperature 98.3 F (36.8 C), temperature source Oral, resp. rate 17, weight 52.7 kg (116 lb 2.9 oz), SpO2 95 %. No results found. No results for input(s): WBC, HGB, HCT, PLT in the last 72 hours. No results for input(s): NA, K, CL, GLUCOSE, BUN, CREATININE, CALCIUM in the last 72 hours.  Invalid input(s): CO CBG (last 3)  No results for input(s): GLUCAP in the last 72 hours.  Wt Readings from Last 3 Encounters:  03/24/14 52.7 kg (116 lb 2.9 oz)  03/18/14 52.4 kg (115 lb 8.3 oz)  10/11/13 57.607 kg (127 lb)    Physical Exam:  Constitutional: She appears well-developed.  HENT:  Head: Normocephalic.  Eyes: EOM are normal.  Neck: Normal range of motion. Neck supple. No thyromegaly present.  Cardiovascular: Normal rate and regular rhythm.  Respiratory: Effort normal and breath sounds normal. No respiratory distress.  GI: Soft. Bowel sounds are normal. She exhibits no distension.  Neurological: confused, restless, internally distracted.    RUE: 5/5 Deltoid, 5/5 Biceps, 5/5 Triceps, 5/5 Wrist Ext, 5/5 Grip LUE: 5/5 Deltoid, 5/5 Biceps, 5/5 Triceps, 5/5 Wrist Ext, 5/5 Grip RLE: HF 5/5, KE 5/5, KF 5/5, ADF 5/5, APF 5/5 LLE: HF 5/5, KE 5/5, HF, 5/5, ADF 5/5, APF 5/5   Skin: Skin is warm and dry. Two 1cm maculopapular lesions with golden crust on right hip---no drainage  Assessment/Plan: 1. Functional deficits secondary to SAH/right posterior communicating artery aneurysm which require 3+ hours per day of interdisciplinary therapy in a comprehensive inpatient rehab setting. Physiatrist is providing close team supervision and 24 hour management of active medical  problems listed below. Physiatrist and rehab team continue to assess barriers to discharge/monitor patient progress toward functional and medical goals.  Family has decided to pursue SNF given ongoing cognitive deficits.   FIM: FIM - Bathing Bathing Steps Patient Completed: Right Arm, Left Arm, Right upper leg, Left upper leg Bathing: 2: Max-Patient completes 3-4 28f 10 parts or 25-49%  FIM - Upper Body Dressing/Undressing Upper body dressing/undressing steps patient completed: Hook/unhook bra, Thread/unthread right sleeve of pullover shirt/dresss, Thread/unthread left sleeve of pullover shirt/dress, Put head through opening of pull over shirt/dress, Pull shirt over trunk Upper body dressing/undressing: 4: Min-Patient completed 75 plus % of tasks FIM - Lower Body Dressing/Undressing Lower body dressing/undressing steps patient completed: Thread/unthread right underwear leg, Thread/unthread left underwear leg, Pull underwear up/down, Thread/unthread right pants leg, Thread/unthread left pants leg, Pull pants up/down, Don/Doff right sock, Don/Doff left sock, Don/Doff right shoe, Fasten/unfasten right shoe, Don/Doff left shoe, Fasten/unfasten left shoe Lower body dressing/undressing: 4: Steadying Assist  FIM - Toileting Toileting steps completed by patient: Performs perineal hygiene, Adjust clothing prior to toileting, Adjust clothing after toileting Toileting Assistive Devices: Grab bar or rail for support Toileting: 4: Steadying assist  FIM - Radio producer Devices: Grab bars Toilet Transfers: 4-From toilet/BSC: Min A (steadying Pt. > 75%), 4-To toilet/BSC: Min A (steadying Pt. > 75%)  FIM - Bed/Chair Transfer Bed/Chair Transfer Assistive Devices: Arm rests Bed/Chair Transfer: 4: Bed > Chair or W/C: Min A (steadying Pt. > 75%), 4: Chair or W/C > Bed: Min A (steadying Pt. > 75%)  FIM -  Locomotion: Wheelchair Locomotion: Wheelchair: 0: Activity did not  occur FIM - Locomotion: Ambulation Locomotion: Ambulation Assistive Devices:  (handhold assist) Ambulation/Gait Assistance: 4: Min assist Locomotion: Ambulation: 4: Travels 150 ft or more with minimal assistance (Pt.>75%)  Comprehension Comprehension Mode: Auditory Comprehension: 2-Understands basic 25 - 49% of the time/requires cueing 51 - 75% of the time  Expression Expression Mode: Verbal Expression: 2-Expresses basic 25 - 49% of the time/requires cueing 50 - 75% of the time. Uses single words/gestures.  Social Interaction Social Interaction: 2-Interacts appropriately 25 - 49% of time - Needs frequent redirection.  Problem Solving Problem Solving: 1-Solves basic less than 25% of the time - needs direction nearly all the time or does not effectively solve problems and may need a restraint for safety  Memory Memory: 2-Recognizes or recalls 25 - 49% of the time/requires cueing 51 - 75% of the time  Medical Problem List and Plan: 1. Functional deficits secondary to right-sided SAH/right posterior communicating artery aneurysm status post coiling 03/05/2014 with communicating hydrocephalus status post VP shunt 03/15/2014  -AMS---likely multifactorial due to hydrocephalus/SAH, poor sleep/wake, meds  - Pseudomonas sensitive to cipro 7 day course complete--recheck urine culture given persistent impairment of mental status  -trazodone increased to 50mg  hs with good results  -remains quite confused and internally distracted---increased ritalin to 10mg   -CT of head this week reveals stable edema/ventricles and resolution of blood 2. DVT Prophylaxis/Anticoagulation: SCDs.   3. Pain Management: Tylenol as needed 4. Hyperlipidemia. Zocor 5. Neuropsych: This patient is not capable of making decisions on her own behalf.   -continue ritalin as above to improve day time arousal, focus/attention  -increased trazodone. 6. Skin/Wound Care: Routine skin checks 7. Fluids/Electrolytes/Nutrition:  chemistries ok  -encourage adequate po 8. Vasospasm---nimotop completed 9. 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

## 2014-04-01 NOTE — Progress Notes (Signed)
Pt refused to wake up to take morning Ritalin. RN attempted with the assistance of sitter and family multiple times and Pt continued to refuse. Pt denied sitting up at all. Rn documented this in the [facility].

## 2014-04-01 NOTE — Progress Notes (Signed)
Speech Language Pathology Daily Session Note  Patient Details  Name: Sharon [name] MRN: [id number] Date of Birth: January 06, 1935  Today's Date: 04/01/2014 SLP Individual Time: 1400-1500 SLP Individual Time Calculation (min): 60 min  Short Term Goals: Week 3: SLP Short Term Goal 1 (Week 3): Patient will demonstrate sustained attention to a functional task for 1 minute with Max A multimodal cues. SLP Short Term Goal 2 (Week 3): Patient will identify 1 cognitive deficit with Mod A multimodal cues.  SLP Short Term Goal 3 (Week 3): Patient will demonstrate basic problem solving for functional, familiar tasks with Mod A multimodal cues with 50% of opportunities.  SLP Short Term Goal 4 (Week 3): Patient will utilize the call bell to request assistance with Max A multimodal cues with 25% of opportunities SLP Short Term Goal 5 (Week 3): Patient will utilize small bites and a slow rate of self-feeding to minimize aspiration risk with current diet with Mod A multimodal cues.  SLP Short Term Goal 6 (Week 3): Patient will utilize external memory aids to orient to date and situation with Mod A multimodal cues.   Skilled Therapeutic Interventions: Skilled treatment session focused on addressing cognitive goals. Patient received from daughters who were leaving; after saying good bye patient perseverated on finding daughters and pocketbook for 30 minutes (half of session).  Patient was restless and walked around unit with Min physical assist.  SLP able to redirect patient with friend/visitor and discussion regarding orientation; patient required Total assist for orientation to time, place and situation today.  Patient was able to verbalize need to use the bathroom and required Max assist multimodal cues for functional problem solving during basic, familiar self-care task.  Continue with current plan of care.       FIM:  Comprehension Comprehension Mode: Auditory Comprehension: 2-Understands basic 25 - 49% of the  time/requires cueing 51 - 75% of the time Expression Expression Mode: Verbal Expression: 2-Expresses basic 25 - 49% of the time/requires cueing 50 - 75% of the time. Uses single words/gestures. Social Interaction Social Interaction: 2-Interacts appropriately 25 - 49% of time - Needs frequent redirection. Problem Solving Problem Solving: 1-Solves basic less than 25% of the time - needs direction nearly all the time or does not effectively solve problems and may need a restraint for safety Memory Memory: 2-Recognizes or recalls 25 - 49% of the time/requires cueing 51 - 75% of the time  Pain Pain Assessment Pain Assessment: No/denies pain  Therapy/Group: Individual Therapy  Carmelia Roller., [place] [phone]  [place] 04/01/2014, 8:46 PM

## 2014-04-01 NOTE — Progress Notes (Signed)
Physical Therapy Session Note  Patient Details  Name: Sharon [name] MRN: [id number] Date of Birth: January 09, 1935  Today's Date: 04/01/2014 PT Individual Time: 1100-1200 PT Individual Time Calculation (min): 60 min   Short Term Goals: Week 2:  PT Short Term Goal 1 (Week 2): Pt to perform functional transfers with close(S), 50% of time PT Short Term Goal 2 (Week 2): Pt will ambulate 200' with close(S), 50% of time PT Short Term Goal 3 (Week 2): Pt to maintain dynamic standing balance during functional tasks with close(S), 50% of time PT Short Term Goal 4 (Week 2): Pt will demonstrate sustained attention to functional task up to 12min with max cues PT Short Term Goal 5 (Week 2): Pt will attend to objects on L side during funcitonal tasks with mod cues  Skilled Therapeutic Interventions/Progress Updates:  1:1. Pt received sitting in geri chair, ready for therapy. Focus this session on sustained attention, problem solving, memory, initiation, orientation and safety during functional transfers and mobility. Overall, pt req close(S)-intermittent min HHA for multiple bouts of ambulation >200' throughout session in various environments including busy hallways as well as t/f sit<>stand from various heights and arm rest availability. Pt able to perform floor transfer with overall supervision. Pt req min A for safe negotiation up/down 11 steps with use of single rail and HHA with min cues for step-to pattern during descent.   Pt oriented to person and situation only, req total A with use of external cues for place and time. Pt req overall mod-max cues for sustained attention to various tasks including assembling basic pipe tree model (75% accuracy and occasional decreased field of selection) and looking at her company's website online. Looking at personal website did not prompt improved recall of personal information such a profession or interests. Attempted to engage pt in ball toss activity and yoga, but  consistent desire to lie down due to fatigue.   Pt req prompting to use bathroom at start of session, but req overall steadying assist with mod cues for management of clothing, pericare and safe completion of transfer.   Pt left sitting in geri chair with quick release belt in place at nurses station at end of session. When asked how she felt the tx session went, pt stated,"well that was just too much for my brain to handle right now."  Therapy Documentation Precautions:  Precautions Precautions: Fall Precaution Comments: can be impulsive at times Restrictions Weight Bearing Restrictions: No  See FIM for current functional status  Therapy/Group: Individual Therapy  [place], [place] 04/01/2014, 12:09 PM

## 2014-04-01 NOTE — Progress Notes (Signed)
Occupational Therapy Session Note  Patient Details  Name: Sharon [name] MRN: [id number] Date of Birth: 1934/03/26  Today's Date: 04/01/2014 OT Individual Time: 0900-1000 OT Individual Time Calculation (min): 60 min    Short Term Goals: Week 2:  OT Short Term Goal 1 (Week 2): Pt will gather clothing in preparation for BADLs with steady A OT Short Term Goal 2 (Week 2): Pt will demonstrate sustained attention for 10 mins during bathing session OT Short Term Goal 3 (Week 2): Pt will demonstrate intellectual awareness of deficitis with min verbal cues OT Short Term Goal 4 (Week 2): Pt will initiate bathing tasks with min multimodal cues  Skilled Therapeutic Interventions/Progress Updates:    Pt initially engaged in BADL retraining including bathing at shower level and dressing with sit<>stand from EOB. Pt selected clothing prior to amb with HHA into bathroom to use toilet before transferring to shower.  Pt required mod verbal cues for bathing tasks this morning and was agreeable to washing all body parts.  Pt completed all dressing tasks when presented with clothing items.  Pt stood at sink to complete grooming tasks.  Pt amb to ADL apartment for home mgmt tasks with focus on cognitive remediation (orientation, task initiation, attention to task, sequencing).  Pt continues to require tot A for orientation, task initiation, attention to task, and sequencing.    Therapy Documentation Precautions:  Precautions Precautions: Fall Precaution Comments: can be impulsive at times Restrictions Weight Bearing Restrictions: No Pain: Pain Assessment Pain Assessment: No/denies pain   Other Treatments:    See FIM for current functional status  Therapy/Group: Individual Therapy  Leroy Libman 04/01/2014, 12:08 PM

## 2014-04-02 ENCOUNTER — Inpatient Hospital Stay (HOSPITAL_COMMUNITY): Payer: Medicare Other

## 2014-04-02 ENCOUNTER — Inpatient Hospital Stay (HOSPITAL_COMMUNITY): Payer: Medicare Other | Admitting: Speech Pathology

## 2014-04-02 ENCOUNTER — Encounter (HOSPITAL_COMMUNITY): Payer: PRIVATE HEALTH INSURANCE

## 2014-04-02 ENCOUNTER — Inpatient Hospital Stay (HOSPITAL_COMMUNITY): Payer: PRIVATE HEALTH INSURANCE | Admitting: Physical Therapy

## 2014-04-02 NOTE — Progress Notes (Signed)
Occupational Therapy Session Note  Patient Details  Name: Sharon [name] MRN: [id number] Date of Birth: 1934/06/13  Today's Date: 04/02/2014 OT Individual Time: 1100-1145 OT Individual Time Calculation (min): 45 min  and Today's Date: 04/02/2014 OT Missed Time: 15 Minutes Missed Time Reason: Patient fatigue   Short Term Goals: Week 2:  OT Short Term Goal 1 (Week 2): Pt will gather clothing in preparation for BADLs with steady A OT Short Term Goal 2 (Week 2): Pt will demonstrate sustained attention for 10 mins during bathing session OT Short Term Goal 3 (Week 2): Pt will demonstrate intellectual awareness of deficitis with min verbal cues OT Short Term Goal 4 (Week 2): Pt will initiate bathing tasks with min multimodal cues  Skilled Therapeutic Interventions/Progress Updates:    Pt resting in geri-chair on arrival.  Pt adamantly declined shower this morning but was agreeable to using toilet and changing clothing.  Pt required tot A for orientation to place and date.  Pt required min HHA to ambulate to bathroom and completed all toileting tasks with steady A.  Pt required steady A for standing to pull up pants.  Pt required max encouragement throughout session and continually placed head on folded arms when sitting in geri chair with tray.  Attempted to engaged patient in standing tasks but patient continually attempted to sit on any surface available.  Pt missed 15 mins skilled OT services secondary to continued fatigue.  Therapy Documentation Precautions:  Precautions Precautions: Fall Precaution Comments: can be impulsive at times Restrictions Weight Bearing Restrictions: No General: General OT Amount of Missed Time: 15 Minutes  Pain: Pt denied pain  See FIM for current functional status  Therapy/Group: Individual Therapy  Leroy Libman 04/02/2014, 12:05 PM

## 2014-04-02 NOTE — Progress Notes (Signed)
[place] PHYSICAL MEDICINE & REHABILITATION     PROGRESS NOTE    Subjective/Complaints: Slept well last night. No issues per staff A  review of systems has been performed and if not noted above is otherwise negative--limited due to cognition   Objective: Vital Signs: Blood pressure 111/50, pulse 94, temperature 98 F (36.7 C), temperature source Oral, resp. rate 18, weight 52.7 kg (116 lb 2.9 oz), SpO2 95 %. No results found. No results for input(s): WBC, HGB, HCT, PLT in the last 72 hours. No results for input(s): NA, K, CL, GLUCOSE, BUN, CREATININE, CALCIUM in the last 72 hours.  Invalid input(s): CO CBG (last 3)  No results for input(s): GLUCAP in the last 72 hours.  Wt Readings from Last 3 Encounters:  03/24/14 52.7 kg (116 lb 2.9 oz)  03/18/14 52.4 kg (115 lb 8.3 oz)  10/11/13 57.607 kg (127 lb)    Physical Exam:  Constitutional: She appears well-developed.  HENT:  Head: Normocephalic.  Eyes: EOM are normal.  Neck: Normal range of motion. Neck supple. No thyromegaly present.  Cardiovascular: Normal rate and regular rhythm.  Respiratory: Effort normal and breath sounds normal. No respiratory distress.  GI: Soft. Bowel sounds are normal. She exhibits no distension.  Neurological: confused, cooperative.    RUE: 5/5 Deltoid, 5/5 Biceps, 5/5 Triceps, 5/5 Wrist Ext, 5/5 Grip LUE: 5/5 Deltoid, 5/5 Biceps, 5/5 Triceps, 5/5 Wrist Ext, 5/5 Grip RLE: HF 5/5, KE 5/5, KF 5/5, ADF 5/5, APF 5/5 LLE: HF 5/5, KE 5/5, HF, 5/5, ADF 5/5, APF 5/5   Skin: Skin is warm and dry. Two 1cm maculopapular lesions with golden crust on right hip---no drainage/no change  Assessment/Plan: 1. Functional deficits secondary to SAH/right posterior communicating artery aneurysm which require 3+ hours per day of interdisciplinary therapy in a comprehensive inpatient rehab setting. Physiatrist is providing close team supervision and 24 hour management of active medical problems listed  below. Physiatrist and rehab team continue to assess barriers to discharge/monitor patient progress toward functional and medical goals.  SNF pending  FIM: FIM - Bathing Bathing Steps Patient Completed: Chest, Right Arm, Left Arm, Abdomen, Front perineal area, Buttocks, Right lower leg (including foot), Left lower leg (including foot), Left upper leg, Right upper leg Bathing: 4: Steadying assist  FIM - Upper Body Dressing/Undressing Upper body dressing/undressing steps patient completed: Hook/unhook bra, Thread/unthread right sleeve of pullover shirt/dresss, Thread/unthread left sleeve of pullover shirt/dress, Put head through opening of pull over shirt/dress, Pull shirt over trunk, Thread/unthread right bra strap, Thread/unthread left bra strap Upper body dressing/undressing: 5: Supervision: Safety issues/verbal cues FIM - Lower Body Dressing/Undressing Lower body dressing/undressing steps patient completed: Thread/unthread right underwear leg, Thread/unthread left underwear leg, Pull underwear up/down, Thread/unthread right pants leg, Thread/unthread left pants leg, Pull pants up/down, Don/Doff right sock, Don/Doff left sock, Don/Doff right shoe, Fasten/unfasten right shoe, Don/Doff left shoe, Fasten/unfasten left shoe Lower body dressing/undressing: 4: Steadying Assist  FIM - Toileting Toileting steps completed by patient: Performs perineal hygiene, Adjust clothing prior to toileting, Adjust clothing after toileting Toileting Assistive Devices: Grab bar or rail for support Toileting: 4: Steadying assist  FIM - Radio producer Devices: Grab bars Toilet Transfers: 4-To toilet/BSC: Min A (steadying Pt. > 75%), 5-From toilet/BSC: Supervision (verbal cues/safety issues)  FIM - Control and instrumentation engineer Devices: Arm rests Bed/Chair Transfer: 4: Bed > Chair or W/C: Min A (steadying Pt. > 75%), 4: Chair or W/C > Bed: Min A (steadying Pt. >  75%), 4:  Supine > Sit: Min A (steadying Pt. > 75%/lift 1 leg), 4: Sit > Supine: Min A (steadying pt. > 75%/lift 1 leg)  FIM - Locomotion: Wheelchair Locomotion: Wheelchair: 0: Activity did not occur FIM - Locomotion: Ambulation Locomotion: Ambulation Assistive Devices: Other (comment) (intermittent HHA) Ambulation/Gait Assistance: 4: Min guard, 5: Supervision, 4: Min assist Locomotion: Ambulation: 4: Travels 150 ft or more with minimal assistance (Pt.>75%)  Comprehension Comprehension Mode: Auditory Comprehension: 2-Understands basic 25 - 49% of the time/requires cueing 51 - 75% of the time  Expression Expression Mode: Verbal Expression: 2-Expresses basic 25 - 49% of the time/requires cueing 50 - 75% of the time. Uses single words/gestures.  Social Interaction Social Interaction: 2-Interacts appropriately 25 - 49% of time - Needs frequent redirection.  Problem Solving Problem Solving: 1-Solves basic less than 25% of the time - needs direction nearly all the time or does not effectively solve problems and may need a restraint for safety  Memory Memory: 2-Recognizes or recalls 25 - 49% of the time/requires cueing 51 - 75% of the time  Medical Problem List and Plan: 1. Functional deficits secondary to right-sided SAH/right posterior communicating artery aneurysm status post coiling 03/05/2014 with communicating hydrocephalus status post VP shunt 03/15/2014  -AMS---likely multifactorial due to hydrocephalus/SAH, poor sleep/wake, meds  - Pseudomonas sensitive to cipro 7 day course complete--recheck urine culture given persistent impairment of mental status  -trazodone increased to 50mg  hs with good results  -remains quite confused and internally distracted---continue ritalin10mg   -CT of head this week reveals stable edema/ventricles and resolution of blood 2. DVT Prophylaxis/Anticoagulation: SCDs.   3. Pain Management: Tylenol as needed 4. Hyperlipidemia. Zocor 5. Neuropsych: This  patient is not capable of making decisions on her own behalf.   -continue ritalin as above to improve day time arousal, focus/attention  -increased trazodone.  -re-ordered urine culture having completed abx 6. Skin/Wound Care: Routine skin checks 7. Fluids/Electrolytes/Nutrition: chemistries ok  -encourage adequate po 8. Vasospasm---nimotop completed 9. 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

## 2014-04-02 NOTE — Plan of Care (Signed)
Problem: RH Car Transfers Goal: LTG Patient will perform car transfers with assist (PT) LTG: Patient will perform car transfers with assistance (PT).  Outcome: Not Applicable Date Met:  04/02/14 Pt d/c to SNF.  Problem: RH Memory Goal: LTG Patient demonstrate ability for day to day recall (PT) LTG: Patient will demonstrate ability for day to day recall/carryover during mobility activities with assist (PT)  Downgraded due to continued increased level of cues for overall cognition.   Problem: RH Attention Goal: LTG Patient will demonstrate focused/sustained (PT) LTG: Patient will demonstrate focused/sustained/selective/alternating/divided attention during functional mobility in specific environment with assist for # of minutes (PT)  Downgraded due to continued increased level of cues for overall cognition.   Problem: RH Awareness Goal: LTG: Patient will demonstrate intellectual/emergent (PT) LTG: Patient will demonstrate intellectual/emergent/anticipatory awareness with assist during a mobility activity (PT)  Downgraded due to continued increased level of cues for overall cognition.      

## 2014-04-02 NOTE — Progress Notes (Signed)
Physical Therapy Session Note  Patient Details  Name: Sharon [name] MRN: [id number] Date of Birth: 1934-07-18  Today's Date: 04/02/2014 PT Individual Time: 1975-8832 PT Individual Time Calculation (min): 30 min   Short Term Goals: Week 2:  PT Short Term Goal 1 (Week 2): Pt to perform functional transfers with close(S), 50% of time PT Short Term Goal 2 (Week 2): Pt will ambulate 200' with close(S), 50% of time PT Short Term Goal 3 (Week 2): Pt to maintain dynamic standing balance during functional tasks with close(S), 50% of time PT Short Term Goal 4 (Week 2): Pt will demonstrate sustained attention to functional task up to 66min with max cues PT Short Term Goal 5 (Week 2): Pt will attend to objects on L side during funcitonal tasks with mod cues  Skilled Therapeutic Interventions/Progress Updates:     Pt received in hallway accompanied by SLP having just completed session. Current session focused on cognitive remediation with emphasis on sustained attention to functional tasks. Pt oriented to self only and stated location as "a place that has yoga in the back," per pt. Pt performed multiple trials of functional ambulation >150' in controlled and home environments with close supervision to min A, intermittent L HHA. Pt with LOB to L side x2 during ambulation and >5 occurrences during dynamic standing. With max cueing for sustained attention and attention to L visual field, pt engaged in locating single food item in rehab apartment kitchen (placed in leftmost cabinet) with supervision to min A for stability/balance while reaching outside BOS. Throughout session, frequent redirection required secondary to pt attempting to close eyes as well as pt perseveration on removing shoes. Pt left seated in geri chair at nurses' station with quick release belt in place for safety.  Therapy Documentation Precautions:  Precautions Precautions: Fall Precaution Comments: can be impulsive at  times Restrictions Weight Bearing Restrictions: No Vital Signs: Therapy Vitals Temp: 97.7 F (36.5 C) Temp Source: Oral Pulse Rate: 86 Resp: 17 BP: 110/68 mmHg Patient Position (if appropriate): Sitting Oxygen Therapy SpO2: 99 % O2 Device: Not Delivered Pain: Pain Assessment Pain Assessment: No/denies pain  See FIM for current functional status  Therapy/Group: Individual Therapy  Hobble, Malva Cogan 04/02/2014, 5:17 PM

## 2014-04-02 NOTE — Progress Notes (Signed)
Physical Therapy Session Note  Patient Details  Name: Sharon [name] MRN: [id number] Date of Birth: [birth year]  Today's Date: 04/02/2014 PT Individual Time: 0800-0900 PT Individual Time Calculation (min): 60 min   Short Term Goals: Week 2:  PT Short Term Goal 1 (Week 2): Pt to perform functional transfers with close(S), 50% of time PT Short Term Goal 2 (Week 2): Pt will ambulate 200' with close(S), 50% of time PT Short Term Goal 3 (Week 2): Pt to maintain dynamic standing balance during functional tasks with close(S), 50% of time PT Short Term Goal 4 (Week 2): Pt will demonstrate sustained attention to functional task up to 52min with max cues PT Short Term Goal 5 (Week 2): Pt will attend to objects on L side during funcitonal tasks with mod cues  Skilled Therapeutic Interventions/Progress Updates:  1:1.  Pt received sitting in w/c at nurses station, agreeable to therapy. Focus this session on orientation, cognitive remediation, balance, safety during functional transfers and mobility. Pt oriented to person and situation only, req total cues for place and time. Pt req overall close(S)-min A for overall toileting, various furniture transfers, standing balance and multiple bouts of ambulation >150' this session with intermittent HHA due to variable L lean and deviation from straight path.   Pt req mod-max cues for sustained attention, problem solving and memory while engaging in 3 games of "Connect 4" and generating/finding list of items typically found in kitchen. Pt demonstrating overall improved L awareness during functional tasks this session with min A.   Pt frequently putting head down or attempting to lie down during tx session due to report of headache as well as fatigue, req mod cues for redirection. Pt left sitting in geri chair at end of session with quick release belt in place and RN in room to address headache.   Therapy Documentation Precautions:  Precautions Precautions:  Fall Precaution Comments: can be impulsive at times Restrictions Weight Bearing Restrictions: No  See FIM for current functional status  Therapy/Group: Individual Therapy  [name], [name] 04/02/2014, 2:40 PM

## 2014-04-02 NOTE — Progress Notes (Signed)
Speech Language Pathology Daily Session Note  Patient Details  Name: Sharon [name] MRN: [id number] Date of Birth: May 28, 1934  Today's Date: 04/02/2014 SLP Individual Time: 0175-1025 SLP Individual Time Calculation (min): 30 min  Short Term Goals: Week 3: SLP Short Term Goal 1 (Week 3): Patient will demonstrate sustained attention to a functional task for 1 minute with Max A multimodal cues. SLP Short Term Goal 2 (Week 3): Patient will identify 1 cognitive deficit with Mod A multimodal cues.  SLP Short Term Goal 3 (Week 3): Patient will demonstrate basic problem solving for functional, familiar tasks with Mod A multimodal cues with 50% of opportunities.  SLP Short Term Goal 4 (Week 3): Patient will utilize the call bell to request assistance with Max A multimodal cues with 25% of opportunities SLP Short Term Goal 5 (Week 3): Patient will utilize small bites and a slow rate of self-feeding to minimize aspiration risk with current diet with Mod A multimodal cues.  SLP Short Term Goal 6 (Week 3): Patient will utilize external memory aids to orient to date and situation with Mod A multimodal cues.   Skilled Therapeutic Interventions: Skilled treatment session focused on addressing cognitive goals. Patient was restless and walked around unit with Min physical assist, until she found visitors who she recognized.  She sat, visited and ate a doughnut with visitors for ~10 minutes with Mod cues for appropriate topic maintenance.  Patient said good bye to visitors and resumed walking around unit looking for "2 pads."  Patient required Total assist for orientation to time, place and situation today and SLP was unable to redirect patient to a functional task.  Continue with current plan of care.       FIM:  Comprehension Comprehension Mode: Auditory Comprehension: 2-Understands basic 25 - 49% of the time/requires cueing 51 - 75% of the time Expression Expression Mode: Verbal Expression: 2-Expresses  basic 25 - 49% of the time/requires cueing 50 - 75% of the time. Uses single words/gestures. Social Interaction Social Interaction: 2-Interacts appropriately 25 - 49% of time - Needs frequent redirection. Problem Solving Problem Solving: 1-Solves basic less than 25% of the time - needs direction nearly all the time or does not effectively solve problems and may need a restraint for safety Memory Memory: 1-Recognizes or recalls less than 25% of the time/requires cueing greater than 75% of the time FIM - Eating Eating Activity: 5: Needs verbal cues/supervision  Pain Pain Assessment Pain Assessment: No/denies pain  Therapy/Group: Individual Therapy  Carmelia Roller., CCC-SLP [phone]  [place] 04/02/2014, 4:59 PM

## 2014-04-03 ENCOUNTER — Inpatient Hospital Stay (HOSPITAL_COMMUNITY): Payer: PRIVATE HEALTH INSURANCE | Admitting: *Deleted

## 2014-04-03 NOTE — Progress Notes (Signed)
Patient ID: TERINA MCELHINNY, female   DOB: [birth year], 80 y.o.   MRN: [id number]    [place] PHYSICAL MEDICINE & REHABILITATION     PROGRESS NOTE   04/03/14.  80 y/o admit for CIR with  functional deficits secondary to SAH/right posterior communicating artery aneurysm Subjective/Complaints: Slept well last night. No issues per staff.  Sitting at [facility] A  review of systems has been performed and if not noted above is otherwise negative--limited due to cognition  Past Medical History  Diagnosis Date  . Dyslipidemia   . Cystitis 03/02/2014    acute   . UTI (urinary tract infection) 03/02/2014  . Arthritis     osteo     Objective: Vital Signs: Blood pressure 119/63, pulse 81, temperature 97.4 F (36.3 C), temperature source Oral, resp. rate 18, weight 52.7 kg (116 lb 2.9 oz), SpO2 97 %. No results found. No results for input(s): WBC, HGB, HCT, PLT in the last 72 hours. No results for input(s): NA, K, CL, GLUCOSE, BUN, CREATININE, CALCIUM in the last 72 hours.  Invalid input(s): CO CBG (last 3)  No results for input(s): GLUCAP in the last 72 hours.  Wt Readings from Last 3 Encounters:  03/24/14 52.7 kg (116 lb 2.9 oz)  03/18/14 52.4 kg (115 lb 8.3 oz)  10/11/13 57.607 kg (127 lb)   Patient Vitals for the past 24 hrs:  BP Temp Temp src Pulse Resp SpO2  04/03/14 0545 119/63 mmHg 97.4 F (36.3 C) Oral 81 18 97 %  04/02/14 1454 110/68 mmHg 97.7 F (36.5 C) Oral 86 17 99 %     Intake/Output Summary (Last 24 hours) at 04/03/14 1042 Last data filed at 04/03/14 0700  Gross per 24 hour  Intake    360 ml  Output    400 ml  Net    -40 ml    Physical Exam:  Constitutional: She appears well-developed.  HENT:  Head: Normocephalic.  Eyes: EOM are normal.  Neck: Normal range of motion. Neck supple. No thyromegaly present.  Cardiovascular: Normal rate and regular rhythm.  Respiratory: Effort normal and breath sounds normal. No respiratory distress.  GI:  Soft. Bowel sounds are normal. She exhibits no distension.  Neurological: confused, cooperative.     Skin: Skin is warm and dry. No edema   Medical Problem List and Plan: 1. Functional deficits secondary to right-sided SAH/right posterior communicating artery aneurysm status post coiling 03/05/2014 with communicating hydrocephalus status post VP shunt 03/15/2014  2. DVT Prophylaxis/Anticoagulation: SCDs.   3. Pain Management: Tylenol as needed 4. Hyperlipidemia. Zocor 5. Neuropsych: This patient is not capable of making decisions on her own behalf.   -continue ritalin as above to improve day time arousal, focus/attention  -increased trazodone.  -re-ordered urine culture having completed abx 6. Skin/Wound Care: Routine skin checks 7. Fluids/Electrolytes/Nutrition: chemistries ok  -encourage adequate po 8. Vasospasm---nimotop completed    LOS (Days) 16 A FACE TO FACE EVALUATION WAS PERFORMED  Nyoka Cowden 04/03/2014 10:40 AM

## 2014-04-03 NOTE — Progress Notes (Signed)
Physical Therapy Session Note  Patient Details  Name: Sharon [name] MRN: [id number] Date of Birth: [birth year]  Today's Date: 04/03/2014 PT Individual Time: 1330-1400 PT Individual Time Calculation (min): 30 min  and Today's Date: 04/03/2014 PT Concurrent Time: 1400-1430 PT Concurrent Time Calculation (min): 30 min  Short Term Goals: Week 2:  PT Short Term Goal 1 (Week 2): Pt to perform functional transfers with close(S), 50% of time PT Short Term Goal 2 (Week 2): Pt will ambulate 200' with close(S), 50% of time PT Short Term Goal 3 (Week 2): Pt to maintain dynamic standing balance during functional tasks with close(S), 50% of time PT Short Term Goal 4 (Week 2): Pt will demonstrate sustained attention to functional task up to 39min with max cues PT Short Term Goal 5 (Week 2): Pt will attend to objects on L side during funcitonal tasks with mod cues  Skilled Therapeutic Interventions/Progress Updates:  Upon arrival, spouse asked PT to return after pt finished eating. Pt needed mod A to participate upon return. PT tx included functional mobility training, cognitive remediation, and functional balance tasks.   Pt had several episodes of laying on furniture and mat, needing max A to return to task. Overall, pt required mod cues for sustained and selective attention to tasks, including multi-modal cues. Pt challenged with locating list of items commonly found in kitchen, especially when items on L side of environment. Pt folded and hung linens on line, needing min cues for technique and continuation of task in timely manner.  Pt performed static and dynamic balance tasks 3x65min and 1x8 min with up to Min A for L LOB and inattention during functional tasks and ball toss/catch activity. Functional ambulation in controlled and home settings 2x150' and multiple short distances with min A on L.   Pt left up in recliner with lap belt and spouse present, all needs in reach.        Therapy  Documentation Precautions:  Precautions Precautions: Fall Precaution Comments: can be impulsive at times Restrictions Weight Bearing Restrictions: No General:   Vital Signs:   Pain: none    See FIM for current functional status  Therapy/Group: Individual Therapy  Kennieth Rad, PT, DPT   Dwaine Deter, [place] 04/03/2014, 2:56 PM

## 2014-04-04 ENCOUNTER — Inpatient Hospital Stay (HOSPITAL_COMMUNITY): Payer: Medicare Other | Admitting: *Deleted

## 2014-04-04 NOTE — Progress Notes (Signed)
Patient ID: Sharon [name], female   DOB: 1934-11-24, 80 y.o.   MRN: [id number]   Patient ID: Sharon [name], female   DOB: 07-13-1934, 80 y.o.   MRN: [id number]    [facility]     PROGRESS NOTE   04/04/14.  80 y/o admit for CIR with  functional deficits secondary to SAH/right posterior communicating artery aneurysm Subjective/Complaints:  Slept well last night. No issues per staff.   A  review of systems has been performed and if not noted above is otherwise negative--limited due to cognition  Past Medical History  Diagnosis Date  . Dyslipidemia   . Cystitis 03/02/2014    acute   . UTI (urinary tract infection) 03/02/2014  . Arthritis     osteo     Objective: Vital Signs: Blood pressure 114/59, pulse 77, temperature 98.2 F (36.8 C), temperature source Axillary, resp. rate 18, weight 52.7 kg (116 lb 2.9 oz), SpO2 95 %. No results found. No results for input(s): WBC, HGB, HCT, PLT in the last 72 hours. No results for input(s): NA, K, CL, GLUCOSE, BUN, CREATININE, CALCIUM in the last 72 hours.  Invalid input(s): CO CBG (last 3)  No results for input(s): GLUCAP in the last 72 hours.  Wt Readings from Last 3 Encounters:  03/24/14 52.7 kg (116 lb 2.9 oz)  03/18/14 52.4 kg (115 lb 8.3 oz)  10/11/13 57.607 kg (127 lb)   Patient Vitals for the past 24 hrs:  BP Temp Temp src Pulse Resp SpO2  04/04/14 0552 (!) 114/59 mmHg 98.2 F (36.8 C) Axillary 77 18 95 %  04/03/14 1535 (!) 103/56 mmHg 98.4 F (36.9 C) Oral 91 17 -     Intake/Output Summary (Last 24 hours) at 04/04/14 7517 Last data filed at 04/03/14 1800  Gross per 24 hour  Intake    480 ml  Output      0 ml  Net    480 ml    Physical Exam:  Constitutional: She appears well-developed. Sleepy this am but easlily arousable HENT:  Head: Normocephalic.  Eyes: EOM are normal.  Neck: Normal range of motion. Neck supple. No thyromegaly present.  Cardiovascular: Normal rate  and regular rhythm.  Respiratory: Effort normal and breath sounds normal. No respiratory distress.  GI: Soft. Bowel sounds are normal. She exhibits no distension.  Neurological: confused, cooperative.     Skin: Skin is warm and dry. No edema   Medical Problem List and Plan: 1. Functional deficits secondary to right-sided SAH/right posterior communicating artery aneurysm status post coiling 03/05/2014 with communicating hydrocephalus status post VP shunt 03/15/2014  2. DVT Prophylaxis/Anticoagulation: SCDs.   3. Pain Management: Tylenol as needed 4. Hyperlipidemia. Zocor 5. Neuropsych: This patient is not capable of making decisions on her own behalf.   -continue ritalin as above to improve day time arousal, focus/attention  -increased trazodone.  -re-ordered urine culture having completed abx 6. Skin/Wound Care: Routine skin checks 7. Fluids/Electrolytes/Nutrition: chemistries ok  -encourage adequate po 8. Vasospasm---nimotop completed    LOS (Days) 17 A FACE TO FACE EVALUATION WAS PERFORMED  Nyoka Cowden 04/04/2014 9:21 AM

## 2014-04-04 NOTE — Discharge Summary (Signed)
NAME:  Sharon [name], Sharon [name] NO.:  [id number]  MEDICAL RECORD NO.:  [id number]  LOCATION:  [id number]                        FACILITY:  [facility]  PHYSICIAN:  Meredith Staggers, M.D.DATE OF BIRTH:  [birth year]  DATE OF ADMISSION:  03/18/2014 DATE OF DISCHARGE:  04/05/2014                              DISCHARGE SUMMARY   DISCHARGE DIAGNOSES: 1. Functional deficits secondary to right-sided subarachnoid     hemorrhage with right posterior communicating artery aneurysm     status post coiling with communicating hydrocephalus status post VP     shunt on March 15, 2014. 2. Sequential compression devices for DVT prophylaxis. 3. Pain management. 4. Hyperlipidemia. 5. Pseudomonas urinary tract infection, resolved. 6. Question herpes simplex virus-1 sores right back and hip with     Zovirax ointment and routine skin care.  HISTORY OF PRESENT ILLNESS:  This is a 80 year old right-handed female with history of hyperlipidemia, tobacco abuse who was admitted on March 02, 2014, with altered mental status.  The patient was independent prior to admission, living with her significant other.  By report, she had been experiencing headaches, weakness while at a recent dinner party.  Initial cranial CT scan negative.  MRI showed focal diffusion signal abnormality within the right sylvian fissure.  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  Lumbar puncture completed showing significant subarachnoid blood.  Interventional Radiology consulted, cerebral arteriogram completed, she underwent coiling of aneurysm on March 05, 2014.  She was maintained on mechanical ventilation for a short time.  Followup cranial CT scan showed no visible infarct or acute hemorrhage after coiling.  She was extubated on March 06, 2014.  Again followup serial cranial CT scan showed increasing communicating hydrocephalus with the patient having  episodes of increasing confusion and incontinence.  Followup Neurosurgery, she underwent right occipital VP shunt on March 15, 2014, per Dr. Annette Stable.  Noted low-grade fevers, maintained on broad-spectrum antibiotics, later discontinued.  Decadron protocol as directed.  Nimotop protocol for monitoring of blood pressure and any vasospasms.  She was on a mechanical soft diet.  Physical and Occupational Therapy ongoing.  The patient was admitted for comprehensive rehab program.  PAST MEDICAL HISTORY:  See discharge diagnoses.  SOCIAL HISTORY:  She lives with significant other.  Independent prior to admission.  FUNCTIONAL STATUS:  Upon admission to rehab service was min to mod assist, functional mobility sit to stand ambulation, min to mod assist activities of daily living.  PHYSICAL EXAMINATION:  VITAL SIGNS:  Blood pressure 125/56, pulse 73, temperature 97, respirations 19. GENERAL:  This was an alert female, she is oriented to name, age, date of birth, followed simple commands.  She would confabulate during the exam.  Mild left-sided dysmetria and ataxia. LUNGS:  Clear to auscultation. CARDIAC:  Regular rate and rhythm. ABDOMEN:  Soft, nontender.  Good bowel sounds.  REHABILITATION HOSPITAL COURSE:  The patient was admitted to Inpatient Rehab Services with therapies initiated on a 3-hour daily basis consisting of physical therapy, occupational therapy, speech therapy, and rehabilitation nursing.  The following issues were addressed during the patient's rehabilitation stay.  Pertaining to Ms. Espejo's SAH with posterior communicating artery aneurysm, she underwent  coiling as well as VP shunt followed by Dr. Annette Stable of Neurosurgery.  She continued to participate with therapies, monitored closely.  Latest followup cranial CT scan on March 29, 2014, showed ventricular size similar to the March 20, 2014, exam, minimally more prominent than the March 16, 2014, although significantly  improved when compared to March 10, 2014. Previously noted residual tiny amount of blood within the right sylvian fissure had cleared.  No new intracranial abnormalities were identified. She remained with sequential compression devices for DVT prophylaxis. Her blood pressures were well controlled and monitored.  She had completed her protocol of Nimotop.  She continued on a mechanical soft diet.  Ongoing bouts of being confused, intermittently distracted.  She was placed on Ritalin 10 mg b.i.d. as well as Desyrel 50 mg at bedtime to help improve anytime arousal, focal attention, all issues in regards to her mental status, insomnia continued to be discussed fully with family.  She did continue to improve in this area followed by Speech Therapy.  She was restless at times during her exams.  Did much better when she had visitors that she could recognize.  She continued to require total assist for orientation to time, place, and situation during her therapy sessions as well as ongoing assistance for problem solving.  She had 2 small areas on her right back and hip, question HSV- 1, placed on Zovirax ointment with good results with close monitoring of routine skin care.  The patient received weekly collaborative interdisciplinary team conferences to discuss estimated length of stay, family teaching, any barriers to her discharge.  Overall she required moderate assist for sustained and selective attention to tasks as well as attaining to the left side of the environment.  She performed static and dynamic balance testing at minimal assistance, ambulating in a controlled environment 150 feet x2 minimal assistance.  At times, she would refuse and decline any activities of daily living.  Required steady assist for standing up to pull up her pants.  The patient with progressive gains during her rehab stay, however, it was felt by family that skilled nursing facility was needed.  Bed made available  at [facility] on April 05, 2014, and discharge taking place at that time.  DISCHARGE MEDICATIONS: 1. Zovirax ointment for 5 days applied to right buttocks and low back,     dry dressing after application of ointment, to be completed on     April 07, 2014. 2. Pepcid 20 mg p.o. daily. 3. Ritalin 10 mg p.o. b.i.d. at 7 a.m. and 12 noon. 4. Zocor 20 mg p.o. daily. 5. Desyrel 50 mg p.o. at bedtime.  DIET:  Mechanical soft with thin liquids.  FOLLOWUP:  She would follow up with Dr. Alger Simons at the outpatient rehab service office as directed on 05/19/2014 on arrival 11:20 AM, Dr. Kelton Pillar, medical management, Dr. Earnie Larsson, Neurosurgery.     Lauraine Rinne, P.A.   ______________________________ Meredith Staggers, M.D.    DA/MEDQ  D:  04/04/2014  T:  04/04/2014  Job:  [id number]  cc:   Mallie Mussel A. Pool, M.D. Meredith Staggers, M.D. [place]ford Cage Laurann Montana, M.D.

## 2014-04-04 NOTE — Progress Notes (Signed)
Occupational Therapy Session Note  Patient Details  Name: Sharon [name] MRN: [id number] Date of Birth: [birth year]  Today's Date: 04/04/2014 OT Individual Time:  -   1500-1545  (45 min)      Short Term Goals: Week 1:  OT Short Term Goal 1 (Week 1): Pt. will maintain standing balance during bathing with SBA OT Short Term Goal 1 - Progress (Week 1): Met OT Short Term Goal 2 (Week 1): Pt will gather clothes with SBA for balance and minimal cues for organization. OT Short Term Goal 2 - Progress (Week 1): Progressing toward goal OT Short Term Goal 3 (Week 1): Pt will demonstrate sustained attention for 10 minutes in bathing session OT Short Term Goal 3 - Progress (Week 1): Progressing toward goal OT Short Term Goal 4 (Week 1): Pt. will transfer to toilet with SBA OT Short Term Goal 4 - Progress (Week 1): Met OT Short Term Goal 5 (Week 1): Pt. will demonstrate intellectual awareness of deficits with minimal verabal cues.  OT Short Term Goal 5 - Progress (Week 1): Progressing toward goal  Skilled Therapeutic Interventions/Progress Updates:    Engaged in cognitive retraining, visual scanning, and problem solving.  Pt asked to find various pages in the newspaper. Needed max cues to find pages as well as help with UE AROM to arrange papers.  .  Pt exhhited sustained attention for 1 minute and then would lay head on pillow.  Pt. Answered 4 clues to  Crossword puzzle which she was able to find in newspaper after increased time and max cues.   Pt. Rested head on pillow more and more as session progressed.  Returned pt to nursing station.    Therapy Documentation Precautions:  Precautions Precautions: Fall Precaution Comments: can be impulsive at times Restrictions Weight Bearing Restrictions: No   Pain:  none           See FIM for current functional status  Therapy/Group: Individual Therapy  Lisa Roca 04/04/2014, 3:44 PM

## 2014-04-05 ENCOUNTER — Inpatient Hospital Stay (HOSPITAL_COMMUNITY): Payer: PRIVATE HEALTH INSURANCE

## 2014-04-05 ENCOUNTER — Encounter (HOSPITAL_COMMUNITY): Payer: PRIVATE HEALTH INSURANCE

## 2014-04-05 ENCOUNTER — Inpatient Hospital Stay (HOSPITAL_COMMUNITY): Payer: PRIVATE HEALTH INSURANCE | Admitting: Speech Pathology

## 2014-04-05 DIAGNOSIS — Z982 Presence of cerebrospinal fluid drainage device: Secondary | ICD-10-CM | POA: Diagnosis not present

## 2014-04-05 DIAGNOSIS — R2681 Unsteadiness on feet: Secondary | ICD-10-CM | POA: Diagnosis not present

## 2014-04-05 DIAGNOSIS — I609 Nontraumatic subarachnoid hemorrhage, unspecified: Secondary | ICD-10-CM | POA: Diagnosis not present

## 2014-04-05 DIAGNOSIS — G4700 Insomnia, unspecified: Secondary | ICD-10-CM | POA: Diagnosis not present

## 2014-04-05 DIAGNOSIS — E785 Hyperlipidemia, unspecified: Secondary | ICD-10-CM | POA: Diagnosis not present

## 2014-04-05 DIAGNOSIS — I6031 Nontraumatic subarachnoid hemorrhage from right posterior communicating artery: Secondary | ICD-10-CM | POA: Diagnosis not present

## 2014-04-05 DIAGNOSIS — R262 Difficulty in walking, not elsewhere classified: Secondary | ICD-10-CM | POA: Diagnosis not present

## 2014-04-05 DIAGNOSIS — I10 Essential (primary) hypertension: Secondary | ICD-10-CM | POA: Diagnosis not present

## 2014-04-05 DIAGNOSIS — M6281 Muscle weakness (generalized): Secondary | ICD-10-CM | POA: Diagnosis not present

## 2014-04-05 DIAGNOSIS — R410 Disorientation, unspecified: Secondary | ICD-10-CM | POA: Diagnosis not present

## 2014-04-05 DIAGNOSIS — F05 Delirium due to known physiological condition: Secondary | ICD-10-CM | POA: Diagnosis not present

## 2014-04-05 DIAGNOSIS — F329 Major depressive disorder, single episode, unspecified: Secondary | ICD-10-CM | POA: Diagnosis not present

## 2014-04-05 DIAGNOSIS — G919 Hydrocephalus, unspecified: Secondary | ICD-10-CM | POA: Diagnosis not present

## 2014-04-05 DIAGNOSIS — K219 Gastro-esophageal reflux disease without esophagitis: Secondary | ICD-10-CM | POA: Diagnosis not present

## 2014-04-05 DIAGNOSIS — F419 Anxiety disorder, unspecified: Secondary | ICD-10-CM | POA: Diagnosis not present

## 2014-04-05 DIAGNOSIS — Z9889 Other specified postprocedural states: Secondary | ICD-10-CM | POA: Diagnosis not present

## 2014-04-05 DIAGNOSIS — N390 Urinary tract infection, site not specified: Secondary | ICD-10-CM | POA: Diagnosis not present

## 2014-04-05 DIAGNOSIS — R278 Other lack of coordination: Secondary | ICD-10-CM | POA: Diagnosis not present

## 2014-04-05 DIAGNOSIS — B009 Herpesviral infection, unspecified: Secondary | ICD-10-CM | POA: Diagnosis not present

## 2014-04-05 LAB — URINE CULTURE: Colony Count: 30000

## 2014-04-05 NOTE — Progress Notes (Signed)
Speech Language Pathology Daily Session Note  Patient Details  Name: Sharon [name] MRN: [id number] Date of Birth: 08-04-1934  Today's Date: 04/05/2014 SLP Individual Time: 1000-1100 SLP Individual Time Calculation (min): 60 min  Short Term Goals: Week 3: SLP Short Term Goal 1 (Week 3): Patient will demonstrate sustained attention to a functional task for 1 minute with Max A multimodal cues. SLP Short Term Goal 2 (Week 3): Patient will identify 1 cognitive deficit with Mod A multimodal cues.  SLP Short Term Goal 3 (Week 3): Patient will demonstrate basic problem solving for functional, familiar tasks with Mod A multimodal cues with 50% of opportunities.  SLP Short Term Goal 4 (Week 3): Patient will utilize the call bell to request assistance with Max A multimodal cues with 25% of opportunities SLP Short Term Goal 5 (Week 3): Patient will utilize small bites and a slow rate of self-feeding to minimize aspiration risk with current diet with Mod A multimodal cues.  SLP Short Term Goal 6 (Week 3): Patient will utilize external memory aids to orient to date and situation with Mod A multimodal cues.   Skilled Therapeutic Interventions: Skilled treatment session focused on cognitive goals. Student facilitated session by providing Max A verbal cues for working memory and Mod A multimodal cues to sustained attention for up to 90 seconds at a time during functional cognitive task. Patient required Max A in the form of verbal cues, repetitions of test stimuli, and extra time to complete working memory task. After given Mod A question cues, patient demonstrated emerging intellectual awareness of memory and attention deficits by stating that she has more difficulty than she used to in those areas. During a functional conversation with student, patient was able to name basic biographical information with Mod A multimodal cues, demonstrating improvement in retrieval/recall.  Student also facilitated session by  providing Max A multimodal cues during a money-counting task requiring simple addition and subtraction. Patient required Mod A multimodal cues for orientation to time, place, and situation. Patient left in recliner in room with quick-release belt on and family members present. Continue with current plan of care.   FIM:  Comprehension Comprehension Mode: Auditory Comprehension: 3-Understands basic 50 - 74% of the time/requires cueing 25 - 50%  of the time Expression Expression Mode: Verbal Expression: 3-Expresses basic 50 - 74% of the time/requires cueing 25 - 50% of the time. Needs to repeat parts of sentences. Social Interaction Social Interaction: 3-Interacts appropriately 50 - 74% of the time - May be physically or verbally inappropriate. Problem Solving Problem Solving: 2-Solves basic 25 - 49% of the time - needs direction more than half the time to initiate, plan or complete simple activities Memory Memory: 2-Recognizes or recalls 25 - 49% of the time/requires cueing 51 - 75% of the time  Pain Pain Assessment Pain Assessment: No/denies pain  Therapy/Group: Individual Therapy  Servando Snare 04/05/2014, 12:53 PM

## 2014-04-05 NOTE — Progress Notes (Signed)
Pt discharged to [facility] at 72 with daughter. Belongings with pt and paperwork with daughter. Report given to RN [facility].

## 2014-04-05 NOTE — Progress Notes (Signed)
Physical Therapy Discharge Summary  Patient Details  Name: Sharon [name] MRN: [id number] Date of Birth: 07/11/1934  Today's Date: 04/05/2014 PT Individual Time: 0800-0840 PT Individual Time Calculation (min): 40 min  Patient has met 12 of 12 long term goals due to improved activity tolerance, improved balance, improved postural control, improved attention and improved awareness.  Patient to discharge at an ambulatory level Min Assist.   Pt to discharge to SNF as pt's family unable to provide the necessary physical and cognitive assistance recommended at discharge.  Reasons goals not met: N/A  Recommendation:  Patient will benefit from ongoing skilled PT services in skilled nursing facility setting to continue to advance safe functional mobility, address ongoing decreased orientation, decreased sustained attention, decreased intellectual awareness, decreased memory, decreased problem solving, decreased initiation, decreased balance, decreased strength, and minimize fall risk.  Equipment: None  Reasons for discharge: treatment goals met and discharge from hospital  Patient/family agrees with progress made and goals achieved: Yes  Skilled Therapeutic Intervention 1:1. Pt received sitting in geri chair at nurses station, ready for physical therapy. Pt oriented to self and place, but req max external cues for time and situation. Focus this session on functional endurance, safe functional transfers and mobility as well as formal reassessment of balance. Pt req overall supervision for bed mobility in a standard bed and close(S)-min A for multiple t/f sit<>stand, floor transfer, ambulation >250' with intermittent HHA, standing balance as well as min A for negotiation up/down 12 steps with single R rail and encouraged step-to pattern.   Berg Balance Test reassessed, pt demonstrating significant improvement in score from 29 to 37/56 but continues to indicate that pt remains at high risk for falls.  See detailed objective information below. Pt educated on results, verbalized understanding.   Pt req overall mod-max cues for sustained attention to functional tasks throughout session.  Pt req intermittent short seated or supine rest breaks due to verbalized fatigue, but min cues for redirection to functional tasks.   PT Discharge Precautions/Restrictions   Vital Signs Therapy Vitals Temp: 98.6 F (37 C) Temp Source: Oral Pulse Rate: 84 Resp: 18 BP: 111/60 mmHg Patient Position (if appropriate): Lying Oxygen Therapy SpO2: 99 % O2 Device: Not Delivered Pain Pain Assessment Pain Assessment: No/denies pain Vision/Perception  See OT discharge Cognition Overall Cognitive Status: Impaired/Different from baseline Arousal/Alertness: Awake/alert Orientation Level: Oriented to person;Oriented to situation;Disoriented to place;Disoriented to time Attention: Sustained Sustained Attention: Impaired Sustained Attention Impairment: Verbal basic;Functional basic Memory: Impaired Memory Impairment: Decreased recall of new information;Decreased short term memory;Retrieval deficit;Decreased long term memory;Storage deficit;Prospective memory Decreased Long Term Memory: Verbal basic Decreased Short Term Memory: Verbal basic;Functional basic Awareness: Impaired Awareness Impairment: Intellectual impairment Problem Solving: Impaired Problem Solving Impairment: Verbal basic;Functional basic Executive Function:  (all impaired due to lower level deficits) Reasoning: Impaired Reasoning Impairment: Verbal basic;Functional basic Sequencing: Impaired Sequencing Impairment: Verbal basic Organizing: Impaired Organizing Impairment: Functional basic Decision Making: Impaired Decision Making Impairment: Verbal basic;Functional basic Initiating: Impaired Initiating Impairment: Verbal basic;Functional basic Self Monitoring: Impaired Self Monitoring Impairment: Verbal basic;Functional basic Self  Correcting: Impaired Self Correcting Impairment: Verbal basic;Functional basic Behaviors: Restless;Perseveration;Impulsive Safety/Judgment: Impaired Sensation Sensation Light Touch: Appears Intact Hot/Cold: Appears Intact Proprioception: Appears Intact Coordination Gross Motor Movements are Fluid and Coordinated: Yes Fine Motor Movements are Fluid and Coordinated: Yes Motor  Motor Motor: Other (comment) Motor - Discharge Observations: generalized weakness  Mobility Bed Mobility Bed Mobility: Sit to Supine;Supine to Sit Supine to Sit: 5: Supervision Supine to Sit Details:  Verbal cues for precautions/safety Sit to Supine: 5: Supervision Sit to Supine - Details: Verbal cues for precautions/safety Transfers Transfers: Yes Sit to Stand: 4: Min guard;4: Min assist;With armrests Sit to Stand Details: Verbal cues for precautions/safety;Manual facilitation for weight shifting Stand to Sit: 4: Min guard;4: Min assist Stand to Sit Details (indicate cue type and reason): Verbal cues for precautions/safety Stand Pivot Transfers: 4: Min guard;4: Min assist Stand Pivot Transfer Details: Manual facilitation for weight shifting Locomotion  Ambulation Ambulation: Yes Ambulation/Gait Assistance: 4: Min guard;5: Supervision;4: Min assist Ambulation Distance (Feet): 250 Feet Assistive device: None Ambulation/Gait Assistance Details: Manual facilitation for weight shifting;Verbal cues for precautions/safety Ambulation/Gait Assistance Details: Demonstrates variable L lean Gait Gait: Yes Gait Pattern: Impaired Gait Pattern: Lateral trunk lean to left;Trunk flexed;Step-through pattern;Decreased step length - right;Decreased step length - left;Decreased trunk rotation Gait velocity: decreased Stairs / Additional Locomotion Stairs: Yes Stairs Assistance: 4: Min assist Stairs Assistance Details: Verbal cues for precautions/safety;Verbal cues for gait pattern Stair Management Technique: One rail  Right;Step to pattern;Forwards Number of Stairs: 12 Wheelchair Mobility Wheelchair Mobility: No (Pt at functional ambulatory level)  Trunk/Postural Assessment  Cervical Assessment Cervical Assessment: Within Functional Limits Thoracic Assessment Thoracic Assessment: Within Functional Limits Lumbar Assessment Lumbar Assessment: Within Functional Limits Postural Control Postural Control: Deficits on evaluation Righting Reactions: decreased in standing, however, improved since initial eval  Balance Balance Balance Assessed: Yes Standardized Balance Assessment Standardized Balance Assessment: Berg Balance Test Berg Balance Test Sit to Stand: Able to stand  independently using hands Standing Unsupported: Able to stand 2 minutes with supervision Sitting with Back Unsupported but Feet Supported on Floor or Stool: Able to sit 2 minutes under supervision Stand to Sit: Sits safely with minimal use of hands Transfers: Able to transfer safely, minor use of hands Standing Unsupported with Eyes Closed: Able to stand 10 seconds with supervision Standing Ubsupported with Feet Together: Able to place feet together independently and stand for 1 minute with supervision From Standing, Reach Forward with Outstretched Arm: Can reach forward >12 cm safely (5") From Standing Position, Pick up Object from Floor: Able to pick up shoe, needs supervision From Standing Position, Turn to Look Behind Over each Shoulder: Looks behind one side only/other side shows less weight shift Turn 360 Degrees: Needs close supervision or verbal cueing Standing Unsupported, Alternately Place Feet on Step/Stool: Able to complete >2 steps/needs minimal assist Standing Unsupported, One Foot in Front: Able to take small step independently and hold 30 seconds Standing on One Leg: Tries to lift leg/unable to hold 3 seconds but remains standing independently Total Score: 37 Static Sitting Balance Static Sitting - Balance Support:  Feet supported Static Sitting - Level of Assistance: 6: Modified independent (Device/Increase time) Dynamic Sitting Balance Dynamic Sitting - Balance Support: Feet supported;During functional activity Dynamic Sitting - Level of Assistance: 5: Stand by assistance Dynamic Sitting - Balance Activities: Lateral lean/weight shifting;Reaching for objects;Reaching across midline;Forward lean/weight shifting Static Standing Balance Static Standing - Balance Support: No upper extremity supported Static Standing - Level of Assistance: 4: Min assist;5: Stand by assistance Dynamic Standing Balance Dynamic Standing - Balance Support: No upper extremity supported Dynamic Standing - Level of Assistance: 4: Min assist Dynamic Standing - Balance Activities: Reaching for objects;Reaching across midline;Forward lean/weight shifting;Lateral lean/weight shifting Extremity Assessment  RUE Assessment RUE Assessment: Within Functional Limits LUE Assessment LUE Assessment: Within Functional Limits RLE Assessment RLE Assessment: Within Functional Limits RLE Strength RLE Overall Strength Comments: Hip flexion: 4/5; knee flex/ext and DF/PF: 4+/5  LLE Assessment LLE Assessment: Within Functional Limits LLE Strength LLE Overall Strength Comments: Hip flexion: 4/5; knee flex/ext and DF/PF: 4+/5  See FIM for current functional status  [place], [place] 04/05/2014, 5:45 PM

## 2014-04-05 NOTE — Progress Notes (Signed)
Occupational Therapy Discharge Summary  Patient Details  Name: Sharon [name] MRN: [id number] Date of Birth: Jul 11, 1934     Patient has met 11 of 12 long term goals due to improved activity tolerance, improved balance, ability to compensate for deficits, improved attention and improved coordination.  Pt's progress has been minimal with BADLs during this admission.  Pt continues to require steady A for LB dressing and toileting tasks.  Pt requires min to max verbal cues for task initiation and sequencing.  Pt is consistently oriented to person and situation but requires max A to tot A for orientation to place and date.  Pt's family has not been present for therapy sessions. Patient to discharge at [facility] Assist level. Pt discharging to SNF for continued OT services to address functional needs.  Reasons goals not met: Pt requires Max A for awareness at this time.   Recommendation:  Patient will benefit from ongoing skilled OT services in skilled nursing facility setting to continue to advance functional skills in the area of BADL.  Equipment: No equipment provided Discharge to SNF  Reasons for discharge: treatment goals met and discharge from hospital  Patient/family agrees with progress made and goals achieved: Yes  OT Discharge Vision/Perception  Vision- History Baseline Vision/History: Wears glasses Wears Glasses: Reading only Patient Visual Report: No change from baseline Vision- Assessment Eye Alignment: Within Functional Limits  Cognition Overall Cognitive Status: Impaired/Different from baseline Arousal/Alertness: Awake/alert Orientation Level: Oriented to person;Oriented to situation;Disoriented to time;Disoriented to place Attention: Sustained Sustained Attention: Impaired Sustained Attention Impairment: Verbal basic;Functional basic Memory: Impaired Memory Impairment: Decreased recall of new information;Decreased short term memory;Decreased long term  memory Decreased Long Term Memory: Verbal basic Decreased Short Term Memory: Verbal basic;Functional basic Awareness: Impaired Awareness Impairment: Intellectual impairment Problem Solving: Impaired Problem Solving Impairment: Verbal basic;Functional basic Reasoning: Impaired Reasoning Impairment: Verbal basic;Functional basic Sequencing: Impaired Organizing Impairment: Functional basic Decision Making: Impaired Decision Making Impairment: Verbal basic;Functional basic Initiating: Impaired Initiating Impairment: Functional basic Self Monitoring: Impaired Self Monitoring Impairment: Verbal basic;Functional basic Self Correcting: Impaired Self Correcting Impairment: Verbal basic;Functional basic Behaviors: Restless;Impulsive;Perseveration Safety/Judgment: Impaired Sensation Sensation Light Touch: Appears Intact Hot/Cold: Appears Intact Proprioception: Appears Intact Coordination Gross Motor Movements are Fluid and Coordinated: Yes Fine Motor Movements are Fluid and Coordinated: Yes  Trunk/Postural Assessment  Cervical Assessment Cervical Assessment: Within Functional Limits Thoracic Assessment Thoracic Assessment: Within Functional Limits Lumbar Assessment Lumbar Assessment: Within Functional Limits  Balance Balance Balance Assessed: Yes Standardized Balance Assessment Standardized Balance Assessment: Berg Balance Test Static Sitting Balance Static Sitting - Balance Support: Feet supported Static Sitting - Level of Assistance: 6: Modified independent (Device/Increase time) Dynamic Sitting Balance Dynamic Sitting - Balance Support: Feet supported;During functional activity Dynamic Sitting - Level of Assistance: 5: Stand by assistance Extremity/Trunk Assessment RUE Assessment RUE Assessment: Within Functional Limits LUE Assessment LUE Assessment: Within Functional Limits  See FIM for current functional status  Leroy Libman 04/05/2014, 8:32 AM

## 2014-04-05 NOTE — Progress Notes (Signed)
Social Work  Discharge Note  The overall goal for the admission was met for:   Discharge location: No - plan changed to SNF  Length of Stay: No - extended slightly due to SNF bed search  Discharge activity level: Yes - supervision/ min assist  Home/community participation: Yes  Services provided included: MD, RD, PT, OT, SLP, RN, TR, Pharmacy, Neuropsych and SW  Financial Services: Medicare  Follow-up services arranged: Other: SNF at [facility] (or additional information):  Patient/Family verbalized understanding of follow-up arrangements: Yes  Individual responsible for coordination of the follow-up plan: daughter, Maudie Mercury  Confirmed correct DME delivered: NA    Baeleigh Devincent

## 2014-04-05 NOTE — Progress Notes (Signed)
Speech Language Pathology Discharge Summary  Patient Details  Name: NELISSA BOLDUC MRN: [id number] Date of Birth: 1934/04/03   Patient has met 5 of 5 long term goals.  Patient to discharge at overall Max level.   Reasons goals not met:   n/a  Clinical Impression/Discharge Summary: Patient has met 5 out of 5 LTG's this admission due to increased retrieval/recall, working memory, sustained attention, orientation, and problem solving. Currently patient is consuming Dys. 3 textures with thin liquids with minimal overt s/s of aspiration with Supervision A for use of swallow strategies. Patient requires Mod A multimodal cues for auditory comprehension, verbal expression, and social interaction, and requires Max A multimodal cues for problem solving, memory, attention, intellectual awareness, and safety awareness. Patient's family is unable to provide the necessary assistance needed at this time, therefore, patient will discharge to a SNF. Patient would benefit from skilled SLP intervention at the next venue of care to maximize cognitive functioning and reduce caregiver burden.   Care Partner:  Caregiver Able to Provide Assistance: No;Other (comment) (family unable to provide 24hr supervision at this time)  Type of Caregiver Assistance: Cognitive;Physical  Recommendation:  Skilled Nursing facility;24 hour supervision/assistance  Rationale for SLP Follow Up: Maximize functional communication;Maximize cognitive function and independence;Reduce caregiver burden;Maximize swallowing safety   Equipment: n/a   Reasons for discharge: Discharged from hospital   Patient/Family Agrees with Progress Made and Goals Achieved: Yes   See FIM for current functional status  Servando Snare 04/05/2014, 3:52 PM

## 2014-04-05 NOTE — Progress Notes (Signed)
[facility] PHYSICAL [facility]ICINE & REHABILITATION     PROGRESS NOTE    Subjective/Complaints: Slept well. Woke up to go the BR this morning and now without complaints A  review of systems has been performed and if not noted above is otherwise negative--limited due to cognition   Objective: Vital Signs: Blood pressure 111/60, pulse 84, temperature 98.6 F (37 C), temperature source Oral, resp. rate 18, weight 52.7 kg (116 lb 2.9 oz), SpO2 99 %. No results found. No results for input(s): WBC, HGB, HCT, PLT in the last 72 hours. No results for input(s): NA, K, CL, GLUCOSE, BUN, CREATININE, CALCIUM in the last 72 hours.  Invalid input(s): CO CBG (last 3)  No results for input(s): GLUCAP in the last 72 hours.  Wt Readings from Last 3 Encounters:  03/24/14 52.7 kg (116 lb 2.9 oz)  03/18/14 52.4 kg (115 lb 8.3 oz)  10/11/13 57.607 kg (127 lb)    Physical Exam:  Constitutional: She appears well-developed.  HENT:  Head: Normocephalic.  Eyes: EOM are normal.  Neck: Normal range of motion. Neck supple. No thyromegaly present.  Cardiovascular: Normal rate and regular rhythm.  Respiratory: Effort normal and breath sounds normal. No respiratory distress.  GI: Soft. Bowel sounds are normal. She exhibits no distension.  Neurological: confused, cooperative.    RUE: 5/5 Deltoid, 5/5 Biceps, 5/5 Triceps, 5/5 Wrist Ext, 5/5 Grip LUE: 5/5 Deltoid, 5/5 Biceps, 5/5 Triceps, 5/5 Wrist Ext, 5/5 Grip RLE: HF 5/5, KE 5/5, KF 5/5, ADF 5/5, APF 5/5 LLE: HF 5/5, KE 5/5, HF, 5/5, ADF 5/5, APF 5/5   Skin: Skin is warm and dry. Two 1cm maculopapular lesions with golden crust on right hip---no drainage/no change  Assessment/Plan: 1. Functional deficits secondary to SAH/right posterior communicating artery aneurysm which require 3+ hours per day of interdisciplinary therapy in a comprehensive inpatient rehab setting. Physiatrist is providing close team supervision and 24 hour management of active  medical problems listed below. Physiatrist and rehab team continue to assess barriers to discharge/monitor patient progress toward functional and medical goals.  SNF today. Patient has made general gains in sleep-wake and is starting to show some cognitive improvement as well. Will follow up with her in the office in about 6 weeks  FIM: FIM - Bathing Bathing Steps Patient Completed: Chest, Right Arm, Left Arm, Abdomen, Front perineal area, Buttocks, Right lower leg (including foot), Left lower leg (including foot), Left upper leg, Right upper leg Bathing: 0: Activity did not occur  FIM - Upper Body Dressing/Undressing Upper body dressing/undressing steps patient completed: Hook/unhook bra, Thread/unthread right sleeve of pullover shirt/dresss, Thread/unthread left sleeve of pullover shirt/dress, Put head through opening of pull over shirt/dress, Pull shirt over trunk, Thread/unthread right bra strap, Thread/unthread left bra strap Upper body dressing/undressing: 5: Supervision: Safety issues/verbal cues FIM - Lower Body Dressing/Undressing Lower body dressing/undressing steps patient completed: Thread/unthread right underwear leg, Thread/unthread left underwear leg, Pull underwear up/down, Thread/unthread right pants leg, Thread/unthread left pants leg, Pull pants up/down, Don/Doff right sock, Don/Doff left sock, Don/Doff right shoe, Fasten/unfasten right shoe, Don/Doff left shoe, Fasten/unfasten left shoe Lower body dressing/undressing: 4: Steadying Assist  FIM - Toileting Toileting steps completed by patient: Performs perineal hygiene, Adjust clothing prior to toileting, Adjust clothing after toileting Toileting Assistive Devices: Grab bar or rail for support Toileting: 4: Steadying assist  FIM - Radio producer Devices: Grab bars Toilet Transfers: 5-From toilet/BSC: Supervision (verbal cues/safety issues), 5-To toilet/BSC: Supervision (verbal cues/safety  issues)  FIM -  Bed/Chair Financial planner Devices: Arm rests Bed/Chair Transfer: 4: Bed > Chair or W/C: Min A (steadying Pt. > 75%), 4: Chair or W/C > Bed: Min A (steadying Pt. > 75%), 5: Sit > Supine: Supervision (verbal cues/safety issues), 5: Supine > Sit: Supervision (verbal cues/safety issues)  FIM - Locomotion: Wheelchair Locomotion: Wheelchair: 0: Activity did not occur FIM - Locomotion: Ambulation Locomotion: Ambulation Assistive Devices: Other (comment) Ambulation/Gait Assistance: 4: Min guard, 5: Supervision, 4: Min assist Locomotion: Ambulation: 4: Travels 150 ft or more with minimal assistance (Pt.>75%)  Comprehension Comprehension Mode: Auditory Comprehension: 2-Understands basic 25 - 49% of the time/requires cueing 51 - 75% of the time  Expression Expression Mode: Verbal Expression: 2-Expresses basic 25 - 49% of the time/requires cueing 50 - 75% of the time. Uses single words/gestures.  Social Interaction Social Interaction: 2-Interacts appropriately 25 - 49% of time - Needs frequent redirection.  Problem Solving Problem Solving: 1-Solves basic less than 25% of the time - needs direction nearly all the time or does not effectively solve problems and may need a restraint for safety  Memory Memory: 1-Recognizes or recalls less than 25% of the time/requires cueing greater than 75% of the time  Medical Problem List and Plan: 1. 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 DVT Prophylaxis/Anticoagulation: SCDs.   3. Pain Management: Tylenol as needed 4. Hyperlipidemia. Zocor 5. Neuropsych: This patient is not capable of making decisions on her own behalf.   -continue ritalin as above to improve day time arousal, focus/attention  -increased trazodone.  -re-ordered urine culture having completed abx---negative 6. Skin/Wound Care: Routine skin checks 7. Fluids/Electrolytes/Nutrition: chemistries ok  -encourage adequate po 8. Vasospasm---nimotop completed 9. 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

## 2014-04-05 NOTE — Progress Notes (Signed)
Occupational Therapy Session Note  Patient Details  Name: Sharon [name] MRN: [id number] Date of Birth: 04/19/1934  Today's Date: 04/05/2014 OT Individual Time: 0340-3524 OT Individual Time Calculation (min): 45 min    Short Term Goals: Week 1:  OT Short Term Goal 1 (Week 1): Pt. will maintain standing balance during bathing with SBA OT Short Term Goal 1 - Progress (Week 1): Met OT Short Term Goal 2 (Week 1): Pt will gather clothes with SBA for balance and minimal cues for organization. OT Short Term Goal 2 - Progress (Week 1): Progressing toward goal OT Short Term Goal 3 (Week 1): Pt will demonstrate sustained attention for 10 minutes in bathing session OT Short Term Goal 3 - Progress (Week 1): Progressing toward goal OT Short Term Goal 4 (Week 1): Pt. will transfer to toilet with SBA OT Short Term Goal 4 - Progress (Week 1): Met OT Short Term Goal 5 (Week 1): Pt. will demonstrate intellectual awareness of deficits with minimal verabal cues.  OT Short Term Goal 5 - Progress (Week 1): Progressing toward goal Week 2:  OT Short Term Goal 1 (Week 2): Pt will gather clothing in preparation for BADLs with steady A OT Short Term Goal 2 (Week 2): Pt will demonstrate sustained attention for 10 mins during bathing session OT Short Term Goal 3 (Week 2): Pt will demonstrate intellectual awareness of deficitis with min verbal cues OT Short Term Goal 4 (Week 2): Pt will initiate bathing tasks with min multimodal cues  Skilled Therapeutic Interventions/Progress Updates:    Pt engaged in BADL retraining including bathing at shower level, toileting, and dressing with sit<>stand from chair.  Pt amb in room ([facility]) to gather clothing prior to entering bathroom.  Pt requested to use toilet before shower.  Pt required steady A for toileting and when standing in shower.  Pt initiated all bathing tasks this morning when presented with bathing items.  Pt donned robe before exiting bathroom.  Pt asked if she should  wear a brassiere this morning.  Pt initiated donning all clothing items, including bra, when presented with clothing.  Pt required steady A when standing to pull up pants and when standing at sink to brush hair and teeth.  Pt oriented to person and situation.  Pt required tot A for orientation to place and date.     Therapy Documentation Precautions:  Precautions Precautions: Fall Precaution Comments: can be impulsive at times Restrictions Weight Bearing Restrictions: No   Pain:  Pt denies pain  See FIM for current functional status  Therapy/Group: Individual Therapy  Leroy Libman 04/05/2014, 9:45 AM

## 2014-04-06 ENCOUNTER — Encounter: Payer: Self-pay | Admitting: Adult Health

## 2014-04-06 ENCOUNTER — Non-Acute Institutional Stay (SKILLED_NURSING_FACILITY): Payer: Medicare Other | Admitting: Adult Health

## 2014-04-06 DIAGNOSIS — I609 Nontraumatic subarachnoid hemorrhage, unspecified: Secondary | ICD-10-CM

## 2014-04-06 DIAGNOSIS — G919 Hydrocephalus, unspecified: Secondary | ICD-10-CM

## 2014-04-06 DIAGNOSIS — B009 Herpesviral infection, unspecified: Secondary | ICD-10-CM

## 2014-04-06 DIAGNOSIS — G4700 Insomnia, unspecified: Secondary | ICD-10-CM

## 2014-04-06 DIAGNOSIS — K219 Gastro-esophageal reflux disease without esophagitis: Secondary | ICD-10-CM

## 2014-04-06 DIAGNOSIS — E785 Hyperlipidemia, unspecified: Secondary | ICD-10-CM | POA: Diagnosis not present

## 2014-04-08 ENCOUNTER — Encounter (HOSPITAL_COMMUNITY): Payer: Self-pay | Admitting: Neurosurgery

## 2014-04-13 ENCOUNTER — Encounter: Payer: Self-pay | Admitting: Adult Health

## 2014-04-13 ENCOUNTER — Non-Acute Institutional Stay (SKILLED_NURSING_FACILITY): Payer: Medicare Other | Admitting: Adult Health

## 2014-04-13 DIAGNOSIS — N390 Urinary tract infection, site not specified: Secondary | ICD-10-CM | POA: Diagnosis not present

## 2014-04-13 NOTE — Progress Notes (Signed)
Patient ID: Sharon [name], female   DOB: May 28, 1934, 80 y.o.   MRN: [id number]   04/13/14  Facility:  Nurs[facility] Home Location:  [facility] Room Number: 1202-P LEVEL OF CARE:  SNF (31)   Chief Complaint  Patient presents with  . Acute Visit    UTI    HISTORY OF PRESENT ILLNESS:  This is a 80 year old female who was recently treated for UTI while in the hospital. A follow-up urine culture showed >= 100,000 colonies/ml vancomycin resistant enterococcus isolated. No fever nor hematuria was reported.  PAST MEDICAL HISTORY:  Past Medical History  Diagnosis Date  . Dyslipidemia   . Cystitis 03/02/2014    acute   . UTI (urinary tract infection) 03/02/2014  . Arthritis     osteo    CURRENT MEDICATIONS: Reviewed per MAR/see medication list  No Known Allergies   REVIEW OF SYSTEMS:  GENERAL: no change in appetite, no fatigue, no weight changes, no fever, chills or weakness RESPIRATORY: no cough, SOB, DOE, wheezing, hemoptysis CARDIAC: no chest pain, edema or palpitations GI: no abdominal pain, diarrhea, constipation, heart burn, nausea or vomiting  PHYSICAL EXAMINATION  GENERAL: no acute distress, normal body habitus EYES: conjunctivae normal, sclerae normal, normal eye lids NECK: supple, trachea midline, no neck masses, no thyroid tenderness, no thyromegaly LYMPHATICS: no LAN in the neck, no supraclavicular LAN RESPIRATORY: breathing is even & unlabored, BS CTAB CARDIAC: RRR, no murmur,no extra heart sounds, no edema GI: abdomen soft, normal BS, no masses, no tenderness, no hepatomegaly, no splenomegaly EXTREMITIES: able to move 4 extremities PSYCHIATRIC: the patient is alert & oriented to person, affect & behavior appropriate  LABS/RADIOLOGY: 04/09/14  WBC 6.8 hemoglobin 12.5 hematocrit 37.6 MCV 96.9 sodium 137 potassium 4.9 glucose 90 BUN 10 creatinine 0.69 calcium 8.8 Labs reviewed: Basic Metabolic Panel:  Recent Labs  03/16/14 0800  03/17/14 0520 03/18/14 0410 03/19/14 0605 03/20/14 1800 03/28/14 0410  NA 131* 131* 140 138 135 135  K 4.1 4.1 3.8 3.8 4.2 4.2  CL 104 102 107 104 97 99  CO2 25 25 28 30 31 28   GLUCOSE 110* 96 137* 97 110* 99  BUN 11 8 9 7 13 11   CREATININE 0.64 0.58 0.51 0.52 0.62 0.68  CALCIUM 7.1* 7.5* 8.9 8.6 8.9 8.5  MG 2.0 2.1 1.9  --   --   --   PHOS 3.0 2.2* 2.9  --   --   --    Liver Function Tests:  Recent Labs  03/15/14 0300 03/19/14 0605 03/28/14 0410  AST 18 14 20   ALT 25 10 12   ALKPHOS 64 61 54  BILITOT 0.4 0.5 0.2*  PROT 5.5* 5.7* 5.1*  ALBUMIN 2.9* 3.1* 2.8*    Recent Labs  03/10/14 1350  AMMONIA 18   CBC:  Recent Labs  03/11/14 1610  03/19/14 0605 03/20/14 1800 03/28/14 0410  WBC 14.2*  < > 10.4 9.7 6.6  NEUTROABS 10.9*  --  8.1* 8.0*  --   HGB 12.6  < > 11.9* 12.5 10.7*  HCT 37.2  < > 35.2* 37.6 33.3*  MCV 95.9  < > 96.2 97.4 97.9  PLT 290  < > 229 246 224  < > = values in this interval not displayed.  Lipid Panel:  Recent Labs  03/10/14 0324  HDL 61   Cardiac Enzymes:  Recent Labs  03/13/14 2116 03/14/14 0345 03/14/14 0916  TROPONINI <0.03 <0.03 <0.03   CBG:  Recent Labs  03/13/14 0353 03/13/14 0734 03/13/14 1141  GLUCAP 140* 115* 130*     Ct Head Wo Contrast  03/29/2014   CLINICAL DATA:  80 year old female confused. History of subarachnoid hemorrhage with aneurysm treated with clipping. Subsequent encounter.  EXAM: CT HEAD WITHOUT CONTRAST  TECHNIQUE: Contiguous axial images were obtained from the base of the skull through the vertex without intravenous contrast.  COMPARISON:  Several prior exams most recent is a head CT from 03/20/2014.  FINDINGS: Ventricular shunt is in place entering from the right posterior temporal -parietal region with the catheter extending through portion of the right lateral ventricle, third ventricle with the tip in the region of the genu of the left internal capsule.  Ventricular size similar to the  03/20/2014 exam, minimally more prominent than the 03/16/2014 exam although significantly improved when compared to 03/10/2014 exam.  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  Previously noted residual tiny amount of blood within the right sylvian fissure has cleared. No new intracranial hemorrhage detected.  Small vessel disease type changes. No CT evidence of large acute thrombotic infarct.  No intracranial mass lesion noted on this unenhanced exam.  IMPRESSION: Ventricular shunt with the tip in the region of the genu of the left internal capsule.  Ventricular size similar to the 03/20/2014 exam, minimally more prominent than the 03/16/2014 exam although significantly improved when compared to 03/10/2014 exam.  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  Previously noted residual tiny amount of blood within the right sylvian fissure has cleared. No new intracranial hemorrhage detected.  Small vessel disease type changes. No CT evidence of large acute thrombotic infarct.   Electronically Signed   By: Chauncey Cruel M.D.   On: 03/29/2014 08:11   Ct Head Wo Contrast  03/20/2014   CLINICAL DATA:  Lethargy  EXAM: CT HEAD WITHOUT CONTRAST  TECHNIQUE: Contiguous axial images were obtained from the base of the skull through the vertex without intravenous contrast.  COMPARISON:  03/16/2014  FINDINGS: Prior coiling of RIGHT posterior communicating artery region aneurysm with associated beam hardening artifacts.  Intraventricular catheter via RIGHT parietal approach again identified tip traversing the third ventricle into the LEFT splenic region.  Normal ventricular morphology.  No midline shift or mass effect.  Small vessel chronic ischemic changes of deep cerebral white matter.  No intracranial hemorrhage, mass lesion, or evidence acute infarction.  No extra-axial fluid collections.  Bones and sinuses unremarkable.  IMPRESSION: Post  procedural changes from aneurysm coiling and ventricular shunting again seen, stable.  Small vessel chronic ischemic changes of deep cerebral white matter.  No acute intracranial abnormalities.   Electronically Signed   By: Lavonia Dana M.D.   On: 03/20/2014 18:06   Ct Head Wo Contrast  03/16/2014   CLINICAL DATA:  Hydrocephalus. Altered mental status. Status post shunt placement recent coiling of right posterior communicating region aneurysm.  EXAM: CT HEAD WITHOUT CONTRAST  TECHNIQUE: Contiguous axial images were obtained from the base of the skull through the vertex without intravenous contrast.  COMPARISON:  03/10/2014  FINDINGS: Embolization coils are again seen at the site of previously treated right posterior communicating region aneurysm with associated streak artifact. Right parietal approach ventriculostomy catheter has been placed and courses in or adjacent to the inferior atrium of the right lateral ventricle, traverses the third ventricle, and terminates in the region of the left ventral thalamus. A small amount of pneumocephalus is present with gas noted in the right  atrium and left temporal horn. The ventricles are smaller in size than on the prior CT.  Trace residual subarachnoid hemorrhage is again seen in the sylvian fissures, less conspicuous than on the prior study. There is no evidence of acute large territory cortical infarct, new intracranial hemorrhage, mass, midline shift, or extra-axial collection. Small amount of periventricular white matter hypoattenuation is similar to the prior study and nonspecific but may reflect mild chronic small vessel ischemic disease.  Prior bilateral cataract extraction is noted. Visualized mastoid air cells are clear. Visualized paranasal sinuses are clear.  IMPRESSION: 1. Interval ventriculostomy catheter placement as above. Decreased size of the ventricles. 2. Trace residual subarachnoid hemorrhage, decreased from prior.   Electronically Signed   By: Logan Bores   On: 03/16/2014 15:28   Ir Fluoro Guide Cv Line Right  03/15/2014   CLINICAL DATA:  80 year old female requiring IV access long-term. A recent PICC has been withdrawn and requires replacement.  EXAM: PICC PLACEMENT WITH ULTRASOUND AND FLUOROSCOPY  FLUOROSCOPY TIME:  Eighteen seconds  TECHNIQUE: After written informed consent was obtained, patient was placed in the supine position on angiographic table.  A scout image of the chest confirms that the tip of the PICC is located in the right subclavian vein.  Region was prepped using maximum barrier technique including cap and mask, sterile gown, sterile gloves, large sterile sheet, and Chlorhexidine as cutaneous antisepsis. The region was infiltrated locally with 1% lidocaine.  Under fluoroscopic guidance, the PICC was withdrawn further to the area of the axillary vein and the catheter was cut at the skin site. A 0.018 guidewire was advanced through the PICC new of and the catheter was removed over the guidewire. A new peel-away sheath was placed through the vein access. The wire was used to measure the new length of the catheter.  A 5-French double-lumen power injectable PICC trimmed to 36cm was advanced, positioned with its tip near the cavoatrial junction. Spot chest radiograph confirms appropriate catheter position. Catheter was flushed per protocol and secured externally with 0-Prolene sutures. The patient tolerated procedure well, with no immediate complication.  COMPLICATIONS: None  IMPRESSION: Status post fluoroscopic exchange of right upper extremity PICC for a new 36 cm, power injectable, double-lumen PICC. Catheter ready for use.  Signed,  Dulcy Fanny. Earleen Newport, DO  [facility]  [facility]   Electronically Signed   By: Corrie Mckusick D.O.   On: 03/15/2014 12:23    ASSESSMENT/PLAN:  UTI - start Macrobid 100 mg by mouth twice a day 7 days and Florastor 250 mg PO BID X 10 days   [facility],  NP [place]

## 2014-04-13 NOTE — Progress Notes (Signed)
Patient ID: Sharon [name], female   DOB: 1934/03/18, 80 y.o.   MRN: [id number]   04/06/14  Facility:  Nursing Home Location:  [facility] Room Number: 1202-P LEVEL OF CARE:  SNF (31)   Chief Complaint  Patient presents with  . Hospitalization Follow-up    SAH S/P coiling, hydrocephalus S/P VP shunt, GERD, Insomnia, HSV and hyperlipidemia    HISTORY OF PRESENT ILLNESS:  This is a 80 year old female is being admitted to [facility] on 04/05/14 from [facility]. She was admitted to the hospital with altered mental status. MRI showed aneurysm measuring 11 mm. Lumbar puncture showed significant subarachnoid blood. Interventional radiology consulted, cerebral arteriogram completed, she underwent coiling of aneurysm on 03/05/2014. Follow-up CT scan on March 06, 2014 showed increasing communicating hydrocephalus with the patient having episodes of increasing confusion and incontinence. Neurosurgery performed a right occipital VP shunt on 03/15/2014. She has been admitted for a short-term rehabilitation.  Patient is seen in her room sleeping, but arousable, alert and oriented. Daughter and family friend at bedside. Daughter reported that patient did not sleep last night and seems to think that she is having the reverse effect on trazodone and Ritalin. Daughter thinks that trazodone is making her awake and Ritalin is making her sleepy in the morning.   PAST MEDICAL HISTORY:  Past Medical History  Diagnosis Date  . Dyslipidemia   . Cystitis 03/02/2014    acute   . UTI (urinary tract infection) 03/02/2014  . Arthritis     osteo    CURRENT MEDICATIONS: Reviewed per MAR/see medication list  No Known Allergies   REVIEW OF SYSTEMS:  GENERAL: no change in appetite, no fatigue, no weight changes, no fever, chills or weakness RESPIRATORY: no cough, SOB, DOE, wheezing, hemoptysis CARDIAC: no chest pain, edema or palpitations GI: no abdominal pain,  diarrhea, constipation, heart burn, nausea or vomiting  PHYSICAL EXAMINATION  GENERAL: no acute distress, normal body habitus SKIN:  Right buttock has 2 blisters with erythema EYES: conjunctivae normal, sclerae normal, normal eye lids NECK: supple, trachea midline, no neck masses, no thyroid tenderness, no thyromegaly LYMPHATICS: no LAN in the neck, no supraclavicular LAN RESPIRATORY: breathing is even & unlabored, BS CTAB CARDIAC: RRR, no murmur,no extra heart sounds, no edema GI: abdomen soft, normal BS, no masses, no tenderness, no hepatomegaly, no splenomegaly EXTREMITIES: able to move 4 extremities PSYCHIATRIC: the patient is alert & oriented to person, affect & behavior appropriate  LABS/RADIOLOGY: Labs reviewed: Basic Metabolic Panel:  Recent Labs  03/16/14 0800 03/17/14 0520 03/18/14 0410 03/19/14 0605 03/20/14 1800 03/28/14 0410  NA 131* 131* 140 138 135 135  K 4.1 4.1 3.8 3.8 4.2 4.2  CL 104 102 107 104 97 99  CO2 25 25 28 30 31 28   GLUCOSE 110* 96 137* 97 110* 99  BUN 11 8 9 7 13 11   CREATININE 0.64 0.58 0.51 0.52 0.62 0.68  CALCIUM 7.1* 7.5* 8.9 8.6 8.9 8.5  MG 2.0 2.1 1.9  --   --   --   PHOS 3.0 2.2* 2.9  --   --   --    Liver Function Tests:  Recent Labs  03/15/14 0300 03/19/14 0605 03/28/14 0410  AST 18 14 20   ALT 25 10 12   ALKPHOS 64 61 54  BILITOT 0.4 0.5 0.2*  PROT 5.5* 5.7* 5.1*  ALBUMIN 2.9* 3.1* 2.8*    Recent Labs  03/10/14 1350  AMMONIA 18   CBC:  Recent Labs  03/11/14 1610  03/19/14 0605 03/20/14 1800 03/28/14 0410  WBC 14.2*  < > 10.4 9.7 6.6  NEUTROABS 10.9*  --  8.1* 8.0*  --   HGB 12.6  < > 11.9* 12.5 10.7*  HCT 37.2  < > 35.2* 37.6 33.3*  MCV 95.9  < > 96.2 97.4 97.9  PLT 290  < > 229 246 224  < > = values in this interval not displayed.  Lipid Panel:  Recent Labs  03/10/14 0324  HDL 61   Cardiac Enzymes:  Recent Labs  03/13/14 2116 03/14/14 0345 03/14/14 0916  TROPONINI <0.03 <0.03 <0.03    CBG:  Recent Labs  03/13/14 0353 03/13/14 0734 03/13/14 1141  GLUCAP 140* 115* 130*     Ct Head Wo Contrast  03/29/2014   CLINICAL DATA:  80 year old female confused. History of subarachnoid hemorrhage with aneurysm treated with clipping. Subsequent encounter.  EXAM: CT HEAD WITHOUT CONTRAST  TECHNIQUE: Contiguous axial images were obtained from the base of the skull through the vertex without intravenous contrast.  COMPARISON:  Several prior exams most recent is a head CT from 03/20/2014.  FINDINGS: Ventricular shunt is in place entering from the right posterior temporal -parietal region with the catheter extending through portion of the right lateral ventricle, third ventricle with the tip in the region of the genu of the left internal capsule.  Ventricular size similar to the 03/20/2014 exam, minimally more prominent than the 03/16/2014 exam although significantly improved when compared to 03/10/2014 exam.  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  Previously noted residual tiny amount of blood within the right sylvian fissure has cleared. No new intracranial hemorrhage detected.  Small vessel disease type changes. No CT evidence of large acute thrombotic infarct.  No intracranial mass lesion noted on this unenhanced exam.  IMPRESSION: Ventricular shunt with the tip in the region of the genu of the left internal capsule.  Ventricular size similar to the 03/20/2014 exam, minimally more prominent than the 03/16/2014 exam although significantly improved when compared to 03/10/2014 exam.  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  Previously noted residual tiny amount of blood within the right sylvian fissure has cleared. No new intracranial hemorrhage detected.  Small vessel disease type changes. No CT evidence of large acute thrombotic infarct.   Electronically Signed   By: Chauncey Cruel M.D.   On: 03/29/2014 08:11    Ct Head Wo Contrast  03/20/2014   CLINICAL DATA:  Lethargy  EXAM: CT HEAD WITHOUT CONTRAST  TECHNIQUE: Contiguous axial images were obtained from the base of the skull through the vertex without intravenous contrast.  COMPARISON:  03/16/2014  FINDINGS: Prior coiling of RIGHT posterior communicating artery region aneurysm with associated beam hardening artifacts.  Intraventricular catheter via RIGHT parietal approach again identified tip traversing the third ventricle into the LEFT splenic region.  Normal ventricular morphology.  No midline shift or mass effect.  Small vessel chronic ischemic changes of deep cerebral white matter.  No intracranial hemorrhage, mass lesion, or evidence acute infarction.  No extra-axial fluid collections.  Bones and sinuses unremarkable.  IMPRESSION: Post procedural changes from aneurysm coiling and ventricular shunting again seen, stable.  Small vessel chronic ischemic changes of deep cerebral white matter.  No acute intracranial abnormalities.   Electronically Signed   By: Lavonia Dana M.D.   On: 03/20/2014 18:06   Ct Head Wo Contrast  03/16/2014   CLINICAL DATA:  Hydrocephalus. Altered mental status. Status post shunt placement recent coiling of right posterior communicating region aneurysm.  EXAM: CT HEAD WITHOUT CONTRAST  TECHNIQUE: Contiguous axial images were obtained from the base of the skull through the vertex without intravenous contrast.  COMPARISON:  03/10/2014  FINDINGS: Embolization coils are again seen at the site of previously treated right posterior communicating region aneurysm with associated streak artifact. Right parietal approach ventriculostomy catheter has been placed and courses in or adjacent to the inferior atrium of the right lateral ventricle, traverses the third ventricle, and terminates in the region of the left ventral thalamus. A small amount of pneumocephalus is present with gas noted in the right atrium and left temporal horn. The ventricles  are smaller in size than on the prior CT.  Trace residual subarachnoid hemorrhage is again seen in the sylvian fissures, less conspicuous than on the prior study. There is no evidence of acute large territory cortical infarct, new intracranial hemorrhage, mass, midline shift, or extra-axial collection. Small amount of periventricular white matter hypoattenuation is similar to the prior study and nonspecific but may reflect mild chronic small vessel ischemic disease.  Prior bilateral cataract extraction is noted. Visualized mastoid air cells are clear. Visualized paranasal sinuses are clear.  IMPRESSION: 1. Interval ventriculostomy catheter placement as above. Decreased size of the ventricles. 2. Trace residual subarachnoid hemorrhage, decreased from prior.   Electronically Signed   By: Logan Bores   On: 03/16/2014 15:28   Ir Fluoro Guide Cv Line Right  03/15/2014   CLINICAL DATA:  80 year old female requiring IV access long-term. A recent PICC has been withdrawn and requires replacement.  EXAM: PICC PLACEMENT WITH ULTRASOUND AND FLUOROSCOPY  FLUOROSCOPY TIME:  Eighteen seconds  TECHNIQUE: After written informed consent was obtained, patient was placed in the supine position on angiographic table.  A scout image of the chest confirms that the tip of the PICC is located in the right subclavian vein.  Region was prepped using maximum barrier technique including cap and mask, sterile gown, sterile gloves, large sterile sheet, and Chlorhexidine as cutaneous antisepsis. The region was infiltrated locally with 1% lidocaine.  Under fluoroscopic guidance, the PICC was withdrawn further to the area of the axillary vein and the catheter was cut at the skin site. A 0.018 guidewire was advanced through the PICC new of and the catheter was removed over the guidewire. A new peel-away sheath was placed through the vein access. The wire was used to measure the new length of the catheter.  A 5-French double-lumen power injectable  PICC trimmed to 36cm was advanced, positioned with its tip near the cavoatrial junction. Spot chest radiograph confirms appropriate catheter position. Catheter was flushed per protocol and secured externally with 0-Prolene sutures. The patient tolerated procedure well, with no immediate complication.  COMPLICATIONS: None  IMPRESSION: Status post fluoroscopic exchange of right upper extremity PICC for a new 36 cm, power injectable, double-lumen PICC. Catheter ready for use.  Signed,  Dulcy Fanny. Earleen Newport, DO  [facility]  [facility]   Electronically Signed   By: Corrie Mckusick D.O.   On: 03/15/2014 12:23    ASSESSMENT/PLAN:  SAH S/P coiling - decrease Ritalin 10 mg PO Q 6AM instead of BID; for rehabililitation Hydrocephalus S/P VP shunt - follow-up with Dr. Earnie Larsson, neurosurgery GERD - stable; continue Pepcid 20 mg by mouth daily Insomnia - hold trazodone for tonight and will re-evaluate tomorrow  HSV type 1 - continue Zovirax ointment to right buttock  Hyperlipidemia - continue Zocor 20 mg by mouth daily    Goals of care:  Short-term rehabilitation   Labs/test ordered:   CBC, CMP and urinalysis with culture and sensitivity   Spent 50 minutes in patient care.    [facility], NP [employer] [phone]

## 2014-04-20 ENCOUNTER — Encounter: Payer: Self-pay | Admitting: Neurology

## 2014-04-20 ENCOUNTER — Ambulatory Visit (INDEPENDENT_AMBULATORY_CARE_PROVIDER_SITE_OTHER): Payer: Medicare Other | Admitting: Neurology

## 2014-04-20 VITALS — BP 119/81 | HR 96 | Resp 16 | Ht 65.0 in | Wt 120.0 lb

## 2014-04-20 DIAGNOSIS — R410 Disorientation, unspecified: Secondary | ICD-10-CM

## 2014-04-20 DIAGNOSIS — F05 Delirium due to known physiological condition: Secondary | ICD-10-CM

## 2014-04-20 DIAGNOSIS — I609 Nontraumatic subarachnoid hemorrhage, unspecified: Secondary | ICD-10-CM

## 2014-04-20 MED ORDER — METHYLPHENIDATE HCL 10 MG PO TABS: 10.0000 mg | ORAL_TABLET | Freq: Two times a day (BID) | ORAL | Status: DC

## 2014-04-20 MED ORDER — DONEPEZIL HCL 5 MG PO TABS: 5.0000 mg | ORAL_TABLET | Freq: Every day | ORAL | Status: DC

## 2014-04-20 NOTE — Patient Instructions (Signed)
Subarachnoid Hemorrhage Subarachnoid hemorrhage is bleeding in the area between the brain and the membrane that covers the brain. The bleeding puts more pressure on the brain and stops blood from reaching some areas of the brain. It is very serious. It may cause brain damage, stroke, or death if not treated. You must be treated in the hospital right away.  SYMPTOMS   Having a sudden, severe headache.  Feeling sick to your stomach (nauseous) or throwing up (vomiting) combined with other problems.  Suddenly feeling weak.  Losing feeling on your face, arm, or leg, especially on one side of the body.  Suddenly having trouble walking or moving your arms or legs.  Suddenly feeling confused.  Suddenly having a change in mood or personality.  Having trouble talking or understanding.  Having trouble swallowing.  Suddenly having trouble seeing.  Seeing double.  Feeling dizzy.  Losing your balance or coordination.  Having light bother or hurt your eyes.  Having a stiff neck. RISK FACTORS You may be more likely to have this condition if you:  Smoke.  Have high blood pressure (hypertension).  Drink too much alcohol.  Are a female, especially after menopause.  Have a family history of disease in the blood vessels of the brain.  Have a certain inherited kidney disease or connective tissue disease. HOME CARE  Take all medicines exactly as told by your doctor.  Eat healthy foods if you can swallow.  Eat foods that are low in salt and cholesterol.  Eat foods that are low in saturated and trans fat.  If told, eat soft or pureed foods so that you do not choke.  If told, take small bites of food so that you do not choke.  Rest as told by your doctor.  Limit your activity as told by your doctor.  Do not smoke.  Limit how much alcohol you drink.  Men-drink no more than 2 drinks a day.  Women who are not pregnant-drink no more than 1 drink a day.  Make changes to your  lifestyle as told by your doctor.   Keep track of your blood pressure as told by your doctor.   Keep your home safe so you do not fall.  Put grab bars in the bedroom and bathroom.  Raise toilet seats.  Put a seat in the shower.  Go to therapy sessions as told by your doctor. This may include physical, occupational, and speech therapy.  Use a walker or cane at all times, if told to do so.  Keep all follow-up visits with your doctor and other specialists.  GET HELP RIGHT AWAY IF:  You have a sudden, severe headache with no known cause.  You are sick to your stomach or throw up, and have another problem.  You have a sudden weakness.  You lose feeling on one side of your body.  You suddenly have trouble walking or moving arms or legs.  You suddenly feel confused.  You have trouble talking or understanding.  You suddenly have trouble seeing.  You lose your balance or your movements are not coordinated.  You have a stiff neck.  You have trouble breathing.  You are partly or totally unaware of what is going on around you. The symptoms above may be a sign of a serious problem that is an emergency. Do not wait to see if the symptoms will go away. Get medical help right away. Call your local emergency services (911 in [place] Do not drive yourself to  the hospital.  MAKE SURE YOU:  Understand these instructions.  Will watch your condition.  Will get help right away if you are not doing well or get worse. Document Released: 06/16/2012 Document Revised: 07/06/2013 Document Reviewed: 06/16/2012 [facility] Patient Information 2015 [place], [place]. This information is not intended to replace advice given to you by your health care provider. Make sure you discuss any questions you have with your health care provider.

## 2014-04-20 NOTE — Progress Notes (Signed)
SLEEP MEDICINE CLINIC   Provider:  Larey Seat, M D  Referring Provider: Kelton Pillar, MD Primary Care Physician:  Osborne Casco, MD  Chief Complaint  Patient presents with  . NP  Laurann Montana confusion, memory    Has aneurysm, Rm 60, daughter    HPI:  Sharon [name] is a 80 y.o. female  Is seen here as an urgent referral from Dr. Kelton Pillar for mental status changes.   Mrs. Rezabek  and had a very eventful end of the last year . He was suspected to have a UTI causing confusion, and was admitted on 02-28-14 , given multiple doses of ativan -  Her agitation got worse. The  attempt to do a lumbar puncture was unsuccessful. She needed sedation for an MRI, which did showed an aneurysm. Under the MRI sedation a LP was finally done, the CSF was cloudy.  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 She was coiled by Dr. Estanislado Pandy .  [name]-  but a communicating hydrocephalus developed nonetheless, and  a VP shunt was placed on Jan.  11th 2016 by Dr. Trenton Gammon. Since the aneurysm bleed occurred the patient also developed a urinary tract infection indeed and 100,000 colonies were seen on 04/11/2014 in her urinary sample that was obtained while she was in rehabilitation at [facility]. The main concern now is that this 80 year old right-handed Caucasian lady has still continued to have some altered mental status. She is not quite as concentrated and focused and she is repetitive. Quite forgetful,  seemed to be : ZEITGITTERSTOERUNGEN "  timeframe issues as to what happened last week versus  last month was a decade ago.   The patient used to live independently prior to December 2015. She was referred referred from hospital to come in place for rehabilitation per physical and occupational therapy were ongoing. She was placed on the more top to prevent muscle spasms. Decadron  protocol was directed. In generally the patient still had some low-grade fevers that maintained on broad-spectrum antibiotics these stopped. She tends to confabulate during the exam and she has mild left-sided dysmetria and ataxia clearly related to the right-sided lesion. She is easily distracted. She was placed on Ritalin at bedtime to help improve anytime arousal focus attention and issues in regards to mental status. She is developed insomnia which is very common after an aneurysm bleed. She sometimes restless but she is doing much better than she has a visitor that she recognizes and trusts. She is currently on Pepcid, on Ritalin, Zocor and dizzy well 50 mg at bedtime only. She is a mechanical soft diet and thin liquids. Diet change during her [facility] rehabilitation stay to a regular diet. I was able to review the patient's recent lab results from 04-13-14 showed a normal CBC and differential her platelet count was 463,000. Her urinalysis confirmed 2 days after the beginning of Macrobid only few bacteria. Hepatic metabolic panel was entirely normal including creatinine and potassium and sodium. I did not see a TSH. However the TSH would unlikely be affected with and only 6 weeks after an aneurysm bleed. I will refer the patient also for transcranial Doppler study to make sure that there is no muscle spasm at the current time. She does not complain about headaches right now. Neither the patient nor her daughter confirm any nausea the last couple of days. She has no neck stiffness.  Review of Systems: Out of a complete 14 system review, the patient complains of only the following symptoms, and all other reviewed systems are negative. Distractible, mild ambulatory a pack impairment is a left-sided expected weakness and mild spasticity. The patient is able to follow simple one and two-step commands promptly. She was oriented to the year months of the year day of the week and date. As well as to the  [place] state and city. He scored 23 out of 30 on the Mini-Mental Status Examination today having most trouble with the recall of 3 words apple, Penny, table. Her baseline confusion is already much improved, it is her short-term memory but is currently most efficient. I think that she physically by speech therapy and occupational therapy is doing unexpectedly better than usual for her age and for patient with her diagnosis. Cognitively I expect a slow recovery over the next 6 months. I would set a tentative date of late June to reevaluate her cognitive function as it probably new baseline will then have emerged.   History   Social History  . Marital Status: Widowed    Spouse Name: N/A  . Number of Children: 4  . Years of Education: hs grad   Occupational History  . Not on file.   Social History Main Topics  . Smoking status: Former Smoker    Types: Cigarettes    Quit date: 02/28/2014  . Smokeless tobacco: Never Used  . Alcohol Use: No     Comment: recovering alcoholic  . Drug Use: No  . Sexual Activity: Not on file   Other Topics Concern  . Not on file   Social History Narrative   Caffeine 1 cup am avg.    Family History  Problem Relation Age of Onset  . Hypertension Mother   . Hypertension Father     Past Medical History  Diagnosis Date  . Dyslipidemia   . Cystitis 03/02/2014    acute   . UTI (urinary tract infection) 03/02/2014  . Arthritis     osteo    Past Surgical History  Procedure Laterality Date  . Abdominal hysterectomy    . Radiology with anesthesia N/A 03/04/2014    Procedure: RADIOLOGY WITH ANESTHESIA;  Surgeon: Medication Radiologist, MD;  Location: [facility];  Service: Radiology;  Laterality: N/A;  . Radiology with anesthesia N/A 03/04/2014    Procedure: RADIOLOGY WITH ANESTHESIA;  Surgeon: Rob Hickman, MD;  Location: [facility];  Service: Radiology;  Laterality: N/A;  . Ventriculoperitoneal shunt Right 03/15/2014    Procedure: SHUNT INSERTION  VENTRICULAR-PERITONEAL;  Surgeon: Charlie Pitter, MD;  Location: [facility];  Service: Neurosurgery;  Laterality: Right;    Current Outpatient Prescriptions  Medication Sig Dispense Refill  . aspirin EC 81 MG tablet Take 81 mg by mouth daily.    . cholecalciferol (VITAMIN D) 1000 UNITS tablet Take 1,000 Units by mouth daily.    Marland Kitchen ibuprofen (ADVIL,MOTRIN) 200 MG tablet Take 200 mg by mouth every 6 (six) hours as needed.    . Minoxidil (ROGAINE EX) Apply 1 application topically daily.    . naproxen sodium (ANAPROX) 220 MG tablet Take 220 mg by mouth 2 (two) times daily with a meal.    . NON FORMULARY Bone injection/ once every 6 mo Bone health tablet once monthly.    . Omega-3 Fatty Acids (FISH OIL PO) Take 1 tablet by mouth daily.    . ondansetron (ZOFRAN ODT) 4 MG disintegrating tablet Take 1 tablet (4 mg  total) by mouth every 8 (eight) hours as needed for nausea or vomiting. 2 tablet 0  . RENOVA 0.02 % CREA Apply 1 application topically daily.  3  . simvastatin (ZOCOR) 20 MG tablet Take 1 tablet by mouth daily.  6  . vitamin E 100 UNIT capsule Take 100 Units by mouth daily.     No current facility-administered medications for this visit.    Allergies as of 04/20/2014  . (No Known Allergies)    Vitals: BP 119/81 mmHg  Pulse 96  Resp 16  Ht 5\' 5"  (1.651 m)  Wt 120 lb (54.432 kg)  BMI 19.97 kg/m2 Last Weight:  Wt Readings from Last 1 Encounters:  04/20/14 120 lb (54.432 kg)       Last Height:   Ht Readings from Last 1 Encounters:  04/20/14 5\' 5"  (1.651 m)    Physical exam:  General: The patient is awake, alert and appears not in acute distress. The patient is well groomed. Head: Normocephalic, atraumatic. Neck is supple. Mallampati 2   neck circumference: 13.5. Nasal airflow  Unrestricted , TMJ is not  evident . Retrognathia is seen.  Cardiovascular:  Regular rate and rhythm , without  murmurs or carotid bruit, and without distended neck veins. Respiratory: Lungs are clear  to auscultation. Skin:  Without evidence of edema, or rash Trunk: BMI is  elevated and patient  has normal posture.  Neurologic exam : The patient is awake and alert, oriented to place and time.   Memory subjective described as intact.  There is significant  attention span & concentration ability.  Speech is fluent without dysarthria, dysphonia or aphasia. Mood and affect are appropriate.  Cranial nerves: Pupils are equal and briskly reactive to light. Funduscopic exam without   evidence of pallor or edema.  Extraocular movements  in vertical and horizontal planes intact and without nystagmus. Visual fields by finger perimetry are intact. Hearing to finger rub intact.  Facial sensation intact to fine touch. Facial motor strength is symmetric and tongue and uvula move midline.  Motor exam:  Normal tone, muscle bulk - but left sided weakness is noted.  Sensory:  Fine touch, pinprick and vibration were present in all extremities. Proprioception is disturbed - let pronator drift/  Coordination: Rapid alternating movements in the fingers/hands is normal. Finger-to-nose maneuver  normal without evidence of ataxia, dysmetria or tremor.  Gait and station: Patient walks without assistive device and is able unassisted to climb up to the exam table. Strength within normal limits. Left leg limping noted, she drifts to the left .   Stance is wide based, drifting to the left  Tandem gait is  fragmented. Romberg testing is positive. .  Deep tendon reflexes: in the  upper and lower extremities are symmetric and intact. Babinski maneuver response is  Down going right and up-going left .   Assessment:  After physical and neurologic examination, review of laboratory studies, imaging, neurophysiology testing and pre-existing records, assessment is   1) This is a post aneurysm bleed patient , she has the not untypical cognitive disturbances form the bleed and vasospasm.  She is remarkably headache free- she  has distractibility and confusion, perseveration and memory loss- She is to be maintained on Ritalin, start cranberry extract for UTI prophylaxis.  I like for the patient to stay with company when she returns to her private home from her current rehabilitation facility. She will need guidance. She will also need help with regular medication intake and her doctor's  appointments she is certainly not able to drive and may not be from many month. I like for her to be physically active she has a slight increased risk of falling due to the left-sided weakness. But walking and company should be a good exercise for her. In addition I will order a transcranial Doppler study to look if any vessels spasm is still imminent. I also like to see Mrs. Vandam every 6 weeks for a new Mini-Mental Status Examination. Again my expected returned to a baseline will be end of June 2016. I will also order in addition at [facility] thyroid-stimulating hormone and we'll asked the patient to take a multivitamin. Rv in 6 weeks.    The patient was advised of the nature of the diagnosed sleep disorder , the treatment options and risks for general a health and wellness arising from not treating the condition. Visit duration was 60 minutes.   Plan:  Treatment plan and additional workup : more than 50% of this visit was spent in discussion with the patient and her daughter about the prognosis of a brain organic syndrome, post subarachnoid bleed.      Asencion Partridge Raygen Dahm MD  04/20/2014

## 2014-04-21 ENCOUNTER — Non-Acute Institutional Stay (SKILLED_NURSING_FACILITY): Payer: Medicare Other | Admitting: Internal Medicine

## 2014-04-21 DIAGNOSIS — E785 Hyperlipidemia, unspecified: Secondary | ICD-10-CM

## 2014-04-21 DIAGNOSIS — N390 Urinary tract infection, site not specified: Secondary | ICD-10-CM

## 2014-04-21 DIAGNOSIS — I609 Nontraumatic subarachnoid hemorrhage, unspecified: Secondary | ICD-10-CM | POA: Diagnosis not present

## 2014-04-21 DIAGNOSIS — K219 Gastro-esophageal reflux disease without esophagitis: Secondary | ICD-10-CM | POA: Diagnosis not present

## 2014-04-21 DIAGNOSIS — R2681 Unsteadiness on feet: Secondary | ICD-10-CM

## 2014-04-21 NOTE — Progress Notes (Signed)
Patient ID: Sharon [name], female   DOB: 23-May-1934, 80 y.o.   MRN: [id number]     [facility]   PCP: Osborne Casco, MD   No Known Allergies  Chief Complaint  Patient presents with  . New Admit To SNF     HPI:  80 year old patient is here for short term rehabilitation post hospital admission from 03-02-14-03-18-14 and inpatient rehabilitation from 03/18/14-04/05/14 with altered mental status. CT scan of the head was negative.  MRI brain showed focal diffusion signal abnormality within the right sylvian fissure and focal extension from the right posterior communicating artery compatible with elongated aneurysm measuring 11 mm.  Lumbar puncture showed significant subarachnoid blood.  Interventional Radiology was consulted. She underwent coiling of aneurysm on 03/05/2014. Follow-up CT scan on March 06, 2014 showed increasing communicating hydrocephalus with the patient having episodes of increasing confusion and incontinence. Neurosurgery performed a right occipital VP shunt on 03/15/2014. She was put on decadron, mechanical soft diet and then she was admitted to inpatient rehab centre.  She has PMH of hyperlipidemia, tobacco abuse. Patient is seen in her room sleeping, but arousable, alert and oriented to person. Daughter and family friend at bedside. Daughter reported that patient did not sleep last night and seems to think that she is having the reverse effect on trazodone and Ritalin. Daughter thinks that trazodone is making her awake and Ritalin is making her sleepy in the morning. She was seen by neurology yesterday. No skin concerns per staff. No falls reported. She has been working with therapy team. Denies any headache this visit.   Review of Systems: from daughter mostly Constitutional: Negative for fever, chills and diaphoresis.  HENT: Negative for headache, congestion Eyes: Negative for eye pain, blurred vision, double vision and discharge.   Respiratory: Negative for cough, shortness of breath and wheezing.   Cardiovascular: Negative for chest pain, palpitations Gastrointestinal: Negative for heartburn, nausea, vomiting  Musculoskeletal: Negative for back pain, falls Skin: Negative for itching, rash.    Past Medical History  Diagnosis Date  . Dyslipidemia   . Cystitis 03/02/2014    acute   . UTI (urinary tract infection) 03/02/2014  . Arthritis     osteo   Past Surgical History  Procedure Laterality Date  . Abdominal hysterectomy    . Radiology with anesthesia N/A 03/04/2014    Procedure: RADIOLOGY WITH ANESTHESIA;  Surgeon: Medication Radiologist, MD;  Location: [facility];  Service: Radiology;  Laterality: N/A;  . Radiology with anesthesia N/A 03/04/2014    Procedure: RADIOLOGY WITH ANESTHESIA;  Surgeon: Rob Hickman, MD;  Location: [facility];  Service: Radiology;  Laterality: N/A;  . Ventriculoperitoneal shunt Right 03/15/2014    Procedure: SHUNT INSERTION VENTRICULAR-PERITONEAL;  Surgeon: Charlie Pitter, MD;  Location: [facility];  Service: Neurosurgery;  Laterality: Right;   Social History:   reports that she quit smoking about 7 weeks ago. Her smoking use included Cigarettes. She has never used smokeless tobacco. She reports that she does not drink alcohol or use illicit drugs.  Family History  Problem Relation Age of Onset  . Hypertension Mother   . Hypertension Father     Medications: Patient's Medications  New Prescriptions   No medications on file  Previous Medications   ASPIRIN EC 81 MG TABLET    Take 81 mg by mouth daily.   CHOLECALCIFEROL (VITAMIN D) 1000 UNITS TABLET    Take 1,000 Units by mouth daily.   DONEPEZIL (ARICEPT) 5  MG TABLET    Take 1 tablet (5 mg total) by mouth at bedtime.   DONEPEZIL (ARICEPT) 5 MG TABLET    Take 1 tablet (5 mg total) by mouth at bedtime.   GLUCOSAMINE-CHONDROITIN 500-400 MG TABLET    Take 1 tablet by mouth daily.   IBUPROFEN (ADVIL,MOTRIN) 200 MG TABLET    Take 200  mg by mouth every 6 (six) hours as needed.   METHYLPHENIDATE (RITALIN) 10 MG TABLET    Take 1 tablet (10 mg total) by mouth 2 (two) times daily.   MINOXIDIL (ROGAINE EX)    Apply 1 application topically daily.   NAPROXEN SODIUM (ANAPROX) 220 MG TABLET    Take 220 mg by mouth 2 (two) times daily with a meal.   NON FORMULARY    Bone injection/ once every 6 mo Bone health tablet once monthly.   OMEGA-3 FATTY ACIDS (FISH OIL PO)    Take 1 tablet by mouth daily.   ONDANSETRON (ZOFRAN ODT) 4 MG DISINTEGRATING TABLET    Take 1 tablet (4 mg total) by mouth every 8 (eight) hours as needed for nausea or vomiting.   RENOVA 0.02 % CREA    Apply 1 application topically daily.   SIMVASTATIN (ZOCOR) 20 MG TABLET    Take 1 tablet by mouth daily.   VITAMIN E 100 UNIT CAPSULE    Take 100 Units by mouth daily.  Modified Medications   No medications on file  Discontinued Medications   No medications on file     Physical Exam: Filed Vitals:   04/21/14 1442  BP: 117/75  Pulse: 98  Temp: 97.6 F (36.4 C)  Resp: 18  SpO2: 98%    General- elderly female, arousable and directable, alert Head- normocephalic, atraumatic Throat- moist mucus membrane Neck- no cervical lymphadenopathy Cardiovascular- normal s1,s2, no murmurs, no leg edema Respiratory- bilateral clear to auscultation, no wheeze, no rhonchi, no crackles, no use of accessory muscles Abdomen- bowel sounds present, soft, non tender Musculoskeletal- can move all 4 extremities, has left sided weakness  Neurological- no focal deficit Skin- warm and dry Psychiatry- alert and oriented     Labs reviewed: Basic Metabolic Panel:  Recent Labs  03/16/14 0800 03/17/14 0520 03/18/14 0410 03/19/14 0605 03/20/14 1800 03/28/14 0410  NA 131* 131* 140 138 135 135  K 4.1 4.1 3.8 3.8 4.2 4.2  CL 104 102 107 104 97 99  CO2 25 25 28 30 31 28   GLUCOSE 110* 96 137* 97 110* 99  BUN 11 8 9 7 13 11   CREATININE 0.64 0.58 0.51 0.52 0.62 0.68  CALCIUM  7.1* 7.5* 8.9 8.6 8.9 8.5  MG 2.0 2.1 1.9  --   --   --   PHOS 3.0 2.2* 2.9  --   --   --    Liver Function Tests:  Recent Labs  03/15/14 0300 03/19/14 0605 03/28/14 0410  AST 18 14 20   ALT 25 10 12   ALKPHOS 64 61 54  BILITOT 0.4 0.5 0.2*  PROT 5.5* 5.7* 5.1*  ALBUMIN 2.9* 3.1* 2.8*   No results for input(s): LIPASE, AMYLASE in the last 8760 hours.  Recent Labs  03/10/14 1350  AMMONIA 18   CBC:  Recent Labs  03/11/14 1610  03/19/14 0605 03/20/14 1800 03/28/14 0410  WBC 14.2*  < > 10.4 9.7 6.6  NEUTROABS 10.9*  --  8.1* 8.0*  --   HGB 12.6  < > 11.9* 12.5 10.7*  HCT 37.2  < >  35.2* 37.6 33.3*  MCV 95.9  < > 96.2 97.4 97.9  PLT 290  < > 229 246 224  < > = values in this interval not displayed. Cardiac Enzymes:  Recent Labs  03/13/14 2116 03/14/14 0345 03/14/14 0916  TROPONINI <0.03 <0.03 <0.03   BNP: Invalid input(s): POCBNP CBG:  Recent Labs  03/13/14 0353 03/13/14 0734 03/13/14 1141  GLUCAP 140* 115* 130*    Assessment/Plan  Gait instability Will have her work with physical therapy and occupational therapy team to help with gait training and muscle strengthening exercises.fall precautions. Skin care. Encourage to be out of bed.   SAH  S/P coiling and VP shunt. Continue f/u with neurology, reviewed neurology note from yesterday's visit. Continue Ritalin 10 mg daily for now  GERD Symptoms controlled. continue Pepcid 20 mg daily  Hyperlipidemia Continue zocor 20 mg daily  UTI Completed her course of antibiotics, currently asymptomatic, encouraged hydration, monitor clinically   Goals of care: short term rehabilitation   Family/ staff Communication: reviewed care plan with patient and nursing supervisor    Blanchie Serve, MD  [facility] [phone] (Monday-Friday 8 am - 5 pm) [phone] (afterhours)

## 2014-04-26 ENCOUNTER — Encounter (HOSPITAL_COMMUNITY): Payer: Self-pay | Admitting: Radiology

## 2014-04-26 ENCOUNTER — Non-Acute Institutional Stay (SKILLED_NURSING_FACILITY): Payer: Medicare Other | Admitting: Adult Health

## 2014-04-26 DIAGNOSIS — G919 Hydrocephalus, unspecified: Secondary | ICD-10-CM | POA: Diagnosis not present

## 2014-04-26 DIAGNOSIS — F419 Anxiety disorder, unspecified: Secondary | ICD-10-CM | POA: Diagnosis not present

## 2014-04-26 DIAGNOSIS — K219 Gastro-esophageal reflux disease without esophagitis: Secondary | ICD-10-CM | POA: Diagnosis not present

## 2014-04-26 DIAGNOSIS — I609 Nontraumatic subarachnoid hemorrhage, unspecified: Secondary | ICD-10-CM

## 2014-04-26 DIAGNOSIS — E785 Hyperlipidemia, unspecified: Secondary | ICD-10-CM

## 2014-04-26 NOTE — Progress Notes (Signed)
Patient ID: Sharon [name], female   DOB: 1934-04-20, 80 y.o.   MRN: [id number]   04/26/14  Facility:  [facility]ing Home Location:  [place] Room Number: 1202-P LEVEL OF CARE:  SNF (31)   Chief Complaint  Patient presents with  . Discharge Note    SAH S/P coiling, Hydrocephalus S/P VP shunt, GERD, Anxiety and Hyperlipidemia     HISTORY OF PRESENT ILLNESS:  This is a 80 year old female who is for discharge home with Home health PT for balance and gait stability and fall risk reduction, OT for safety awareness and self-care, ST for cognition and CNA for bathing. She has been admitted to [facility] on 04/05/14 from [facility]. She was admitted to the hospital with altered mental status. MRI showed aneurysm measuring 11 mm. Lumbar puncture showed significant subarachnoid blood. Interventional radiology consulted, cerebral arteriogram completed, she underwent coiling of aneurysm on 03/05/2014. Follow-up CT scan on March 06, 2014 showed increasing communicating hydrocephalus with the patient having episodes of increasing confusion and incontinence. Neurosurgery performed a right occipital VP shunt on 03/15/2014.   Patient was admitted to this facility for short-term rehabilitation after the patient's recent hospitalization.  Patient has completed SNF rehabilitation and therapy has cleared the patient for discharge.  PAST MEDICAL HISTORY:  Past Medical History  Diagnosis Date  . Dyslipidemia   . Cystitis 03/02/2014    acute   . UTI (urinary tract infection) 03/02/2014  . Arthritis     osteo    CURRENT MEDICATIONS: Reviewed per MAR/see medication list  No Known Allergies   REVIEW OF SYSTEMS:  GENERAL: no change in appetite, no fatigue, no weight changes, no fever, chills or weakness RESPIRATORY: no cough, SOB, DOE, wheezing, hemoptysis CARDIAC: no chest pain, edema or palpitations GI: no abdominal pain, diarrhea, constipation, heart burn,  nausea or vomiting  PHYSICAL EXAMINATION  GENERAL: no acute distress, normal body habitus NECK: supple, trachea midline, no neck masses, no thyroid tenderness, no thyromegaly LYMPHATICS: no LAN in the neck, no supraclavicular LAN RESPIRATORY: breathing is even & unlabored, BS CTAB CARDIAC: RRR, no murmur,no extra heart sounds, no edema GI: abdomen soft, normal BS, no masses, no tenderness, no hepatomegaly, no splenomegaly EXTREMITIES: able to move 4 extremities PSYCHIATRIC: the patient is alert & oriented to person, affect & behavior appropriate  LABS/RADIOLOGY: 04/10/14    WBC 6.8 hemoglobin 12.5 hematocrit 37.6 MCV 96.9 sodium 137 potassium 4.9 glucose 98 BUN 10 creatinine 0.69 calcium 8.8 Labs reviewed: Basic Metabolic Panel:  Recent Labs  03/16/14 0800 03/17/14 0520 03/18/14 0410 03/19/14 0605 03/20/14 1800 03/28/14 0410  NA 131* 131* 140 138 135 135  K 4.1 4.1 3.8 3.8 4.2 4.2  CL 104 102 107 104 97 99  CO2 25 25 28 30 31 28   GLUCOSE 110* 96 137* 97 110* 99  BUN 11 8 9 7 13 11   CREATININE 0.64 0.58 0.51 0.52 0.62 0.68  CALCIUM 7.1* 7.5* 8.9 8.6 8.9 8.5  MG 2.0 2.1 1.9  --   --   --   PHOS 3.0 2.2* 2.9  --   --   --    Liver Function Tests:  Recent Labs  03/15/14 0300 03/19/14 0605 03/28/14 0410  AST 18 14 20   ALT 25 10 12   ALKPHOS 64 61 54  BILITOT 0.4 0.5 0.2*  PROT 5.5* 5.7* 5.1*  ALBUMIN 2.9* 3.1* 2.8*    Recent Labs  03/10/14 1350  AMMONIA 18   CBC:  Recent Labs  03/11/14 1610  03/19/14 0605 03/20/14 1800 03/28/14 0410  WBC 14.2*  < > 10.4 9.7 6.6  NEUTROABS 10.9*  --  8.1* 8.0*  --   HGB 12.6  < > 11.9* 12.5 10.7*  HCT 37.2  < > 35.2* 37.6 33.3*  MCV 95.9  < > 96.2 97.4 97.9  PLT 290  < > 229 246 224  < > = values in this interval not displayed.  Lipid Panel:  Recent Labs  03/10/14 0324  HDL 61   Cardiac Enzymes:  Recent Labs  03/13/14 2116 03/14/14 0345 03/14/14 0916  TROPONINI <0.03 <0.03 <0.03   CBG:  Recent Labs   03/13/14 0353 03/13/14 0734 03/13/14 1141  GLUCAP 140* 115* 130*     Ct Head Wo Contrast  03/29/2014   CLINICAL DATA:  80 year old female confused. History of subarachnoid hemorrhage with aneurysm treated with clipping. Subsequent encounter.  EXAM: CT HEAD WITHOUT CONTRAST  TECHNIQUE: Contiguous axial images were obtained from the base of the skull through the vertex without intravenous contrast.  COMPARISON:  Several prior exams most recent is a head CT from 03/20/2014.  FINDINGS: Ventricular shunt is in place entering from the right posterior temporal -parietal region with the catheter extending through portion of the right lateral ventricle, third ventricle with the tip in the region of the genu of the left internal capsule.  Ventricular size similar to the 03/20/2014 exam, minimally more prominent than the 03/16/2014 exam although significantly improved when compared to 03/10/2014 exam.  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  Previously noted residual tiny amount of blood within the right sylvian fissure has cleared. No new intracranial hemorrhage detected.  Small vessel disease type changes. No CT evidence of large acute thrombotic infarct.  No intracranial mass lesion noted on this unenhanced exam.  IMPRESSION: Ventricular shunt with the tip in the region of the genu of the left internal capsule.  Ventricular size similar to the 03/20/2014 exam, minimally more prominent than the 03/16/2014 exam although significantly improved when compared to 03/10/2014 exam.  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  Previously noted residual tiny amount of blood within the right sylvian fissure has cleared. No new intracranial hemorrhage detected.  Small vessel disease type changes. No CT evidence of large acute thrombotic infarct.   Electronically Signed   By: Chauncey Cruel M.D.   On: 03/29/2014 08:11     ASSESSMENT/PLAN:  SAH S/P coiling - continue Ritalin 10 mg PO Q 6AM; for Home health PT, OT, ST and CNA; follow-up with Neurology, Dr. Brett Fairy Hydrocephalus S/P VP shunt - follow-up with Dr. Earnie Larsson, neurosurgery GERD - stable; continue Pepcid 20 mg by mouth daily Anxiety - mood is stable; continue Klonopin 0.5 mg by mouth daily at bedtime Hyperlipidemia - continue Zocor 20 mg by mouth daily    I have filled out patient's discharge paperwork and written prescriptions.  Patient will receive home health PT, OT, ST and CNA.  Total discharge time: Less than 30 minutes  Discharge time involved coordination of the discharge process with Education officer, museum, nursing staff and therapy department. Medical justification for home health services verified.   [facility], NP [employer] [phone]

## 2014-04-26 NOTE — Addendum Note (Signed)
Addendum  created 04/26/14 1701 by Laurie Panda, MD   Modules edited: Anesthesia Events

## 2014-04-28 DIAGNOSIS — I6901 Cognitive deficits following nontraumatic subarachnoid hemorrhage: Secondary | ICD-10-CM | POA: Diagnosis not present

## 2014-04-28 DIAGNOSIS — F419 Anxiety disorder, unspecified: Secondary | ICD-10-CM | POA: Diagnosis not present

## 2014-04-28 DIAGNOSIS — Z9181 History of falling: Secondary | ICD-10-CM | POA: Diagnosis not present

## 2014-04-28 DIAGNOSIS — M6281 Muscle weakness (generalized): Secondary | ICD-10-CM | POA: Diagnosis not present

## 2014-04-28 DIAGNOSIS — Z87440 Personal history of urinary (tract) infections: Secondary | ICD-10-CM | POA: Diagnosis not present

## 2014-04-28 DIAGNOSIS — Z982 Presence of cerebrospinal fluid drainage device: Secondary | ICD-10-CM | POA: Diagnosis not present

## 2014-04-28 DIAGNOSIS — I69098 Other sequelae following nontraumatic subarachnoid hemorrhage: Secondary | ICD-10-CM | POA: Diagnosis not present

## 2014-04-28 DIAGNOSIS — G919 Hydrocephalus, unspecified: Secondary | ICD-10-CM | POA: Diagnosis not present

## 2014-04-30 DIAGNOSIS — F0390 Unspecified dementia without behavioral disturbance: Secondary | ICD-10-CM | POA: Diagnosis not present

## 2014-04-30 DIAGNOSIS — M6281 Muscle weakness (generalized): Secondary | ICD-10-CM | POA: Diagnosis not present

## 2014-04-30 DIAGNOSIS — Z982 Presence of cerebrospinal fluid drainage device: Secondary | ICD-10-CM | POA: Diagnosis not present

## 2014-04-30 DIAGNOSIS — I6901 Cognitive deficits following nontraumatic subarachnoid hemorrhage: Secondary | ICD-10-CM | POA: Diagnosis not present

## 2014-04-30 DIAGNOSIS — I609 Nontraumatic subarachnoid hemorrhage, unspecified: Secondary | ICD-10-CM | POA: Diagnosis not present

## 2014-04-30 DIAGNOSIS — I69098 Other sequelae following nontraumatic subarachnoid hemorrhage: Secondary | ICD-10-CM | POA: Diagnosis not present

## 2014-04-30 DIAGNOSIS — E780 Pure hypercholesterolemia: Secondary | ICD-10-CM | POA: Diagnosis not present

## 2014-04-30 DIAGNOSIS — M859 Disorder of bone density and structure, unspecified: Secondary | ICD-10-CM | POA: Diagnosis not present

## 2014-04-30 DIAGNOSIS — F419 Anxiety disorder, unspecified: Secondary | ICD-10-CM | POA: Diagnosis not present

## 2014-04-30 DIAGNOSIS — N390 Urinary tract infection, site not specified: Secondary | ICD-10-CM | POA: Diagnosis not present

## 2014-04-30 DIAGNOSIS — G919 Hydrocephalus, unspecified: Secondary | ICD-10-CM | POA: Diagnosis not present

## 2014-05-03 DIAGNOSIS — G919 Hydrocephalus, unspecified: Secondary | ICD-10-CM | POA: Diagnosis not present

## 2014-05-03 DIAGNOSIS — M6281 Muscle weakness (generalized): Secondary | ICD-10-CM | POA: Diagnosis not present

## 2014-05-03 DIAGNOSIS — Z982 Presence of cerebrospinal fluid drainage device: Secondary | ICD-10-CM | POA: Diagnosis not present

## 2014-05-03 DIAGNOSIS — F419 Anxiety disorder, unspecified: Secondary | ICD-10-CM | POA: Diagnosis not present

## 2014-05-03 DIAGNOSIS — I6901 Cognitive deficits following nontraumatic subarachnoid hemorrhage: Secondary | ICD-10-CM | POA: Diagnosis not present

## 2014-05-03 DIAGNOSIS — I69098 Other sequelae following nontraumatic subarachnoid hemorrhage: Secondary | ICD-10-CM | POA: Diagnosis not present

## 2014-05-06 DIAGNOSIS — M6281 Muscle weakness (generalized): Secondary | ICD-10-CM | POA: Diagnosis not present

## 2014-05-06 DIAGNOSIS — G919 Hydrocephalus, unspecified: Secondary | ICD-10-CM | POA: Diagnosis not present

## 2014-05-06 DIAGNOSIS — Z982 Presence of cerebrospinal fluid drainage device: Secondary | ICD-10-CM | POA: Diagnosis not present

## 2014-05-06 DIAGNOSIS — F419 Anxiety disorder, unspecified: Secondary | ICD-10-CM | POA: Diagnosis not present

## 2014-05-06 DIAGNOSIS — I69098 Other sequelae following nontraumatic subarachnoid hemorrhage: Secondary | ICD-10-CM | POA: Diagnosis not present

## 2014-05-06 DIAGNOSIS — I6901 Cognitive deficits following nontraumatic subarachnoid hemorrhage: Secondary | ICD-10-CM | POA: Diagnosis not present

## 2014-05-07 DIAGNOSIS — F419 Anxiety disorder, unspecified: Secondary | ICD-10-CM | POA: Diagnosis not present

## 2014-05-07 DIAGNOSIS — I6901 Cognitive deficits following nontraumatic subarachnoid hemorrhage: Secondary | ICD-10-CM | POA: Diagnosis not present

## 2014-05-07 DIAGNOSIS — M6281 Muscle weakness (generalized): Secondary | ICD-10-CM | POA: Diagnosis not present

## 2014-05-07 DIAGNOSIS — I69098 Other sequelae following nontraumatic subarachnoid hemorrhage: Secondary | ICD-10-CM | POA: Diagnosis not present

## 2014-05-07 DIAGNOSIS — Z982 Presence of cerebrospinal fluid drainage device: Secondary | ICD-10-CM | POA: Diagnosis not present

## 2014-05-07 DIAGNOSIS — G919 Hydrocephalus, unspecified: Secondary | ICD-10-CM | POA: Diagnosis not present

## 2014-05-10 DIAGNOSIS — Z982 Presence of cerebrospinal fluid drainage device: Secondary | ICD-10-CM | POA: Diagnosis not present

## 2014-05-10 DIAGNOSIS — G919 Hydrocephalus, unspecified: Secondary | ICD-10-CM | POA: Diagnosis not present

## 2014-05-10 DIAGNOSIS — I6901 Cognitive deficits following nontraumatic subarachnoid hemorrhage: Secondary | ICD-10-CM | POA: Diagnosis not present

## 2014-05-10 DIAGNOSIS — I69098 Other sequelae following nontraumatic subarachnoid hemorrhage: Secondary | ICD-10-CM | POA: Diagnosis not present

## 2014-05-10 DIAGNOSIS — F419 Anxiety disorder, unspecified: Secondary | ICD-10-CM | POA: Diagnosis not present

## 2014-05-10 DIAGNOSIS — M6281 Muscle weakness (generalized): Secondary | ICD-10-CM | POA: Diagnosis not present

## 2014-05-12 DIAGNOSIS — M6281 Muscle weakness (generalized): Secondary | ICD-10-CM | POA: Diagnosis not present

## 2014-05-12 DIAGNOSIS — Z982 Presence of cerebrospinal fluid drainage device: Secondary | ICD-10-CM | POA: Diagnosis not present

## 2014-05-12 DIAGNOSIS — I6901 Cognitive deficits following nontraumatic subarachnoid hemorrhage: Secondary | ICD-10-CM | POA: Diagnosis not present

## 2014-05-12 DIAGNOSIS — G919 Hydrocephalus, unspecified: Secondary | ICD-10-CM | POA: Diagnosis not present

## 2014-05-12 DIAGNOSIS — I69098 Other sequelae following nontraumatic subarachnoid hemorrhage: Secondary | ICD-10-CM | POA: Diagnosis not present

## 2014-05-12 DIAGNOSIS — M25561 Pain in right knee: Secondary | ICD-10-CM | POA: Diagnosis not present

## 2014-05-12 DIAGNOSIS — F419 Anxiety disorder, unspecified: Secondary | ICD-10-CM | POA: Diagnosis not present

## 2014-05-13 DIAGNOSIS — M6281 Muscle weakness (generalized): Secondary | ICD-10-CM | POA: Diagnosis not present

## 2014-05-13 DIAGNOSIS — F419 Anxiety disorder, unspecified: Secondary | ICD-10-CM | POA: Diagnosis not present

## 2014-05-13 DIAGNOSIS — G919 Hydrocephalus, unspecified: Secondary | ICD-10-CM | POA: Diagnosis not present

## 2014-05-13 DIAGNOSIS — I69098 Other sequelae following nontraumatic subarachnoid hemorrhage: Secondary | ICD-10-CM | POA: Diagnosis not present

## 2014-05-13 DIAGNOSIS — I6901 Cognitive deficits following nontraumatic subarachnoid hemorrhage: Secondary | ICD-10-CM | POA: Diagnosis not present

## 2014-05-13 DIAGNOSIS — Z982 Presence of cerebrospinal fluid drainage device: Secondary | ICD-10-CM | POA: Diagnosis not present

## 2014-05-14 DIAGNOSIS — F419 Anxiety disorder, unspecified: Secondary | ICD-10-CM | POA: Diagnosis not present

## 2014-05-14 DIAGNOSIS — M6281 Muscle weakness (generalized): Secondary | ICD-10-CM | POA: Diagnosis not present

## 2014-05-14 DIAGNOSIS — Z982 Presence of cerebrospinal fluid drainage device: Secondary | ICD-10-CM | POA: Diagnosis not present

## 2014-05-14 DIAGNOSIS — G919 Hydrocephalus, unspecified: Secondary | ICD-10-CM | POA: Diagnosis not present

## 2014-05-14 DIAGNOSIS — I69098 Other sequelae following nontraumatic subarachnoid hemorrhage: Secondary | ICD-10-CM | POA: Diagnosis not present

## 2014-05-14 DIAGNOSIS — I6901 Cognitive deficits following nontraumatic subarachnoid hemorrhage: Secondary | ICD-10-CM | POA: Diagnosis not present

## 2014-05-15 ENCOUNTER — Other Ambulatory Visit: Payer: Self-pay | Admitting: Adult Health

## 2014-05-17 NOTE — Discharge Summary (Signed)
Physician Discharge Summary  Patient ID: Sharon [name] MRN: [id number] DOB/AGE: 1934-03-13 80 y.o.  Admit date: 03/02/2014 Discharge date: 05/17/2014  Admission Diagnoses:  Discharge Diagnoses:  Principal Problem:   Acute encephalopathy Active Problems:   Headache   UTI (lower urinary tract infection)   Dyslipidemia   Aneurysm   Meningitis   Nontraumatic subarachnoid hemorrhage   Acute respiratory failure   SAH (subarachnoid hemorrhage)   Altered mental status   Delirium   Posterior communicating artery aneurysm   Communicating hydrocephalus   Urinary tract infection, acute   HLD (hyperlipidemia)   Discharged Condition: fair  Hospital Course: Patient admitted the hospital with altered mental status of unclear etiology. Workup eventually demonstrated evidence of subarachnoid hemorrhage. Further workup demonstrated evidence of a internal carotid artery aneurysm. This was treated by intra-arterial embolization. Patient remained confused and agitated. Workup demonstrated evidence of progressive ventriculomegaly. Therapeutic/diagnostic lumbar puncture revealed increased intracranial pressure with improvement of her situation with CSF drainage. Patient and her family elected to move forward with ventriculoperitoneal shunting. Ventricle peroneal shunt was placed. Postoperatively the patient recovered slowly. She is now improved but still has significant cognitive issues which all though are present still are improved from her preoperative state.  Consults:   Significant Diagnostic Studies:   Treatments:   Discharge Exam: Blood pressure 119/62, pulse 74, temperature 97.8 F (36.6 C), temperature source Oral, resp. rate 17, height 5\' 3"  (1.6 m), weight 52.4 kg (115 lb 8.3 oz), SpO2 97 %. Awake and alert. Oriented minimally but appropriate otherwise. Speech fluent. Motor and sensory function intact. Wounds clean and dry. Chest and abdomen benign.  Disposition: 03-Skilled Nursing  Facility     Medication List    ASK your doctor about these medications        aspirin EC 81 MG tablet  Take 81 mg by mouth daily.     cholecalciferol 1000 UNITS tablet  Commonly known as:  VITAMIN D  Take 1,000 Units by mouth daily.     FISH OIL PO  Take 1 tablet by mouth daily.     ibuprofen 200 MG tablet  Commonly known as:  ADVIL,MOTRIN  Take 200 mg by mouth every 6 (six) hours as needed.     naproxen sodium 220 MG tablet  Commonly known as:  ANAPROX  Take 220 mg by mouth 2 (two) times daily with a meal.     ondansetron 4 MG disintegrating tablet  Commonly known as:  ZOFRAN ODT  Take 1 tablet (4 mg total) by mouth every 8 (eight) hours as needed for nausea or vomiting.     RENOVA 0.02 % Crea  Generic drug:  Tretinoin (Facial Wrinkles)  Apply 1 application topically daily.     ROGAINE EX  Apply 1 application topically daily.     simvastatin 20 MG tablet  Commonly known as:  ZOCOR  Take 1 tablet by mouth daily.     vitamin E 100 UNIT capsule  Take 100 Units by mouth daily.         Signed: Frederik Standley A 05/17/2014, 1:02 PM

## 2014-05-19 ENCOUNTER — Encounter: Payer: Medicare Other | Attending: Physical Medicine & Rehabilitation | Admitting: Physical Medicine & Rehabilitation

## 2014-05-19 ENCOUNTER — Encounter: Payer: Self-pay | Admitting: Physical Medicine & Rehabilitation

## 2014-05-19 VITALS — BP 130/77 | HR 78 | Resp 14

## 2014-05-19 DIAGNOSIS — Z982 Presence of cerebrospinal fluid drainage device: Secondary | ICD-10-CM | POA: Diagnosis not present

## 2014-05-19 DIAGNOSIS — I609 Nontraumatic subarachnoid hemorrhage, unspecified: Secondary | ICD-10-CM | POA: Diagnosis not present

## 2014-05-19 DIAGNOSIS — G919 Hydrocephalus, unspecified: Secondary | ICD-10-CM | POA: Diagnosis not present

## 2014-05-19 DIAGNOSIS — I6901 Cognitive deficits following nontraumatic subarachnoid hemorrhage: Secondary | ICD-10-CM | POA: Diagnosis not present

## 2014-05-19 DIAGNOSIS — M6281 Muscle weakness (generalized): Secondary | ICD-10-CM | POA: Diagnosis not present

## 2014-05-19 DIAGNOSIS — G9340 Encephalopathy, unspecified: Secondary | ICD-10-CM | POA: Diagnosis not present

## 2014-05-19 DIAGNOSIS — F419 Anxiety disorder, unspecified: Secondary | ICD-10-CM | POA: Diagnosis not present

## 2014-05-19 DIAGNOSIS — I69098 Other sequelae following nontraumatic subarachnoid hemorrhage: Secondary | ICD-10-CM | POA: Diagnosis not present

## 2014-05-19 NOTE — Patient Instructions (Addendum)
PLEASE CALL ME WITH ANY PROBLEMS OR QUESTIONS (#[phone]).     CONTINUE TAKING YOUR TIME  USE A LIST OR ORGANIZER  THINK "LINEAR" IN HOW YOU ORGANIZE YOURSELF.   KEEP UP THE GOOD WORK!!!!

## 2014-05-19 NOTE — Progress Notes (Signed)
Subjective:    Patient ID: Sharon [name], female    DOB: 1935/01/30, 80 y.o.   MRN: [id number]  HPI   Mrs. Ancrum is back regarding her SAH (with VPS). She was discharged to [facility] after rehab. She was there 4 weeks and has been home for about 3 weeks now. Speech and PT are the only disciplines who are seeing her now. She is independent with cooking basic things, self-care. She is having some occasional memory issues but even these are improving. She sometimes struggles with organization and day to day information.  They currently have a care taker at night and sometimes during the day as well. They are looking at dismissing this person soon as she's improving.    Pain Inventory Average Pain 0 Pain Right Now 0 My pain is no pain  In the last 24 hours, has pain interfered with the following? General activity 0 Relation with others 0 Enjoyment of life 0 What TIME of day is your pain at its worst? no pain Sleep (in general) Good  Pain is worse with: no pain Pain improves with: no pain Relief from Meds: no pain  Mobility walk without assistance walk with assistance how many minutes can you walk? 30 ability to climb steps?  yes do you drive?  no Do you have any goals in this area?  no  Function retired I need assistance with the following:  shopping Do you have any goals in this area?  no  Neuro/Psych confusion  Prior Studies Any changes since last visit?  no  Physicians involved in your care Any changes since last visit?  no   Family History  Problem Relation Age of Onset  . Hypertension Mother   . Hypertension Father    History   Social History  . Marital Status: Widowed    Spouse Name: N/A  . Number of Children: 4  . Years of Education: hs grad   Social History Main Topics  . Smoking status: Former Smoker    Types: Cigarettes    Quit date: 02/28/2014  . Smokeless tobacco: Never Used  . Alcohol Use: No     Comment: recovering alcoholic  .  Drug Use: No  . Sexual Activity: Not on file   Other Topics Concern  . None   Social History Narrative   Caffeine 1 cup am avg.   Past Surgical History  Procedure Laterality Date  . Abdominal hysterectomy    . Radiology with anesthesia N/A 03/04/2014    Procedure: RADIOLOGY WITH ANESTHESIA;  Surgeon: Rob Hickman, MD;  Location: [facility];  Service: Radiology;  Laterality: N/A;  . Ventriculoperitoneal shunt Right 03/15/2014    Procedure: SHUNT INSERTION VENTRICULAR-PERITONEAL;  Surgeon: Charlie Pitter, MD;  Location: [facility];  Service: Neurosurgery;  Laterality: Right;  . Radiology with anesthesia N/A 03/04/2014    Procedure: RADIOLOGY WITH ANESTHESIA;  Surgeon: Medication Radiologist, MD;  Location: [facility];  Service: Radiology;  Laterality: N/A;   Past Medical History  Diagnosis Date  . Dyslipidemia   . Cystitis 03/02/2014    acute   . UTI (urinary tract infection) 03/02/2014  . Arthritis     osteo   There were no vitals taken for this visit.  Opioid Risk Score:   Fall Risk Score:    Review of Systems  Psychiatric/Behavioral: Positive for confusion.  All other systems reviewed and are negative.      Objective:   Physical Exam  Constitutional: She appears well-developed.  HENT:  Head: Normocephalic.  Eyes: EOM are normal.  Neck: Normal range of motion. Neck supple. No thyromegaly present.  Cardiovascular: Normal rate and regular rhythm.  Respiratory: Effort normal and breath sounds normal. No respiratory distress.  GI: Soft. Bowel sounds are normal. She exhibits no distension.  Neurological: confused, cooperative.  RUE: 5/5 Deltoid, 5/5 Biceps, 5/5 Triceps, 5/5 Wrist Ext, 5/5 Grip LUE: 5/5 Deltoid, 5/5 Biceps, 5/5 Triceps, 5/5 Wrist Ext, 5/5 Grip RLE: HF 5/5, KE 5/5, KF 5/5, ADF 5/5, APF 5/5 LLE: HF 5/5, KE 5/5, HF, 5/5, ADF 5/5, APF 5/5 Tandem gait is off balance--- She lost balance to the right most often.  Oriented to place, date, time. Aware  of current events. Spelled "world" forwards and backwards. Sequenced numbers. Abstract thinking generally appropriate. Remembered 2/3 words after 5 minutes. Improved insight and awareness as a whole.     Assessment/Plan: 1. Functional deficits secondary to right-sided SAH/right posterior communicating artery aneurysm status post coiling 03/05/2014 with communicating hydrocephalus status post VP shunt 03/15/2014 -continue donepezil for the time being.   -discussed use of an organizer or notebook and the concept of "linear" organization  -will make a referral to neuro rehab SLP for higher level cognitive therapy  -pt has stopped ritalin. Will not resume. Attention and arousal are sufficient  2.Overall she's doing extremely well. Recommend ongoing supervision at home. No driving. I will see her back in about 2 months. Thirty minutes of face to face patient care time were spent during this visit. All questions were encouraged and answered.

## 2014-05-20 ENCOUNTER — Other Ambulatory Visit: Payer: Self-pay | Admitting: Neurology

## 2014-05-20 ENCOUNTER — Other Ambulatory Visit: Payer: Self-pay | Admitting: Adult Health

## 2014-05-20 DIAGNOSIS — I69098 Other sequelae following nontraumatic subarachnoid hemorrhage: Secondary | ICD-10-CM | POA: Diagnosis not present

## 2014-05-20 DIAGNOSIS — G919 Hydrocephalus, unspecified: Secondary | ICD-10-CM | POA: Diagnosis not present

## 2014-05-20 DIAGNOSIS — M6281 Muscle weakness (generalized): Secondary | ICD-10-CM | POA: Diagnosis not present

## 2014-05-20 DIAGNOSIS — I6901 Cognitive deficits following nontraumatic subarachnoid hemorrhage: Secondary | ICD-10-CM | POA: Diagnosis not present

## 2014-05-20 DIAGNOSIS — F419 Anxiety disorder, unspecified: Secondary | ICD-10-CM | POA: Diagnosis not present

## 2014-05-20 DIAGNOSIS — Z982 Presence of cerebrospinal fluid drainage device: Secondary | ICD-10-CM | POA: Diagnosis not present

## 2014-05-21 DIAGNOSIS — M6281 Muscle weakness (generalized): Secondary | ICD-10-CM | POA: Diagnosis not present

## 2014-05-21 DIAGNOSIS — G919 Hydrocephalus, unspecified: Secondary | ICD-10-CM | POA: Diagnosis not present

## 2014-05-21 DIAGNOSIS — I69098 Other sequelae following nontraumatic subarachnoid hemorrhage: Secondary | ICD-10-CM | POA: Diagnosis not present

## 2014-05-21 DIAGNOSIS — I6901 Cognitive deficits following nontraumatic subarachnoid hemorrhage: Secondary | ICD-10-CM | POA: Diagnosis not present

## 2014-05-21 DIAGNOSIS — F419 Anxiety disorder, unspecified: Secondary | ICD-10-CM | POA: Diagnosis not present

## 2014-05-21 DIAGNOSIS — Z982 Presence of cerebrospinal fluid drainage device: Secondary | ICD-10-CM | POA: Diagnosis not present

## 2014-05-26 DIAGNOSIS — I69098 Other sequelae following nontraumatic subarachnoid hemorrhage: Secondary | ICD-10-CM | POA: Diagnosis not present

## 2014-05-26 DIAGNOSIS — G919 Hydrocephalus, unspecified: Secondary | ICD-10-CM | POA: Diagnosis not present

## 2014-05-26 DIAGNOSIS — I6901 Cognitive deficits following nontraumatic subarachnoid hemorrhage: Secondary | ICD-10-CM | POA: Diagnosis not present

## 2014-05-26 DIAGNOSIS — F419 Anxiety disorder, unspecified: Secondary | ICD-10-CM | POA: Diagnosis not present

## 2014-05-26 DIAGNOSIS — Z982 Presence of cerebrospinal fluid drainage device: Secondary | ICD-10-CM | POA: Diagnosis not present

## 2014-05-26 DIAGNOSIS — M6281 Muscle weakness (generalized): Secondary | ICD-10-CM | POA: Diagnosis not present

## 2014-06-01 NOTE — Progress Notes (Signed)
Quick Note:  Pt is being followed by [facility]. Last seen by Dr. Bubba Camp on 04-21-14 and saw and spoke to daughter. Noted in chart finished abx and will monitor clinically. These results are from 04-07-14, [facility]. ______

## 2014-06-02 DIAGNOSIS — I6901 Cognitive deficits following nontraumatic subarachnoid hemorrhage: Secondary | ICD-10-CM | POA: Diagnosis not present

## 2014-06-02 DIAGNOSIS — G919 Hydrocephalus, unspecified: Secondary | ICD-10-CM | POA: Diagnosis not present

## 2014-06-02 DIAGNOSIS — I69098 Other sequelae following nontraumatic subarachnoid hemorrhage: Secondary | ICD-10-CM | POA: Diagnosis not present

## 2014-06-02 DIAGNOSIS — F419 Anxiety disorder, unspecified: Secondary | ICD-10-CM | POA: Diagnosis not present

## 2014-06-02 DIAGNOSIS — Z982 Presence of cerebrospinal fluid drainage device: Secondary | ICD-10-CM | POA: Diagnosis not present

## 2014-06-02 DIAGNOSIS — M6281 Muscle weakness (generalized): Secondary | ICD-10-CM | POA: Diagnosis not present

## 2014-06-04 DIAGNOSIS — I69098 Other sequelae following nontraumatic subarachnoid hemorrhage: Secondary | ICD-10-CM | POA: Diagnosis not present

## 2014-06-04 DIAGNOSIS — Z982 Presence of cerebrospinal fluid drainage device: Secondary | ICD-10-CM | POA: Diagnosis not present

## 2014-06-04 DIAGNOSIS — G919 Hydrocephalus, unspecified: Secondary | ICD-10-CM | POA: Diagnosis not present

## 2014-06-04 DIAGNOSIS — F419 Anxiety disorder, unspecified: Secondary | ICD-10-CM | POA: Diagnosis not present

## 2014-06-04 DIAGNOSIS — I6901 Cognitive deficits following nontraumatic subarachnoid hemorrhage: Secondary | ICD-10-CM | POA: Diagnosis not present

## 2014-06-04 DIAGNOSIS — M6281 Muscle weakness (generalized): Secondary | ICD-10-CM | POA: Diagnosis not present

## 2014-06-07 ENCOUNTER — Encounter: Payer: Self-pay | Admitting: Neurology

## 2014-06-07 ENCOUNTER — Ambulatory Visit (INDEPENDENT_AMBULATORY_CARE_PROVIDER_SITE_OTHER): Payer: Medicare Other | Admitting: Neurology

## 2014-06-07 VITALS — BP 105/63 | HR 76 | Resp 14 | Wt 121.4 lb

## 2014-06-07 DIAGNOSIS — R197 Diarrhea, unspecified: Secondary | ICD-10-CM | POA: Diagnosis not present

## 2014-06-07 DIAGNOSIS — I609 Nontraumatic subarachnoid hemorrhage, unspecified: Secondary | ICD-10-CM

## 2014-06-07 MED ORDER — DONEPEZIL HCL 10 MG PO TABS: 10.0000 mg | ORAL_TABLET | Freq: Every day | ORAL | Status: DC

## 2014-06-07 NOTE — Progress Notes (Signed)
[facility]   Provider:  Larey Seat, M D  Referring Provider: Kelton Pillar, MD Primary Care Physician:  Osborne Casco, MD  Chief Complaint  Patient presents with  . RV memory    Rm 10, care giver    HPI:  Sharon [name] is a 80 y.o. female  Is seen here as an urgent referral from Dr. Kelton Pillar for mental status changes.   Sharon [name]  and had a very eventful end of the last year . He was suspected to have a UTI causing confusion, and was admitted on 02-28-14 , given multiple doses of ativan -  Her agitation got worse. The  attempt to do a lumbar puncture was unsuccessful. She needed sedation for an MRI, which did showed an aneurysm. Under the MRI sedation a LP was finally done, the CSF was cloudy.  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 She was coiled by Dr. Estanislado Pandy .  [name]-  but a communicating hydrocephalus developed nonetheless, and  a VP shunt was placed on Jan.  11th 2016 by Dr. Trenton Gammon. Since the aneurysm bleed occurred the patient also developed a urinary tract infection indeed and 100,000 colonies were seen on 04/11/2014 in her urinary sample that was obtained while she was in rehabilitation at [facility]. The main concern now is that this 80 year old right-handed Caucasian lady has still continued to have some altered mental status. She is not quite as concentrated and focused and she is repetitive. Quite forgetful,  seemed to be : ZEITGITTERSTOERUNGEN "  timeframe issues as to what happened last week versus  last month was a decade ago.   The patient used to live independently prior to December 2015. She was referred referred from hospital to come in place for rehabilitation per physical and occupational therapy were ongoing. She was placed on the more top to prevent muscle spasms. Decadron protocol was directed. In generally  the patient still had some low-grade fevers that maintained on broad-spectrum antibiotics these stopped. She tends to confabulate during the exam and she has mild left-sided dysmetria and ataxia clearly related to the right-sided lesion. She is easily distracted. She was placed on Ritalin at bedtime to help improve anytime arousal focus attention and issues in regards to mental status. She is developed insomnia which is very common after an aneurysm bleed. She sometimes restless but she is doing much better than she has a visitor that she recognizes and trusts. She is currently on Pepcid, on Ritalin, Zocor at bedtime only. She was on a mechanical soft diet and thin liquids. Diet changed during her [facility] rehabilitation stay to a regular diet. I was able to review the patient's recent lab results from 04-13-14 showed a normal CBC and differential her platelet count was 463,000. Her urinalysis confirmed 2 days after the beginning of Macrobid only few bacteria. Hepatic metabolic panel was entirely normal including creatinine and potassium and sodium. I did not see a TSH. However the TSH would unlikely be affected with and only 6 weeks after an aneurysm bleed. I will refer the patient also for transcranial Doppler study to make sure that there is no muscle spasm at the current time. She does not complain about headaches right now. Neither the patient nor her daughter confirm any nausea the last couple of days. She has no neck stiffness.  06-07-14 The meeting today for a prolonged revisit appointment of 30 minutes.  Sharon [name] is here today with a caretaker, [name], and appears very alert and concentrated. She is reporting depression, but also hope,  She has reported diarrhea, and suspects that either her bacterial setting is disturbed from ATB , or she may have contracted clostridium . Be repeated today and MMSE that the patient had already performed the last time at 23 out of 30 points today she scored 27 out of 30  points. She recalled 2 day 2 of the 3 words that she missed last time was able to do the serial sevens completely she was not sure about the season and also the spring she stated it was fall. She was able to copy an object and clock draw a clock face correctly she named 9 animals in the animal fluency test and she wrote a perfect sentence writing. Over 2 to make it a little bit more difficult she scored 25 out of 30 on the much more difficult [place] assessment test. I also have a transcranial Doppler study available here as was normal as flow direction and velocity in all identified vessels in expected sinus no evidence of stenosis basilar spasm or occlusion. The patient fell asleep doing the test and the technician noted that she was snoring. Her  VP shunt performs well.  She is much improved.   As she has no longer confusion, she keeps active in conversation and is interested in daily activities. I will ask her to come back in June and likely will give her permission d to drive if her recovery is sustained.  I would like her to try the Ritalin that she was prescribed in [facility]. I will increase aricept to 10 mg now form 5 mg.          Review of Systems: Out of a complete 14 system review, the patient complains of only the following symptoms, and all other reviewed systems are negative. Distractible, mild ambulatory a pack impairment is a left-sided expected weakness and mild spasticity. The patient is able to follow simple one and two-step commands promptly. She was oriented to the year months of the year day of the week and date. As well as to the [place] state and city. He scored 23 out of 30 on the Mini-Mental Status Examination today having most trouble with the recall of 3 words apple, Penny, table. Her baseline confusion is already much improved, it is her short-term memory but is currently most efficient. I think that she physically by speech therapy and occupational therapy is doing  unexpectedly better than usual for her age and for patient with her diagnosis. Cognitively I expect a slow recovery over the next 6 months. I would set a tentative date of late June to reevaluate her cognitive function as it probably new baseline will then have emerged.   History   Social History  . Marital Status: Widowed    Spouse Name: N/A  . Number of Children: 4  . Years of Education: hs grad   Occupational History  . Not on file.   Social History Main Topics  . Smoking status: Former Smoker    Types: Cigarettes    Quit date: 02/28/2014  . Smokeless tobacco: Never Used  . Alcohol Use: No     Comment: recovering alcoholic  . Drug Use: No  . Sexual Activity: Not on file   Other Topics Concern  . Not on file   Social History Narrative   Caffeine 1 cup am avg.    Family  History  Problem Relation Age of Onset  . Hypertension Mother   . Hypertension Father     Past Medical History  Diagnosis Date  . Dyslipidemia   . Cystitis 03/02/2014    acute   . UTI (urinary tract infection) 03/02/2014  . Arthritis     osteo    Past Surgical History  Procedure Laterality Date  . Abdominal hysterectomy    . Radiology with anesthesia N/A 03/04/2014    Procedure: RADIOLOGY WITH ANESTHESIA;  Surgeon: Rob Hickman, MD;  Location: [facility];  Service: Radiology;  Laterality: N/A;  . Ventriculoperitoneal shunt Right 03/15/2014    Procedure: SHUNT INSERTION VENTRICULAR-PERITONEAL;  Surgeon: Charlie Pitter, MD;  Location: [facility];  Service: Neurosurgery;  Laterality: Right;  . Radiology with anesthesia N/A 03/04/2014    Procedure: RADIOLOGY WITH ANESTHESIA;  Surgeon: Medication Radiologist, MD;  Location: [facility];  Service: Radiology;  Laterality: N/A;    Current Outpatient Prescriptions  Medication Sig Dispense Refill  . aspirin EC 81 MG tablet Take 81 mg by mouth daily.    . cholecalciferol (VITAMIN D) 1000 UNITS tablet Take 1,000 Units by mouth daily.    Marland Kitchen donepezil  (ARICEPT) 5 MG tablet Take 1 tablet (5 mg total) by mouth at bedtime. 30 tablet 0  . donepezil (ARICEPT) 5 MG tablet TAKE 1 TABLET (5 MG TOTAL) BY MOUTH AT BEDTIME. 30 tablet 0  . glucosamine-chondroitin 500-400 MG tablet Take 1 tablet by mouth daily.    . Minoxidil (ROGAINE EX) Apply 1 application topically daily.    . NON FORMULARY Bone injection/ once every 6 mo Bone health tablet once monthly.    . Omega-3 Fatty Acids (FISH OIL PO) Take 1 tablet by mouth daily.    . ondansetron (ZOFRAN ODT) 4 MG disintegrating tablet Take 1 tablet (4 mg total) by mouth every 8 (eight) hours as needed for nausea or vomiting. 2 tablet 0  . Prenatal Vit-Fe Fumarate-FA (PRENATAL MULTIVITAMIN) TABS tablet Take 1 tablet by mouth daily at 12 noon.    Marland Kitchen RENOVA 0.02 % CREA Apply 1 application topically daily.  3  . simvastatin (ZOCOR) 20 MG tablet Take 1 tablet by mouth daily.  6  . vitamin E 100 UNIT capsule Take 100 Units by mouth daily.     No current facility-administered medications for this visit.    Allergies as of 06/07/2014  . (No Known Allergies)    Vitals: BP 105/63 mmHg  Pulse 76  Resp 14  Wt 121 lb 6.4 oz (55.067 kg) Last Weight:  Wt Readings from Last 1 Encounters:  06/07/14 121 lb 6.4 oz (55.067 kg)       Last Height:   Ht Readings from Last 1 Encounters:  04/26/14 5\' 3"  (1.6 m)    Physical exam:  General: The patient is awake, alert and appears not in acute distress. The patient is well groomed. Head: Normocephalic, atraumatic. Neck is supple. Mallampati 2   neck circumference: 13.5. Nasal airflow  Unrestricted , TMJ is not  evident . Retrognathia is seen.  Cardiovascular:  Regular rate and rhythm , without  murmurs or carotid bruit, and without distended neck veins. Respiratory: Lungs are clear to auscultation. Skin:  Without evidence of edema, or rash Trunk: BMI is  elevated and patient  has normal posture.  Neurologic exam : The patient is awake and alert, oriented to place  and time.   Memory subjective described as intact.  There is significant  attention span &  concentration ability.  Speech is fluent without dysarthria, dysphonia or aphasia. Mood and affect are appropriate.  Cranial nerves: Pupils are equal and briskly reactive to light. Funduscopic exam without   evidence of pallor or edema.  Extraocular movements  in vertical and horizontal planes intact and without nystagmus. Visual fields by finger perimetry are intact. Hearing to finger rub intact.  Facial sensation intact to fine touch. Facial motor strength is symmetric and tongue and uvula move midline.  Motor exam:  Normal tone, muscle bulk - but left sided weakness is noted.  Sensory:  Fine touch, pinprick and vibration were present in all extremities. Proprioception is disturbed - let pronator drift/  Coordination: Rapid alternating movements in the fingers/hands is normal. Finger-to-nose maneuver  normal without evidence of ataxia, dysmetria or tremor.  Gait and station: Patient walks without assistive device and is able unassisted to climb up to the exam table. Strength within normal limits. Left leg limping noted, she drifts to the left .   Stance is wide based, drifting to the left  Tandem gait is  fragmented. Romberg testing is positive. .  Deep tendon reflexes: in the  upper and lower extremities are symmetric and intact. Babinski maneuver response is  Down going right and up-going left .   Assessment:  After physical and neurologic examination, review of laboratory studies, imaging, neurophysiology testing and pre-existing records, assessment is   1) This is a post aneurysm bleed patient , she has the not untypical cognitive disturbances form the bleed and vasospasm.  She is remarkably headache free- she has distractibility and confusion, perseveration and memory loss- She is to be maintained on Ritalin, start cranberry extract for UTI prophylaxis.  I like for the patient to stay with company  when she returns to her private home from her current rehabilitation facility. She will need guidance. She will also need help with regular medication intake and her doctor's appointments she is certainly not able to drive and may not be from many month. I like for her to be physically active she has a slight increased risk of falling due to the left-sided weakness. But walking and company should be a good exercise for her. In addition I will order a transcranial Doppler study to look if any vessels spasm is still imminent. I also like to see Mrs. Holten every 6 weeks for a new Mini-Mental Status Examination. Again my expected returned to a baseline will be end of June 2016. I will also order in addition at [facility] thyroid-stimulating hormone and we'll asked the patient to take a multivitamin. Rv in 6 weeks.    The patient was advised of the nature of the diagnosed sleep disorder , the treatment options and risks for general a health and wellness arising from not treating the condition. Visit duration was 60 minutes.   Plan:  Treatment plan and additional workup : more than 50% of this visit was spent in discussion with the patient and her daughter about the prognosis of a brain organic syndrome, post subarachnoid bleed.      Asencion Partridge Jevan Gaunt MD  06/07/2014

## 2014-06-08 DIAGNOSIS — R197 Diarrhea, unspecified: Secondary | ICD-10-CM | POA: Diagnosis not present

## 2014-06-09 DIAGNOSIS — I69098 Other sequelae following nontraumatic subarachnoid hemorrhage: Secondary | ICD-10-CM | POA: Diagnosis not present

## 2014-06-09 DIAGNOSIS — F419 Anxiety disorder, unspecified: Secondary | ICD-10-CM | POA: Diagnosis not present

## 2014-06-09 DIAGNOSIS — G919 Hydrocephalus, unspecified: Secondary | ICD-10-CM | POA: Diagnosis not present

## 2014-06-09 DIAGNOSIS — Z982 Presence of cerebrospinal fluid drainage device: Secondary | ICD-10-CM | POA: Diagnosis not present

## 2014-06-09 DIAGNOSIS — I6901 Cognitive deficits following nontraumatic subarachnoid hemorrhage: Secondary | ICD-10-CM | POA: Diagnosis not present

## 2014-06-09 DIAGNOSIS — M6281 Muscle weakness (generalized): Secondary | ICD-10-CM | POA: Diagnosis not present

## 2014-06-10 DIAGNOSIS — M6281 Muscle weakness (generalized): Secondary | ICD-10-CM | POA: Diagnosis not present

## 2014-06-10 DIAGNOSIS — Z982 Presence of cerebrospinal fluid drainage device: Secondary | ICD-10-CM | POA: Diagnosis not present

## 2014-06-10 DIAGNOSIS — I6901 Cognitive deficits following nontraumatic subarachnoid hemorrhage: Secondary | ICD-10-CM | POA: Diagnosis not present

## 2014-06-10 DIAGNOSIS — G919 Hydrocephalus, unspecified: Secondary | ICD-10-CM | POA: Diagnosis not present

## 2014-06-10 DIAGNOSIS — I69098 Other sequelae following nontraumatic subarachnoid hemorrhage: Secondary | ICD-10-CM | POA: Diagnosis not present

## 2014-06-10 DIAGNOSIS — F419 Anxiety disorder, unspecified: Secondary | ICD-10-CM | POA: Diagnosis not present

## 2014-06-11 DIAGNOSIS — F419 Anxiety disorder, unspecified: Secondary | ICD-10-CM | POA: Diagnosis not present

## 2014-06-11 DIAGNOSIS — Z982 Presence of cerebrospinal fluid drainage device: Secondary | ICD-10-CM | POA: Diagnosis not present

## 2014-06-11 DIAGNOSIS — I6901 Cognitive deficits following nontraumatic subarachnoid hemorrhage: Secondary | ICD-10-CM | POA: Diagnosis not present

## 2014-06-11 DIAGNOSIS — G919 Hydrocephalus, unspecified: Secondary | ICD-10-CM | POA: Diagnosis not present

## 2014-06-11 DIAGNOSIS — I69098 Other sequelae following nontraumatic subarachnoid hemorrhage: Secondary | ICD-10-CM | POA: Diagnosis not present

## 2014-06-11 DIAGNOSIS — M6281 Muscle weakness (generalized): Secondary | ICD-10-CM | POA: Diagnosis not present

## 2014-06-14 ENCOUNTER — Telehealth: Payer: Self-pay | Admitting: Neurology

## 2014-06-14 NOTE — Telephone Encounter (Signed)
I called and spoke to caregiver, who lives with pt Sharon [name].  I answered his questions relating to Ritalin.  (it is not addictive).  Kids take for ADD.  It is used to help with focus, she is taking aricept for OBS.  He is asking due to son's concern about being on this drug.  It is a controlled substance, and needs to be picked up every thirty days (prescription).

## 2014-06-14 NOTE — Telephone Encounter (Signed)
Patient 's caregiver is calling in regard to possibleRx for Retilin.  Family is very hesitant to give to [facility] so caregiver would like to discuss so that he has information to give to family. Please call caregiver.  Thanks!Marland Kitchen

## 2014-06-15 NOTE — Telephone Encounter (Signed)
I called and spoke to Mr. Sharon [name] to clarify that Ritalin can be potentially addictive if abused.  Take as ordered by MD.  He verbalized understanding.

## 2014-06-16 DIAGNOSIS — G919 Hydrocephalus, unspecified: Secondary | ICD-10-CM | POA: Diagnosis not present

## 2014-06-16 DIAGNOSIS — F419 Anxiety disorder, unspecified: Secondary | ICD-10-CM | POA: Diagnosis not present

## 2014-06-16 DIAGNOSIS — I6901 Cognitive deficits following nontraumatic subarachnoid hemorrhage: Secondary | ICD-10-CM | POA: Diagnosis not present

## 2014-06-16 DIAGNOSIS — I69098 Other sequelae following nontraumatic subarachnoid hemorrhage: Secondary | ICD-10-CM | POA: Diagnosis not present

## 2014-06-16 DIAGNOSIS — Z982 Presence of cerebrospinal fluid drainage device: Secondary | ICD-10-CM | POA: Diagnosis not present

## 2014-06-16 DIAGNOSIS — M6281 Muscle weakness (generalized): Secondary | ICD-10-CM | POA: Diagnosis not present

## 2014-06-18 DIAGNOSIS — G919 Hydrocephalus, unspecified: Secondary | ICD-10-CM | POA: Diagnosis not present

## 2014-06-18 DIAGNOSIS — M6281 Muscle weakness (generalized): Secondary | ICD-10-CM | POA: Diagnosis not present

## 2014-06-18 DIAGNOSIS — F419 Anxiety disorder, unspecified: Secondary | ICD-10-CM | POA: Diagnosis not present

## 2014-06-18 DIAGNOSIS — I69098 Other sequelae following nontraumatic subarachnoid hemorrhage: Secondary | ICD-10-CM | POA: Diagnosis not present

## 2014-06-18 DIAGNOSIS — I6901 Cognitive deficits following nontraumatic subarachnoid hemorrhage: Secondary | ICD-10-CM | POA: Diagnosis not present

## 2014-06-18 DIAGNOSIS — Z982 Presence of cerebrospinal fluid drainage device: Secondary | ICD-10-CM | POA: Diagnosis not present

## 2014-06-23 DIAGNOSIS — F419 Anxiety disorder, unspecified: Secondary | ICD-10-CM | POA: Diagnosis not present

## 2014-06-23 DIAGNOSIS — I69098 Other sequelae following nontraumatic subarachnoid hemorrhage: Secondary | ICD-10-CM | POA: Diagnosis not present

## 2014-06-23 DIAGNOSIS — G919 Hydrocephalus, unspecified: Secondary | ICD-10-CM | POA: Diagnosis not present

## 2014-06-23 DIAGNOSIS — M6281 Muscle weakness (generalized): Secondary | ICD-10-CM | POA: Diagnosis not present

## 2014-06-23 DIAGNOSIS — Z982 Presence of cerebrospinal fluid drainage device: Secondary | ICD-10-CM | POA: Diagnosis not present

## 2014-06-23 DIAGNOSIS — I6901 Cognitive deficits following nontraumatic subarachnoid hemorrhage: Secondary | ICD-10-CM | POA: Diagnosis not present

## 2014-06-29 ENCOUNTER — Other Ambulatory Visit: Payer: Self-pay | Admitting: Neurology

## 2014-06-29 NOTE — Telephone Encounter (Signed)
Current dose is 10mg  per last OV on 04/04

## 2014-07-04 ENCOUNTER — Emergency Department (HOSPITAL_COMMUNITY)
Admission: EM | Admit: 2014-07-04 | Discharge: 2014-07-04 | Disposition: A | Discharge status: Home / Self Care | Payer: Medicare Other | Attending: Emergency Medicine | Admitting: Emergency Medicine

## 2014-07-04 ENCOUNTER — Encounter (HOSPITAL_COMMUNITY): Payer: Self-pay

## 2014-07-04 DIAGNOSIS — E785 Hyperlipidemia, unspecified: Secondary | ICD-10-CM | POA: Diagnosis not present

## 2014-07-04 DIAGNOSIS — F329 Major depressive disorder, single episode, unspecified: Secondary | ICD-10-CM

## 2014-07-04 DIAGNOSIS — Z8739 Personal history of other diseases of the musculoskeletal system and connective tissue: Secondary | ICD-10-CM | POA: Diagnosis not present

## 2014-07-04 DIAGNOSIS — F1010 Alcohol abuse, uncomplicated: Secondary | ICD-10-CM | POA: Diagnosis present

## 2014-07-04 DIAGNOSIS — Z720 Tobacco use: Secondary | ICD-10-CM | POA: Insufficient documentation

## 2014-07-04 DIAGNOSIS — Z87448 Personal history of other diseases of urinary system: Secondary | ICD-10-CM | POA: Insufficient documentation

## 2014-07-04 DIAGNOSIS — F32A Depression, unspecified: Secondary | ICD-10-CM

## 2014-07-04 DIAGNOSIS — Z87440 Personal history of urinary (tract) infections: Secondary | ICD-10-CM | POA: Insufficient documentation

## 2014-07-04 DIAGNOSIS — Z79899 Other long term (current) drug therapy: Secondary | ICD-10-CM | POA: Insufficient documentation

## 2014-07-04 LAB — COMPREHENSIVE METABOLIC PANEL
ALT: 19 U/L (ref 14–54)
ANION GAP: 12 (ref 5–15)
AST: 32 U/L (ref 15–41)
Albumin: 3.8 g/dL (ref 3.5–5.0)
Alkaline Phosphatase: 61 U/L (ref 38–126)
BUN: 11 mg/dL (ref 6–20)
CALCIUM: 9.2 mg/dL (ref 8.9–10.3)
CO2: 25 mmol/L (ref 22–32)
Chloride: 94 mmol/L — ABNORMAL LOW (ref 101–111)
Creatinine, Ser: 0.94 mg/dL (ref 0.44–1.00)
GFR, EST NON AFRICAN AMERICAN: 57 mL/min — AB (ref >60)
GLUCOSE: 96 mg/dL (ref 70–99)
Potassium: 4.1 mmol/L (ref 3.5–5.1)
SODIUM: 131 mmol/L — AB (ref 135–145)
Total Bilirubin: 0.5 mg/dL (ref 0.3–1.2)
Total Protein: 6.7 g/dL (ref 6.5–8.1)

## 2014-07-04 LAB — CBC
HCT: 42.2 % (ref 36.0–46.0)
Hemoglobin: 14.3 g/dL (ref 12.0–15.0)
MCH: 31.5 pg (ref 26.0–34.0)
MCHC: 33.9 g/dL (ref 30.0–36.0)
MCV: 93 fL (ref 78.0–100.0)
PLATELETS: 292 10*3/uL (ref 150–400)
RBC: 4.54 MIL/uL (ref 3.87–5.11)
RDW: 13.2 % (ref 11.5–15.5)
WBC: 13.5 10*3/uL — ABNORMAL HIGH (ref 4.0–10.5)

## 2014-07-04 LAB — RAPID URINE DRUG SCREEN, HOSP PERFORMED
AMPHETAMINES: NOT DETECTED
Barbiturates: NOT DETECTED
Benzodiazepines: NOT DETECTED
Cocaine: NOT DETECTED
OPIATES: NOT DETECTED
TETRAHYDROCANNABINOL: NOT DETECTED

## 2014-07-04 LAB — ACETAMINOPHEN LEVEL

## 2014-07-04 LAB — SALICYLATE LEVEL: Salicylate Lvl: <4 mg/dL (ref 2.8–30.0)

## 2014-07-04 LAB — ETHANOL: Alcohol, Ethyl (B): <5 mg/dL (ref <5)

## 2014-07-04 MED ORDER — NICOTINE 21 MG/24HR TD PT24
21.0000 mg | MEDICATED_PATCH | Freq: Once | TRANSDERMAL | Status: DC
  Administered 2014-07-04: 21 mg via TRANSDERMAL
  Filled 2014-07-04: qty 1

## 2014-07-04 NOTE — BH Assessment (Signed)
Clinician was informed by Pod D RN that tele-assessment machine was in the room with another patient who needs to be seen by psych consult. Clinician informed Sharon Purpura, NP who will see other patient first. Once that assessment is completed clinician will see patient via tele-assessment.   Rigoberto Noel, MSW, LCSW Triage Specialist [phone],

## 2014-07-04 NOTE — ED Notes (Signed)
Pt reports 2 weeks ago started drinking alcohol after being sober 38 years.  Pt reports has had several beers and grandson and daughter reports pt has drank distilled vinegar and listerine in the past several days.  Sharon [name] and daughter wants pt admitted to rehab facility, they express concerns about being discharged to her home.  Family states pt is no longer to live alone.

## 2014-07-04 NOTE — BH Assessment (Signed)
Clinician consulted with Heloise Purpura, NP who recommends inpatient on gero-psych unit due to increased depression and suicidal ideation. Clinician provided updates to Frankey Poot, [place] who stated he would report to EDP. TTS will refer to alternative facilities.  Rigoberto Noel, MSW, LCSW Triage Specialist [phone],

## 2014-07-04 NOTE — Discharge Instructions (Signed)
°Emergency Department Resource Guide °1) Find a Doctor and Pay Out of Pocket °Although you won't have to find out who is covered by your insurance plan, it is a good idea to ask around and get recommendations. You will then need to call the office and see if the doctor you have chosen will accept you as a new patient and what types of options they offer for patients who are self-pay. Some doctors offer discounts or will set up payment plans for their patients who do not have insurance, but you will need to ask so you aren't surprised when you get to your appointment. ° °2) Contact Your Local Health Department °Not all health departments have doctors that can see patients for sick visits, but many do, so it is worth a call to see if yours does. If you don't know where your local health department is, you can check in your phone book. The CDC also has a tool to help you locate your state's health department, and many state websites also have listings of all of their local health departments. ° °3) Find a Walk-in Clinic °If your illness is not likely to be very severe or complicated, you may want to try a walk in clinic. These are popping up all over the country in pharmacies, drugstores, and shopping centers. They're usually staffed by nurse practitioners or physician assistants that have been trained to treat common illnesses and complaints. They're usually fairly quick and inexpensive. However, if you have serious medical issues or chronic medical problems, these are probably not your best option. ° °No Primary Care Doctor: °- Call [facility] at  832-8000 - they can help you locate a primary care doctor that  accepts your insurance, provides certain services, etc. °- Physician Referral Service- 1-800-533-3463 ° °Chronic Pain Problems: °Organization         Address  Phone   Notes  °[facility]  (336) 297-2271 Patients need to be referred by their primary care doctor.  ° °Medication  Assistance: °Organization         Address  Phone   Notes  °[place] [facility] [address] °[place], [place] [address] (336) 641-8030 --Must be a resident of [place] °-- Must have NO insurance coverage whatsoever (no Medicaid/ Medicare, etc.) °-- The pt. MUST have a primary care doctor that directs their care regularly and follows them in the community °  °[facility]  (866) 331-1348   °[employer]  (888) 892-1162   ° °Agencies that provide inexpensive medical care: °Organization         Address  Phone   Notes  °[facility]  (336) 832-8035   °[facility]    (336) 832-7272   °[facility] [address] °[place],  [address] (336) 832-4777   °[facility] [address], °[place] (336) 271-4999   °[facility]    (336) 373-0678   °[facility]    (336) 272-1050   °[facility] ° [address], [place] Phone:  (336) 832-4444, Fax:  (336) 832-4440 Hours of Operation:  9 am - 6 pm, M-F.  Also accepts Medicaid/Medicare and self-pay.  °[facility] ° [address], [place] Phone: (336) 832-3150, Fax: (336) 832-3151. Hours of Operation:  8:30 am - 5:30 pm, M-F.  Also accepts Medicaid and self-pay.  °[facility] [place] [address], [place] Phone: (336) 878-6027   °[facility] [address], [place], [place] (336)723-1848, Ext. 123 Mondays & Thursdays: 7-9 AM.  First 15 patients are seen on a first come, first serve basis. °  ° °Medicaid-accepting [place] Providers: ° °Organization         Address  Phone   Notes  °[facility] [address], [place] (336) 641-2100 Also accepts self-pay patients.  °[facility] [address], [place] ° (336) 856-9996   °[facility] [address], [place]  (336) 288-8857   °[facility] [address] [place] [address], [place] (336) 299-7000   °Veita Bland [address], [place]  ° (336) 373-1557 Only accepts [place] Access Medicaid patients after they have their name applied to their card.  ° °Self-Pay (no insurance) in [place]: ° °Organization         Address  Phone   Notes  °Sickle Cell Patients, [facility] [address], [place] (336) 832-1970   °[facility] [address], [place] (336) 832-4400   ° [facility] ° [address],  (336) 992-4800   °[facility]/Dr. Osei-Bonsu ° [address] [place] [address], [place] or [address], [place] (336) 841-8500 Phone number for both [place] and [place] locations is the same.  °[facility] [address], [place] (336) 299-0000   °[facility] [address] [place] [address], [place] or [address] (336) 852-7530 °(336) 878-2260   °[facility] [address], [place] (336) 350-1642, phone; (336) 294-5005, fax Sees patients 1st and 3rd Saturday of every month.  Must not qualify for public or private insurance (i.e. Medicaid, Medicare, [place] Health Choice, Veterans' Benefits) • Household income should be no more than 200% of the poverty level •The clinic cannot treat you if you are pregnant or think you are pregnant • Sexually transmitted diseases are not treated at the clinic.  ° ° °Dental Care: °Organization         Address  Phone  Notes  °[place] [facility] [address], [place] (336) 641-6152 Accepts children up to age 21 who are enrolled in Medicaid or [place] Health Choice; pregnant women with a Medicaid card; and children who have applied for Medicaid or [place] Health Choice, but were declined, whose parents can pay a reduced fee at time of service.  °[place]  [facility] [place]  [address], [place] (336) 641-7733 Accepts children up to age 21 who are enrolled in Medicaid or  Health Choice; pregnant women with a Medicaid card; and children who have applied for Medicaid or  Health Choice, but were declined, whose parents can pay a reduced fee at time of service.  °[facility] ° [address],  (336) 641-4533 Patients are seen by appointment only. Walk-ins are not accepted. [facility] will see patients 18 years of age and older. °Monday - Tuesday (8am-5pm) °Most Wednesdays (8:30-5pm) °$30 per visit, cash only  °[facility] ° [address], [place] (336) 641-4533 Patients are seen by appointment only. Walk-ins are not accepted. [facility] will see patients 18 years of age and older. °One   Wednesday Evening (Monthly: Volunteer Based).  $30 per visit, cash only  °[facility]  (919) 537-3737 for adults; Children under age 4, call Graduate Pediatric Dentistry at ([phone]) [phone]. Children aged 4-14, please call (919) [phone] to request a pediatric application. ° Dental services are provided in all areas of dental care including fillings, crowns and bridges, complete and partial dentures, implants, gum treatment, root canals, and extractions. Preventive care is also provided. Treatment is provided to both adults and children. °Patients are selected via a lottery and there is often a waiting list. °  °[facility] [address], °[place] ° (336) 763-8833 [email or web address] °  °[facility] [address], [place], [place] (336)723-1848, Ext. 123 Second and Fourth Thursday of each month, opens at 6:30 AM; Clinic ends at 9 AM.  Patients are seen on a first-come first-served basis, and a limited number are seen during each clinic.  ° °[facility] ° [address], [place], [place] (336) 723-7904    Eligibility Requirements °You must have lived in [place], [place], or [place] counties for at least the last three months. °  You cannot be eligible for state or federal sponsored healthcare insurance, including Veterans Administration, Medicaid, or Medicare. °  You generally cannot be eligible for healthcare insurance through your employer.  °  How to apply: °Eligibility screenings are held every Tuesday and Wednesday afternoon from 1:00 pm until 4:00 pm. You do not need an appointment for the interview!  °[facility] [address], [place], [place] 336-631-2330   °[facility]  336-342-8273   °[facility]  336-703-3100   °[facility]  336-570-6415   ° °Behavioral Health Resources in the Community: °Intensive Outpatient Programs °Organization         Address  Phone  Notes  °[facility] [address], [place], [place] 336-878-6098   °[facility] [address], [place], [place] 336-832-9800   °[facility] [address], [place], [place] ° 336-882-2125   °[facility] [address],  °[place], [place] 1-800-853-5163 or 336-641-4981   °Substance Abuse Resources °Organization         Address  Phone  Notes  °[facility]  336-882-2125   °[facility]  336-784-9470   °[facility]  336-285-9073   °[facility]  336-845-3988   °Residential & Outpatient Substance Abuse Program  1-800-659-3381   °Psychological Services °Organization         Address  Phone  Notes  °[facility]  336- 832-9600   °[facility]  336- 378-7881   °[facility] [address], [place] 1-800-853-5163 or 336-641-4981   ° °Mobile Crisis Teams °Organization         Address  Phone  Notes  °[facility], Mobile Crisis Care Unit  1-877-626-1772   °Assertive °[facility] ° [address]  [place], [place] 336-834-9664   °Sharon DeEsch [address] °[place] 336-554-5454   ° °Self-Help/Support Groups °Organization         Address  Phone             Notes  °[facility] - variety of support groups  336- 373-1402 Call for more information  °Narcotics Anonymous (NA), [facility] [address], °[place]  2 meetings at this location  ° °  Residential Treatment Programs °Organization         Address  Phone  Notes  °[facility] [address],    °[place]  1-866-801-8205   °[facility] ° [address], [place], [place] 704-293-8524   °[facility] [address], [place] 336-845-3988 Admissions: 8am-3pm M-F  °[facility] [address]    °[place], [place] 336-841-1104   °[facility] [address], [place], [place] 336-379-7146   °[facility] [address]  °[place], [place] 336-285-9073   °[facility] - Intensive Outpatient [address], [place], [place] 336-852-3033   °[facility] [address]  °[place], [place] 1-877-615-2722 or 336-784-9470   °[facility] [address] [place], [place] 336-227-7417 Accepts Medicaid  °[facility] [address]  °[place] 1-800-659-3381 Substance Abuse/Addiction Treatment  ° °[place] Behavioral Health Resources °Organization         Address  Phone  Notes  °[facility]  (888) 581-9988   °Julie Brannon, PhD [address] [place], [place]   (336) 349-5553 or (336) 951-0000   °[facility]   [address] °[place], [place] (336) 349-4454   °[facility] [address], [place], [place] (336) 342-8316 Insurance/Medicaid/sponsorship through [facility]  °[facility] [address]                                    [place], [place] (336) 342-8316 Therapy/tele-psych/case    °[facility] [address]  ° [place],  (336) 349-2233    °Dr. Arfeen  (336) 349-4544   °[facility] [place]  [employer] [place] [facility] 1) [address], [place] °2) [address], [place] °3)  [address], [place] (336) 349-3220 °(336) 342-7768 ° °(336) 342-8140   °[place] [facility] (336) 342-1394 or (336) 342-3537 (After Hours)    ° ° °

## 2014-07-04 NOTE — ED Provider Notes (Signed)
CSN: [id number]     Arrival date & time 07/04/14  1154 History   First MD Initiated Contact with Patient 07/04/14 1308     Chief Complaint  Patient presents with  . Alcohol Problem     (Consider location/radiation/quality/duration/timing/severity/associated sxs/prior Treatment) HPI   PCP: Osborne Casco, MD Blood pressure 119/76, pulse 80, temperature 97.3 F (36.3 C), temperature source Oral, resp. rate 16, SpO2 99 %.  Sharon [name] is a 80 y.o.female with a significant PMH of dyslipidemia, cystitis, UTI, arthritis presents to the ER with complaints of wanting alcohol detox. She has been sober for 38 years and recently had an aneurysm which has caused a lot of stress and a decrease in her dependency, so she has started to drink alcohol again. She was recently discharge from a rehab facility and had a caregiver. The caregiver recently relinquished her duties and now no one is there to take care of the patient anymore. Now the grandson has brought her in and says there is no one to take care of her, she keeps leaving the home in the middle of the night to get alcohol. She will buy beer and bring it home, she has been drinking basalmic vinegar. The family members say that she is expressing hopeless thoughts. The patient wants to be here, she wants help for drinking and her depression. She admits to suicidal thoughts.  Negative Review of Symptoms: suicidal attempt, headaches, CP, SOB, lower extremity swelling, change in vision, weakness, confusion.   Past Medical History  Diagnosis Date  . Dyslipidemia   . Cystitis 03/02/2014    acute   . UTI (urinary tract infection) 03/02/2014  . Arthritis     osteo   Past Surgical History  Procedure Laterality Date  . Abdominal hysterectomy    . Radiology with anesthesia N/A 03/04/2014    Procedure: RADIOLOGY WITH ANESTHESIA;  Surgeon: Rob Hickman, MD;  Location: [facility];  Service: Radiology;  Laterality: N/A;  .  Ventriculoperitoneal shunt Right 03/15/2014    Procedure: SHUNT INSERTION VENTRICULAR-PERITONEAL;  Surgeon: Charlie Pitter, MD;  Location: [facility];  Service: Neurosurgery;  Laterality: Right;  . Radiology with anesthesia N/A 03/04/2014    Procedure: RADIOLOGY WITH ANESTHESIA;  Surgeon: Medication Radiologist, MD;  Location: [facility];  Service: Radiology;  Laterality: N/A;   Family History  Problem Relation Age of Onset  . Hypertension Mother   . Hypertension Father    History  Substance Use Topics  . Smoking status: Heavy Tobacco Smoker -- 0.50 packs/day    Types: Cigarettes    Last Attempt to Quit: 02/28/2014  . Smokeless tobacco: Never Used  . Alcohol Use: 0.0 oz/week    0 Standard drinks or equivalent per week     Comment: recovering alcoholic- started drinking 2 weeks ago (07-04-14)   OB History    No data available     Review of Systems  10 Systems reviewed and are negative for acute change except as noted in the HPI.   Allergies  Review of patient's allergies indicates no known allergies.  Home Medications   Prior to Admission medications   Medication Sig Start Date End Date Taking? Authorizing Provider  cholecalciferol (VITAMIN D) 1000 UNITS tablet Take 1,000 Units by mouth daily.   Yes Historical Provider, MD  donepezil (ARICEPT) 10 MG tablet Take 1 tablet (10 mg total) by mouth at bedtime. 06/07/14  Yes Asencion Partridge Dohmeier, MD  glucosamine-chondroitin 500-400 MG tablet Take 1 tablet by mouth daily.  Yes Historical Provider, MD  NON FORMULARY Bone injection/ once every 6 mo Bone health tablet once monthly.   Yes Historical Provider, MD  Prenatal Vit-Fe Fumarate-FA (PRENATAL MULTIVITAMIN) TABS tablet Take 1 tablet by mouth daily at 12 noon.   Yes Historical Provider, MD  Propylene Glycol 0.6 % SOLN Apply 1 drop to eye daily as needed (dry eyes).   Yes Historical Provider, MD  RENOVA 0.02 % CREA Apply 1 application topically at bedtime.  01/12/14  Yes Historical Provider, MD   simvastatin (ZOCOR) 20 MG tablet Take 1 tablet by mouth daily. 02/07/14  Yes Historical Provider, MD  vitamin E 100 UNIT capsule Take 100 Units by mouth daily.   Yes Historical Provider, MD  ondansetron (ZOFRAN ODT) 4 MG disintegrating tablet Take 1 tablet (4 mg total) by mouth every 8 (eight) hours as needed for nausea or vomiting. Patient not taking: Reported on 07/04/2014 10/11/13   Sherian Maroon, MD   BP 106/51 mmHg  Pulse 87  Temp(Src) 97.3 F (36.3 C) (Oral)  Resp 16  SpO2 95% Physical Exam  Constitutional: She appears well-developed and well-nourished. No distress.  HENT:  Head: Normocephalic and atraumatic.  Eyes: Pupils are equal, round, and reactive to light.  Neck: Normal range of motion. Neck supple.  Cardiovascular: Normal rate and regular rhythm.   Pulmonary/Chest: Effort normal.  Abdominal: Soft.  Neurological: She is alert.  Skin: Skin is warm and dry.  Psychiatric: She is not withdrawn and not actively hallucinating. She exhibits a depressed mood. She expresses no homicidal and no suicidal ideation. She expresses no suicidal plans and no homicidal plans.  + thoughts of hopelessness.  Nursing note and vitals reviewed.   ED Course  Procedures (including critical care time) Labs Review Labs Reviewed  ACETAMINOPHEN LEVEL - Abnormal; Notable for the following:    Acetaminophen (Tylenol), Serum <10 (*)    All other components within normal limits  CBC - Abnormal; Notable for the following:    WBC 13.5 (*)    All other components within normal limits  COMPREHENSIVE METABOLIC PANEL - Abnormal; Notable for the following:    Sodium 131 (*)    Chloride 94 (*)    GFR calc non Af Amer 57 (*)    All other components within normal limits  ETHANOL  SALICYLATE LEVEL  URINE RAPID DRUG SCREEN (HOSP PERFORMED)    Imaging Review No results found.   EKG Interpretation None      MDM   Final diagnoses:  Alcohol abuse  Depression    TTS has evaluated patient and plans  to discuss with on-call psych midlevel provider. I do not believe that she will meet criteria for inpatient ETOH detox facility. The patient however, medically does need supervision. I spoke with Frankey Poot with Social Work. If psych does not believe they can place the patient they will offer either a sitter or a in home health nurse to help until she can be placed. She does not meet any criteria for medical admission.    Dr. Venora Maples the oncoming physician aware of plan for the patient and will follow-up on psych/social work decisions. Dr. Ashok Cordia has seen the patient as well and agrees with course of action.  Filed Vitals:   07/04/14 1400  BP: 106/51  Pulse: 87  Temp:   Resp:        Delos Haring, PA-C 07/04/14 [place], MD 07/04/14 [phone]

## 2014-07-04 NOTE — ED Notes (Signed)
Pt here for possible placement for rehabilitation. Pt and pt family report that after a recent aneurysm she has become less active and has been sneaking out of the house to drink and buy beers in the middle of the night, and hiding alcohol throughout the house. Pt family reports once they took the alcohol away, pt started drinking Listerine. After taking the Listerine away, pt started drinking distilled vinegar. Pt family concerned patient will start drinking Clorox and eventually kill herself. Pt has stated "she does not want to be here anymore" but denies suicidal ideation. Pt is calm and cooperative and states "she wants help because if she had alcohol in front of her she would drink it." Pt family also wonders if after aneurysm she possibly cannot remember she is not supposed to be drinking alcohol.

## 2014-07-04 NOTE — ED Notes (Signed)
Family requesting for pt to be discharged.  States they spoke with Frankey Poot, CSW and have decided to be discharged home.  Frankey Poot, CSW notified of same and notified Dr. Venora Maples.

## 2014-07-04 NOTE — Progress Notes (Signed)
CSW met with this 80 y/o, Caucasian, female that presents in hospital garb along with her daughter and grandson.  CSW gave the family resources such as substance abuse levels of care and private duty care agencies(to replace the caretaker).  CSW discussed the difference between SNF and ALF.  CSW gave the family resources in case the disposition was home.  CSW offered supportive counseling and discussed how the relapse could be due to the aneurysm or the following depression, or due to "the disease of alcoholism."  CSW let the family know that psych had not determined their recommendation for disposition at this time.  After leaving the family TTS stated that psych had reccommended Geri-psych placement, but the family had in the meantime decided to go home.  CSW alerted the EDP who stated he would consult with the family.  Family is now deciding Geri-Psych versus discharging home and following up at [facility].  [facility] Sharon [name] Sharon [name] ED CSW [phone]

## 2014-07-04 NOTE — BH Assessment (Signed)
Clinician contact Dr. Ashok Cordia for report prior to assessing patient.  Per MD, patient presenting with alcohol abuse and reports feeling hopeless.  Assessment will be initiated.   Rigoberto Noel, MSW, LCSW Triage Specialist [phone],

## 2014-07-04 NOTE — BH Assessment (Addendum)
Tele Assessment Note   Sharon [name] is an 80 y.o. female who presents to [facility] accompanied by her daughter Legrand Rams with concerns of patient's increasing alcohol use after being sober for almost 30 years. Patient had brain aneurysm 03/05/15.  Since aneurysm occurred patient lost some memory and although patient Ox4 and per daughter has increased memory since, family concerned that patient has difficulty with remembering that she is not supposed to drink alcohol due to alcohol abuse issues in the past. Patient presented with hearing difficulty during assessment. Patient was able to refer to date, situation, place and person, patient had difficulty recalling last drink in which she stated 3 weeks ago and daughter indicated it was 3 days ago. Daughter unaware of amount used last. Family removed alcohol from the home and per daughter patient began to drink Listerine (althougth patient denied) and then balasmic vinegar which she ingested yesterday. Daughter indicated in previous notes that she stated she was worried about patient drinking Clorox next. When prompted daughter stated she was exaggerating and recanted statement of concerns of her drinking Clorox but concerned with her drinking other items that have alcohol in it.  When prompted about SI, patient admitted to [facility] for "a while" but stated no previous attempts nor plan. 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 Patient endorses depression sx such as despondent, hopelessness, insomnia and reduced appetite. Patient denied w/d sx but per daughter patient has been shaking and reporting stomach in "knots." Daughter concerned patient is experiencing w/D sx. Patient denies HI and AVH. Patient reported being in rehab in 1970s for alcohol abuse and denies any other inpatient tx. Patient denies current outpatient tx.    Collateral gathered from daughter Legrand Rams reports she thinks her mother forgot she could not drink  after aneurysm. Legrand Rams reports patient was healthy and did not have relapse since the 70s prior to current incident. Legrand Rams reported family has to be around patient "24/7" to make sure she is not drinking. Patient stated she is interested in rehab for alcohol abuse. Patient BAL less than 5. Legrand Rams reports brother contacted facilities willing to accept patient once medically cleared from ED.   Axis I: Alcohol Use Disorder, moderate            Adjustment Disorder  Past Medical History:  Past Medical History  Diagnosis Date  . Dyslipidemia   . Cystitis 03/02/2014    acute   . UTI (urinary tract infection) 03/02/2014  . Arthritis     osteo    Past Surgical History  Procedure Laterality Date  . Abdominal hysterectomy    . Radiology with anesthesia N/A 03/04/2014    Procedure: RADIOLOGY WITH ANESTHESIA;  Surgeon: Rob Hickman, MD;  Location: [place];  Service: Radiology;  Laterality: N/A;  . Ventriculoperitoneal shunt Right 03/15/2014    Procedure: SHUNT INSERTION VENTRICULAR-PERITONEAL;  Surgeon: Charlie Pitter, MD;  Location: [place] [facility];  Service: Neurosurgery;  Laterality: Right;  . Radiology with anesthesia N/A 03/04/2014    Procedure: RADIOLOGY WITH ANESTHESIA;  Surgeon: Medication Radiologist, MD;  Location: [place];  Service: Radiology;  Laterality: N/A;    Family History:  Family History  Problem Relation Age of Onset  . Hypertension Mother   . Hypertension Father     Social History:  reports that she has been smoking Cigarettes.  She has been smoking about 0.50 packs per day. She has never used smokeless tobacco. She reports that she drinks alcohol.  She reports that she does not use illicit drugs.  Additional Social History:     CIWA: CIWA-Ar BP: (!) 106/51 mmHg Pulse Rate: 87 COWS:    PATIENT STRENGTHS: (choose at least two) Ability for insight Motivation for treatment/growth Supportive family/friends Work skills  Allergies: No Known Allergies  Home Medications:   (Not in a hospital admission)  OB/GYN Status:  No LMP recorded. Patient has had a hysterectomy.  General Assessment Data Location of Assessment: [facility] ED TTS Assessment: In system Is this a Tele or Face-to-Face Assessment?: Tele Assessment Is this an Initial Assessment or a Re-assessment for this encounter?: Initial Assessment Marital status: Single Maiden name: NA Is patient pregnant?: No Pregnancy Status: Unknown Living Arrangements: Alone Can pt return to current living arrangement?: Yes Admission Status: Voluntary Is patient capable of signing voluntary admission?: Yes Referral Source: Self/Family/Friend Insurance type: Medicare     Crisis Care Plan Living Arrangements: Alone Name of Psychiatrist: None Name of Therapist: None     Risk to self with the past 6 months Suicidal Ideation: Yes-Currently Present Has patient been a risk to self within the past 6 months prior to admission? : Yes Suicidal Intent: Yes-Currently Present Has patient had any suicidal intent within the past 6 months prior to admission? : Yes Is patient at risk for suicide?: Yes Suicidal Plan?: No Has patient had any suicidal plan within the past 6 months prior to admission? : No Access to Means: No What has been your use of drugs/alcohol within the last 12 months?:  (alcohol) Previous Attempts/Gestures: No How many times?: 0 Other Self Harm Risks: alcohol abuse Triggers for Past Attempts: None known Intentional Self Injurious Behavior: None Family Suicide History: Unknown Recent stressful life event(s): Other (Comment) (Brain aneurysm) Persecutory voices/beliefs?: No Depression: Yes Depression Symptoms: Despondent, Insomnia, Feeling worthless/self pity Substance abuse history and/or treatment for substance abuse?: Yes  Risk to Others within the past 6 months Homicidal Ideation: No Does patient have any lifetime risk of violence toward others beyond the six months prior to admission? :  Unknown Thoughts of Harm to Others: No Current Homicidal Intent: No Current Homicidal Plan: No Access to Homicidal Means: No Identified Victim: NA History of harm to others?: No Assessment of Violence: None Noted Violent Behavior Description: Patient calm and cooperative at admission. Does patient have access to weapons?: No Criminal Charges Pending?: No Does patient have a court date: No Is patient on probation?: Unknown  Psychosis Hallucinations: None noted Delusions: None noted  Mental Status Report Appearance/Hygiene: In scrubs Eye Contact: Fair Motor Activity: Other (Comment) (shaking) Speech: Logical/coherent Level of Consciousness: Alert Mood: Depressed Affect: Depressed Anxiety Level: Minimal Thought Processes: Coherent, Relevant Judgement: Impaired Orientation: Person, Place, Time, Situation Obsessive Compulsive Thoughts/Behaviors: None  Cognitive Functioning Concentration: Normal Memory: Recent Impaired, Remote Intact IQ: Average Insight: Poor Impulse Control: Poor Appetite: Fair Weight Loss:  (unk) Weight Gain:  (unk) Sleep: Decreased Total Hours of Sleep: 7 Vegetative Symptoms: None  ADLScreening [facility] Assessment Services) Patient's cognitive ability adequate to safely complete daily activities?: Yes Patient able to express need for assistance with ADLs?: Yes Independently performs ADLs?: Yes (appropriate for developmental age)  Prior Inpatient Therapy Prior Inpatient Therapy: Yes Prior Therapy Dates: in the 30s Prior Therapy Facilty/Provider(s): unk Reason for Treatment: alcohol abuse  Prior Outpatient Therapy Prior Outpatient Therapy: No Prior Therapy Dates: NA Prior Therapy Facilty/Provider(s): NA Reason for Treatment: NA Does patient have an ACCT team?: No Does patient have Intensive In-House Services?  : No Does patient  have [facility] services? : No Does patient have P4CC services?: No  ADL Screening (condition at time of  admission) Patient's cognitive ability adequate to safely complete daily activities?: Yes Is the patient deaf or have difficulty hearing?: Yes Does the patient have difficulty seeing, even when wearing glasses/contacts?: No Does the patient have difficulty concentrating, remembering, or making decisions?: Yes Patient able to express need for assistance with ADLs?: Yes Independently performs ADLs?: Yes (appropriate for developmental age)       Abuse/Neglect Assessment (Assessment to be complete while patient is alone) Physical Abuse: Denies Verbal Abuse: Denies Sexual Abuse: Denies Exploitation of patient/patient's resources: Denies Self-Neglect: Denies     Regulatory affairs officer (For Healthcare) Does patient have an advance directive?: No    Additional Information 1:1 In Past 12 Months?: No CIRT Risk: No Elopement Risk: No    Disposition: Pending disposition at this time. Disposition Initial Assessment Completed for this Encounter: Yes Disposition of Patient: Other dispositions Other disposition(s): Other (Comment) Patient referred to: Other (Comment)  Shali Vesey R 07/04/2014 3:26 PM

## 2014-07-04 NOTE — BH Assessment (Signed)
Clinician contacted facilities for Gero-psych bed availability:  [place] beds. Referral faxed. [facility]- no current beds. Will have beds available tomorrow on 5/2. [facility]- no current beds  Rigoberto Noel, MSW, LCSW Triage Specialist [phone],

## 2014-07-04 NOTE — ED Notes (Signed)
PA-C Tiffany at bedside.

## 2014-07-04 NOTE — ED Notes (Signed)
Pt placed in gown and in bed. Pt monitored by pulse ox and bp cuff. 

## 2014-07-07 DIAGNOSIS — B009 Herpesviral infection, unspecified: Secondary | ICD-10-CM | POA: Diagnosis not present

## 2014-07-07 DIAGNOSIS — R197 Diarrhea, unspecified: Secondary | ICD-10-CM | POA: Diagnosis not present

## 2014-07-07 DIAGNOSIS — F17200 Nicotine dependence, unspecified, uncomplicated: Secondary | ICD-10-CM | POA: Diagnosis not present

## 2014-07-07 DIAGNOSIS — F0390 Unspecified dementia without behavioral disturbance: Secondary | ICD-10-CM | POA: Diagnosis not present

## 2014-07-12 ENCOUNTER — Telehealth: Payer: Self-pay | Admitting: Neurology

## 2014-07-12 NOTE — Telephone Encounter (Signed)
Returned [employer]. Legrand Rams reports that her mother has become very "down" lately, not wanting to get out of bed. She has started smoking a lot of cigarettes and even drank beer and some liquor even though she is a recovering alcholic. Pt has had episodes of bowel incontinence. This is concerning her daughter and the daughter has taken her mother to live with her in [place]. Legrand Rams is wanting Dr. Edwena Felty advice on assisted living facilities versus staying in the home and is wondering about antidepressants and/or chantix. Wants to come in on Friday but there is nothing available at this time on Friday. Legrand Rams is ok with speaking to Dr. Brett Fairy over the phone if available.

## 2014-07-12 NOTE — Telephone Encounter (Signed)
Attempted to call Sharon [name] back and the number listed below. This number is not in service any more. Tried the home number for pt, but no answer. Will try [name]'s number again later.

## 2014-07-12 NOTE — Telephone Encounter (Signed)
[name]'s phone number is [phone]

## 2014-07-12 NOTE — Telephone Encounter (Signed)
Patients daughter Legrand Rams [phone]) called and stated that her mother condition has worsened and that her and her sister would really like to bring her mother in this Friday (07/16/14) if at all possible (Her and her sister are both out of town and would like to come in with their mother). She is really concerned because her mother has been very withdrawn recently, having trouble making decisions,and she has been having "bathroom issues" and Legrand Rams would like to speak with someone regarding these issues. Please call and advise.

## 2014-07-13 DIAGNOSIS — H6123 Impacted cerumen, bilateral: Secondary | ICD-10-CM | POA: Diagnosis not present

## 2014-07-13 DIAGNOSIS — H903 Sensorineural hearing loss, bilateral: Secondary | ICD-10-CM | POA: Diagnosis not present

## 2014-07-14 ENCOUNTER — Ambulatory Visit: Payer: PRIVATE HEALTH INSURANCE | Admitting: Physical Medicine & Rehabilitation

## 2014-07-15 NOTE — Telephone Encounter (Signed)
[place] she seems now to be improving again the weekend was titrated. She also had uncontrolled diarrhea and was bowel incontinence which I attributed to the drinking episode. The daughter now has questions if the patient should move into [facility] or if she could return to her private home. They will see Kelton Pillar the primary care physician, on Monday. Dr. Laurann Montana has known the patient for 15 years I think this discussion should be taking place with her and being able to reflect on the patient's development and history. The daughter agreed she was very happy with the conversation.

## 2014-07-28 DIAGNOSIS — G910 Communicating hydrocephalus: Secondary | ICD-10-CM | POA: Diagnosis not present

## 2014-08-16 ENCOUNTER — Encounter: Payer: Self-pay | Admitting: Nurse Practitioner

## 2014-08-16 ENCOUNTER — Ambulatory Visit (INDEPENDENT_AMBULATORY_CARE_PROVIDER_SITE_OTHER): Payer: Medicare Other | Admitting: Nurse Practitioner

## 2014-08-16 VITALS — BP 111/73 | HR 82 | Ht 64.0 in | Wt 118.5 lb

## 2014-08-16 DIAGNOSIS — R413 Other amnesia: Secondary | ICD-10-CM | POA: Insufficient documentation

## 2014-08-16 DIAGNOSIS — R4182 Altered mental status, unspecified: Secondary | ICD-10-CM

## 2014-08-16 DIAGNOSIS — I609 Nontraumatic subarachnoid hemorrhage, unspecified: Secondary | ICD-10-CM | POA: Diagnosis not present

## 2014-08-16 NOTE — Progress Notes (Signed)
[facility]  PATIENT: Sharon [name] DOB: [birth year]   REASON FOR VISIT: History of subarachnoid hemorrhage, memory loss and confusion HISTORY FROM: Patient alone at visit    HISTORY OF PRESENT ILLNESS: Sharon [name], 80 year old female returns for follow-up. She was last seen by Dr. Brett Fairy 06/07/2014. She increased her Aricept to 10 mg daily and she is having some diarrhea from that. She reports that her memory is stable. She no longer has caregiver in the home. She is back to driving and has not had any problems. MOCA test today 26 out of 30. She reports that her appetite is good and she is sleeping well. She is taking yoga .She returns for reevaluation   UPDATE 4-4-16CD The meeting today for a prolonged revisit appointment of 30 minutes. Sharon [name] is here today with a caretaker, Sharon [name], and appears very alert and concentrated. She is reporting depression, but also hope, She has reported diarrhea, and suspects that either her bacterial setting is disturbed from ATB , or she may have contracted clostridium . Be repeated today and MMSE that the patient had already performed the last time at 23 out of 30 points today she scored 27 out of 30 points. She recalled 2 day 2 of the 3 words that she missed last time was able to do the serial sevens completely she was not sure about the season and also the spring she stated it was fall. She was able to copy an object and clock draw a clock face correctly she named 9 animals in the animal fluency test and she wrote a perfect sentence writing. Over 2 to make it a little bit more difficult she scored 25 out of 30 on the much more difficult [place] assessment test.I also have a transcranial Doppler study available here as was normal as flow direction and velocity in all identified vessels in expected sinus no evidence of stenosis basilar spasm or occlusion. The patient fell asleep doing the test and the technician noted that she was  snoring. Her VP shunt performs well. She is much improved.  As she has no longer confusion, she keeps active in conversation and is interested in daily activities. I will ask her to come back in June and likely will give her permission d to drive if her recovery is sustained. I would like her to try the Ritalin that she was prescribed in [facility]. I will increase aricept to 10 mg now form 5 mg.   HISTORY:Sharon [name] and had a very eventful end of the last year . He was suspected to have a UTI causing confusion, and was admitted on 02-28-14 , given multiple doses of ativan - Her agitation got worse. The attempt to do a lumbar puncture was unsuccessful. She needed sedation for an MRI, which did showed an aneurysm. Under the MRI sedation a LP was finally done, the CSF was cloudy. 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 She was coiled by Dr. Estanislado Pandy .  [name]- but a communicating hydrocephalus developed nonetheless, and a VP shunt was placed on Jan. 11th 2016 by Dr. Trenton Gammon. Since the aneurysm bleed occurred the patient also developed a urinary tract infection indeed and 100,000 colonies were seen on 04/11/2014 in her urinary sample that was obtained while she was in rehabilitation at [facility]. The main concern now is that this 80 year old right-handed Caucasian lady has still continued to have some altered mental  status. She is not quite as concentrated and focused and she is repetitive. Quite forgetful, seemed to be : ZEITGITTERSTOERUNGEN " timeframe issues as to what happened last week versus last month was a decade ago.   The patient used to live independently prior to December 2015. She was referred referred from hospital to come in place for rehabilitation per physical and occupational therapy were ongoing. She was placed on the more top to prevent muscle spasms.  Decadron protocol was directed. In generally the patient still had some low-grade fevers that maintained on broad-spectrum antibiotics these stopped. She tends to confabulate during the exam and she has mild left-sided dysmetria and ataxia clearly related to the right-sided lesion. She is easily distracted. She was placed on Ritalin at bedtime to help improve anytime arousal focus attention and issues in regards to mental status. She is developed insomnia which is very common after an aneurysm bleed. She sometimes restless but she is doing much better than she has a visitor that she recognizes and trusts. She is currently on Pepcid, on Ritalin, Zocor at bedtime only. She was on a mechanical soft diet and thin liquids. Diet changed during her [place] rehabilitation stay to a regular diet. I was able to review the patient's recent lab results from 04-13-14 showed a normal CBC and differential her platelet count was 463,000. Her urinalysis confirmed 2 days after the beginning of Macrobid only few bacteria. Hepatic metabolic panel was entirely normal including creatinine and potassium and sodium. I did not see a TSH. However the TSH would unlikely be affected with and only 6 weeks after an aneurysm bleed. I will refer the patient also for transcranial Doppler study to make sure that there is no muscle spasm at the current time. She does not complain about headaches right now. Neither the patient nor her daughter confirm any nausea the last couple of days. She has no neck stiffness.    REVIEW OF SYSTEMS: Full 14 system review of systems performed and notable only for those listed, all others are neg:  Constitutional: neg  Cardiovascular: neg Ear/Nose/Throat: Hearing loss  Skin: neg Eyes: neg Respiratory: neg Gastroitestinal: Diarrhea Hematology/Lymphatic: neg  Endocrine: neg Musculoskeletal:neg Allergy/Immunology: neg Neurological: neg Psychiatric: neg Sleep : neg   ALLERGIES: No Known  Allergies  HOME MEDICATIONS: Outpatient Prescriptions Prior to Visit  Medication Sig Dispense Refill  . cholecalciferol (VITAMIN D) 1000 UNITS tablet Take 1,000 Units by mouth daily.    Marland Kitchen donepezil (ARICEPT) 10 MG tablet Take 1 tablet (10 mg total) by mouth at bedtime. 90 tablet 3  . glucosamine-chondroitin 500-400 MG tablet Take 1 tablet by mouth daily.    . NON FORMULARY Bone injection/ once every 6 mo Bone health tablet once monthly.    . Prenatal Vit-Fe Fumarate-FA (PRENATAL MULTIVITAMIN) TABS tablet Take 1 tablet by mouth daily at 12 noon.    Marland Kitchen RENOVA 0.02 % CREA Apply 1 application topically at bedtime.   3  . simvastatin (ZOCOR) 40 MG tablet Take 40 mg by mouth daily.   3  . vitamin E 100 UNIT capsule Take 100 Units by mouth daily.    . ondansetron (ZOFRAN ODT) 4 MG disintegrating tablet Take 1 tablet (4 mg total) by mouth every 8 (eight) hours as needed for nausea or vomiting. (Patient not taking: Reported on 07/04/2014) 2 tablet 0  . Propylene Glycol 0.6 % SOLN Apply 1 drop to eye daily as needed (dry eyes).     No facility-administered medications prior to visit.  PAST MEDICAL HISTORY: Past Medical History  Diagnosis Date  . Dyslipidemia   . Cystitis 03/02/2014    acute   . UTI (urinary tract infection) 03/02/2014  . Arthritis     osteo  . Hearing loss bil    got hearing aides.     PAST SURGICAL HISTORY: Past Surgical History  Procedure Laterality Date  . Abdominal hysterectomy    . Radiology with anesthesia N/A 03/04/2014    Procedure: RADIOLOGY WITH ANESTHESIA;  Surgeon: Sharon Hickman, MD;  Location: [place];  Service: Radiology;  Laterality: N/A;  . Ventriculoperitoneal shunt Right 03/15/2014    Procedure: SHUNT INSERTION VENTRICULAR-PERITONEAL;  Surgeon: Sharon Pitter, MD;  Location: [place] [facility];  Service: Neurosurgery;  Laterality: Right;  . Radiology with anesthesia N/A 03/04/2014    Procedure: RADIOLOGY WITH ANESTHESIA;  Surgeon: Medication Radiologist,  MD;  Location: [place];  Service: Radiology;  Laterality: N/A;    FAMILY HISTORY: Family History  Problem Relation Age of Onset  . Hypertension Mother   . Hypertension Father     SOCIAL HISTORY: History   Social History  . Marital Status: Widowed    Spouse Name: N/A  . Number of Children: 4  . Years of Education: hs grad   Occupational History  . Not on file.   Social History Main Topics  . Smoking status: Heavy Tobacco Smoker -- 0.50 packs/day    Types: Cigarettes    Last Attempt to Quit: 02/28/2014  . Smokeless tobacco: Never Used  . Alcohol Use: 0.0 oz/week    0 Standard drinks or equivalent per week     Comment: recovering alcoholic- started drinking 2 weeks ago (07-04-14), stated not drinking. 08-16-14  . Drug Use: No  . Sexual Activity: Not on file   Other Topics Concern  . Not on file   Social History Narrative   Caffeine 1 cup am avg.     PHYSICAL EXAM  Filed Vitals:   08/16/14 0851  BP: 111/73  Pulse: 82  Height: 5\' 4"  (1.626 m)  Weight: 118 lb 8 oz (53.751 kg)   Body mass index is 20.33 kg/(m^2).  Generalized: Well developed, well groomed in no acute distress  Head: normocephalic and atraumatic,. Oropharynx benign  Neck: Supple, no carotid bruits  Cardiac: Regular rate rhythm, no murmur  Musculoskeletal: No deformity   Neurological examination   Mentation: Alert oriented to time, place, history taking. Attention span and concentration appropriate. MOCA 26/30. AFT 12, Follows all commands speech and language fluent.   Cranial nerve II-XII: Pupils were equal round reactive to light extraocular movements were full, visual field were full on confrontational test. Facial sensation and strength were normal. hearing was intact to finger rubbing bilaterally. Uvula tongue midline. head turning and shoulder shrug were normal and symmetric.Tongue protrusion into cheek strength was normal. Motor: normal bulk and tone, full strength in the BUE, BLE, fine finger  movements normal, no pronator drift. No focal weakness Sensory: normal and symmetric to light touch, pinprick, and  Vibration,   Coordination: finger-nose-finger, heel-to-shin bilaterally, no dysmetria Reflexes: Brachioradialis 2/2, biceps 2/2, triceps 2/2, patellar 2/2, Achilles 2/2, plantar responses were flexor bilaterally. Gait and Station: Rising up from seated position without assistance, normal stance,  moderate stride, good arm swing, smooth turning, able to perform tiptoe, and heel walking without difficulty. Tandem gait is mildly unsteady. No assistive device  DIAGNOSTIC DATA (LABS, IMAGING, TESTING) - I reviewed patient records, labs, notes, testing and imaging myself where available.  Lab  Results  Component Value Date   WBC 13.5* 07/04/2014   HGB 14.3 07/04/2014   HCT 42.2 07/04/2014   MCV 93.0 07/04/2014   PLT 292 07/04/2014      Component Value Date/Time   NA 131* 07/04/2014 1300   K 4.1 07/04/2014 1300   CL 94* 07/04/2014 1300   CO2 25 07/04/2014 1300   GLUCOSE 96 07/04/2014 1300   BUN 11 07/04/2014 1300   CREATININE 0.94 07/04/2014 1300   CALCIUM 9.2 07/04/2014 1300   PROT 6.7 07/04/2014 1300   ALBUMIN 3.8 07/04/2014 1300   AST 32 07/04/2014 1300   ALT 19 07/04/2014 1300   ALKPHOS 61 07/04/2014 1300   BILITOT 0.5 07/04/2014 1300   GFRNONAA 57* 07/04/2014 1300   GFRAA >60 07/04/2014 1300   Lab Results  Component Value Date   CHOL 150 03/10/2014   HDL 61 03/10/2014   LDLCALC 74 03/10/2014   TRIG 77 03/10/2014   CHOLHDL 2.5 03/10/2014    Lab Results  Component Value Date   SAYTKZSW10 932 03/10/2014   Lab Results  Component Value Date   TSH 1.683 03/11/2014      ASSESSMENT AND PLAN  80 y.o. year old female  has a past medical history of cognitive disturbance from subarachnoid bleed December 2015. She is now back to driving. She is having diarrhea on Aricept. Seen in consultation with Dr. Brett Fairy Reviewed hospital coarse and records Aricept 5  mg twice daily (1/2) tab Follow-up in 6 months for repeat [place], [facility], [facility], [facility] [address] [place], [place] [address] [phone]

## 2014-08-16 NOTE — Patient Instructions (Signed)
Aricept 5 mg twice daily  Follow-up in 6 months

## 2014-08-16 NOTE — Progress Notes (Signed)
I agree with the assessment and plan as directed by NP .The patient is known to me .   Anysa Tacey, MD  

## 2014-10-08 DIAGNOSIS — A09 Infectious gastroenteritis and colitis, unspecified: Secondary | ICD-10-CM | POA: Diagnosis not present

## 2014-10-08 DIAGNOSIS — Z1210 Encounter for screening for malignant neoplasm of intestinal tract, unspecified: Secondary | ICD-10-CM | POA: Diagnosis not present

## 2014-10-19 ENCOUNTER — Emergency Department (HOSPITAL_COMMUNITY): Payer: Medicare Other

## 2014-10-19 ENCOUNTER — Encounter (HOSPITAL_COMMUNITY): Payer: Self-pay | Admitting: Family Medicine

## 2014-10-19 ENCOUNTER — Emergency Department (HOSPITAL_COMMUNITY)
Admission: EM | Admit: 2014-10-19 | Discharge: 2014-10-19 | Disposition: A | Discharge status: Home / Self Care | Payer: Medicare Other | Attending: Emergency Medicine | Admitting: Emergency Medicine

## 2014-10-19 DIAGNOSIS — M1990 Unspecified osteoarthritis, unspecified site: Secondary | ICD-10-CM | POA: Diagnosis not present

## 2014-10-19 DIAGNOSIS — R42 Dizziness and giddiness: Secondary | ICD-10-CM | POA: Insufficient documentation

## 2014-10-19 DIAGNOSIS — T85618A Breakdown (mechanical) of other specified internal prosthetic devices, implants and grafts, initial encounter: Secondary | ICD-10-CM

## 2014-10-19 DIAGNOSIS — Z87448 Personal history of other diseases of urinary system: Secondary | ICD-10-CM | POA: Diagnosis not present

## 2014-10-19 DIAGNOSIS — Z87440 Personal history of urinary (tract) infections: Secondary | ICD-10-CM | POA: Diagnosis not present

## 2014-10-19 DIAGNOSIS — H9193 Unspecified hearing loss, bilateral: Secondary | ICD-10-CM | POA: Diagnosis not present

## 2014-10-19 DIAGNOSIS — Z720 Tobacco use: Secondary | ICD-10-CM | POA: Diagnosis not present

## 2014-10-19 DIAGNOSIS — Z79899 Other long term (current) drug therapy: Secondary | ICD-10-CM | POA: Diagnosis not present

## 2014-10-19 DIAGNOSIS — R51 Headache: Secondary | ICD-10-CM | POA: Diagnosis not present

## 2014-10-19 DIAGNOSIS — E785 Hyperlipidemia, unspecified: Secondary | ICD-10-CM | POA: Insufficient documentation

## 2014-10-19 DIAGNOSIS — R410 Disorientation, unspecified: Secondary | ICD-10-CM | POA: Diagnosis not present

## 2014-10-19 DIAGNOSIS — M47812 Spondylosis without myelopathy or radiculopathy, cervical region: Secondary | ICD-10-CM | POA: Diagnosis not present

## 2014-10-19 DIAGNOSIS — R4182 Altered mental status, unspecified: Secondary | ICD-10-CM | POA: Diagnosis not present

## 2014-10-19 LAB — COMPREHENSIVE METABOLIC PANEL
ALK PHOS: 41 U/L (ref 38–126)
ALT: 17 U/L (ref 14–54)
AST: 23 U/L (ref 15–41)
Albumin: 3.8 g/dL (ref 3.5–5.0)
Anion gap: 9 (ref 5–15)
BILIRUBIN TOTAL: 0.4 mg/dL (ref 0.3–1.2)
BUN: 10 mg/dL (ref 6–20)
CALCIUM: 9.1 mg/dL (ref 8.9–10.3)
CO2: 29 mmol/L (ref 22–32)
CREATININE: 0.79 mg/dL (ref 0.44–1.00)
Chloride: 96 mmol/L — ABNORMAL LOW (ref 101–111)
GFR calc Af Amer: >60 mL/min (ref >60)
Glucose, Bld: 86 mg/dL (ref 65–99)
Potassium: 4.1 mmol/L (ref 3.5–5.1)
Sodium: 134 mmol/L — ABNORMAL LOW (ref 135–145)
Total Protein: 6.3 g/dL — ABNORMAL LOW (ref 6.5–8.1)

## 2014-10-19 LAB — CBG MONITORING, ED: GLUCOSE-CAPILLARY: 92 mg/dL (ref 65–99)

## 2014-10-19 LAB — CBC
HCT: 42 % (ref 36.0–46.0)
Hemoglobin: 14 g/dL (ref 12.0–15.0)
MCH: 32.4 pg (ref 26.0–34.0)
MCHC: 33.3 g/dL (ref 30.0–36.0)
MCV: 97.2 fL (ref 78.0–100.0)
PLATELETS: 266 10*3/uL (ref 150–400)
RBC: 4.32 MIL/uL (ref 3.87–5.11)
RDW: 13.1 % (ref 11.5–15.5)
WBC: 7.8 10*3/uL (ref 4.0–10.5)

## 2014-10-19 NOTE — ED Notes (Signed)
CT called to come get patient ASAP

## 2014-10-19 NOTE — ED Notes (Signed)
Patients family member expressed he was worried about patient. No neuro deficits noted, pt states "I dont feel right" LSN yesterday. Pt is AAOX4, in NAD

## 2014-10-19 NOTE — ED Notes (Signed)
Pt ambulatory at discharge. Gait steady. A/o x4. NAD.

## 2014-10-19 NOTE — ED Notes (Addendum)
Pt presents from home via POV with c/o intermittent confusion, "just not feeling right" with some gait disturbances and mild lethargy.  Pt reports falling while gardening today, but denies any injury.  Pt reports history of aneurysm 36months ago with surgical intervention.  Pt is alert and oriented and appears in NAD.  Denies nausea, or vomiting but endorses chronic Diarrhea.  PT denies CP or SOB.

## 2014-10-19 NOTE — ED Notes (Signed)
Pt transported to CT ?

## 2014-10-19 NOTE — ED Notes (Signed)
CBG-92. Notified RN

## 2014-10-19 NOTE — Discharge Instructions (Signed)

## 2014-10-19 NOTE — ED Notes (Signed)
Pt transporting to xray. NAD. States "I feel back to normal now"

## 2014-10-19 NOTE — ED Provider Notes (Signed)
CSN: [id number]     Arrival date & time 10/19/14  1202 History   First MD Initiated Contact with Patient 10/19/14 1538     Chief Complaint  Patient presents with  . Altered Mental Status     (Consider location/radiation/quality/duration/timing/severity/associated sxs/prior Treatment) HPI Comments: Patient here with some lethargy. There is some concern that she had another aneurysm since she had one 6 months ago that required aneurysm coiling and VP shunt. Grandson said she was ataxic, wobbling, side-to-side, but that has resolved. No confusion, no altered mental status, but patient states she doesn't feel like herself.  Patient is a 80 y.o. female presenting with altered mental status. The history is provided by the patient.  Altered Mental Status Presenting symptoms: lethargy   Severity:  Mild Most recent episode:  Today Episode history:  Single Timing:  Constant Progression:  Resolved Chronicity:  New Context: recent illness (recent aneurysm with repair and VP shunt placement 6 months ago)   Associated symptoms: no abdominal pain, no fever and no vomiting     Past Medical History  Diagnosis Date  . Dyslipidemia   . Cystitis 03/02/2014    acute   . UTI (urinary tract infection) 03/02/2014  . Arthritis     osteo  . Hearing loss bil    got hearing aides.    Past Surgical History  Procedure Laterality Date  . Abdominal hysterectomy    . Radiology with anesthesia N/A 03/04/2014    Procedure: RADIOLOGY WITH ANESTHESIA;  Surgeon: Rob Hickman, MD;  Location: [facility];  Service: Radiology;  Laterality: N/A;  . Ventriculoperitoneal shunt Right 03/15/2014    Procedure: SHUNT INSERTION VENTRICULAR-PERITONEAL;  Surgeon: Charlie Pitter, MD;  Location: [facility];  Service: Neurosurgery;  Laterality: Right;  . Radiology with anesthesia N/A 03/04/2014    Procedure: RADIOLOGY WITH ANESTHESIA;  Surgeon: Medication Radiologist, MD;  Location: [facility];  Service: Radiology;  Laterality:  N/A;   Family History  Problem Relation Age of Onset  . Hypertension Mother   . Hypertension Father    Social History  Substance Use Topics  . Smoking status: Heavy Tobacco Smoker -- 0.50 packs/day    Types: Cigarettes    Last Attempt to Quit: 02/28/2014  . Smokeless tobacco: Never Used  . Alcohol Use: 0.0 oz/week    0 Standard drinks or equivalent per week     Comment: recovering alcoholic- started drinking 2 weeks ago (07-04-14), stated not drinking. 08-16-14   OB History    No data available     Review of Systems  Constitutional: Negative for fever and chills.  Respiratory: Negative for cough and shortness of breath.   Cardiovascular: Negative for chest pain and leg swelling.  Gastrointestinal: Negative for vomiting and abdominal pain.  Neurological: Positive for dizziness ("wooziness").  All other systems reviewed and are negative.     Allergies  Review of patient's allergies indicates no known allergies.  Home Medications   Prior to Admission medications   Medication Sig Start Date End Date Taking? Authorizing Provider  cholecalciferol (VITAMIN D) 1000 UNITS tablet Take 1,000 Units by mouth daily.    Historical Provider, MD  donepezil (ARICEPT) 10 MG tablet Take 1 tablet (10 mg total) by mouth at bedtime. Patient taking differently: Take 10 mg by mouth at bedtime. 1/2 tab twice daily 06/07/14   Larey Seat, MD  glucosamine-chondroitin 500-400 MG tablet Take 1 tablet by mouth daily.    Historical Provider, MD  NON FORMULARY Bone injection/ once every  6 mo Bone health tablet once monthly.    Historical Provider, MD  Prenatal Vit-Fe Fumarate-FA (PRENATAL MULTIVITAMIN) TABS tablet Take 1 tablet by mouth daily at 12 noon.    Historical Provider, MD  RENOVA 0.02 % CREA Apply 1 application topically at bedtime.  01/12/14   Historical Provider, MD  sertraline (ZOLOFT) 50 MG tablet 1 TABLET ONCE A DAY ORALLY 30 DAY(S) 08/11/14   Historical Provider, MD  simvastatin (ZOCOR) 40  MG tablet Take 40 mg by mouth daily.  05/21/14   Historical Provider, MD  vitamin E 100 UNIT capsule Take 100 Units by mouth daily.    Historical Provider, MD   BP 120/75 mmHg  Pulse 70  Temp(Src) 98.1 F (36.7 C) (Oral)  Resp 13  SpO2 100% Physical Exam  Constitutional: She is oriented to person, place, and time. She appears well-developed and well-nourished. No distress.  HENT:  Head: Normocephalic and atraumatic.  Mouth/Throat: Oropharynx is clear and moist.  Eyes: EOM are normal. Pupils are equal, round, and reactive to light.  Neck: Normal range of motion. Neck supple.  Cardiovascular: Normal rate and regular rhythm.  Exam reveals no friction rub.   No murmur heard. Pulmonary/Chest: Effort normal and breath sounds normal. No respiratory distress. She has no wheezes. She has no rales.  Abdominal: Soft. She exhibits no distension. There is no tenderness. There is no rebound.  Musculoskeletal: Normal range of motion. She exhibits no edema.  Neurological: She is alert and oriented to person, place, and time. No cranial nerve deficit. She exhibits normal muscle tone. Coordination and gait normal. GCS eye subscore is 4. GCS verbal subscore is 5. GCS motor subscore is 6.  Skin: She is not diaphoretic.  Nursing note and vitals reviewed.   ED Course  Procedures (including critical care time) Labs Review Labs Reviewed  COMPREHENSIVE METABOLIC PANEL - Abnormal; Notable for the following:    Sodium 134 (*)    Chloride 96 (*)    Total Protein 6.3 (*)    All other components within normal limits  CBC  CBG MONITORING, ED  I-STAT TROPOININ, ED    Imaging Review Ct Head Wo Contrast  10/19/2014   CLINICAL DATA:  Balance issues with altered mental status and history of VP shunt.  EXAM: CT HEAD WITHOUT CONTRAST  TECHNIQUE: Contiguous axial images were obtained from the base of the skull through the vertex without intravenous contrast.  COMPARISON:  03/29/2014  FINDINGS: Sinuses/Soft  tissues: Clear paranasal sinuses and mastoid air cells.  Intracranial: Cerebral atrophy, with resultant prominence of the extra-axial spaces. This is felt to be similar. Beam hardening artifact from right sided aneurysm coiling. Right-sided VP shunt catheter terminates at the genu of the left internal capsule, as before. Decreased lateral ventricular size, without hydrocephalus. Given above artifact, no evidence of hemorrhage, mass lesion, acute infarct, intra-axial, or extra-axial fluid collection.  IMPRESSION: 1.  No acute intracranial abnormality. 2. Right-sided VP shunt catheter, unchanged in position. No hydrocephalus. 3. Artifact degradation from right posterior communicating artery aneurysm coiling. 4. Advanced cerebral atrophy.   Electronically Signed   By: Abigail Miyamoto M.D.   On: 10/19/2014 15:07   I have personally reviewed and evaluated these images and lab results as part of my medical decision-making.   EKG Interpretation   Date/Time:  Tuesday October 19 2014 13:16:31 EDT Ventricular Rate:  58 PR Interval:  184 QRS Duration: 78 QT Interval:  404 QTC Calculation: 396 R Axis:   102 Text Interpretation:  Sinus  bradycardia with Premature atrial complexes  Rightward axis Septal infarct , age undetermined Abnormal ECG Q waves  anteriorly, new Confirmed by Mingo Amber  MD, Omer ([id number]) on 10/19/2014 3:39:59  PM      MDM   Final diagnoses:  Dizziness    80 year old female here with some concerns for altered mental status and lethargy. States she did not eat a good dinner last night. She is eating well today. She was a little woozy and had some difficulty with ambulation earlier. This was around 9 or 10 this morning, I saw her around 4 PM this afternoon. It resolves after that time. She denies any chest pain, shortness of breath, fever, double or blurry vision, headache. She did have a recent shunt placed 6 months ago after an aneurysm was coiled. Here she is well-appearing, has normal gait.  She has normal cranial nerve exam. She has normal coordination. No concern for stroke. Initial labs are normal and head CT is normal. Will do a shunt series, check her troponin and EKG.  Shunt series ok. Labs ok. I spoke with Dr. Annette Stable with Neurosurgery, he stated everything appeared well. I spoke with Dr. Doy Mince of Neurology who was concerned she could've had a seizure vs TIA. Patient refused admission for this. I spoke with her PCP's office, [place] at [facility], who will f/u with her tomorrow.  Evelina Bucy, MD 10/19/14 [phone]

## 2014-10-19 NOTE — ED Notes (Signed)
Will update vitals when pt returns from Radiology Department

## 2014-10-21 ENCOUNTER — Telehealth: Payer: Self-pay | Admitting: Neurology

## 2014-10-21 NOTE — Telephone Encounter (Signed)
Barb/ Dr Watt Climes called requesting medical clearance for patient's colonoscopy 10/27/14. Please call [phone]

## 2014-10-21 NOTE — Telephone Encounter (Signed)
Dr. Brett Fairy gave verbal order for medical clearance for pt's colonoscopy. Left a message on Barb/Dr. Watt Climes 's phone letting them know that pt has been cleared for colonoscopy.

## 2014-10-22 DIAGNOSIS — F0390 Unspecified dementia without behavioral disturbance: Secondary | ICD-10-CM | POA: Diagnosis not present

## 2014-10-22 DIAGNOSIS — I609 Nontraumatic subarachnoid hemorrhage, unspecified: Secondary | ICD-10-CM | POA: Diagnosis not present

## 2014-10-22 DIAGNOSIS — G919 Hydrocephalus, unspecified: Secondary | ICD-10-CM | POA: Diagnosis not present

## 2014-10-22 DIAGNOSIS — E780 Pure hypercholesterolemia: Secondary | ICD-10-CM | POA: Diagnosis not present

## 2014-10-22 DIAGNOSIS — R42 Dizziness and giddiness: Secondary | ICD-10-CM | POA: Diagnosis not present

## 2014-10-22 DIAGNOSIS — F17200 Nicotine dependence, unspecified, uncomplicated: Secondary | ICD-10-CM | POA: Diagnosis not present

## 2014-10-25 ENCOUNTER — Telehealth: Payer: Self-pay | Admitting: Neurology

## 2014-10-25 DIAGNOSIS — F0390 Unspecified dementia without behavioral disturbance: Secondary | ICD-10-CM

## 2014-10-25 MED ORDER — MEMANTINE HCL ER 7 & 14 & 21 &28 MG PO CP24: 7.0000 mg | ORAL_CAPSULE | Freq: Every morning | ORAL | Status: DC

## 2014-10-25 NOTE — Telephone Encounter (Signed)
Erasmo Downer called patient I will recommend to use Namenda in a starter pack form. I understand it is generic now. There is a once a day Namenda that will be easier for memory impairment patient to take. I will prescribe to starter pack and we will refill at the caudal level once the patient is through the starter pack. I see no reason why she shouldn't have a colonoscopy, unfortunately she has not tolerated Aricept even at a low dose I recommend to take it with some applesauce and see if that can make a difference if not she will just be on Namenda alone.

## 2014-10-25 NOTE — Telephone Encounter (Signed)
Receipt confirmed for namenda at pt's [facility].

## 2014-10-25 NOTE — Telephone Encounter (Signed)
Spoke to pt and informed her that Dr. Brett Fairy recommended stopping the aricept and starting the namenda. Explained the titration of namenda and that it was sent to her [facility] pharmacy. Pt verbalized understanding.

## 2014-10-25 NOTE — Telephone Encounter (Signed)
Patient returned call to [facility].

## 2014-10-25 NOTE — Telephone Encounter (Signed)
Called pt to tell her that Dr. Brett Fairy called in a namenda titration pack. No answer, left a message on her home phone asking her to call me back.

## 2014-10-25 NOTE — Telephone Encounter (Signed)
Pt called about her medication donepezil (ARICEPT) 10 MG tablet, states she was to take 10 mg but it gave her diarrhea, states that dr. Rockey Situ her to cut in half and take 5mg . She says she is still have diarrhea and would like to know if she can take something else. She is scheduled for a colonoscopy on 8/24 as well. Please call and advise [phone] - work #

## 2014-10-27 DIAGNOSIS — R197 Diarrhea, unspecified: Secondary | ICD-10-CM | POA: Diagnosis not present

## 2014-10-27 DIAGNOSIS — Z1211 Encounter for screening for malignant neoplasm of colon: Secondary | ICD-10-CM | POA: Diagnosis not present

## 2014-10-27 DIAGNOSIS — K5730 Diverticulosis of large intestine without perforation or abscess without bleeding: Secondary | ICD-10-CM | POA: Diagnosis not present

## 2014-10-27 LAB — HM COLONOSCOPY

## 2014-11-29 ENCOUNTER — Other Ambulatory Visit: Payer: Self-pay | Admitting: Neurology

## 2014-11-29 DIAGNOSIS — Z961 Presence of intraocular lens: Secondary | ICD-10-CM | POA: Diagnosis not present

## 2014-11-29 DIAGNOSIS — H00012 Hordeolum externum right lower eyelid: Secondary | ICD-10-CM | POA: Diagnosis not present

## 2014-11-30 MED ORDER — MEMANTINE HCL ER 28 MG PO CP24: 28.0000 mg | ORAL_CAPSULE | Freq: Every day | ORAL | Status: DC

## 2014-12-27 ENCOUNTER — Other Ambulatory Visit: Payer: Self-pay | Admitting: Neurology

## 2015-01-20 DIAGNOSIS — G910 Communicating hydrocephalus: Secondary | ICD-10-CM | POA: Diagnosis not present

## 2015-01-22 DIAGNOSIS — Z23 Encounter for immunization: Secondary | ICD-10-CM | POA: Diagnosis not present

## 2015-02-01 DIAGNOSIS — F17200 Nicotine dependence, unspecified, uncomplicated: Secondary | ICD-10-CM | POA: Diagnosis not present

## 2015-02-01 DIAGNOSIS — B009 Herpesviral infection, unspecified: Secondary | ICD-10-CM | POA: Diagnosis not present

## 2015-02-01 DIAGNOSIS — R197 Diarrhea, unspecified: Secondary | ICD-10-CM | POA: Diagnosis not present

## 2015-02-01 DIAGNOSIS — F0390 Unspecified dementia without behavioral disturbance: Secondary | ICD-10-CM | POA: Diagnosis not present

## 2015-02-15 ENCOUNTER — Encounter: Payer: Self-pay | Admitting: Nurse Practitioner

## 2015-02-15 ENCOUNTER — Ambulatory Visit (INDEPENDENT_AMBULATORY_CARE_PROVIDER_SITE_OTHER): Payer: Medicare Other | Admitting: Nurse Practitioner

## 2015-02-15 VITALS — BP 118/64 | HR 71 | Ht 64.0 in | Wt 117.0 lb

## 2015-02-15 DIAGNOSIS — R413 Other amnesia: Secondary | ICD-10-CM

## 2015-02-15 DIAGNOSIS — I609 Nontraumatic subarachnoid hemorrhage, unspecified: Secondary | ICD-10-CM

## 2015-02-15 NOTE — Progress Notes (Signed)
[facility]  PATIENT: Sharon [name] DOB: Jun 15, 1934   REASON FOR VISIT: Follow-up for nontraumatic subarachnoid hemorrhage, memory loss HISTORY FROM: Patient    HISTORY OF PRESENT ILLNESS:Sharon [name], 81 year old female returns for follow-up. She was last seen 08/16/14.  She has a history of memory loss and nontraumatic subarachnoid hemorrhage. Her  Aricept was reduced to 5 mg daily at that visit due to diarrhea. Since that time she called back in expected Sharon [name] on 10/25/2014 who told her to stop the Aricept altogether and began a Namenda starter pack which she picked up from her pharmacy and took the first month. According to her pharmacy today her refills have never been picked up. She reports that her memory is stable. She no longer has caregiver in the home. She is back to driving and has not had any problems. MOCA test today 23 out of 30. Last was 26 out of 30 .review She reports that her appetite is good and she is sleeping well. She is taking yoga .She returns for reevaluation   UPDATE 4-4-16CD The meeting today for a prolonged revisit appointment of 30 minutes. Sharon [name] is here today with a caretaker, Sharon [name], and appears very alert and concentrated. She is reporting depression, but also hope, She has reported diarrhea, and suspects that either her bacterial setting is disturbed from ATB , or she may have contracted clostridium . Be repeated today and MMSE that the patient had already performed the last time at 23 out of 30 points today she scored 27 out of 30 points. She recalled 2 day 2 of the 3 words that she missed last time was able to do the serial sevens completely she was not sure about the season and also the spring she stated it was fall. She was able to copy an object and clock draw a clock face correctly she named 9 animals in the animal fluency test and she wrote a perfect sentence writing. Over 2 to make it a little bit more difficult she scored 25  out of 30 on the much more difficult [place] assessment test.I also have a transcranial Doppler study available here as was normal as flow direction and velocity in all identified vessels in expected sinus no evidence of stenosis basilar spasm or occlusion. The patient fell asleep doing the test and the technician noted that she was snoring. Her VP shunt performs well. She is much improved.  As she has no longer confusion, she keeps active in conversation and is interested in daily activities. I will ask her to come back in June and likely will give her permission d to drive if her recovery is sustained. I would like her to try the Ritalin that she was prescribed in [place] [facility]. I will increase aricept to 10 mg now form 5 mg.   HISTORY:Sharon [name] and had a very eventful end of the last year . He was suspected to have a UTI causing confusion, and was admitted on 02-28-14 , given multiple doses of ativan - Her agitation got worse. The attempt to do a lumbar puncture was unsuccessful. She needed sedation for an MRI, which did showed an aneurysm. Under the MRI sedation a LP was finally done, the CSF was cloudy. 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 She was coiled by Sharon [name] .  [name]- but a communicating hydrocephalus developed nonetheless, and a VP shunt was placed on Jan.  11th 2016 by Sharon [name]. Since the aneurysm bleed occurred the patient also developed a urinary tract infection indeed and 100,000 colonies were seen on 04/11/2014 in her urinary sample that was obtained while she was in rehabilitation [facility]. The main concern now is that this 80 year old right-handed Caucasian lady has still continued to have some altered mental status. She is not quite as concentrated and focused and she is repetitive.   The patient used to live independently prior to December  2015. She was referred referred from hospital to come in place for rehabilitation per physical and occupational therapy were ongoing. She was placed on the more top to prevent muscle spasms. Decadron protocol was directed. In generally the patient still had some low-grade fevers that maintained on broad-spectrum antibiotics these stopped. She tends to confabulate during the exam and she has mild left-sided dysmetria and ataxia clearly related to the right-sided lesion. She is easily distracted. She was placed on Ritalin at bedtime to help improve anytime arousal focus attention and issues in regards to mental status. She is developed insomnia which is very common after an aneurysm bleed. She sometimes restless but she is doing much better than she has a visitor that she recognizes and trusts. She is currently on Pepcid, on Ritalin, Zocor at bedtime only. She was on a mechanical soft diet and thin liquids. Diet changed during her [place] rehabilitation stay to a regular diet. I was able to review the patient's recent lab results from 04-13-14 showed a normal CBC and differential her platelet count was 463,000. Her urinalysis confirmed 2 days after the beginning of Macrobid only few bacteria. Hepatic metabolic panel was entirely normal including creatinine and potassium and sodium. I did not see a TSH. However the TSH would unlikely be affected with and only 6 weeks after an aneurysm bleed. I will refer the patient also for transcranial Doppler study to make sure that there is no muscle spasm at the current time. She does not complain about headaches right now. Neither the patient nor her daughter confirm any nausea the last couple of days. She has no neck stiffness.    REVIEW OF SYSTEMS: Full 14 system review of systems performed and notable only for those listed, all others are neg:  Constitutional: neg  Cardiovascular: neg Ear/Nose/Throat: neg  Skin: neg Eyes: neg Respiratory: neg Gastroitestinal: neg    Hematology/Lymphatic: neg  Endocrine: neg Musculoskeletal:neg Allergy/Immunology: neg Neurological: neg Psychiatric: neg Sleep : neg   ALLERGIES: No Known Allergies  HOME MEDICATIONS: Outpatient Prescriptions Prior to Visit  Medication Sig Dispense Refill  . denosumab (PROLIA) 60 MG/ML SOLN injection Inject 60 mg into the skin every 6 (six) months. Administer in upper arm, thigh, or abdomen    . glucosamine-chondroitin 500-400 MG tablet Take 1 tablet by mouth at bedtime.     Marland Kitchen NAMENDA XR 28 MG CP24 24 hr capsule TAKE ONE CAPSULE BY MOUTH EVERY DAY 30 capsule 3  . polyethylene glycol-electrolytes (NULYTELY/GOLYTELY) 420 G solution See admin instructions.  0  . Prenatal Vit-Fe Fumarate-FA (PRENATAL MULTIVITAMIN) TABS tablet Take 1 tablet by mouth daily at 12 noon.    Marland Kitchen RENOVA 0.02 % CREA Apply 1 application topically at bedtime.   3  . sertraline (ZOLOFT) 50 MG tablet 1 TABLET ONCE A DAY ORALLY 30 DAY(S)  12  . simvastatin (ZOCOR) 40 MG tablet Take 40 mg by mouth daily.   3  . vitamin E 100 UNIT capsule Take 100 Units by mouth daily.    Marland Kitchen  cholecalciferol (VITAMIN D) 1000 UNITS tablet Take 1,000 Units by mouth daily.    Marland Kitchen donepezil (ARICEPT) 10 MG tablet Take 1 tablet (10 mg total) by mouth at bedtime. (Patient not taking: Reported on 02/15/2015) 90 tablet 3   No facility-administered medications prior to visit.    PAST MEDICAL HISTORY: Past Medical History  Diagnosis Date  . Dyslipidemia   . Cystitis 03/02/2014    acute   . UTI (urinary tract infection) 03/02/2014  . Arthritis     osteo  . Hearing loss bil    got hearing aides.     PAST SURGICAL HISTORY: Past Surgical History  Procedure Laterality Date  . Abdominal hysterectomy    . Radiology with anesthesia N/A 03/04/2014    Procedure: RADIOLOGY WITH ANESTHESIA;  Surgeon: Rob Hickman, MD;  Location: [facility];  Service: Radiology;  Laterality: N/A;  . Ventriculoperitoneal shunt Right 03/15/2014    Procedure:  SHUNT INSERTION VENTRICULAR-PERITONEAL;  Surgeon: Charlie Pitter, MD;  Location: [facility];  Service: Neurosurgery;  Laterality: Right;  . Radiology with anesthesia N/A 03/04/2014    Procedure: RADIOLOGY WITH ANESTHESIA;  Surgeon: Medication Radiologist, MD;  Location: [facility];  Service: Radiology;  Laterality: N/A;    FAMILY HISTORY: Family History  Problem Relation Age of Onset  . Hypertension Mother   . Hypertension Father     SOCIAL HISTORY: Social History   Social History  . Marital Status: Widowed    Spouse Name: N/A  . Number of Children: 4  . Years of Education: hs grad   Occupational History  . Not on file.   Social History Main Topics  . Smoking status: Heavy Tobacco Smoker -- 0.50 packs/day    Types: Cigarettes    Last Attempt to Quit: 02/28/2014  . Smokeless tobacco: Never Used  . Alcohol Use: 0.0 oz/week    0 Standard drinks or equivalent per week     Comment: recovering alcoholic- started drinking 2 weeks ago (07-04-14), stated not drinking. 08-16-14  . Drug Use: No  . Sexual Activity: Not on file   Other Topics Concern  . Not on file   Social History Narrative   Caffeine 1 cup am avg.     PHYSICAL EXAM  Filed Vitals:   02/15/15 0831  BP: 118/64  Pulse: 71  Height: 5\' 4"  (1.626 m)  Weight: 117 lb (53.071 kg)   Body mass index is 20.07 kg/(m^2). Generalized: Well developed, well groomed in no acute distress  Head: normocephalic and atraumatic,. Oropharynx benign  Neck: Supple, no carotid bruits  Cardiac: Regular rate rhythm, no murmur  Musculoskeletal: No deformity   Neurological examination   Mentation: Alert oriented to time, place, history taking. Attention span and concentration appropriate. Sharon [name] 23/30. AFT 6 Follows all commands speech and language fluent.   Cranial nerve II-XII: Pupils were equal round reactive to light extraocular movements were full, visual field were full on confrontational test. Facial sensation and strength were  normal. hearing was intact to finger rubbing bilaterally. Uvula tongue midline. head turning and shoulder shrug were normal and symmetric.Tongue protrusion into cheek strength was normal. Motor: normal bulk and tone, full strength in the BUE, BLE, fine finger movements normal, no pronator drift. No focal weakness Sensory: normal and symmetric to light touch, pinprick, and Vibration,  Coordination: finger-nose-finger, heel-to-shin bilaterally, no dysmetria Reflexes: Brachioradialis 2/2, biceps 2/2, triceps 2/2, patellar 2/2, Achilles 2/2, plantar responses were flexor bilaterally. Gait and Station: Rising up from seated position without assistance, normal  stance, moderate stride, good arm swing, smooth turning, able to perform tiptoe, and heel walking without difficulty. Tandem gait is mildly unsteady. No assistive device    DIAGNOSTIC DATA (LABS, IMAGING, TESTING) - I reviewed patient records, labs, notes, testing and imaging myself where available.  Lab Results  Component Value Date   WBC 7.8 10/19/2014   HGB 14.0 10/19/2014   HCT 42.0 10/19/2014   MCV 97.2 10/19/2014   PLT 266 10/19/2014      Component Value Date/Time   NA 134* 10/19/2014 1320   K 4.1 10/19/2014 1320   CL 96* 10/19/2014 1320   CO2 29 10/19/2014 1320   GLUCOSE 86 10/19/2014 1320   BUN 10 10/19/2014 1320   CREATININE 0.79 10/19/2014 1320   CALCIUM 9.1 10/19/2014 1320   PROT 6.3* 10/19/2014 1320   ALBUMIN 3.8 10/19/2014 1320   AST 23 10/19/2014 1320   ALT 17 10/19/2014 1320   ALKPHOS 41 10/19/2014 1320   BILITOT 0.4 10/19/2014 1320   GFRNONAA >60 10/19/2014 1320   GFRAA >60 10/19/2014 1320   Lab Results  Component Value Date   CHOL 150 03/10/2014   HDL 61 03/10/2014   LDLCALC 74 03/10/2014   TRIG 77 03/10/2014   CHOLHDL 2.5 03/10/2014    Lab Results  Component Value Date   [id number] 03/10/2014   Lab Results  Component Value Date   TSH 1.683 03/11/2014      ASSESSMENT AND PLAN 80  y.o. 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 She is now back to driving. She is having diarrhea on Aricept and that was discontinued by Sharon [name] 10/25/14 . She was placed on a Namenda starter pack and never picked up the refill medication so she has been off medication for 2 months.The patient is a current patient of Sharon [name]  who is out of the office today . This note is sent to the work in doctor.      Will restart starter pack of Namenda then resume 28mg  daily, confirmed with pharmacy that she never picked up maintenance dose Instructed patient to take starter pack then pick up the maintenance dose.  Patient made aware her [place], testing had dropped a few points and this is the reason for the medication F/U visit with Dr. Maureen Chatters in 6 months repeat MOCA at that time Dennie Bible, [facility], [facility], [facility] [address] [place], [place] [address] [phone]

## 2015-02-15 NOTE — Patient Instructions (Signed)
Pt will restart starter pack of Namenda then resume 28mg  daily F/U visit with Dr. Maureen Chatters in 6 months

## 2015-02-15 NOTE — Progress Notes (Signed)
I reviewed above note and agree with the assessment and plan.  Rosalin Hawking, MD PhD Stroke Neurology 02/15/2015 5:17 PM

## 2015-02-23 DIAGNOSIS — M810 Age-related osteoporosis without current pathological fracture: Secondary | ICD-10-CM | POA: Diagnosis not present

## 2015-03-08 ENCOUNTER — Telehealth: Payer: Self-pay | Admitting: Nurse Practitioner

## 2015-03-08 MED ORDER — MEMANTINE HCL ER 28 MG PO CP24: 28.0000 mg | ORAL_CAPSULE | Freq: Every day | ORAL | Status: DC

## 2015-03-08 NOTE — Telephone Encounter (Signed)
Rx has been sent.  Receipt confirmed by pharmacy.   

## 2015-03-08 NOTE — Telephone Encounter (Signed)
Patient is calling and has taken the starter pack of Namenda.  She now needs to get a Rx.  Please call in to [facility]. [place], [place].  Thanks!

## 2015-03-24 ENCOUNTER — Emergency Department (HOSPITAL_COMMUNITY)
Admission: EM | Admit: 2015-03-24 | Discharge: 2015-03-24 | Disposition: A | Discharge status: Home / Self Care | Payer: Medicare Other | Attending: Emergency Medicine | Admitting: Emergency Medicine

## 2015-03-24 ENCOUNTER — Encounter (HOSPITAL_COMMUNITY): Payer: Self-pay | Admitting: Emergency Medicine

## 2015-03-24 ENCOUNTER — Emergency Department (HOSPITAL_COMMUNITY): Payer: Medicare Other

## 2015-03-24 DIAGNOSIS — Z79899 Other long term (current) drug therapy: Secondary | ICD-10-CM | POA: Diagnosis not present

## 2015-03-24 DIAGNOSIS — E785 Hyperlipidemia, unspecified: Secondary | ICD-10-CM | POA: Diagnosis not present

## 2015-03-24 DIAGNOSIS — H9190 Unspecified hearing loss, unspecified ear: Secondary | ICD-10-CM | POA: Diagnosis not present

## 2015-03-24 DIAGNOSIS — Z87440 Personal history of urinary (tract) infections: Secondary | ICD-10-CM | POA: Diagnosis not present

## 2015-03-24 DIAGNOSIS — G4700 Insomnia, unspecified: Secondary | ICD-10-CM | POA: Insufficient documentation

## 2015-03-24 DIAGNOSIS — F17210 Nicotine dependence, cigarettes, uncomplicated: Secondary | ICD-10-CM | POA: Diagnosis not present

## 2015-03-24 DIAGNOSIS — R410 Disorientation, unspecified: Secondary | ICD-10-CM | POA: Diagnosis not present

## 2015-03-24 DIAGNOSIS — R197 Diarrhea, unspecified: Secondary | ICD-10-CM | POA: Diagnosis not present

## 2015-03-24 DIAGNOSIS — Z982 Presence of cerebrospinal fluid drainage device: Secondary | ICD-10-CM

## 2015-03-24 DIAGNOSIS — R4182 Altered mental status, unspecified: Secondary | ICD-10-CM | POA: Diagnosis present

## 2015-03-24 LAB — COMPREHENSIVE METABOLIC PANEL
ALBUMIN: 3.9 g/dL (ref 3.5–5.0)
ALK PHOS: 56 U/L (ref 38–126)
ALT: 19 U/L (ref 14–54)
ANION GAP: 11 (ref 5–15)
AST: 24 U/L (ref 15–41)
BILIRUBIN TOTAL: 0.6 mg/dL (ref 0.3–1.2)
BUN: 12 mg/dL (ref 6–20)
CALCIUM: 9.6 mg/dL (ref 8.9–10.3)
CO2: 28 mmol/L (ref 22–32)
Chloride: 98 mmol/L — ABNORMAL LOW (ref 101–111)
Creatinine, Ser: 0.87 mg/dL (ref 0.44–1.00)
GFR calc non Af Amer: >60 mL/min (ref >60)
GLUCOSE: 140 mg/dL — AB (ref 65–99)
POTASSIUM: 4.2 mmol/L (ref 3.5–5.1)
SODIUM: 137 mmol/L (ref 135–145)
TOTAL PROTEIN: 6.8 g/dL (ref 6.5–8.1)

## 2015-03-24 LAB — CBC
HEMATOCRIT: 46.7 % — AB (ref 36.0–46.0)
HEMOGLOBIN: 15.4 g/dL — AB (ref 12.0–15.0)
MCH: 32.4 pg (ref 26.0–34.0)
MCHC: 33 g/dL (ref 30.0–36.0)
MCV: 98.1 fL (ref 78.0–100.0)
Platelets: 253 10*3/uL (ref 150–400)
RBC: 4.76 MIL/uL (ref 3.87–5.11)
RDW: 13 % (ref 11.5–15.5)
WBC: 7.4 10*3/uL (ref 4.0–10.5)

## 2015-03-24 LAB — CBG MONITORING, ED: GLUCOSE-CAPILLARY: 91 mg/dL (ref 65–99)

## 2015-03-24 LAB — URINALYSIS, ROUTINE W REFLEX MICROSCOPIC
GLUCOSE, UA: NEGATIVE mg/dL
Hgb urine dipstick: NEGATIVE
KETONES UR: 15 mg/dL — AB
LEUKOCYTES UA: NEGATIVE
NITRITE: NEGATIVE
PH: 6 (ref 5.0–8.0)
Protein, ur: NEGATIVE mg/dL
SPECIFIC GRAVITY, URINE: 1.022 (ref 1.005–1.030)

## 2015-03-24 LAB — I-STAT CG4 LACTIC ACID, ED: Lactic Acid, Venous: 1.05 mmol/L (ref 0.5–2.0)

## 2015-03-24 MED ORDER — ACETAMINOPHEN 325 MG PO TABS: 650.0000 mg | ORAL_TABLET | Freq: Once | ORAL | Status: DC

## 2015-03-24 NOTE — ED Provider Notes (Signed)
CSN: [id number]     Arrival date & time 03/24/15  1000 History   First MD Initiated Contact with Patient 03/24/15 1028     Chief Complaint  Patient presents with  . Urinary Tract Infection  . Altered Mental Status     (Consider location/radiation/quality/duration/timing/severity/associated sxs/prior Treatment) HPI Comments: Past few days has been confused, having to repeat things A few weeks she reports feeling "out of body", "not myself" Family reports confusion Headache, mild temple No n/v/fevers Diarrhea for 1 month   Past Medical History  Diagnosis Date  . Dyslipidemia   . Cystitis 03/02/2014    acute   . UTI (urinary tract infection) 03/02/2014  . Arthritis     osteo  . Hearing loss bil    got hearing aides.    Past Surgical History  Procedure Laterality Date  . Abdominal hysterectomy    . Radiology with anesthesia N/A 03/04/2014    Procedure: RADIOLOGY WITH ANESTHESIA;  Surgeon: Rob Hickman, MD;  Location: [facility];  Service: Radiology;  Laterality: N/A;  . Ventriculoperitoneal shunt Right 03/15/2014    Procedure: SHUNT INSERTION VENTRICULAR-PERITONEAL;  Surgeon: Charlie Pitter, MD;  Location: [place] [facility];  Service: Neurosurgery;  Laterality: Right;  . Radiology with anesthesia N/A 03/04/2014    Procedure: RADIOLOGY WITH ANESTHESIA;  Surgeon: Medication Radiologist, MD;  Location: [place];  Service: Radiology;  Laterality: N/A;   Family History  Problem Relation Age of Onset  . Hypertension Mother   . Hypertension Father    Social History  Substance Use Topics  . Smoking status: Current Every Day Smoker -- 0.50 packs/day    Types: Cigarettes    Last Attempt to Quit: 02/28/2014  . Smokeless tobacco: Never Used  . Alcohol Use: 0.0 oz/week    0 Standard drinks or equivalent per week     Comment: recovering alcoholic- started drinking 2 weeks ago (07-04-14), stated not drinking. 08-16-14   OB History    No data available     Review of Systems   Constitutional: Negative for fever and appetite change.  HENT: Negative for sore throat.   Eyes: Negative for visual disturbance.  Respiratory: Negative for cough and shortness of breath.   Cardiovascular: Negative for chest pain.  Gastrointestinal: Positive for diarrhea. Negative for nausea, vomiting and abdominal pain.  Genitourinary: Negative for dysuria and difficulty urinating.  Musculoskeletal: Negative for back pain and neck pain.  Skin: Negative for rash.  Neurological: Positive for headaches. Negative for syncope, weakness and numbness.  Psychiatric/Behavioral: Positive for confusion.      Allergies  Review of patient's allergies indicates no known allergies.  Home Medications   Prior to Admission medications   Medication Sig Start Date End Date Taking? Authorizing Provider  cholecalciferol (VITAMIN D) 1000 UNITS tablet Take 1,000 Units by mouth daily.    Historical Provider, MD  denosumab (PROLIA) 60 MG/ML SOLN injection Inject 60 mg into the skin every 6 (six) months. Administer in upper arm, thigh, or abdomen    Historical Provider, MD  glucosamine-chondroitin 500-400 MG tablet Take 1 tablet by mouth at bedtime.     Historical Provider, MD  memantine (NAMENDA XR) 28 MG CP24 24 hr capsule Take 1 capsule (28 mg total) by mouth daily. 03/08/15   Dennie Bible, NP  polyethylene glycol-electrolytes (NULYTELY/GOLYTELY) 420 G solution See admin instructions. 10/21/14   Historical Provider, MD  Prenatal Vit-Fe Fumarate-FA (PRENATAL MULTIVITAMIN) TABS tablet Take 1 tablet by mouth daily at 12 noon.  Historical Provider, MD  Probiotic Product ([place]) Take 1 tablet by mouth 2 (two) times daily.    Historical Provider, MD  RENOVA 0.02 % CREA Apply 1 application topically at bedtime.  01/12/14   Historical Provider, MD  sertraline (ZOLOFT) 50 MG tablet 1 TABLET ONCE A DAY ORALLY 30 DAY(S) 08/11/14   Historical Provider, MD  simvastatin (ZOCOR) 40 MG tablet Take  40 mg by mouth daily.  05/21/14   Historical Provider, MD  vitamin E 100 UNIT capsule Take 100 Units by mouth daily.    Historical Provider, MD   BP 141/85 mmHg  Pulse 72  Temp(Src) 97.7 F (36.5 C) (Oral)  Resp 16  SpO2 98% Physical Exam  Constitutional: She is oriented to person, place, and time. She appears well-developed and well-nourished. No distress.  HENT:  Head: Normocephalic and atraumatic.  Mouth/Throat: Uvula is midline. No oropharyngeal exudate.  Eyes: Conjunctivae and EOM are normal.  Neck: Normal range of motion.  VP shunt palpable left neck, left side of head, no overlying erythema  Cardiovascular: Normal rate, regular rhythm, normal heart sounds and intact distal pulses.  Exam reveals no gallop and no friction rub.   No murmur heard. Pulmonary/Chest: Effort normal and breath sounds normal. No respiratory distress. She has no wheezes. She has no rales.  Abdominal: Soft. She exhibits no distension. There is no tenderness. There is no guarding.  Musculoskeletal: She exhibits no edema or tenderness.  Neurological: She is alert and oriented to person, place, and time. She has normal strength. No cranial nerve deficit or sensory deficit. Coordination and gait normal. GCS eye subscore is 4. GCS verbal subscore is 5. GCS motor subscore is 6.  Skin: Skin is warm and dry. No rash noted. She is not diaphoretic. No erythema.  Nursing note and vitals reviewed.   ED Course  Procedures (including critical care time) Labs Review Labs Reviewed  COMPREHENSIVE METABOLIC PANEL - Abnormal; Notable for the following:    Chloride 98 (*)    Glucose, Bld 140 (*)    All other components within normal limits  CBC - Abnormal; Notable for the following:    Hemoglobin 15.4 (*)    HCT 46.7 (*)    All other components within normal limits  URINALYSIS, ROUTINE W REFLEX MICROSCOPIC (NOT AT [facility]) - Abnormal; Notable for the following:    Bilirubin Urine SMALL (*)    Ketones, ur 15 (*)    All  other components within normal limits  CBG MONITORING, ED  I-STAT CG4 LACTIC ACID, ED    Imaging Review Dg Skull 1-3 Views  03/24/2015  CLINICAL DATA:  Ventriculoperitoneal shunt. EXAM: SKULL - 1-3 VIEW COMPARISON:  Head CT obtained earlier today. FINDINGS: Aneurysm coils in the right supraclinoid region. The visualized portions of the right ventriculoperitoneal shunt appear intact. Maxillary and mandibular dental prostheses. Cervical spine degenerative changes. IMPRESSION: The ventriculoperitoneal shunt appears intact. Electronically Signed   By: Claudie Revering M.D.   On: 03/24/2015 11:51   Dg Chest 1 View  03/24/2015  CLINICAL DATA:  Ventriculoperitoneal shunt status. EXAM: CHEST 1 VIEW COMPARISON:  10/19/2014 FINDINGS: VP shunt catheter courses along the right aspect of the chest into the upper abdomen and appears intact on this single projection. The cardiomediastinal silhouette is within normal limits. The patient has taken a greater inspiration than on the prior study. No confluent airspace opacity, edema, pleural effusion, or pneumothorax is identified. Slight thoracic dextroscoliosis is noted. IMPRESSION: VP shunt catheter appears intact  in the chest. Electronically Signed   By: Logan Bores M.D.   On: 03/24/2015 11:46   Dg Cervical Spine 1 View  03/24/2015  CLINICAL DATA:  Ventriculoperitoneal shunt EXAM: CERVICAL SPINE 1 VIEW COMPARISON:  None. FINDINGS: Frontal view obtained. A shunt catheter is seen along the right neck and upper thorax with visualized shunt tubing appearing intact. On this frontal view, moderate osteoarthritic changes noted throughout the cervical spine. No fracture or bony destruction apparent. Lung apices are clear. There is a small focus of calcification in the left carotid artery. IMPRESSION: Visualized portion of shunt catheter on the right appears intact. Moderate osteoarthritic change in visualized cervical spine on single frontal view. Focus of calcification in left  carotid artery. Electronically Signed   By: Lowella Grip III M.D.   On: 03/24/2015 11:44   Dg Abd 1 View  03/24/2015  CLINICAL DATA:  Shunt series EXAM: ABDOMEN - 1 VIEW COMPARISON:  10/19/2014 FINDINGS: Shunt tubing coiled in LEFT upper quadrant. Tubing appears intact without break or kink. No mass effect seen to suggest CSF pseudocyst. Bowel gas pattern normal. Bones demineralized. Degenerative changes LEFT hip joint and scattered throughout lumbar spine. No definite urinary tract calcification. IMPRESSION: Shunt tubing coiled in the LEFT upper quadrant. No acute abnormalities. Electronically Signed   By: Lavonia Dana M.D.   On: 03/24/2015 11:50   Ct Head Wo Contrast  03/24/2015  CLINICAL DATA:  Aneurysm 1 month ago, woke up this morning not feeling RIGHT, confusion, initial encounter EXAM: CT HEAD WITHOUT CONTRAST TECHNIQUE: Contiguous axial images were obtained from the base of the skull through the vertex without intravenous contrast. COMPARISON:  10/19/2014 FINDINGS: Beam hardening artifacts from aneurysm clip at expected position of distal RIGHT ICA. RIGHT parietal intracranial shunt with tip projecting at LEFT internal capsule, unchanged. Normal ventricular morphology. No midline shift or mass effect. Prominence of the subarachnoid space anteriorly from diffuse atrophy. No intracranial hemorrhage, mass lesion, or evidence acute infarction. No extra-axial fluid collections. Small air-fluid level LEFT maxillary sinus. Remaining sinuses and bones unremarkable. Atherosclerotic calcifications at the carotid siphons. IMPRESSION: Post aneurysm clipping. Unchanged appearance of intracranial shunt. No acute intracranial abnormalities. Electronically Signed   By: Lavonia Dana M.D.   On: 03/24/2015 11:08   I have personally reviewed and evaluated these images and lab results as part of my medical decision-making.   EKG Interpretation None      MDM   Final diagnoses:  Confusion  Insomnia    81 year old female with a history of nontraumatic subarachnoid hemorrhage, memory loss, VP shunt placed 03/15/2014, dyslipidemia presents with concern for 3 days of confusion.  CT head performed showing no acute abnormalities.  Patient is alert, oriented, with normal speech, normal neurologic exam and have low suspicion for acute CVA.  Urinalysis shows no signs of UTI. CBC shows no leukocytosis or neck and no anemia. Electrolytes are within normal limits.  No fevers and doubt meningitis or other significant bacterial infection.  X-ray shunt series was performed which showed no shunt discontinuity.  Patient does have a history of cognitive and memory issues since [facility] for which she seen by neurology, has not been sleeping well.  She describes a mild headache, however history is not consistent with SAH.   Of note, patient had an atypical presentation for [facility] on prior admission, with significant AMS initially thought to be UTI with discovery of aneurysm and LP showing SAH.  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  Discussed with patient and grandson patient's atypical historical presentation of [place], and agree that symptoms today are not as severe as they had been.  Patient is alert, oriented, with normal neurologic exam, mild headache, eating a sandwich, answering questions appropriately, and do not feel presentation is consistent with [facility] or feel that proceeding with LP/further imaging would be appropriate in this situation.  However, given her history discussed strict return precautions including return for severe or acute onset headache, worsening confusion, neurologic deficits or other concerning symptoms.  Discussed increased confusion may be secondary to decreased  sleep over last few nights. Grandson will stay with patient and both feel comfortable with the plan. Patient discharged in stable condition with understanding of reasons to return.        Gareth Morgan, MD 03/24/15 [phone]

## 2015-03-24 NOTE — ED Notes (Signed)
Per family pt sts hx of UTI with similar; pt family sts confusion and AMS x 3 days

## 2015-03-24 NOTE — ED Notes (Addendum)
Patient states her head is really aching right now.

## 2015-04-15 DIAGNOSIS — R4189 Other symptoms and signs involving cognitive functions and awareness: Secondary | ICD-10-CM | POA: Diagnosis not present

## 2015-04-15 DIAGNOSIS — F4323 Adjustment disorder with mixed anxiety and depressed mood: Secondary | ICD-10-CM | POA: Diagnosis not present

## 2015-05-04 DIAGNOSIS — R4189 Other symptoms and signs involving cognitive functions and awareness: Secondary | ICD-10-CM | POA: Diagnosis not present

## 2015-05-04 DIAGNOSIS — F4323 Adjustment disorder with mixed anxiety and depressed mood: Secondary | ICD-10-CM | POA: Diagnosis not present

## 2015-05-06 DIAGNOSIS — F4323 Adjustment disorder with mixed anxiety and depressed mood: Secondary | ICD-10-CM | POA: Diagnosis not present

## 2015-05-06 DIAGNOSIS — R4189 Other symptoms and signs involving cognitive functions and awareness: Secondary | ICD-10-CM | POA: Diagnosis not present

## 2015-05-17 ENCOUNTER — Ambulatory Visit (INDEPENDENT_AMBULATORY_CARE_PROVIDER_SITE_OTHER): Payer: Medicare Other | Admitting: Neurology

## 2015-05-17 ENCOUNTER — Encounter: Payer: Self-pay | Admitting: Neurology

## 2015-05-17 ENCOUNTER — Telehealth: Payer: Self-pay

## 2015-05-17 VITALS — BP 102/62 | HR 84 | Resp 20 | Ht 62.0 in | Wt 120.0 lb

## 2015-05-17 DIAGNOSIS — I609 Nontraumatic subarachnoid hemorrhage, unspecified: Secondary | ICD-10-CM

## 2015-05-17 MED ORDER — MEMANTINE HCL ER 28 MG PO CP24: 28.0000 mg | ORAL_CAPSULE | Freq: Every day | ORAL | Status: DC

## 2015-05-17 NOTE — Patient Instructions (Signed)
Memantine Tablets What is this medicine? MEMANTINE (MEM an teen) is used to treat dementia caused by Alzheimer's disease. This medicine may be used for other purposes; ask your health care provider or pharmacist if you have questions. What should I tell my health care provider before I take this medicine? They need to know if you have any of these conditions: -difficulty passing urine -kidney disease -liver disease -seizures -an unusual or allergic reaction to memantine, other medicines, foods, dyes, or preservatives -pregnant or trying to get pregnant -breast-feeding How should I use this medicine? Take this medicine by mouth with a glass of water. Follow the directions on the prescription label. You may take this medicine with or without food. Take your doses at regular intervals. Do not take your medicine more often than directed. Continue to take your medicine even if you feel better. Do not stop taking except on the advice of your doctor or health care professional. Talk to your pediatrician regarding the use of this medicine in children. Special care may be needed. Overdosage: If you think you have taken too much of this medicine contact a poison control center or emergency room at once. NOTE: This medicine is only for you. Do not share this medicine with others. What if I miss a dose? If you miss a dose, take it as soon as you can. If it is almost time for your next dose, take only that dose. Do not take double or extra doses. If you do not take your medicine for several days, contact your health care provider. Your dose may need to be changed. What may interact with this medicine? -acetazolamide -amantadine -cimetidine -dextromethorphan -dofetilide -hydrochlorothiazide -ketamine -metformin -methazolamide -quinidine -ranitidine -sodium bicarbonate -triamterene This list may not describe all possible interactions. Give your health care provider a list of all the medicines,  herbs, non-prescription drugs, or dietary supplements you use. Also tell them if you smoke, drink alcohol, or use illegal drugs. Some items may interact with your medicine. What should I watch for while using this medicine? Visit your doctor or health care professional for regular checks on your progress. Check with your doctor or health care professional if there is no improvement in your symptoms or if they get worse. You may get drowsy or dizzy. Do not drive, use machinery, or do anything that needs mental alertness until you know how this drug affects you. Do not stand or sit up quickly, especially if you are an older patient. This reduces the risk of dizzy or fainting spells. Alcohol can make you more drowsy and dizzy. Avoid alcoholic drinks. What side effects may I notice from receiving this medicine? Side effects that you should report to your doctor or health care professional as soon as possible: -allergic reactions like skin rash, itching or hives, swelling of the face, lips, or tongue -agitation or a feeling of restlessness -depressed mood -dizziness -hallucinations -redness, blistering, peeling or loosening of the skin, including inside the mouth -seizures -vomiting Side effects that usually do not require medical attention (report to your doctor or health care professional if they continue or are bothersome): -constipation -diarrhea -headache -nausea -trouble sleeping This list may not describe all possible side effects. Call your doctor for medical advice about side effects. You may report side effects to FDA at 1-800-FDA-1088. Where should I keep my medicine? Keep out of the reach of children. Store at room temperature between 15 degrees and 30 degrees C (59 degrees and 86 degrees F). Throw away any   unused medicine after the expiration date. NOTE: This sheet is a summary. It may not cover all possible information. If you have questions about this medicine, talk to your doctor,  pharmacist, or health care provider.    2016, [employer]. (2012-12-08 14:10:42)  

## 2015-05-17 NOTE — Telephone Encounter (Signed)
[place] faxed to [facility] pharmacy, receipt of confirmation received.

## 2015-05-17 NOTE — Progress Notes (Signed)
[facility]  PATIENT: Sharon [name] DOB: 08-15-1934   REASON FOR VISIT: Follow-up for nontraumatic subarachnoid hemorrhage, memory loss HISTORY FROM: Patient    HISTORY OF PRESENT ILLNESS:   Sharon [name] is here for a routine revisit from 05/17/2015. She has been on Nicotrol patches trying to quit smoking and hasn't had a cigarette in over a month. She reports no confusion she had an auto fall in generally in the backyard of her home and her grandson brought her to the emergency room were no abnormalities were found no infection or metabolic abnormality no x-ray injuries. She has not been hospitalized. She is driving. She is working and her own shop. She went to an antique fair in [place], [place].  She is independent in all activities of daily living reveals today a marker test and she scored 25 out of 30 points with 13  Points on word fluency test results.  This is to points better than her last test results from January. She is sleeping well her appetite is good, she is taking yoga classes.  She is willing to move to [facility] and planning her transition. She has a close friend there and is looking forward to his company.  She appears perfectly well adjusted . I would not refrain her from driving. I will follow up q 6 month with MOCA and MMSE.    03-10-15 ,CM Sharon [name], 81 year old female returns for follow-up. She was last seen 08/16/14.  She has a history of memory loss and nontraumatic subarachnoid hemorrhage. Her  Aricept was reduced to 5 mg daily at that visit due to diarrhea. Since that time she called back in expected Dr. Brett Fairy on 10/25/2014 who told her to stop the Aricept altogether and began a Namenda starter pack which she picked up from her pharmacy and took the first month. According to her pharmacy today her refills have never been picked up. She reports that her memory is stable. She no longer has caregiver in the home. She is back to driving and has not had  any problems. MOCA test today 23 out of 30. Last was 26 out of 30 .review She reports that her appetite is good and she is sleeping well. She is taking yoga .She returns for reevaluation   UPDATE 4-4-16CD The meeting today for a prolonged revisit appointment of 30 minutes. Sharon [name] is here today with a caretaker, [name], and appears very alert and concentrated. She is reporting depression, but also hope, She has reported diarrhea, and suspects that either her bacterial setting is disturbed from ATB , or she may have contracted clostridium . Be repeated today and MMSE that the patient had already performed the last time at 23 out of 30 points today she scored 27 out of 30 points. She recalled 2 day 2 of the 3 words that she missed last time was able to do the serial sevens completely she was not sure about the season and also the spring she stated it was fall. She was able to copy an object and clock draw a clock face correctly she named 9 animals in the animal fluency test and she wrote a perfect sentence writing. Over 2 to make it a little bit more difficult she scored 25 out of 30 on the much more difficult Montral assessment test.I also have a transcranial Doppler study available here as was normal as flow direction and velocity in all identified vessels in expected sinus no evidence of stenosis basilar spasm or  occlusion. The patient fell asleep doing the test and the technician noted that she was snoring. Her VP shunt performs well. She is much improved.  As she has no longer confusion, she keeps active in conversation and is interested in daily activities. I will ask her to come back in June and likely will give her permission d to drive if her recovery is sustained. I would like her to try the Ritalin that she was prescribed in [facility]. I will increase aricept to 10 mg now form 5 mg.   HISTORY:Sharon [name] and had a very eventful end of the last year . He was suspected to have a UTI  causing confusion, and was admitted on 02-28-14 , given multiple doses of ativan - Her agitation got worse. The attempt to do a lumbar puncture was unsuccessful. She needed sedation for an MRI, which did showed an aneurysm. Under the MRI sedation a LP was finally done, the CSF was cloudy. 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 She was coiled by Dr. Estanislado Pandy .  [name]- but a communicating hydrocephalus developed nonetheless, and a VP shunt was placed on Jan. 11th 2016 by Dr. Trenton Gammon. Since the aneurysm bleed occurred the patient also developed a urinary tract infection indeed and 100,000 colonies were seen on 04/11/2014 in her urinary sample that was obtained while she was in rehabilitation at [facility]. The main concern now is that this 81 year old right-handed Caucasian lady has still continued to have some altered mental status. She is not quite as concentrated and focused and she is repetitive.   The patient used to live independently prior to December 2015. She was referred referred from hospital to come in place for rehabilitation per physical and occupational therapy were ongoing. She was placed on the more top to prevent muscle spasms. Decadron protocol was directed. In generally the patient still had some low-grade fevers that maintained on broad-spectrum antibiotics these stopped. She tends to confabulate during the exam and she has mild left-sided dysmetria and ataxia clearly related to the right-sided lesion. She is easily distracted. She was placed on Ritalin at bedtime to help improve anytime arousal focus attention and issues in regards to mental status. She is developed insomnia which is very common after an aneurysm bleed. She sometimes restless but she is doing much better than she has a visitor that she recognizes and trusts. She is currently on Pepcid, on Ritalin,  Zocor at bedtime only. She was on a mechanical soft diet and thin liquids. Diet changed during her [place] rehabilitation stay to a regular diet. I was able to review the patient's recent lab results from 04-13-14 showed a normal CBC and differential her platelet count was 463,000. Her urinalysis confirmed 2 days after the beginning of Macrobid only few bacteria. Hepatic metabolic panel was entirely normal including creatinine and potassium and sodium. I did not see a TSH. However the TSH would unlikely be affected with and only 6 weeks after an aneurysm bleed. I will refer the patient also for transcranial Doppler study to make sure that there is no muscle spasm at the current time. She does not complain about headaches right now. Neither the patient nor her daughter confirm any nausea the last couple of days. She has no neck stiffness.    REVIEW OF SYSTEMS: Full 14 system review of systems performed and notable only for those listed, all others are neg:  Mrs. Fason  reports no recent confusional episodes, she does not appear depressed and her geriatric depression score is endorsed at only 2 points, she scored fine in her Montral cognitive assessment test at 25 out of 30 points. She missed to recall words areas she was fine in trail making, could draw a cube and a clock face visual spatial and orientation were all intact. I would like for her to leave a stool sample today take your take the tube with her to rule out that her diarrhea is related to an infection and perhaps not to medication at all.  ALLERGIES: No Known Allergies  HOME MEDICATIONS: Outpatient Prescriptions Prior to Visit  Medication Sig Dispense Refill  . cholecalciferol (VITAMIN D) 1000 UNITS tablet Take 1,000 Units by mouth daily.    Marland Kitchen denosumab (PROLIA) 60 MG/ML SOLN injection Inject 60 mg into the skin every 6 (six) months. Administer in upper arm, thigh, or abdomen    . glucosamine-chondroitin 500-400 MG tablet Take 1 tablet by mouth  at bedtime.     . memantine (NAMENDA XR) 28 MG CP24 24 hr capsule Take 1 capsule (28 mg total) by mouth daily. 30 capsule 3  . polyethylene glycol-electrolytes (NULYTELY/GOLYTELY) 420 G solution See admin instructions.  0  . Prenatal Vit-Fe Fumarate-FA (PRENATAL MULTIVITAMIN) TABS tablet Take 1 tablet by mouth daily at 12 noon.    . Probiotic Product (PHILLIPS COLON HEALTH PO) Take 1 tablet by mouth 2 (two) times daily.    Marland Kitchen RENOVA 0.02 % CREA Apply 1 application topically at bedtime.   3  . sertraline (ZOLOFT) 50 MG tablet 1 TABLET ONCE A DAY ORALLY 30 DAY(S)  12  . simvastatin (ZOCOR) 40 MG tablet Take 40 mg by mouth daily.   3  . vitamin E 100 UNIT capsule Take 100 Units by mouth daily.     No facility-administered medications prior to visit.    PAST MEDICAL HISTORY: Past Medical History  Diagnosis Date  . Dyslipidemia   . Cystitis 03/02/2014    acute   . UTI (urinary tract infection) 03/02/2014  . Arthritis     osteo  . Hearing loss bil    got hearing aides.     PAST SURGICAL HISTORY: Past Surgical History  Procedure Laterality Date  . Abdominal hysterectomy    . Radiology with anesthesia N/A 03/04/2014    Procedure: RADIOLOGY WITH ANESTHESIA;  Surgeon: Rob Hickman, MD;  Location: [facility];  Service: Radiology;  Laterality: N/A;  . Ventriculoperitoneal shunt Right 03/15/2014    Procedure: SHUNT INSERTION VENTRICULAR-PERITONEAL;  Surgeon: Charlie Pitter, MD;  Location: [facility];  Service: Neurosurgery;  Laterality: Right;  . Radiology with anesthesia N/A 03/04/2014    Procedure: RADIOLOGY WITH ANESTHESIA;  Surgeon: Medication Radiologist, MD;  Location: [facility];  Service: Radiology;  Laterality: N/A;    FAMILY HISTORY: Family History  Problem Relation Age of Onset  . Hypertension Mother   . Hypertension Father     SOCIAL HISTORY: Social History   Social History  . Marital Status: Widowed    Spouse Name: N/A  . Number of Children: 4  . Years of Education: hs  grad   Occupational History  . Not on file.   Social History Main Topics  . Smoking status: Current Every Day Smoker -- 0.50 packs/day    Types: Cigarettes    Last Attempt to Quit: 02/28/2014  . Smokeless tobacco: Never Used  . Alcohol Use: 0.0 oz/week    0 Standard drinks or  equivalent per week     Comment: recovering alcoholic- started drinking 2 weeks ago (07-04-14), stated not drinking. 08-16-14  . Drug Use: No  . Sexual Activity: Not on file   Other Topics Concern  . Not on file   Social History Narrative   Caffeine 1 cup am avg.     PHYSICAL EXAM  Filed Vitals:   05/17/15 1323  BP: 102/62  Pulse: 84  Resp: 20  Height: 5\' 2"  (1.575 m)  Weight: 120 lb (54.432 kg)   Body mass index is 21.94 kg/(m^2).   Generalized: Well developed, well groomed in no acute distress  Head: normocephalic and atraumatic. Oropharynx benign  Neck: Supple, no carotid bruits  Cardiac: Regular rate rhythm, no murmur  Musculoskeletal: No deformity   Neurological examination;  Mentation: Alert oriented to time, place, history taking. Attention span and concentration appropriate.   Montreal Cognitive Assessment  05/17/2015 02/15/2015  Visuospatial/ Executive (0/5) 5 4  Naming (0/3) 3 3  Attention: Read list of digits (0/2) 1 1  Attention: Read list of letters (0/1) 1 1  Attention: Serial 7 subtraction starting at 100 (0/3) 2 3  Language: Repeat phrase (0/2) 2 2  Language : Fluency (0/1) 1 0  Abstraction (0/2) 2 1  Delayed Recall (0/5) 3 2  Orientation (0/6) 5 6  Total 25 23  Adjusted Score (based on education) 25 23     Cranial nerve :  Pupils were equal round reactive to light extraocular movements were full, visual field were full on confrontational test. Facial sensation and strength were normal. hearing was intact to finger rubbing bilaterally. Uvula tongue midline. head turning and shoulder shrug were normal and symmetric.Tongue protrusion into cheek strength was  normal. Motor: normal bulk and tone, full strength in the BUE, BLE, fine finger movements normal, no pronator drift. No focal weakness Sensory: normal and symmetric to light touch, pinprick, and Vibration,  Coordination: finger-nose-finger, heel-to-shin bilaterally, no dysmetria Reflexes: Brachioradialis 2/2, biceps 2/2, triceps 2/2, patellar 2/2, Achilles 2/2, plantar responses were flexor bilaterally. Gait and Station: Rising up from seated position without assistance, normal stance, moderate stride, good arm swing, smooth turning, able to perform tiptoe, and heel walking without difficulty. Tandem gait is mildly unsteady. No assistive device  DIAGNOSTIC DATA (LABS, IMAGING, TESTING) - I reviewed patient records, labs, notes, testing and imaging myself where available.  Lab Results  Component Value Date   WBC 7.4 03/24/2015   HGB 15.4* 03/24/2015   HCT 46.7* 03/24/2015   MCV 98.1 03/24/2015   PLT 253 03/24/2015      Component Value Date/Time   NA 137 03/24/2015 1009   K 4.2 03/24/2015 1009   CL 98* 03/24/2015 1009   CO2 28 03/24/2015 1009   GLUCOSE 140* 03/24/2015 1009   BUN 12 03/24/2015 1009   CREATININE 0.87 03/24/2015 1009   CALCIUM 9.6 03/24/2015 1009   PROT 6.8 03/24/2015 1009   ALBUMIN 3.9 03/24/2015 1009   AST 24 03/24/2015 1009   ALT 19 03/24/2015 1009   ALKPHOS 56 03/24/2015 1009   BILITOT 0.6 03/24/2015 1009   GFRNONAA >60 03/24/2015 1009   GFRAA >60 03/24/2015 1009   Lab Results  Component Value Date   CHOL 150 03/10/2014   HDL 61 03/10/2014   LDLCALC 74 03/10/2014   TRIG 77 03/10/2014   CHOLHDL 2.5 03/10/2014    Lab Results  Component Value Date   [id number] 03/10/2014   Lab Results  Component Value Date   TSH  1.683 03/11/2014      ASSESSMENT AND PLAN 81 y.o. 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 She is now back to driving. She is having diarrhea on Aricept and that  was discontinued by Dr. Brett Fairy 10/25/14 .  She was placed on a Namenda starter pack and never picked up the refill medication so she has been off medication for 2 months.  Will restart starter pack of Namenda then resume 28 mg daily, confirmed with pharmacy that she never picked up maintenance dose. i will order a stool sample, perhaps its not medication but clostridium ?  Stopped aricept 4 month ago.  Instructed patient to take starter pack then pick up the maintenance dose. She used probiotics. She is taking a statin.  Diarrhea - ? Namenda maintenance dose prescribed.    [place], [facility] [address], [place], [place] [address] [phone]

## 2015-05-18 ENCOUNTER — Telehealth: Payer: Self-pay | Admitting: Neurology

## 2015-05-18 NOTE — Telephone Encounter (Signed)
Patient called to advise she is unable to bring stool sample by today, did not have diarrhea today (1st time in 3 weeks), will bring by tomorrow.

## 2015-05-18 NOTE — Telephone Encounter (Signed)
Noted, thanks!

## 2015-05-19 ENCOUNTER — Other Ambulatory Visit (INDEPENDENT_AMBULATORY_CARE_PROVIDER_SITE_OTHER): Payer: Self-pay

## 2015-05-19 DIAGNOSIS — I609 Nontraumatic subarachnoid hemorrhage, unspecified: Secondary | ICD-10-CM

## 2015-05-19 DIAGNOSIS — Z0289 Encounter for other administrative examinations: Secondary | ICD-10-CM

## 2015-05-19 NOTE — Addendum Note (Signed)
Addended by: Lester  A on: 05/19/2015 01:02 PM   Modules accepted: Orders

## 2015-05-19 NOTE — Addendum Note (Signed)
Addended by: Lester Abeytas A on: 05/19/2015 01:33 PM   Modules accepted: Orders

## 2015-05-21 LAB — CLOSTRIDIUM DIFFICILE EIA: C DIFFICILE TOXINS A+ B, EIA: NEGATIVE

## 2015-05-23 ENCOUNTER — Telehealth: Payer: Self-pay

## 2015-05-23 NOTE — Telephone Encounter (Signed)
Called to advise pt that her stool sample was negative for cdiff and parasites. No answer, left a message asking her to call me back.

## 2015-05-23 NOTE — Telephone Encounter (Signed)
-----   Message from Larey Seat, MD sent at 05/23/2015 11:03 AM EDT ----- Negative for clostridium, no parasitary finding. CD

## 2015-05-24 NOTE — Telephone Encounter (Signed)
Called pt again, no answer, left a message on her cell phone asking her to call me back.

## 2015-05-24 NOTE — Telephone Encounter (Signed)
Patient returned Kristen's call. Please call Work# [phone].

## 2015-05-24 NOTE — Telephone Encounter (Signed)
I spoke to pt and advised her that her stool sample was negative for cdiff and for parasites. Pt states that she is still having diarrhea. I advised her to discuss with her PCP. Pt verbalized understanding.

## 2015-06-07 ENCOUNTER — Other Ambulatory Visit: Payer: Self-pay

## 2015-06-07 DIAGNOSIS — Z0000 Encounter for general adult medical examination without abnormal findings: Secondary | ICD-10-CM | POA: Diagnosis not present

## 2015-06-07 DIAGNOSIS — M810 Age-related osteoporosis without current pathological fracture: Secondary | ICD-10-CM | POA: Diagnosis not present

## 2015-06-07 DIAGNOSIS — F0390 Unspecified dementia without behavioral disturbance: Secondary | ICD-10-CM | POA: Diagnosis not present

## 2015-06-07 DIAGNOSIS — F39 Unspecified mood [affective] disorder: Secondary | ICD-10-CM | POA: Diagnosis not present

## 2015-06-07 DIAGNOSIS — Z1231 Encounter for screening mammogram for malignant neoplasm of breast: Secondary | ICD-10-CM

## 2015-06-07 DIAGNOSIS — I609 Nontraumatic subarachnoid hemorrhage, unspecified: Secondary | ICD-10-CM | POA: Diagnosis not present

## 2015-06-07 DIAGNOSIS — B009 Herpesviral infection, unspecified: Secondary | ICD-10-CM | POA: Diagnosis not present

## 2015-06-07 DIAGNOSIS — A09 Infectious gastroenteritis and colitis, unspecified: Secondary | ICD-10-CM | POA: Diagnosis not present

## 2015-06-07 DIAGNOSIS — G919 Hydrocephalus, unspecified: Secondary | ICD-10-CM | POA: Diagnosis not present

## 2015-06-07 DIAGNOSIS — E7800 Pure hypercholesterolemia, unspecified: Secondary | ICD-10-CM | POA: Diagnosis not present

## 2015-06-08 ENCOUNTER — Other Ambulatory Visit: Payer: Self-pay | Admitting: Neurology

## 2015-06-16 DIAGNOSIS — L2081 Atopic neurodermatitis: Secondary | ICD-10-CM | POA: Diagnosis not present

## 2015-06-16 DIAGNOSIS — L570 Actinic keratosis: Secondary | ICD-10-CM | POA: Diagnosis not present

## 2015-06-16 DIAGNOSIS — L578 Other skin changes due to chronic exposure to nonionizing radiation: Secondary | ICD-10-CM | POA: Diagnosis not present

## 2015-06-16 DIAGNOSIS — L814 Other melanin hyperpigmentation: Secondary | ICD-10-CM | POA: Diagnosis not present

## 2015-06-28 ENCOUNTER — Ambulatory Visit: Payer: PRIVATE HEALTH INSURANCE

## 2015-07-07 ENCOUNTER — Ambulatory Visit
Admission: RE | Admit: 2015-07-07 | Discharge: 2015-07-07 | Disposition: A | Discharge status: Home / Self Care | Payer: Medicare Other | Source: Ambulatory Visit

## 2015-07-07 DIAGNOSIS — Z1231 Encounter for screening mammogram for malignant neoplasm of breast: Secondary | ICD-10-CM

## 2015-07-08 ENCOUNTER — Ambulatory Visit: Payer: PRIVATE HEALTH INSURANCE

## 2015-07-21 DIAGNOSIS — M8589 Other specified disorders of bone density and structure, multiple sites: Secondary | ICD-10-CM | POA: Diagnosis not present

## 2015-07-21 DIAGNOSIS — M859 Disorder of bone density and structure, unspecified: Secondary | ICD-10-CM | POA: Diagnosis not present

## 2015-08-16 ENCOUNTER — Ambulatory Visit: Payer: Medicare Other | Admitting: Neurology

## 2015-08-25 DIAGNOSIS — M810 Age-related osteoporosis without current pathological fracture: Secondary | ICD-10-CM | POA: Diagnosis not present

## 2015-11-21 ENCOUNTER — Ambulatory Visit (INDEPENDENT_AMBULATORY_CARE_PROVIDER_SITE_OTHER): Payer: Medicare Other | Admitting: Neurology

## 2015-11-21 ENCOUNTER — Encounter: Payer: Self-pay | Admitting: Neurology

## 2015-11-21 VITALS — BP 112/62 | HR 68 | Resp 20 | Ht 63.0 in | Wt 124.0 lb

## 2015-11-21 DIAGNOSIS — G3184 Mild cognitive impairment, so stated: Secondary | ICD-10-CM
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
## 2015-11-21 NOTE — Patient Instructions (Signed)
Drive in daytime and only in good visability. No high way driving.

## 2015-12-20 DIAGNOSIS — L2081 Atopic neurodermatitis: Secondary | ICD-10-CM | POA: Diagnosis not present

## 2015-12-20 DIAGNOSIS — L814 Other melanin hyperpigmentation: Secondary | ICD-10-CM | POA: Diagnosis not present

## 2015-12-20 DIAGNOSIS — L570 Actinic keratosis: Secondary | ICD-10-CM | POA: Diagnosis not present

## 2016-01-05 ENCOUNTER — Telehealth: Payer: Self-pay

## 2016-01-16 ENCOUNTER — Encounter: Payer: Self-pay | Admitting: Family Medicine

## 2016-01-24 DIAGNOSIS — L814 Other melanin hyperpigmentation: Secondary | ICD-10-CM | POA: Diagnosis not present

## 2016-01-24 DIAGNOSIS — L570 Actinic keratosis: Secondary | ICD-10-CM | POA: Diagnosis not present

## 2016-02-22 DIAGNOSIS — M810 Age-related osteoporosis without current pathological fracture: Secondary | ICD-10-CM | POA: Diagnosis not present

## 2016-02-26 ENCOUNTER — Emergency Department

## 2016-02-26 ENCOUNTER — Inpatient Hospital Stay
Admit: 2016-02-26 | Discharge: 2016-02-26 | Disposition: A | Discharge status: Home / Self Care | Payer: MEDICARE | Attending: Emergency Medicine

## 2016-02-26 ENCOUNTER — Emergency Department: Admit: 2016-02-26 | Payer: MEDICARE | Primary: Family Medicine

## 2016-02-26 DIAGNOSIS — N390 Urinary tract infection, site not specified: Secondary | ICD-10-CM

## 2016-02-26 DIAGNOSIS — Z87891 Personal history of nicotine dependence: Secondary | ICD-10-CM | POA: Diagnosis not present

## 2016-02-26 DIAGNOSIS — R4182 Altered mental status, unspecified: Secondary | ICD-10-CM | POA: Diagnosis not present

## 2016-02-26 DIAGNOSIS — Z982 Presence of cerebrospinal fluid drainage device: Secondary | ICD-10-CM | POA: Diagnosis not present

## 2016-02-26 DIAGNOSIS — Z8673 Personal history of transient ischemic attack (TIA), and cerebral infarction without residual deficits: Secondary | ICD-10-CM | POA: Diagnosis not present

## 2016-02-26 LAB — EKG, 12 LEAD, INITIAL
Atrial Rate: 75 {beats}/min
Calculated P Axis: 66 degrees
Calculated R Axis: 63 degrees
Calculated T Axis: 52 degrees
Diagnosis: NORMAL
P-R Interval: 172 ms
Q-T Interval: 372 ms
QRS Duration: 84 ms
QTC Calculation (Bezet): 415 ms
Ventricular Rate: 75 {beats}/min

## 2016-02-26 LAB — CBC WITH AUTOMATED DIFF
ABS. BASOPHILS: 0 10*3/uL (ref 0.0–0.1)
ABS. EOSINOPHILS: 0.1 10*3/uL (ref 0.0–0.4)
ABS. LYMPHOCYTES: 0.8 10*3/uL (ref 0.8–3.5)
ABS. MONOCYTES: 0.7 10*3/uL (ref 0.0–1.0)
ABS. NEUTROPHILS: 4.7 10*3/uL (ref 1.8–8.0)
BASOPHILS: 0 % (ref 0–1)
EOSINOPHILS: 1 % (ref 0–7)
HCT: 41.6 % (ref 35.0–47.0)
HGB: 13.4 g/dL (ref 11.5–16.0)
LYMPHOCYTES: 12 % (ref 12–49)
MCH: 31.2 PG (ref 26.0–34.0)
MCHC: 32.2 g/dL (ref 30.0–36.5)
MCV: 97 FL (ref 80.0–99.0)
MONOCYTES: 11 % (ref 5–13)
NEUTROPHILS: 76 % — ABNORMAL HIGH (ref 32–75)
PLATELET: 209 10*3/uL (ref 150–400)
RBC: 4.29 M/uL (ref 3.80–5.20)
RDW: 13.7 % (ref 11.5–14.5)
WBC: 6.3 10*3/uL (ref 3.6–11.0)

## 2016-02-26 LAB — METABOLIC PANEL, COMPREHENSIVE
A-G Ratio: 0.9 — ABNORMAL LOW (ref 1.1–2.2)
ALT (SGPT): 23 U/L (ref 12–78)
AST (SGOT): 17 U/L (ref 15–37)
Albumin: 3.3 g/dL — ABNORMAL LOW (ref 3.5–5.0)
Alk. phosphatase: 61 U/L (ref 45–117)
Anion gap: 7 mmol/L (ref 5–15)
BUN/Creatinine ratio: 22 — ABNORMAL HIGH (ref 12–20)
BUN: 17 MG/DL (ref 6–20)
Bilirubin, total: 0.3 MG/DL (ref 0.2–1.0)
CO2: 27 mmol/L (ref 21–32)
Calcium: 8.1 MG/DL — ABNORMAL LOW (ref 8.5–10.1)
Chloride: 104 mmol/L (ref 97–108)
Creatinine: 0.78 MG/DL (ref 0.55–1.02)
GFR est AA: >60 mL/min/{1.73_m2} (ref >60)
GFR est non-AA: >60 mL/min/{1.73_m2} (ref >60)
Globulin: 3.5 g/dL (ref 2.0–4.0)
Glucose: 91 mg/dL (ref 65–100)
Potassium: 3.9 mmol/L (ref 3.5–5.1)
Protein, total: 6.8 g/dL (ref 6.4–8.2)
Sodium: 138 mmol/L (ref 136–145)

## 2016-02-26 LAB — CK W/ REFLX CKMB: CK: 72 U/L (ref 26–192)

## 2016-02-26 LAB — URINALYSIS W/MICROSCOPIC
Bilirubin: NEGATIVE
Glucose: NEGATIVE mg/dL
Ketone: NEGATIVE mg/dL
Nitrites: POSITIVE — AB
Protein: 30 mg/dL — AB
Specific gravity: 1.024 (ref 1.003–1.030)
Urobilinogen: 1 EU/dL (ref 0.2–1.0)
pH (UA): 6 (ref 5.0–8.0)

## 2016-02-26 LAB — URINE CULTURE HOLD SAMPLE

## 2016-02-26 LAB — TROPONIN I: Troponin-I, Qt.: <0.04 ng/mL (ref <0.05)

## 2016-02-26 LAB — EKG 12-LEAD
Atrial Rate: 75 {beats}/min
Diagnosis: NORMAL
P Axis: 66 degrees
P-R Interval: 172 ms
Q-T Interval: 372 ms
QRS Duration: 84 ms
QTc Calculation (Bazett): 415 ms
R Axis: 63 degrees
T Axis: 52 degrees
Ventricular Rate: 75 {beats}/min

## 2016-02-26 MED ORDER — CEPHALEXIN 500 MG CAP
500 mg | ORAL_CAPSULE | Freq: Three times a day (TID) | ORAL | 0 refills | Status: AC
Start: 2016-02-26 — End: 2016-03-04

## 2016-02-26 MED ORDER — CEPHALEXIN 500 MG CAP
500 mg | ORAL | Status: AC
Start: 2016-02-26 — End: 2016-02-26
  Administered 2016-02-26: 19:00:00 via ORAL

## 2016-02-26 MED FILL — CEPHALEXIN 500 MG CAP: 500 mg | ORAL | Qty: 1

## 2016-02-26 NOTE — ED Provider Notes (Signed)
HPI Comments: 81 y.o. female with past medical history significant for aneurysm and stroke who presents, accompanied by daughter, for evaluation of altered mental status.  Per daughter, patient with progressively worsening altered mental status over the past 3 days.  Daughter reports two days ago patient was "not quite herself," then yesterday afternoon she was significantly more confused.  Daughter had difficulty waking patient up this morning so she brought her to ED.  Daughter is concerned because approximately 1 year ago patient had a brain aneurysm and stroke.  Daughter also notes patient has a history of confusion with UTI.  Patient denies headache, extremity weakness/numbness, speech difficulty, and any other acute concerns.    Social hx: Former smoker  PCP: No primary care provider on file.    Full history, physical exam, and ROS unable to be obtained due to:  confusion.  Note written by Ander Purpura A. Kathlen Mody, Education administrator, as dictated by Hillard Danker, MD 12:54 PM      The history is provided by the patient and a relative. The history is limited by the condition of the patient.        Past Medical History:   Diagnosis Date   ??? Stroke [facility])     Aneurysm       History reviewed. No pertinent surgical history.      History reviewed. No pertinent family history.    Social History     Social History   ??? Marital status: N/A     Spouse name: N/A   ??? Number of children: N/A   ??? Years of education: N/A     Occupational History   ??? Not on file.     Social History Main Topics   ??? Smoking status: Former Smoker   ??? Smokeless tobacco: Never Used   ??? Alcohol use No   ??? Drug use: No   ??? Sexual activity: Not on file     Other Topics Concern   ??? Not on file     Social History Narrative   ??? No narrative on file         ALLERGIES: Review of patient's allergies indicates no known allergies.    Review of Systems   Unable to perform ROS: Other   Psychiatric/Behavioral: Positive for confusion.       Vitals:    02/26/16 1228    BP: 125/81   Pulse: 90   Resp: 20   Temp: 98 ??F (36.7 ??C)   SpO2: 94%   Weight: 60.8 kg (134 lb)   Height: 5' 4"  (1.626 m)            Physical Exam   Constitutional: She is oriented to person, place, and time. She appears well-developed and well-nourished.   HENT:   Head: Normocephalic and atraumatic.   Eyes: Conjunctivae and EOM are normal.   Neck: Normal range of motion.   Cardiovascular: Normal rate, regular rhythm, normal heart sounds and intact distal pulses.    No murmur heard.  Pulmonary/Chest: Effort normal and breath sounds normal. No stridor. No respiratory distress.   Abdominal: Soft. Bowel sounds are normal. There is no tenderness.   Musculoskeletal: Normal range of motion. She exhibits no edema, tenderness or deformity.   Neurological: She is oriented to person, place, and time. No cranial nerve deficit.   Awake, alert, following commands   Skin: Skin is warm and dry. She is not diaphoretic.   Psychiatric: She has a normal mood and affect.   Nursing note  and vitals reviewed.       MDM  Number of Diagnoses or Management Options  Altered mental status, unspecified altered mental status type:   Urinary tract infection without hematuria, site unspecified:   Diagnosis management comments: Per family patient is not herself, patient admits to increased lethargy and confusion from baseline.  Check labs and head CT (patient with hx brain bleed and has a shunt) check urine for UTI.    DDX - uti, shunt issue, new brain icb, other occult infection       Amount and/or Complexity of Data Reviewed  Clinical lab tests: reviewed and ordered  Tests in the radiology section of CPT??: reviewed and ordered      ED Course       Procedures    ED EKG interpretation:  Rhythm: normal sinus rhythm; and regular . Rate (approx.): 75; No acute changes.  Note written by Ander Purpura A. Kathlen Mody, Education administrator, as dictated by Hillard Danker, MD 12:57 PM                      Recent Results (from the past 24 hour(s))   EKG, 12 LEAD, INITIAL     Collection Time: 02/26/16 12:42 PM   Result Value Ref Range    Ventricular Rate 75 BPM    Atrial Rate 75 BPM    P-R Interval 172 ms    QRS Duration 84 ms    Q-T Interval 372 ms    QTC Calculation (Bezet) 415 ms    Calculated P Axis 66 degrees    Calculated R Axis 63 degrees    Calculated T Axis 52 degrees    Diagnosis       Normal sinus rhythm  No previous ECGs available  Confirmed by Raelene Bott, MD, Kieryn Burtis [phone]) on 02/26/2016 2:19:03 PM     CBC WITH AUTOMATED DIFF    Collection Time: 02/26/16 12:56 PM   Result Value Ref Range    WBC 6.3 3.6 - 11.0 K/uL    RBC 4.29 3.80 - 5.20 M/uL    HGB 13.4 11.5 - 16.0 g/dL    HCT 41.6 35.0 - 47.0 %    MCV 97.0 80.0 - 99.0 FL    MCH 31.2 26.0 - 34.0 PG    MCHC 32.2 30.0 - 36.5 g/dL    RDW 13.7 11.5 - 14.5 %    PLATELET 209 150 - 400 K/uL    NEUTROPHILS 76 (H) 32 - 75 %    LYMPHOCYTES 12 12 - 49 %    MONOCYTES 11 5 - 13 %    EOSINOPHILS 1 0 - 7 %    BASOPHILS 0 0 - 1 %    ABS. NEUTROPHILS 4.7 1.8 - 8.0 K/UL    ABS. LYMPHOCYTES 0.8 0.8 - 3.5 K/UL    ABS. MONOCYTES 0.7 0.0 - 1.0 K/UL    ABS. EOSINOPHILS 0.1 0.0 - 0.4 K/UL    ABS. BASOPHILS 0.0 0.0 - 0.1 K/UL    DF SMEAR SCANNED      RBC COMMENTS NORMOCYTIC, NORMOCHROMIC     METABOLIC PANEL, COMPREHENSIVE    Collection Time: 02/26/16 12:56 PM   Result Value Ref Range    Sodium 138 136 - 145 mmol/L    Potassium 3.9 3.5 - 5.1 mmol/L    Chloride 104 97 - 108 mmol/L    CO2 27 21 - 32 mmol/L    Anion gap 7 5 - 15 mmol/L    Glucose 91  65 - 100 mg/dL    BUN 17 6 - 20 MG/DL    Creatinine 0.78 0.55 - 1.02 MG/DL    BUN/Creatinine ratio 22 (H) 12 - 20      GFR est AA >60 >60 ml/min/1.44m    GFR est non-AA >60 >60 ml/min/1.768m   Calcium 8.1 (L) 8.5 - 10.1 MG/DL    Bilirubin, total 0.3 0.2 - 1.0 MG/DL    ALT (SGPT) 23 12 - 78 U/L    AST (SGOT) 17 15 - 37 U/L    Alk. phosphatase 61 45 - 117 U/L    Protein, total 6.8 6.4 - 8.2 g/dL    Albumin 3.3 (L) 3.5 - 5.0 g/dL    Globulin 3.5 2.0 - 4.0 g/dL    A-G Ratio 0.9 (L) 1.1 - 2.2     TROPONIN I     Collection Time: 02/26/16 12:56 PM   Result Value Ref Range    Troponin-I, Qt. <0.04 <0.05 ng/mL   CK W/ REFLX CKMB    Collection Time: 02/26/16 12:56 PM   Result Value Ref Range    CK 72 26 - 192 U/L   URINALYSIS W/MICROSCOPIC    Collection Time: 02/26/16 12:56 PM   Result Value Ref Range    Color YELLOW/STRAW      Appearance CLOUDY (A) CLEAR      Specific gravity 1.024 1.003 - 1.030      pH (UA) 6.0 5.0 - 8.0      Protein 30 (A) NEG mg/dL    Glucose NEGATIVE  NEG mg/dL    Ketone NEGATIVE  NEG mg/dL    Bilirubin NEGATIVE  NEG      Blood TRACE (A) NEG      Urobilinogen 1.0 0.2 - 1.0 EU/dL    Nitrites POSITIVE (A) NEG      Leukocyte Esterase MODERATE (A) NEG      WBC 20-50 0 - 4 /hpf    RBC 0-5 0 - 5 /hpf    Epithelial cells FEW FEW /lpf    Bacteria 3+ (A) NEG /hpf   URINE CULTURE HOLD SAMPLE    Collection Time: 02/26/16 12:56 PM   Result Value Ref Range    Urine culture hold URINE ON HOLD IN MICROBIOLOGY DEPT FOR 3 DAYS         Ct Head Wo Cont    Result Date: 02/26/2016  EXAM:  CT HEAD WO CONT INDICATION:   Acute altered mental status COMPARISON: None. CONTRAST:  None. TECHNIQUE: Unenhanced CT of the head was performed using 5 mm images. Brain and bone windows were generated.  CT dose reduction was achieved through use of a standardized protocol tailored for this examination and automatic exposure control for dose modulation.  FINDINGS: The ventricles and sulci are normal in size, shape and configuration and midline. Right parietal shunt tip projects in the region of the left basal ganglia. Aneurysm clipping is noted. There is no significant white matter disease. There is no intracranial hemorrhage, extra-axial collection, mass, mass effect or midline shift.  The basilar cisterns are open. No acute infarct is identified. The bone windows demonstrate no abnormalities. The visualized portions of the paranasal sinuses and mastoid air cells are clear.     IMPRESSION: Shunt tubing as above. No acute abnormality.

## 2016-02-26 NOTE — ED Notes (Signed)
Patient verbalizes understanding of discharge instructions. Patient wheeled to waiting room in no acute distress at discharge.

## 2016-02-26 NOTE — ED Triage Notes (Signed)
Triage Note; Patient is coming in for altered mental status since Friday. Patient states "feeling sleepy"  Patient was brought to daughter's house by sibling and patient was normal. Patient denies any pain.

## 2016-02-26 NOTE — ED Notes (Signed)
Provided patient with a warm blanket. Denies any other needs or concerns at this time. Call bell within reach.

## 2016-02-26 NOTE — ED Notes (Signed)
Patient ambulatory to restroom. Steady gait noted. Urine specimen obtained.

## 2016-03-01 DIAGNOSIS — J069 Acute upper respiratory infection, unspecified: Secondary | ICD-10-CM | POA: Diagnosis not present

## 2016-03-01 DIAGNOSIS — L309 Dermatitis, unspecified: Secondary | ICD-10-CM | POA: Diagnosis not present

## 2016-03-01 DIAGNOSIS — F39 Unspecified mood [affective] disorder: Secondary | ICD-10-CM | POA: Diagnosis not present

## 2016-03-01 DIAGNOSIS — N3000 Acute cystitis without hematuria: Secondary | ICD-10-CM | POA: Diagnosis not present

## 2016-03-13 DIAGNOSIS — L309 Dermatitis, unspecified: Secondary | ICD-10-CM | POA: Diagnosis not present

## 2016-03-13 DIAGNOSIS — R350 Frequency of micturition: Secondary | ICD-10-CM | POA: Diagnosis not present

## 2016-03-13 DIAGNOSIS — E7800 Pure hypercholesterolemia, unspecified: Secondary | ICD-10-CM | POA: Diagnosis not present

## 2016-03-13 DIAGNOSIS — F39 Unspecified mood [affective] disorder: Secondary | ICD-10-CM | POA: Diagnosis not present

## 2016-05-02 ENCOUNTER — Non-Acute Institutional Stay: Payer: Medicare Other | Admitting: Internal Medicine

## 2016-05-02 ENCOUNTER — Encounter: Payer: Self-pay | Admitting: *Deleted

## 2016-05-02 VITALS — BP 118/60 | HR 81 | Temp 97.9°F | Ht 64.0 in | Wt 137.0 lb

## 2016-05-02 DIAGNOSIS — F1021 Alcohol dependence, in remission: Secondary | ICD-10-CM

## 2016-05-02 DIAGNOSIS — I729 Aneurysm of unspecified site: Secondary | ICD-10-CM

## 2016-05-02 DIAGNOSIS — R413 Other amnesia: Secondary | ICD-10-CM

## 2016-05-02 DIAGNOSIS — G910 Communicating hydrocephalus: Secondary | ICD-10-CM

## 2016-05-02 DIAGNOSIS — M810 Age-related osteoporosis without current pathological fracture: Secondary | ICD-10-CM | POA: Diagnosis not present

## 2016-05-02 DIAGNOSIS — F17201 Nicotine dependence, unspecified, in remission: Secondary | ICD-10-CM

## 2016-05-02 DIAGNOSIS — E785 Hyperlipidemia, unspecified: Secondary | ICD-10-CM

## 2016-05-02 DIAGNOSIS — R3915 Urgency of urination: Secondary | ICD-10-CM | POA: Diagnosis not present

## 2016-05-02 DIAGNOSIS — I609 Nontraumatic subarachnoid hemorrhage, unspecified: Secondary | ICD-10-CM | POA: Diagnosis not present

## 2016-05-02 NOTE — Progress Notes (Signed)
Provider:  Rexene Edison. Mariea Clonts, D.O., C.M.D. Location:  Occupational psychologist of Service:  Clinic (12)  Previous PCP: Osborne Casco, MD Patient Care Team: Kelton Pillar, MD as PCP - General (Family Medicine)  Extended Emergency Contact Information Primary Emergency Contact: Elder Negus [place] Mobile Phone: [phone] Relation: Daughter Secondary Emergency Contact: Cochrane,Arnold "Suezanne Jacquet"  [place] Mobile Phone: [phone] Relation: Grandson  Code Status: full code Goals of Care: Advanced Directive information Advanced Directives 05/02/2016  Does Patient Have a Medical Advance Directive? Yes  Type of Advance Directive [place]  Does patient want to make changes to medical advance directive? No - Patient declined  Copy of [place] in Chart? Yes  Would patient like information on creating a medical advance directive? -  already scanned in media/documents  Chief Complaint  Patient presents with  . Establish Care    new patient     HPI: Patient is a 82 y.o. female seen today to establish with me.  She has a h/o cerebral aneurysm with stroke (subarachnoid hemorrhage), subsequent memory loss, urinary urgency (says not incontinent and no one here to say one way or the other), osteoporosis on prolia, alcohol abuse (sober now), OA, depression and hyperlipidemia,.  She is transferring from Dr. Kelton Pillar.    She's only lived here in a garden home for 4-5 mos.  Likes it fine.  Gets to exercise each morning and eat well.  Appetite is good.    Her grandson was here with her, but he had left by the time I went in the exam room to see the patient.    She gets prolia twice a year throught Dr. Laurann Montana so we will arrange to follow through on that from this point forward.  I need her records for the last one she received.    She had a stroke due to an aneurysm.  Says memory is fair.   Reports she's 58.  Says she's had diarrhea from aricept for her memory.  Has trouble with diarrhea.  Probiotics helped her in the past.    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    No longer smokes.    Does get lonesome.  Dates a man in [place].  Has a friend she has dinner with here also.    Quit drinking 30 years.  Mother and grandmother were both alcoholics.  Notes in epic reveal some drinking in May of 2016 where there were concerns about a suicide attempt.  She does want to keep seeing Dr. Brett Fairy.    Has sore fingers and arthritis in her left knee from exercising.  Has urinary urgency, but no incontinence.  Also wears a pad due to the loose stools.    She is a poor historian and blows everything off when asked.    Past Medical History:  Diagnosis Date  . Arthritis    osteo  . Cystitis 03/02/2014   acute   . Dyslipidemia   . Hearing loss bil   got hearing aides.   Marland Kitchen UTI (urinary tract infection) 03/02/2014   Past Surgical History:  Procedure Laterality Date  . ABDOMINAL HYSTERECTOMY    . RADIOLOGY WITH ANESTHESIA N/A 03/04/2014   Procedure: RADIOLOGY WITH ANESTHESIA;  Surgeon: Rob Hickman, MD;  Location: [facility];  Service: Radiology;  Laterality: N/A;  . RADIOLOGY WITH ANESTHESIA N/A 03/04/2014   Procedure: RADIOLOGY WITH ANESTHESIA;  Surgeon: Medication Radiologist, MD;  Location: [facility];  Service: Radiology;  Laterality: N/A;  . VENTRICULOPERITONEAL SHUNT Right 03/15/2014   Procedure: SHUNT INSERTION VENTRICULAR-PERITONEAL;  Surgeon: Charlie Pitter, MD;  Location: [facility];  Service: Neurosurgery;  Laterality: Right;    Social History   Social History  . Marital status: Widowed    Spouse name: N/A  . Number of children: 4  . Years of education: hs grad   Social History Main Topics  . Smoking status: Former Smoker    Packs/day: 0.50    Types: Cigarettes    Quit date: 03/05/2014  . Smokeless tobacco: Never Used  . Alcohol use 0.0 oz/week      Comment: recovering alcoholic- started drinking 2 weeks ago (07-04-14), stated not drinking. 08-16-14  . Drug use: No  . Sexual activity: Not Asked   Other Topics Concern  . None   Social History Narrative   Caffeine 1 cup am avg.    reports that she quit smoking about 2 years ago. Her smoking use included Cigarettes. She smoked 0.50 packs per day. She has never used smokeless tobacco. She reports that she drinks alcohol. She reports that she does not use drugs.  Functional Status Survey:    Family History  Problem Relation Age of Onset  . Hypertension Mother   . Hypertension Father     Health Maintenance  Topic Date Due  . TETANUS/TDAP  09/07/1953  . PNA vac Low Risk Adult (2 of 2 - PPSV23) 01/16/2014  . INFLUENZA VACCINE  10/04/2015  . DEXA SCAN  Completed    No Known Allergies  Allergies as of 05/02/2016   No Known Allergies     Medication List       Accurate as of 05/02/16  3:03 PM. Always use your most recent med list.          calcium-vitamin D 500-200 MG-UNIT tablet Take 1 tablet by mouth daily.   cholecalciferol 1000 units tablet Commonly known as:  VITAMIN D Take 1,000 Units by mouth daily.   denosumab 60 MG/ML Soln injection Commonly known as:  PROLIA Inject 60 mg into the skin every 6 (six) months. Administer in upper arm, thigh, or abdomen   donepezil 10 MG tablet Commonly known as:  ARICEPT Take 10 mg by mouth daily.   glucosamine-chondroitin 500-400 MG tablet Take 1 tablet by mouth at bedtime.   RENOVA 0.02 % Crea Generic drug:  Tretinoin (Facial Wrinkles) Apply 1 application topically at bedtime.   sertraline 50 MG tablet Commonly known as:  ZOLOFT 1 TABLET ONCE A DAY ORALLY 30 DAY(S)   simvastatin 40 MG tablet Commonly known as:  ZOCOR Take 40 mg by mouth daily.   vitamin E 100 UNIT capsule Take 100 Units by mouth daily.       Review of Systems  Constitutional: Negative for chills, fever and malaise/fatigue.  HENT: Positive  for hearing loss. Negative for congestion.   Eyes: Negative for blurred vision.  Respiratory: Negative for cough and shortness of breath.   Cardiovascular: Negative for chest pain, palpitations and leg swelling.  Gastrointestinal: Negative for abdominal pain, blood in stool, constipation, diarrhea, heartburn, melena, nausea and vomiting.  Genitourinary: Positive for urgency. Negative for dysuria and frequency.  Musculoskeletal: Positive for joint pain. Negative for falls.  Skin: Negative for itching and rash.  Neurological: Negative for dizziness, loss of consciousness and weakness.  Endo/Heme/Allergies: Bruises/bleeds easily.  Psychiatric/Behavioral: Positive for depression, memory loss and substance abuse. The patient is not nervous/anxious and does not have  insomnia.        H/o alcohol abuse, tobacco abuse    Vitals:   05/02/16 1433  BP: 118/60  Pulse: 81  Temp: 97.9 F (36.6 C)  TempSrc: Oral  SpO2: 94%  Weight: 137 lb (62.1 kg)  Height: 5\' 4"  (1.626 m)   Body mass index is 23.52 kg/m. Physical Exam  Constitutional: She appears well-developed and well-nourished. No distress.  HENT:  Head: Normocephalic and atraumatic.  Right Ear: External ear normal.  Left Ear: External ear normal.  Nose: Nose normal.  Mouth/Throat: Oropharynx is clear and moist. No oropharyngeal exudate.  Eyes: Conjunctivae and EOM are normal. Pupils are equal, round, and reactive to light.  Neck: Normal range of motion. Neck supple. No JVD present.  Cardiovascular: Normal rate, regular rhythm, normal heart sounds and intact distal pulses.   Pulmonary/Chest: Effort normal and breath sounds normal. No respiratory distress.  Abdominal: Soft. Bowel sounds are normal. She exhibits no distension. There is no tenderness.  Musculoskeletal: Normal range of motion.  Lymphadenopathy:    She has no cervical adenopathy.  Neurological: She is alert. A sensory deficit is present.  Oriented to person, place, not  time--had to be called 5 times about new patient appt--daughter and grandson are contacts  Skin: Skin is warm and dry. Capillary refill takes less than 2 seconds.  Psychiatric:  Sherran Needs attitude about things; lacks insight into her cognitive impairment; disheveled appearance, in a hurry to get out of here    Labs reviewed: Basic Metabolic Panel: No results for input(s): NA, K, CL, CO2, GLUCOSE, BUN, CREATININE, CALCIUM, MG, PHOS in the last 8760 hours. Liver Function Tests: No results for input(s): AST, ALT, ALKPHOS, BILITOT, PROT, ALBUMIN in the last 8760 hours. No results for input(s): LIPASE, AMYLASE in the last 8760 hours. No results for input(s): AMMONIA in the last 8760 hours. CBC: No results for input(s): WBC, NEUTROABS, HGB, HCT, MCV, PLT in the last 8760 hours. Cardiac Enzymes: No results for input(s): CKTOTAL, CKMB, CKMBINDEX, TROPONINI in the last 8760 hours. BNP: Invalid input(s): POCBNP No results found for: HGBA1C Lab Results  Component Value Date   TSH 1.683 03/11/2014   Lab Results  Component Value Date   VITAMINB12 662 03/10/2014   Lab Results  Component Value Date   FOLATE 18.0 03/10/2014   Lab Results  Component Value Date   IRON 55 03/10/2014   TIBC 264 03/10/2014   FERRITIN 162 03/10/2014    Imaging and Procedures noted on new patient packet: Bone density, cscope but no dates given  Assessment/Plan 1. 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 SAH (subarachnoid hemorrhage) (HCC) -prior posterior communicating artery aneurysmal rupture led to this -has cognitive impairment/early dementia as a result of this,  may also have had some effects from her alcohol abuse of the past  3. Communicating hydrocephalus -also noted, does have wide-based unsteady gait, and did have some falls in 2017 noted  4. Urinary urgency -seems this is chronic, said to have had some UTIs causing delirium--unclear if she had true delirium or if this was the initial recognition of her dementing illness  5. Senile osteoporosis -is going to start getting  prolia through [place] -last bone density in epic 2003 but would have had since -we said we'd get her authorization completed -cont ca with D and additional D  6. Aneurysm ([place]) -cause of past stroke, cont to monitor  7. Hyperlipidemia, unspecified hyperlipidemia type -f/u labs -cont zocor  8. Alcoholism in remission ([place]) -noted, may also have affected memory  9. Tobacco abuse, in remission -noted for risk factors   Labs/tests ordered:  Cbc, cmp, flp before 3 mo f/u, need records for preventive care as no dates on new pt packet!  Odd b/c her old pcp called me asking for the transfer and I thought records got sent then but nothing recent in epic from her.  Parks Czajkowski L. Iver Fehrenbach, D.O. [facility] [address], [place] [address] Cell Phone (Mon-Fri 8am-5pm):  [phone] On Call:  [phone] & follow prompts after 5pm & weekends Office Phone:  [phone] Office Fax:  [phone]

## 2016-05-16 ENCOUNTER — Other Ambulatory Visit: Payer: Self-pay | Admitting: Internal Medicine

## 2016-05-16 DIAGNOSIS — R05 Cough: Secondary | ICD-10-CM

## 2016-05-16 DIAGNOSIS — R059 Cough, unspecified: Secondary | ICD-10-CM

## 2016-05-16 DIAGNOSIS — D72829 Elevated white blood cell count, unspecified: Secondary | ICD-10-CM

## 2016-05-16 DIAGNOSIS — R4182 Altered mental status, unspecified: Secondary | ICD-10-CM | POA: Diagnosis not present

## 2016-05-17 ENCOUNTER — Telehealth: Payer: Self-pay

## 2016-05-17 ENCOUNTER — Ambulatory Visit
Admission: RE | Admit: 2016-05-17 | Discharge: 2016-05-17 | Disposition: A | Discharge status: Home / Self Care | Payer: Medicare Other | Source: Ambulatory Visit | Attending: Internal Medicine | Admitting: Internal Medicine

## 2016-05-17 DIAGNOSIS — R05 Cough: Secondary | ICD-10-CM

## 2016-05-17 DIAGNOSIS — D72829 Elevated white blood cell count, unspecified: Secondary | ICD-10-CM

## 2016-05-17 DIAGNOSIS — R059 Cough, unspecified: Secondary | ICD-10-CM

## 2016-05-17 NOTE — Telephone Encounter (Signed)
Patient and her grand-daughter-n-law [name] called (call was on speaker phone). Patient would like the results of Xray completed this am.  Patient informed Dr.Reed is seeing patient's in office and we will call once report reviewed and addressed.

## 2016-05-17 NOTE — Telephone Encounter (Signed)
She can take over the counter cough syrup like robitussin.

## 2016-05-17 NOTE — Telephone Encounter (Signed)
Discussed Xray results with [name]. [name] would like to know why urine was not sent off in the first place when she asked for it to be collected.  [name] informed I will have [name] (Dr.Reed's assistant) further follow-up on if urine is still at [facility] in the [place] and why it was not sent off prior to today.

## 2016-05-17 NOTE — Telephone Encounter (Signed)
Spoke with grand-daughter and asked if pt is having any symptoms of UTI? Per GD she's " just not right" which I advised is not symptoms of UTI, per GD should they wait out the symptoms a few more days? Pt still coughing. What can they do about the cold?  ( urine was tossed yesterday by Maudie Mercury at [facility])

## 2016-05-17 NOTE — Telephone Encounter (Signed)
See report.  Since xray negative, can send off urine sample if still in [facility] fridge.

## 2016-05-17 NOTE — Telephone Encounter (Signed)
Spoke with Laqueta Carina states her grandmother is already taking robitussin and will continue to do so. [name] states she will be at next appointment to make sure her and Dr.Reed are on the same page. [name] was provided patient's next appointment date and time

## 2016-05-18 ENCOUNTER — Telehealth: Payer: Self-pay | Admitting: *Deleted

## 2016-05-18 DIAGNOSIS — N390 Urinary tract infection, site not specified: Secondary | ICD-10-CM | POA: Diagnosis not present

## 2016-05-18 DIAGNOSIS — J069 Acute upper respiratory infection, unspecified: Secondary | ICD-10-CM

## 2016-05-18 DIAGNOSIS — R509 Fever, unspecified: Secondary | ICD-10-CM | POA: Diagnosis not present

## 2016-05-18 DIAGNOSIS — R5381 Other malaise: Secondary | ICD-10-CM | POA: Diagnosis not present

## 2016-05-18 DIAGNOSIS — R319 Hematuria, unspecified: Secondary | ICD-10-CM | POA: Diagnosis not present

## 2016-05-18 MED ORDER — AZITHROMYCIN 250 MG PO TABS: ORAL_TABLET | ORAL | 0 refills | Status: DC

## 2016-05-18 NOTE — Telephone Encounter (Signed)
Just spoke with [facility] and advised rx has been sent to [facility].

## 2016-05-18 NOTE — Telephone Encounter (Signed)
Sharon [name], [facility] nurse covering for Mliss Sax and Sharon [name] called and stated that patient's daughter called her because patient was not answering her phone. Nurse and security went to patient's home and patent was not feeling well with URI. Stated that she had a chest X-ray on Tuesday that was negative. Vitals today was T:99.3, Pulse 95, RR 16, BP 108/68, SPO2: 93%. Lungs sound clear but has a lot of Congestion. Patient coughed up thick green nasty looking sputum. Nurse is wondering if you wanted to place patient on an antibiotic. Patient's kids are out of town. Please Advise.

## 2016-05-18 NOTE — Telephone Encounter (Signed)
I sent in a zpak to her pharmacy.  I've been addressing this since Tuesday when pt had her labs drawn.

## 2016-05-21 ENCOUNTER — Ambulatory Visit (INDEPENDENT_AMBULATORY_CARE_PROVIDER_SITE_OTHER): Payer: Medicare Other | Admitting: Adult Health

## 2016-05-21 ENCOUNTER — Encounter: Payer: Self-pay | Admitting: Adult Health

## 2016-05-21 VITALS — BP 130/77 | HR 80 | Ht 64.0 in | Wt 133.6 lb

## 2016-05-21 DIAGNOSIS — R413 Other amnesia: Secondary | ICD-10-CM

## 2016-05-21 NOTE — Progress Notes (Signed)
PATIENT: Sharon [name] DOB: [birth year]  REASON FOR VISIT: follow up- memory HISTORY FROM: patient  HISTORY OF PRESENT ILLNESS: Ms. Sharon [name] is a 82 year old female with a history of memory disturbance. She returns today for follow-up. She is currently not on any memory medication. She resides at [facility]. She is in independent living. She reports that she is able to complete all ADLs independently. She operates a Teacher, music without difficulty. Denies getting lost while driving. She continues to work at an [employer]. Her grandson helps her with her finances. Denies daytime sleepiness. Denies hallucinations. Denies tremor. Her daughter is with her today. She reports that approximately 3 weeks ago her mom started saying things that occurred in the past. For example she thought her daughter's room was in her house however her daughter has never lived there. She states the patient did have a common cold approximately a week ago. Chest x-ray was negative. She was given a Z-Pak. They also checked a urinalysis which was negative for UTI. She returns today for an evaluation.   HISTORY9/18/17 Mrs. Sharon [name] is here for a routine revisit from 05/17/2015. She has been on Nicotrol patches trying to quit smoking and hasn't had a cigarette in over a month. She reports no confusion she had an auto fall in generally in the backyard of her home and her grandson brought her to the emergency room were no abnormalities were found no infection or metabolic abnormality no x-ray injuries. She has not been hospitalized. She is driving. She is working and her own shop. She went to an antique fair in [place], [place].  She is independent in all activities of daily living reveals today a marker test and she scored 25 out of 30 points with 13  Points on word fluency test results.  This is to points better than her last test results from January. She is sleeping well her appetite is good, she is taking yoga  classes.  She is willing to move to [facility] and planning her transition. She has a close friend there and is looking forward to his company.  She appears perfectly well adjusted . I would not refrain her from driving. I will follow up q 6 month with MOCA and MMSE.    03-10-15 ,CM Ms. Sharon [name], 82 year old female returns for follow-up. She was last seen 08/16/14.  She has a history of memory loss and nontraumatic subarachnoid hemorrhage. Her  Aricept was reduced to 5 mg daily at that visit due to diarrhea. Since that time she called back in expected Dr. Brett Fairy on 10/25/2014 who told her to stop the Aricept altogether and began a Namenda starter pack which she picked up from her pharmacy and took the first month. According to her pharmacy today her refills have never been picked up. She reports that her memory is stable. She no longer has caregiver in the home. She is back to driving and has not had any problems. MOCA test today 23 out of 30. Last was 26 out of 30 .review She reports that her appetite is good and she is sleeping well. She is taking yoga .She returns for reevaluation   UPDATE 4-4-16CD The meeting today for a prolonged revisit appointment of 30 minutes. Mrs. Sharon [name] is here today with a caretaker, [name], and appears very alert and concentrated. She is reporting depression, but also hope, She has reported diarrhea, and suspects that either her bacterial setting is disturbed from ATB , or she may have contracted clostridium .  Be repeated today and MMSE that the patient had already performed the last time at 23 out of 30 points today she scored 27 out of 30 points. She recalled 2 day 2 of the 3 words that she missed last time was able to do the serial sevens completely she was not sure about the season and also the spring she stated it was fall. She was able to copy an object and clock draw a clock face correctly she named 9 animals in the animal fluency test and she wrote a perfect sentence  writing. Over 2 to make it a little bit more difficult she scored 25 out of 30 on the much more difficult Montral assessment test.I also have a transcranial Doppler study available here as was normal as flow direction and velocity in all identified vessels in expected sinus no evidence of stenosis basilar spasm or occlusion. The patient fell asleep doing the test and the technician noted that she was snoring. Her VP shunt performs well. She is much improved.  As she has no longer confusion, she keeps active in conversation and is interested in daily activities. I will ask her to come back in June and likely will give her permission d to drive if her recovery is sustained. I would like her to try the Ritalin that she was prescribed in [facility]. I will increase aricept to 10 mg now form 5 mg.   HISTORY:Mrs. Sharon [name] and had a very eventful end of the last year . He was suspected to have a UTI causing confusion, and was admitted on 02-28-14 , given multiple doses of ativan - Her agitation got worse. The attempt to do a lumbar puncture was unsuccessful. She needed sedation for an MRI, which did showed an aneurysm. Under the MRI sedation a LP was finally done, the CSF was cloudy. 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 She was coiled by Dr. Estanislado Pandy .  [name]- but a communicating hydrocephalus developed nonetheless, and a VP shunt was placed on Jan. 11th 2016 by Dr. Trenton Gammon. Since the aneurysm bleed occurred the patient also developed a urinary tract infection indeed and 100,000 colonies were seen on 04/11/2014 in her urinary sample that was obtained while she was in rehabilitation at [facility]. The main concern now is that this 82 year old right-handed Caucasian lady has still continued to have some altered mental status. She is not quite as concentrated and focused and she is  repetitive.   The patient used to live independently prior to December 2015. She was referred referred from hospital to come in place for rehabilitation per physical and occupational therapy were ongoing. She was placed on the more top to prevent muscle spasms. Decadron protocol was directed. In generally the patient still had some low-grade fevers that maintained on broad-spectrum antibiotics these stopped. She tends to confabulate during the exam and she has mild left-sided dysmetria and ataxia clearly related to the right-sided lesion. She is easily distracted. She was placed on Ritalin at bedtime to help improve anytime arousal focus attention and issues in regards to mental status. She is developed insomnia which is very common after an aneurysm bleed. She sometimes restless but she is doing much better than she has a visitor that she recognizes and trusts. She is currently on Pepcid, on Ritalin, Zocor at bedtime only. She was on a mechanical soft diet and thin liquids. Diet changed during her [facility] rehabilitation stay  to a regular diet. I was able to review the patient's recent lab results from 04-13-14 showed a normal CBC and differential her platelet count was 463,000. Her urinalysis confirmed 2 days after the beginning of Macrobid only few bacteria. Hepatic metabolic panel was entirely normal including creatinine and potassium and sodium. I did not see a TSH. However the TSH would unlikely be affected with and only 6 weeks after an aneurysm bleed. I will refer the patient also for transcranial Doppler study to make sure that there is no muscle spasm at the current time. She does not complain about headaches right now. Neither the patient nor her daughter confirm any nausea the last couple of days. She has no neck stiffness.    REVIEW OF SYSTEMS: Out of a complete 14 system review of symptoms, the patient complains only of the following symptoms, and all other reviewed systems are  negative.  Diarrhea, daytime sleepiness, depression, nervous/anxious, memory loss, activity change, fatigue  ALLERGIES: No Known Allergies  HOME MEDICATIONS: Outpatient Medications Prior to Visit  Medication Sig Dispense Refill  . azithromycin (ZITHROMAX Z-PAK) 250 MG tablet Two tabs today, then one tab daily x 4 days 6 each 0  . Calcium Carb-Cholecalciferol (CALCIUM-VITAMIN D) 500-200 MG-UNIT tablet Take 1 tablet by mouth daily.    . cholecalciferol (VITAMIN D) 1000 UNITS tablet Take 1,000 Units by mouth daily.    Marland Kitchen denosumab (PROLIA) 60 MG/ML SOLN injection Inject 60 mg into the skin every 6 (six) months. Administer in upper arm, thigh, or abdomen    . RENOVA 0.02 % CREA Apply 1 application topically at bedtime.   3  . sertraline (ZOLOFT) 50 MG tablet 1 TABLET ONCE A DAY ORALLY 30 DAY(S)  12  . simvastatin (ZOCOR) 40 MG tablet Take 40 mg by mouth daily.   3  . glucosamine-chondroitin 500-400 MG tablet Take 1 tablet by mouth at bedtime.     . vitamin E 100 UNIT capsule Take 100 Units by mouth daily.     No facility-administered medications prior to visit.     PAST MEDICAL HISTORY: Past Medical History:  Diagnosis Date  . Arthritis    osteo  . Cystitis 03/02/2014   acute   . Dyslipidemia   . Hearing loss bil   got hearing aides.   Marland Kitchen UTI (urinary tract infection) 03/02/2014    PAST SURGICAL HISTORY: Past Surgical History:  Procedure Laterality Date  . ABDOMINAL HYSTERECTOMY    . RADIOLOGY WITH ANESTHESIA N/A 03/04/2014   Procedure: RADIOLOGY WITH ANESTHESIA;  Surgeon: Rob Hickman, MD;  Location: [facility];  Service: Radiology;  Laterality: N/A;  . RADIOLOGY WITH ANESTHESIA N/A 03/04/2014   Procedure: RADIOLOGY WITH ANESTHESIA;  Surgeon: Medication Radiologist, MD;  Location: [facility];  Service: Radiology;  Laterality: N/A;  . VENTRICULOPERITONEAL SHUNT Right 03/15/2014   Procedure: SHUNT INSERTION VENTRICULAR-PERITONEAL;  Surgeon: Charlie Pitter, MD;  Location: [facility];   Service: Neurosurgery;  Laterality: Right;    FAMILY HISTORY: Family History  Problem Relation Age of Onset  . Hypertension Mother   . Hypertension Father     SOCIAL HISTORY: Social History   Social History  . Marital status: Widowed    Spouse name: N/A  . Number of children: 4  . Years of education: hs grad   Occupational History  . Not on file.   Social History Main Topics  . Smoking status: Former Smoker    Packs/day: 0.50    Types: Cigarettes  Quit date: 03/05/2014  . Smokeless tobacco: Never Used  . Alcohol use 0.0 oz/week     Comment: recovering alcoholic- started drinking 2 weeks ago (07-04-14), stated not drinking. 08-16-14  . Drug use: No  . Sexual activity: Not on file   Other Topics Concern  . Not on file   Social History Narrative   Caffeine 1 cup am avg.  Lives at [facility].        PHYSICAL EXAM  Vitals:   05/21/16 0937  BP: 130/77  Pulse: 80  Weight: 133 lb 9.6 oz (60.6 kg)  Height: 5\' 4"  (1.626 m)   Body mass index is 22.93 kg/m. Montreal Cognitive Assessment  05/21/2016 11/21/2015 05/17/2015 02/15/2015  Visuospatial/ Executive (0/5) 5 5 5 4   Naming (0/3) 3 3 3 3   Attention: Read list of digits (0/2) 2 1 1 1   Attention: Read list of letters (0/1) 1 1 1 1   Attention: Serial 7 subtraction starting at 100 (0/3) 2 1 2 3   Language: Repeat phrase (0/2) 2 2 2 2   Language : Fluency (0/1) 0 1 1 0  Abstraction (0/2) 1 2 2 1   Delayed Recall (0/5) 3 2 3 2   Orientation (0/6) 6 6 5 6   Total 25 24 25 23   Adjusted Score (based on education) - 24 25 23      Generalized: Well developed, in no acute distress   Neurological examination  Mentation: Alert oriented to time, place, history taking. Follows all commands speech and language fluent Cranial nerve II-XII: Pupils were equal round reactive to light. Extraocular movements were full, visual field were full on confrontational test. Facial sensation and strength were normal. Uvula tongue midline. Head  turning and shoulder shrug  were normal and symmetric. Motor: The motor testing reveals 5 over 5 strength of all 4 extremities. Good symmetric motor tone is noted throughout.  Sensory: Sensory testing is intact to soft touch on all 4 extremities. No evidence of extinction is noted.  Coordination: Cerebellar testing reveals good finger-nose-finger and heel-to-shin bilaterally.  Gait and station: Gait is normal. Tandem gait is normal. Romberg is negative. No drift is seen.  Reflexes: Deep tendon reflexes are symmetric and normal bilaterally.   DIAGNOSTIC DATA (LABS, IMAGING, TESTING) - I reviewed patient records, labs, notes, testing and imaging myself where available.  Lab Results  Component Value Date   WBC 7.4 03/24/2015   HGB 15.4 (H) 03/24/2015   HCT 46.7 (H) 03/24/2015   MCV 98.1 03/24/2015   PLT 253 03/24/2015      Component Value Date/Time   NA 137 03/24/2015 1009   K 4.2 03/24/2015 1009   CL 98 (L) 03/24/2015 1009   CO2 28 03/24/2015 1009   GLUCOSE 140 (H) 03/24/2015 1009   BUN 12 03/24/2015 1009   CREATININE 0.87 03/24/2015 1009   CALCIUM 9.6 03/24/2015 1009   PROT 6.8 03/24/2015 1009   ALBUMIN 3.9 03/24/2015 1009   AST 24 03/24/2015 1009   ALT 19 03/24/2015 1009   ALKPHOS 56 03/24/2015 1009   BILITOT 0.6 03/24/2015 1009   GFRNONAA >60 03/24/2015 1009   GFRAA >60 03/24/2015 1009   Lab Results  Component Value Date   CHOL 150 03/10/2014   HDL 61 03/10/2014   LDLCALC 74 03/10/2014   TRIG 77 03/10/2014   CHOLHDL 2.5 03/10/2014    Lab Results  Component Value Date   VITAMINB12 662 03/10/2014   Lab Results  Component Value Date   TSH 1.683 03/11/2014  ASSESSMENT AND PLAN 82 y.o. year old female  has a past medical history of Arthritis; Cystitis (03/02/2014); Dyslipidemia; Hearing loss (bil); and UTI (urinary tract infection) (03/02/2014). here with:  1. Memory disturbance  The patient's memory score has remained stable. In the past the patient has  been given a Namenda prescription however she never picked this up. She was also placed on Aricept however she had diarrhea. At this time we will not start any new medication. We will continue to monitor her memory. If the patient's memory score declines we will consider reordering Namenda. The patient and her daughter are amenable to this plan. She will follow-up in 6 months or sooner if needed.  I spent 15 minutes with the patient 50% of this time was spent answering questions regarding her symptoms and medication.    Ward Givens, MSN, NP-C 05/21/2016, 10:42 AM [facility] [address], [address], [place] [address] [phone]

## 2016-05-21 NOTE — Progress Notes (Signed)
I agree with the assessment and plan as directed by NP .The patient is known to me .   Brannan Cassedy, MD  

## 2016-05-21 NOTE — Patient Instructions (Signed)
We will continue to monitor memory Memory score is stable.

## 2016-06-14 ENCOUNTER — Other Ambulatory Visit: Payer: Self-pay | Admitting: Family Medicine

## 2016-06-14 DIAGNOSIS — Z1231 Encounter for screening mammogram for malignant neoplasm of breast: Secondary | ICD-10-CM

## 2016-07-09 ENCOUNTER — Ambulatory Visit: Payer: Medicare Other

## 2016-07-16 ENCOUNTER — Ambulatory Visit: Payer: Medicare Other

## 2016-07-17 DIAGNOSIS — L570 Actinic keratosis: Secondary | ICD-10-CM | POA: Diagnosis not present

## 2016-07-17 DIAGNOSIS — D1801 Hemangioma of skin and subcutaneous tissue: Secondary | ICD-10-CM | POA: Diagnosis not present

## 2016-07-17 DIAGNOSIS — L814 Other melanin hyperpigmentation: Secondary | ICD-10-CM | POA: Diagnosis not present

## 2016-07-24 ENCOUNTER — Ambulatory Visit
Admission: RE | Admit: 2016-07-24 | Discharge: 2016-07-24 | Disposition: A | Discharge status: Home / Self Care | Payer: Medicare Other | Source: Ambulatory Visit | Attending: Family Medicine | Admitting: Family Medicine

## 2016-07-24 DIAGNOSIS — Z1231 Encounter for screening mammogram for malignant neoplasm of breast: Secondary | ICD-10-CM

## 2016-07-24 LAB — HM MAMMOGRAPHY

## 2016-07-26 ENCOUNTER — Ambulatory Visit: Payer: Medicare Other

## 2016-07-27 DIAGNOSIS — G3184 Mild cognitive impairment, so stated: Secondary | ICD-10-CM | POA: Diagnosis not present

## 2016-07-31 DIAGNOSIS — M810 Age-related osteoporosis without current pathological fracture: Secondary | ICD-10-CM | POA: Insufficient documentation

## 2016-07-31 DIAGNOSIS — R3915 Urgency of urination: Secondary | ICD-10-CM | POA: Insufficient documentation

## 2016-07-31 DIAGNOSIS — F17201 Nicotine dependence, unspecified, in remission: Secondary | ICD-10-CM | POA: Insufficient documentation

## 2016-07-31 DIAGNOSIS — F1021 Alcohol dependence, in remission: Secondary | ICD-10-CM | POA: Insufficient documentation

## 2016-08-01 ENCOUNTER — Encounter: Payer: Medicare Other | Admitting: Internal Medicine

## 2016-08-09 DIAGNOSIS — G3184 Mild cognitive impairment, so stated: Secondary | ICD-10-CM | POA: Diagnosis not present

## 2016-08-10 DIAGNOSIS — G3184 Mild cognitive impairment, so stated: Secondary | ICD-10-CM | POA: Diagnosis not present

## 2016-08-15 DIAGNOSIS — M25561 Pain in right knee: Secondary | ICD-10-CM | POA: Diagnosis not present

## 2016-08-23 DIAGNOSIS — M810 Age-related osteoporosis without current pathological fracture: Secondary | ICD-10-CM | POA: Diagnosis not present

## 2016-11-06 IMAGING — CT CT HEAD W/O CM
1 of 2 series · 15 of 30 positions shown, 19 images · non-contrast
Comparison: 03/16/2014

CLINICAL DATA: Lethargy

EXAM:
CT HEAD WITHOUT CONTRAST
TECHNIQUE: Contiguous axial images were obtained from the base of the skull
through the vertex without intravenous contrast.

[Series 3: head 2.0 h70h · axial · 0.42mm/px · z∈[-130,+8]mm · 15 of 77 slices shown, 19 images]
[im 4/77  brain]
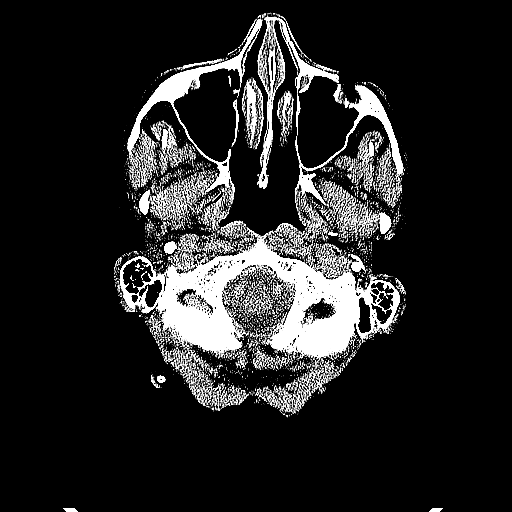
[im 4/77  bone]
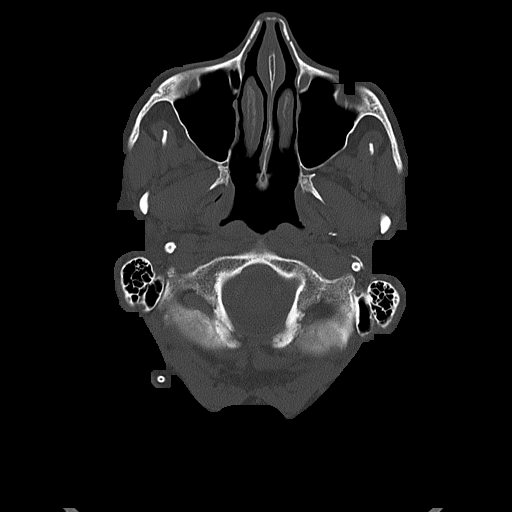
[im 8/77  brain]
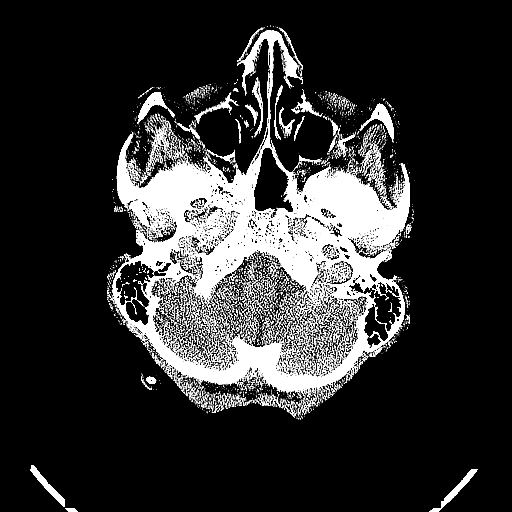
[im 16/77  brain]
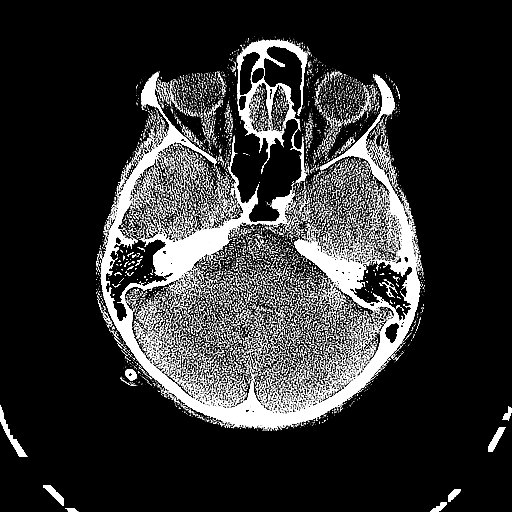
[im 20/77  brain]
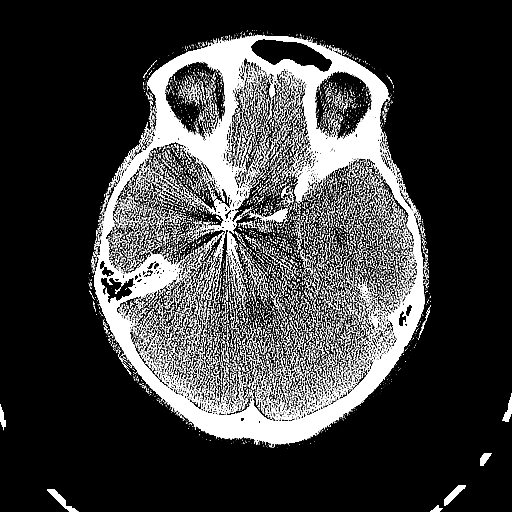
[im 23/77  brain]
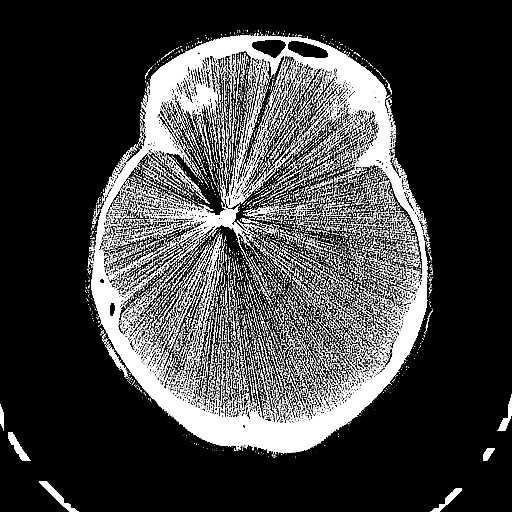
[im 23/77  bone]
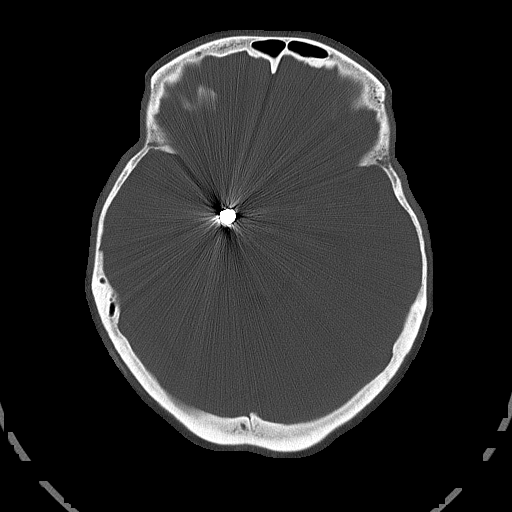
[im 27/77  brain]
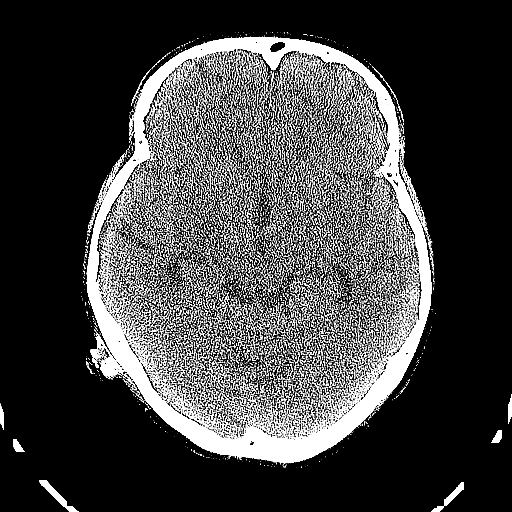
[im 35/77  brain]
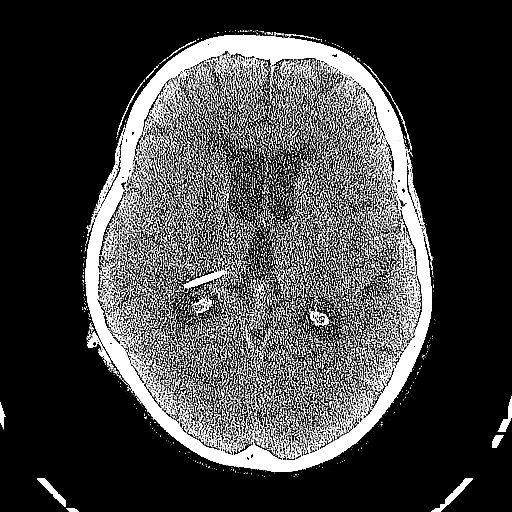
[im 39/77  brain]
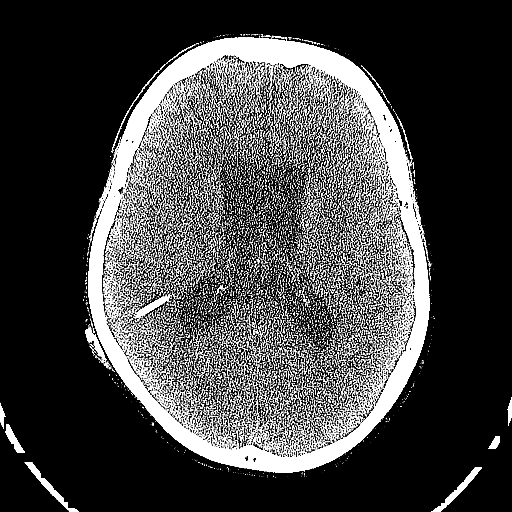
[im 42/77  brain]
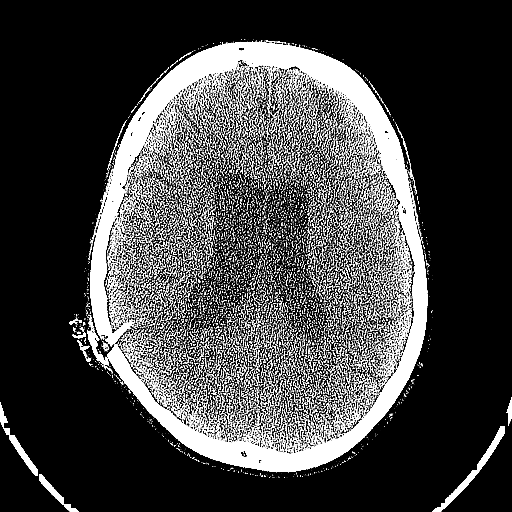
[im 42/77  bone]
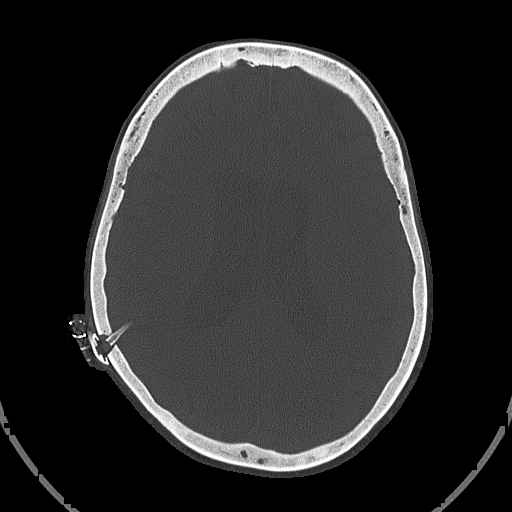
[im 50/77  brain]
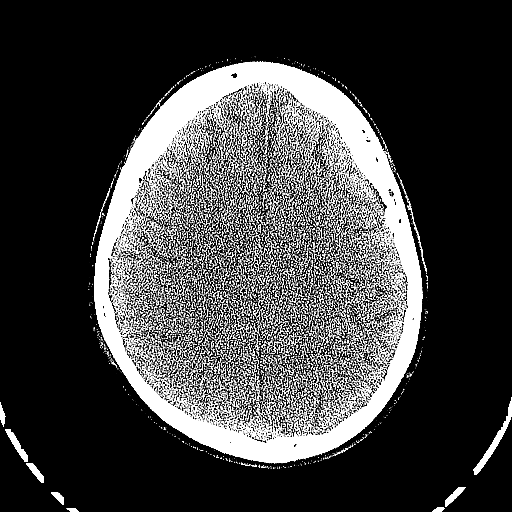
[im 54/77  brain]
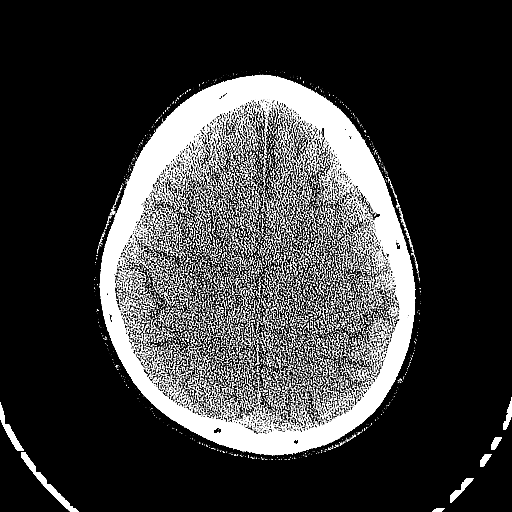
[im 58/77  brain]
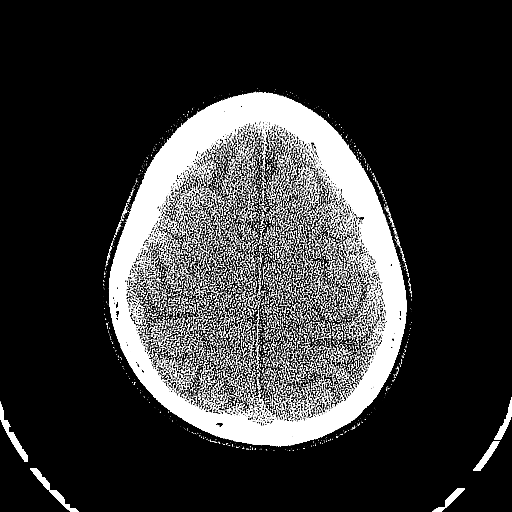
[im 61/77  brain]
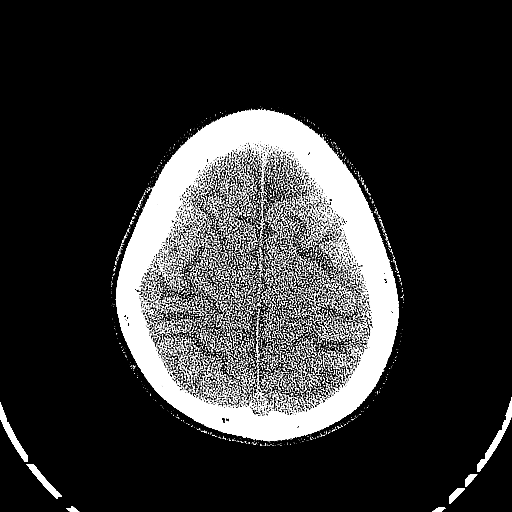
[im 61/77  bone]
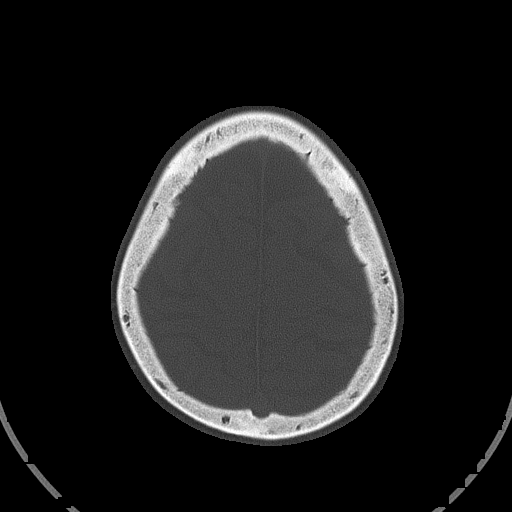
[im 69/77  brain]
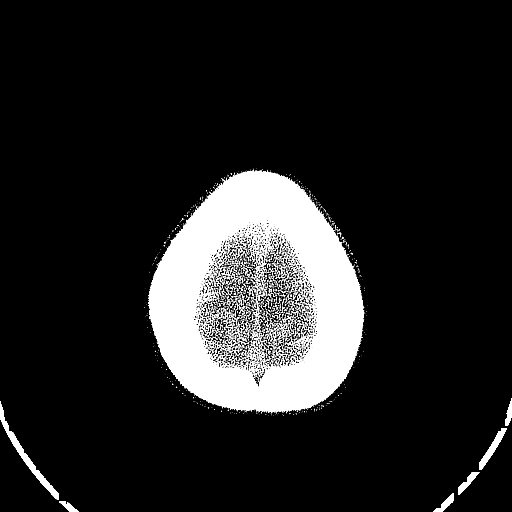
[im 73/77  brain]
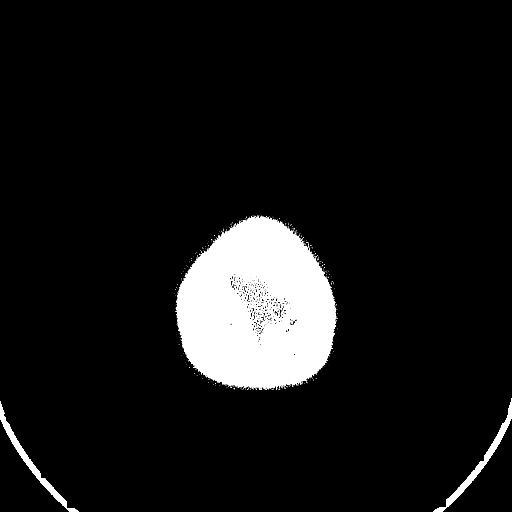

[15 of 30 positions shown; findings below may reference images not displayed]

FINDINGS: Prior coiling of RIGHT posterior communicating artery region
aneurysm with associated beam hardening artifacts.

Intraventricular catheter via RIGHT parietal approach again
identified tip traversing the third ventricle into the LEFT splenic
region.

Normal ventricular morphology.

No midline shift or mass effect.

Small vessel chronic ischemic changes of deep cerebral white matter.

No intracranial hemorrhage, mass lesion, or evidence acute
infarction.

No extra-axial fluid collections.

Bones and sinuses unremarkable.
IMPRESSION: Post procedural changes from aneurysm coiling and ventricular
shunting again seen, stable.

Small vessel chronic ischemic changes of deep cerebral white matter.

No acute intracranial abnormalities.

## 2016-11-12 ENCOUNTER — Ambulatory Visit: Payer: Medicare Other | Admitting: Neurology

## 2016-11-14 ENCOUNTER — Ambulatory Visit (INDEPENDENT_AMBULATORY_CARE_PROVIDER_SITE_OTHER): Payer: Medicare Other | Admitting: Neurology

## 2016-11-14 ENCOUNTER — Encounter: Payer: Self-pay | Admitting: Neurology

## 2016-11-14 VITALS — BP 118/75 | HR 81 | Ht 65.0 in | Wt 142.0 lb

## 2016-11-14 DIAGNOSIS — G3184 Mild cognitive impairment, so stated: Secondary | ICD-10-CM

## 2016-11-14 NOTE — Progress Notes (Signed)
[facility]  PATIENT: Sharon [name] DOB: Sep 12, 1934   REASON FOR VISIT: Follow-up for nontraumatic subarachnoid hemorrhage, memory loss  HISTORY FROM: Patient    I had the pleasure of meeting Sharon [name] and her daughter-in-law today on 11/14/2016, the patient is here for routine revisit with memory testing she performed today well on a Montral cognitive assessment with a final count of 25 points out of 30 possible points. Geriatric depression scale was endorsed at 2 out of 15 points. 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  HISTORY OF PRESENT ILLNESS:   Sharon [name] is here for a routine revisit from 05/17/2015. She has been on Nicotrol patches trying to quit smoking and hasn't had a cigarette in over a month. She reports no confusion she had an auto fall in generally in the backyard of her home and her grandson brought her to the emergency room were no abnormalities were found no infection or metabolic abnormality no x-ray injuries. She has not been hospitalized. She is driving. She is working and her own shop. She went to an antique fair in [place], [place].  She is independent in all activities of daily living reveals today a marker test and she scored 25 out of 30 points with 13  Points on word fluency test results.  This is to points better than her last test results from January. She is sleeping well her appetite is good, she is taking yoga classes.  She is willing to move to [facility] and planning her transition. She has a close friend there and is looking forward to his company.  She appears perfectly well adjusted . I would not refrain her from driving. I will follow up q 6 month with MOCA and MMSE.    03-10-15 ,CM Sharon [name], 82 year old female returns for follow-up. She was last seen 08/16/14.  She has a history of memory loss and nontraumatic subarachnoid hemorrhage. Her  Aricept was reduced to 5 mg daily at that visit  due to diarrhea. Since that time she called back in expected Dr. Brett Fairy on 10/25/2014 who told her to stop the Aricept altogether and began a Namenda starter pack which she picked up from her pharmacy and took the first month. According to her pharmacy today her refills have never been picked up. She reports that her memory is stable. She no longer has caregiver in the home. She is back to driving and has not had any problems. MOCA test today 23 out of 30. Last was 26 out of 30 .review She reports that her appetite is good and she is sleeping well. She is taking yoga .She returns for reevaluation   UPDATE 4-4-16CD The meeting today for a prolonged revisit appointment of 30 minutes. Sharon [name] is here today with a caretaker, Sharon [name], and appears very alert and concentrated. She is reporting depression, but also hope, She has reported diarrhea, and suspects that either her bacterial setting is disturbed from ATB , or she may have contracted clostridium . Be repeated today and MMSE that the patient had already performed the last time at 23 out of 30 points today she scored 27 out of 30 points. She recalled 2 day 2 of the 3 words that she missed last time was able to do the serial sevens completely she was not sure about the season and also the spring she stated it was fall. She was able to copy an object and clock  draw a clock face correctly she named 9 animals in the animal fluency test and she wrote a perfect sentence writing. Over 2 to make it a little bit more difficult she scored 25 out of 30 on the much more difficult Montral assessment test.I also have a transcranial Doppler study available here as was normal as flow direction and velocity in all identified vessels in expected sinus no evidence of stenosis basilar spasm or occlusion. The patient fell asleep doing the test and the technician noted that she was snoring. Her VP shunt performs well. She is much improved.  As she has no longer confusion,  she keeps active in conversation and is interested in daily activities. I will ask her to come back in June and likely will give her permission d to drive if her recovery is sustained. I would like her to try the Ritalin that she was prescribed in [facility] place. I will increase aricept to 10 mg now form 5 mg.   HISTORY:Sharon [name] and had a very eventful end of the last year . He was suspected to have a UTI causing confusion, and was admitted on 02-28-14 , given multiple doses of ativan - Her agitation got worse. The attempt to do a lumbar puncture was unsuccessful. She needed sedation for an MRI, which did showed an aneurysm. Under the MRI sedation a LP was finally done, the CSF was cloudy. 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 She was coiled by Dr. Estanislado Pandy .  [name]- but a communicating hydrocephalus developed nonetheless, and a VP shunt was placed on Jan. 11th 2016 by Dr. Trenton Gammon. Since the aneurysm bleed occurred the patient also developed a urinary tract infection indeed and 100,000 colonies were seen on 04/11/2014 in her urinary sample that was obtained while she was in rehabilitation at [facility]. The main concern now is that this 82 year old right-handed Caucasian lady has still continued to have some altered mental status. She is not quite as concentrated and focused and she is repetitive.   The patient used to live independently prior to December 2015. She was referred referred from hospital to come in place for rehabilitation per physical and occupational therapy were ongoing. She was placed on the more top to prevent muscle spasms. Decadron protocol was directed. In generally the patient still had some low-grade fevers that maintained on broad-spectrum antibiotics these stopped. She tends to confabulate during the exam and she has mild left-sided dysmetria and ataxia  clearly related to the right-sided lesion. She is easily distracted. She was placed on Ritalin at bedtime to help improve anytime arousal focus attention and issues in regards to mental status. She is developed insomnia which is very common after an aneurysm bleed. She sometimes restless but she is doing much better than she has a visitor that she recognizes and trusts. She is currently on Pepcid, on Ritalin, Zocor at bedtime only. She was on a mechanical soft diet and thin liquids. Diet changed during her [facility] rehabilitation stay to a regular diet. I was able to review the patient's recent lab results from 04-13-14 showed a normal CBC and differential her platelet count was 463,000. Her urinalysis confirmed 2 days after the beginning of Macrobid only few bacteria. Hepatic metabolic panel was entirely normal including creatinine and potassium and sodium. I did not see a TSH. However the TSH would unlikely be affected with and only 6 weeks after an aneurysm bleed. I  will refer the patient also for transcranial Doppler study to make sure that there is no muscle spasm at the current time. She does not complain about headaches right now. Neither the patient nor her daughter confirm any nausea the last couple of days. She has no neck stiffness.    REVIEW OF SYSTEMS: Full 14 system review of systems performed and notable only for those listed, all others are neg:  Sharon [name] reports no recent confusional episodes, she does not appear depressed and her geriatric depression score is endorsed at only 2 points, she scored fine in her Montral cognitive assessment test at 25 out of 30 points. She missed to recall words areas she was fine in trail making, could draw a cube and a clock face visual spatial and orientation were all intact. I would like for her to leave a stool sample today take your take the tube with her to rule out that her diarrhea is related to an infection and perhaps not to medication at  all.  ALLERGIES: No Known Allergies  HOME MEDICATIONS: Outpatient Medications Prior to Visit  Medication Sig Dispense Refill  . cholecalciferol (VITAMIN D) 1000 UNITS tablet Take 1,000 Units by mouth daily.    Marland Kitchen denosumab (PROLIA) 60 MG/ML SOLN injection Inject 60 mg into the skin every 6 (six) months. Administer in upper arm, thigh, or abdomen    . RENOVA 0.02 % CREA Apply 1 application topically at bedtime.   3  . sertraline (ZOLOFT) 50 MG tablet 1 TABLET ONCE A DAY ORALLY 30 DAY(S)  12  . simvastatin (ZOCOR) 40 MG tablet Take 40 mg by mouth daily.   3  . azithromycin (ZITHROMAX Z-PAK) 250 MG tablet Two tabs today, then one tab daily x 4 days 6 each 0  . Calcium Carb-Cholecalciferol (CALCIUM-VITAMIN D) 500-200 MG-UNIT tablet Take 1 tablet by mouth daily.     No facility-administered medications prior to visit.     PAST MEDICAL HISTORY: Past Medical History:  Diagnosis Date  . Arthritis    osteo  . Cystitis 03/02/2014   acute   . Dyslipidemia   . Hearing loss bil   got hearing aides.   Marland Kitchen UTI (urinary tract infection) 03/02/2014    PAST SURGICAL HISTORY: Past Surgical History:  Procedure Laterality Date  . ABDOMINAL HYSTERECTOMY    . RADIOLOGY WITH ANESTHESIA N/A 03/04/2014   Procedure: RADIOLOGY WITH ANESTHESIA;  Surgeon: Rob Hickman, MD;  Location: [facility];  Service: Radiology;  Laterality: N/A;  . RADIOLOGY WITH ANESTHESIA N/A 03/04/2014   Procedure: RADIOLOGY WITH ANESTHESIA;  Surgeon: Medication Radiologist, MD;  Location: [facility];  Service: Radiology;  Laterality: N/A;  . VENTRICULOPERITONEAL SHUNT Right 03/15/2014   Procedure: SHUNT INSERTION VENTRICULAR-PERITONEAL;  Surgeon: Charlie Pitter, MD;  Location: [facility];  Service: Neurosurgery;  Laterality: Right;    FAMILY HISTORY: Family History  Problem Relation Age of Onset  . Hypertension Mother   . Hypertension Father     SOCIAL HISTORY: Social History   Social History  . Marital status: Widowed     Spouse name: N/A  . Number of children: 4  . Years of education: hs grad   Occupational History  . Not on file.   Social History Main Topics  . Smoking status: Former Smoker    Packs/day: 0.50    Types: Cigarettes    Quit date: 03/05/2014  . Smokeless tobacco: Never Used  . Alcohol use 0.0 oz/week     Comment: recovering alcoholic-  started drinking 2 weeks ago (07-04-14), stated not drinking. 08-16-14  . Drug use: No  . Sexual activity: Not on file   Other Topics Concern  . Not on file   Social History Narrative   Caffeine 1 cup am avg.  Lives at [place] [facility].       PHYSICAL EXAM  Vitals:   11/14/16 1533  BP: 118/75  Pulse: 81  Weight: 142 lb (64.4 kg)  Height: 5\' 5"  (1.651 m)   Body mass index is 23.63 kg/m.   Generalized: Well developed, well groomed in no acute distress  Head: normocephalic and atraumatic. Oropharynx benign  Neck: Supple, no carotid bruits  Cardiac: Regular rate rhythm, no murmur  Musculoskeletal: No deformity   Neurological examination;  Mentation: Alert oriented to time, place, history taking. Attention span and concentration appropriate.   Montreal Cognitive Assessment  11/14/2016 05/21/2016 11/21/2015 05/17/2015 02/15/2015  Visuospatial/ Executive (0/5) 4 5 5 5 4   Naming (0/3) 3 3 3 3 3   Attention: Read list of digits (0/2) 2 2 1 1 1   Attention: Read list of letters (0/1) 1 1 1 1 1   Attention: Serial 7 subtraction starting at 100 (0/3) 2 2 1 2 3   Language: Repeat phrase (0/2) 2 2 2 2 2   Language : Fluency (0/1) 1 0 1 1 0  Abstraction (0/2) 1 1 2 2 1   Delayed Recall (0/5) 3 3 2 3 2   Orientation (0/6) 6 6 6 5 6   Total 25 25 24 25 23   Adjusted Score (based on education) - - 24 25 23      Cranial nerve :  Pupils were equal round reactive to light extraocular movements were full, visual field were full on confrontational test. Facial sensation and strength were normal. hearing was intact to finger rubbing bilaterally. Uvula tongue midline.  head turning and shoulder shrug were normal and symmetric.Tongue protrusion into cheek strength was normal. Motor: normal bulk and tone, full strength in the BUE, BLE, fine finger movements normal, no pronator drift. No focal weakness Sensory: normal and symmetric to light touch, pinprick, and Vibration,  Coordination: finger-nose-finger, heel-to-shin bilaterally, no dysmetria Reflexes: Brachioradialis 2/2, biceps 2/2, triceps 2/2, patellar 2/2, Achilles 2/2, plantar responses were flexor bilaterally. Gait and Station: Rising up from seated position without assistance, normal stance, moderate stride, good arm swing, smooth turning, able to perform tiptoe, and heel walking without difficulty. Tandem gait is mildly unsteady. No assistive device  DIAGNOSTIC DATA (LABS, IMAGING, TESTING) - I reviewed patient records, labs, notes, testing and imaging myself where available.  Lab Results  Component Value Date   WBC 7.4 03/24/2015   HGB 15.4 (H) 03/24/2015   HCT 46.7 (H) 03/24/2015   MCV 98.1 03/24/2015   PLT 253 03/24/2015      Component Value Date/Time   NA 137 03/24/2015 1009   K 4.2 03/24/2015 1009   CL 98 (L) 03/24/2015 1009   CO2 28 03/24/2015 1009   GLUCOSE 140 (H) 03/24/2015 1009   BUN 12 03/24/2015 1009   CREATININE 0.87 03/24/2015 1009   CALCIUM 9.6 03/24/2015 1009   PROT 6.8 03/24/2015 1009   ALBUMIN 3.9 03/24/2015 1009   AST 24 03/24/2015 1009   ALT 19 03/24/2015 1009   ALKPHOS 56 03/24/2015 1009   BILITOT 0.6 03/24/2015 1009   GFRNONAA >60 03/24/2015 1009   GFRAA >60 03/24/2015 1009   Lab Results  Component Value Date   CHOL 150 03/10/2014   HDL 61 03/10/2014   LDLCALC 74 03/10/2014  TRIG 77 03/10/2014   CHOLHDL 2.5 03/10/2014    Lab Results  Component Value Date   [id number] 767 03/10/2014   Lab Results  Component Value Date   TSH 1.683 03/11/2014      ASSESSMENT AND PLAN 82 y.o. year old female has a past medical history of cognitive disturbance  from subarachnoid bleed December 2015.  She has been seen yearly at  [facility] in [place], [place]. They prescribed Namenda. [phone].   She is now back to driving. 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  She is having diarrhea on Aricept and that was discontinued by Dr. Brett Fairy 10/25/14 . She has moved to [employer]. She has dinner at [facility] nightly, feels in good company.  She was placed on a Namenda starter pack and never picked up the refill medication.  She eats probiotic foods. She is taking a statin. She does appear healthy and happy.     Larey Seat, MD  [facility] [address], [place], [place] [address] [phone]

## 2016-11-14 NOTE — Patient Instructions (Signed)
Memantine Tablets  What is this medicine?  MEMANTINE (MEM an teen) is used to treat dementia caused by Alzheimer's disease.  This medicine may be used for other purposes; ask your health care provider or pharmacist if you have questions.  COMMON BRAND NAME(S): Namenda  What should I tell my health care provider before I take this medicine?  They need to know if you have any of these conditions:  -difficulty passing urine  -kidney disease  -liver disease  -seizures  -an unusual or allergic reaction to memantine, other medicines, foods, dyes, or preservatives  -pregnant or trying to get pregnant  -breast-feeding  How should I use this medicine?  Take this medicine by mouth with a glass of water. Follow the directions on the prescription label. You may take this medicine with or without food. Take your doses at regular intervals. Do not take your medicine more often than directed. Continue to take your medicine even if you feel better. Do not stop taking except on the advice of your doctor or health care professional.  Talk to your pediatrician regarding the use of this medicine in children. Special care may be needed.  Overdosage: If you think you have taken too much of this medicine contact a poison control center or emergency room at once.  NOTE: This medicine is only for you. Do not share this medicine with others.  What if I miss a dose?  If you miss a dose, take it as soon as you can. If it is almost time for your next dose, take only that dose. Do not take double or extra doses. If you do not take your medicine for several days, contact your health care provider. Your dose may need to be changed.  What may interact with this medicine?  -acetazolamide  -amantadine  -cimetidine  -dextromethorphan  -dofetilide  -hydrochlorothiazide  -ketamine  -metformin  -methazolamide  -quinidine  -ranitidine  -sodium bicarbonate  -triamterene  This list may not describe all possible interactions. Give your health care provider a  list of all the medicines, herbs, non-prescription drugs, or dietary supplements you use. Also tell them if you smoke, drink alcohol, or use illegal drugs. Some items may interact with your medicine.  What should I watch for while using this medicine?  Visit your doctor or health care professional for regular checks on your progress. Check with your doctor or health care professional if there is no improvement in your symptoms or if they get worse.  You may get drowsy or dizzy. Do not drive, use machinery, or do anything that needs mental alertness until you know how this drug affects you. Do not stand or sit up quickly, especially if you are an older patient. This reduces the risk of dizzy or fainting spells. Alcohol can make you more drowsy and dizzy. Avoid alcoholic drinks.  What side effects may I notice from receiving this medicine?  Side effects that you should report to your doctor or health care professional as soon as possible:  -allergic reactions like skin rash, itching or hives, swelling of the face, lips, or tongue  -agitation or a feeling of restlessness  -depressed mood  -dizziness  -hallucinations  -redness, blistering, peeling or loosening of the skin, including inside the mouth  -seizures  -vomiting  Side effects that usually do not require medical attention (report to your doctor or health care professional if they continue or are bothersome):  -constipation  -diarrhea  -headache  -nausea  -trouble sleeping  This   F). Throw away any unused medicine after the expiration date. NOTE: This sheet is a summary. It may not cover all possible information. If you have questions about this  medicine, talk to your doctor, pharmacist, or health care provider.  2018 Elsevier/Gold Standard (2012-12-08 14:10:42)

## 2016-11-23 ENCOUNTER — Telehealth: Payer: Self-pay | Admitting: *Deleted

## 2016-11-23 NOTE — Telephone Encounter (Signed)
Called and left message for patient to call the office to schedule appt per neurology notes.   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

## 2016-11-26 ENCOUNTER — Ambulatory Visit: Payer: Self-pay | Admitting: Internal Medicine

## 2016-11-26 NOTE — Telephone Encounter (Signed)
Patient called made appt for 11/28/16 at 10:00 at [facility].

## 2016-11-28 ENCOUNTER — Non-Acute Institutional Stay: Payer: Medicare Other | Admitting: Internal Medicine

## 2016-11-28 ENCOUNTER — Encounter: Payer: Self-pay | Admitting: Internal Medicine

## 2016-11-28 VITALS — BP 122/70 | HR 73 | Temp 97.6°F | Wt 140.0 lb

## 2016-11-28 DIAGNOSIS — R195 Other fecal abnormalities: Secondary | ICD-10-CM | POA: Diagnosis not present

## 2016-11-28 DIAGNOSIS — F1021 Alcohol dependence, in remission: Secondary | ICD-10-CM | POA: Diagnosis not present

## 2016-11-28 DIAGNOSIS — Z23 Encounter for immunization: Secondary | ICD-10-CM

## 2016-11-28 DIAGNOSIS — M810 Age-related osteoporosis without current pathological fracture: Secondary | ICD-10-CM

## 2016-11-28 DIAGNOSIS — R413 Other amnesia: Secondary | ICD-10-CM

## 2016-11-28 DIAGNOSIS — I609 Nontraumatic subarachnoid hemorrhage, unspecified: Secondary | ICD-10-CM | POA: Diagnosis not present

## 2016-11-28 DIAGNOSIS — E785 Hyperlipidemia, unspecified: Secondary | ICD-10-CM

## 2016-11-28 MED ORDER — ZOSTER VAC RECOMB ADJUVANTED 50 MCG/0.5ML IM SUSR: 0.5000 mL | Freq: Once | INTRAMUSCULAR | 1 refills | Status: AC

## 2016-11-28 NOTE — Progress Notes (Signed)
I agree with the assessment and plan, as directed by Dr Bennie Hind, MD

## 2016-11-28 NOTE — Patient Instructions (Signed)
Come back next Tuesday at [facility] for your fasting labwork.   I will then see you in 6 mos for med mgt.

## 2016-11-28 NOTE — Progress Notes (Signed)
Location:  Occupational psychologist of Service:  Clinic (12)  Provider: Georgio Hattabaugh L. Mariea Clonts, D.O., C.M.D.  Code Status: DNR Goals of Care:  Advanced Directives 11/28/2016  Does Patient Have a Medical Advance Directive? Yes  Type of Advance Directive [employer]  Does patient want to make changes to medical advance directive? -  Copy of [place] in Chart? Yes  Would patient like information on creating a medical advance directive? -   Chief Complaint  Patient presents with  . Medical Management of Chronic Issues    follow-up from neurology    HPI: Patient is a 82 y.o. female seen today for medical management of chronic diseases.    She is feeling fine.  No pain.  Osteoporosis:  Takes prolia twice a year.  Asks about taking her vitamins at night--advised that's fine--ca and D.  Depression and inappropriate behaviors:  Takes zoloft.  Says she's generally in a good mood, but overdrawn which bugs her.  She was told she was using bad language at the pool and she says she did not.    Dementia:  Says the memantine gives her diarrhea so she's not taking it.  Then, we discovered she is and it's in her pillbox and she has been taking 5mg  once a day.    She is not on cholesterol medication anymore.  Eats very healthily--says that once minute, then tells me about her sweets habits.  She's a recovering alcoholic and loves sugar.  Says she eats ice cream after dinner and had to let her pants out two inches.    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 Her grandson is helping with the finances, but she reports he's spending it.  She is no longer driving.   Wears pad due to diarrhea, she reports, not urinary incontinence.  Unclear if this is true.    She's a very poor historian and insists upon coming to appts alone.  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    Past Medical History:  Diagnosis  Date  . Arthritis    osteo  . Cystitis 03/02/2014   acute   . Dyslipidemia   . Hearing loss bil   got hearing aides.   Marland Kitchen UTI (urinary tract infection) 03/02/2014    Past Surgical History:  Procedure Laterality Date  . ABDOMINAL HYSTERECTOMY    . RADIOLOGY WITH ANESTHESIA N/A 03/04/2014   Procedure: RADIOLOGY WITH ANESTHESIA;  Surgeon: Rob Hickman, MD;  Location: [facility];  Service: Radiology;  Laterality: N/A;  . RADIOLOGY WITH ANESTHESIA N/A 03/04/2014   Procedure: RADIOLOGY WITH ANESTHESIA;  Surgeon: Medication Radiologist, MD;  Location: [facility];  Service: Radiology;  Laterality: N/A;  . VENTRICULOPERITONEAL SHUNT Right 03/15/2014   Procedure: SHUNT INSERTION VENTRICULAR-PERITONEAL;  Surgeon: Charlie Pitter, MD;  [facility];  Service: Neurosurgery;  Laterality: Right;    No Known Allergies  Outpatient Encounter Prescriptions as of 11/28/2016  Medication Sig  . Calcium Carbonate (CALCIUM 600 PO) Take by mouth daily.  . cholecalciferol (VITAMIN D) 1000 UNITS tablet Take 1,000 Units by mouth daily.  Marland Kitchen denosumab (PROLIA) 60 MG/ML SOLN injection Inject 60 mg into the skin every 6 (six) months. Administer in upper arm, thigh, or abdomen  . memantine (NAMENDA) 5 MG tablet Take 5 mg by mouth 2 (two) times daily. Change to 2 AM and 2 PM, 10 mg bid   .  RENOVA 0.02 % CREA Apply 1 application topically at bedtime.   . sertraline (ZOLOFT) 50 MG tablet 1 TABLET ONCE A DAY ORALLY 30 DAY(S)  . [DISCONTINUED] simvastatin (ZOCOR) 40 MG tablet Take 40 mg by mouth daily.    No facility-administered encounter medications on file as of 11/28/2016.     Review of Systems:  Review of Systems  Constitutional: Negative for fever and malaise/fatigue.  HENT: Negative for congestion.   Eyes: Negative for blurred vision.  Respiratory: Negative for shortness of breath.   Cardiovascular: Negative for chest pain and palpitations.  Gastrointestinal: Negative for abdominal pain.       Loose  stools  Genitourinary: Negative for dysuria, frequency and urgency.  Musculoskeletal: Negative for falls.  Skin: Negative for rash.  Neurological: Negative for dizziness, loss of consciousness and weakness.  Endo/Heme/Allergies: Bruises/bleeds easily.  Psychiatric/Behavioral: Positive for memory loss. Negative for depression. The patient is not nervous/anxious and does not have insomnia.     Health Maintenance  Topic Date Due  . TETANUS/TDAP  09/07/1953  . PNA vac Low Risk Adult (2 of 2 - PPSV23) 01/16/2014  . INFLUENZA VACCINE  10/03/2016  . DEXA SCAN  Completed    Physical Exam: Vitals:   11/28/16 0857  BP: 122/70  Pulse: 73  Temp: 97.6 F (36.4 C)  TempSrc: Oral  SpO2: 94%  Weight: 140 lb (63.5 kg)   Body mass index is 23.3 kg/m. Physical Exam  Constitutional: She is oriented to person, place, and time. She appears well-developed and well-nourished. No distress.  Cardiovascular: Normal rate, regular rhythm, normal heart sounds and intact distal pulses.   Pulmonary/Chest: Effort normal and breath sounds normal. No respiratory distress.  Abdominal: Soft. Bowel sounds are normal. She exhibits no distension. There is no tenderness.  Musculoskeletal: Normal range of motion.  Was unsteady when she stood, but stabilized as she walked  Neurological: She is alert and oriented to person, place, and time.  But poor historian, contradicts herself minute to minute  Skin: Skin is warm and dry. Capillary refill takes less than 2 seconds. There is pallor.  Psychiatric:  Seems to lack a filter when speaking--blurts out what comes to mind    Labs reviewed: Never came for labs I ordered before  Assessment/Plan 1. Memory loss -suspect due to mix of #2 and #5 -cont grandson's support with business (I'm amazed she's capable of this at all given her poor insight and poor historian status) -did not show up for wellness visit so memory test has not been recently rechecked -does report  she's no longer driving which is good (seems to confabulate so I'm not even sure this is accurate) MMSE - Mini Mental State Exam 04/20/2014  Orientation to time 4  Orientation to Place 4  Registration 3  Attention/ Calculation 3  Recall 0  Language- name 2 objects 2  Language- repeat 1  Language- follow 3 step command 3  Language- read & follow direction 1  Write a sentence 1  Copy design 1  Total score 23   2. SAH (subarachnoid hemorrhage) (HCC) -historical, may have caused some encephalomalacia and cognitive losses  3. Senile osteoporosis -cont prolia twice a year with ca and vitamin D daily (wants to take in evening which is fine--advised to add to pillbox)  4. Hyperlipidemia, unspecified hyperlipidemia type -not taking her statin therapy and never came for labs to reassess--ordered again today and pt given card with date and time on it to come to [facility] for labs  5. 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 Loose stools -unclear if she still has this problem, but says this is why she uses pads in her underwear--previously, I've been told this was for urinary incontinence which she says is stable  7. Need for shingles vaccine - Zoster Vac Recomb Adjuvanted [facility]) injection; Inject 0.5 mLs into the muscle once.  Dispense: 0.5 mL; Refill: 1  Labs/tests ordered:  Cbc, cmp, flp at [facility] Next appt:  6 mos med mgt--will need MMSE  Lancer Thurner L. Jammie Troup, D.O. [facility] [address] [place],  [address] Cell Phone (Mon-Fri 8am-5pm):  [phone] On Call:  [phone] & follow prompts after 5pm & weekends Office Phone:  [phone] Office Fax:  [phone]

## 2016-12-04 ENCOUNTER — Encounter: Payer: Self-pay | Admitting: Internal Medicine

## 2016-12-04 DIAGNOSIS — R413 Other amnesia: Secondary | ICD-10-CM | POA: Diagnosis not present

## 2016-12-04 DIAGNOSIS — E785 Hyperlipidemia, unspecified: Secondary | ICD-10-CM | POA: Diagnosis not present

## 2016-12-04 DIAGNOSIS — I609 Nontraumatic subarachnoid hemorrhage, unspecified: Secondary | ICD-10-CM | POA: Diagnosis not present

## 2016-12-04 LAB — CBC AND DIFFERENTIAL
HCT: 40 (ref 36–46)
Hemoglobin: 13.3 (ref 12.0–16.0)
Platelets: 269 (ref 150–399)
WBC: 5.3

## 2016-12-04 LAB — LIPID PANEL
Cholesterol: 266 — AB (ref 0–200)
HDL: 83 — AB (ref 35–70)
LDL Cholesterol: 163
Triglycerides: 100 (ref 40–160)

## 2016-12-04 LAB — HEPATIC FUNCTION PANEL
ALT: 13 (ref 7–35)
AST: 16 (ref 13–35)
Alkaline Phosphatase: 59 (ref 25–125)
Bilirubin, Total: 0.2

## 2016-12-04 LAB — BASIC METABOLIC PANEL
BUN: 18 (ref 4–21)
Creatinine: 0.8 (ref 0.5–1.1)
Glucose: 89
Potassium: 4.7 (ref 3.4–5.3)
Sodium: 138 (ref 137–147)

## 2016-12-06 ENCOUNTER — Encounter: Payer: Self-pay | Admitting: Internal Medicine

## 2016-12-27 DIAGNOSIS — Z23 Encounter for immunization: Secondary | ICD-10-CM | POA: Diagnosis not present

## 2017-01-08 ENCOUNTER — Ambulatory Visit: Payer: Medicare Other | Admitting: Neurology

## 2017-02-07 ENCOUNTER — Telehealth: Payer: Self-pay | Admitting: *Deleted

## 2017-02-07 NOTE — Telephone Encounter (Signed)
Will send to Earlie Server also.  I'm not sure who called her either.

## 2017-02-07 NOTE — Telephone Encounter (Signed)
Patient called and left message on Clinical Intake Voicemail stating that someone called her and wanted to know when her next "bone Injection" is and she stated that she is due for Prolia on 02/22/17. She doesn't know who called her.

## 2017-02-08 NOTE — Telephone Encounter (Signed)
Pt has not fully switched over to [facility] care.  She continues to see her previous PCP Dr. Laurann Montana at [place] so I am removing myself as PCP.  All care will be provided through [facility].

## 2017-02-08 NOTE — Telephone Encounter (Signed)
Spoke with patient and grandson, and pt is getting her prolia with her "PCP" Dr. Kelton Pillar at [place]. I advised to pt and grandson that pt can't have 2 PCP's. Pt is scheduled with [facility] 02/22/17 for prolia. Per our last records pt's last bone density was 2003. Grandson asked why we weren't giving the prolia? He also stated that her PCP been giving it. Again I stated she can't run in between both dr's. Grandson said he will find out and call us back.

## 2017-02-08 NOTE — Telephone Encounter (Signed)
I just saw patient 9/26 at [place] [facility].  Can we call [facility] and find out when they last saw her?  I'm confused myself now. I think perhaps the patient is not remembering all of this and she has been seeing me alone unfortunately.

## 2017-02-08 NOTE — Telephone Encounter (Signed)
Called spoke with [facility] and pt is still going there, her last OV was 07/03/2016, she's scheduled for Prolia 02/22/2017 and her next appt is 07/04/2017 for wellness.

## 2017-02-19 DIAGNOSIS — H353132 Nonexudative age-related macular degeneration, bilateral, intermediate dry stage: Secondary | ICD-10-CM | POA: Diagnosis not present

## 2017-02-19 DIAGNOSIS — Z9889 Other specified postprocedural states: Secondary | ICD-10-CM | POA: Diagnosis not present

## 2017-02-19 DIAGNOSIS — Z961 Presence of intraocular lens: Secondary | ICD-10-CM | POA: Diagnosis not present

## 2017-02-19 DIAGNOSIS — G910 Communicating hydrocephalus: Secondary | ICD-10-CM | POA: Insufficient documentation

## 2017-02-19 DIAGNOSIS — I671 Cerebral aneurysm, nonruptured: Secondary | ICD-10-CM | POA: Insufficient documentation

## 2017-02-27 DIAGNOSIS — M810 Age-related osteoporosis without current pathological fracture: Secondary | ICD-10-CM | POA: Diagnosis not present

## 2017-03-28 DIAGNOSIS — D229 Melanocytic nevi, unspecified: Secondary | ICD-10-CM | POA: Diagnosis not present

## 2017-04-02 ENCOUNTER — Other Ambulatory Visit: Payer: Self-pay | Admitting: Neurology

## 2017-04-02 ENCOUNTER — Telehealth: Payer: Self-pay | Admitting: Neurology

## 2017-04-02 MED ORDER — MEMANTINE HCL 10 MG PO TABS: 10.0000 mg | ORAL_TABLET | Freq: Two times a day (BID) | ORAL | 10 refills | Status: DC

## 2017-04-02 NOTE — Telephone Encounter (Signed)
I have ordered the 10mg  by mouth twice a day and sent to the pharmacy that was listed on file per their request.

## 2017-04-02 NOTE — Telephone Encounter (Signed)
Pt grandson(on DPR) has called stating that Dr Brett Fairy has pt on memantine (NAMENDA) 10 MG tablet but the pt has only been able to pick up 5mg .  Pt is running out fast because of her taking 2 to make 10 mg.  Pt grandson asking this be corrected by RN contacting the pharmacy [place], [place] - [address] [phone] (Phone) [phone] (Fax)   Please call grandson

## 2017-04-08 NOTE — Telephone Encounter (Signed)
Done..cdavis  °

## 2017-05-28 DIAGNOSIS — D229 Melanocytic nevi, unspecified: Secondary | ICD-10-CM | POA: Diagnosis not present

## 2017-05-29 ENCOUNTER — Encounter: Payer: Medicare Other | Admitting: Internal Medicine

## 2017-07-04 LAB — BASIC METABOLIC PANEL
BUN: 16 (ref 4–21)
Creatinine: 0.9 (ref 0.5–1.1)
GLUCOSE: 100
POTASSIUM: 4.4 (ref 3.4–5.3)
Sodium: 140 (ref 137–147)

## 2017-07-04 LAB — LIPID PANEL
Cholesterol: 302 — AB (ref 0–200)
HDL: 227 — AB (ref 35–70)
LDL Cholesterol: 206
Triglycerides: 107 (ref 40–160)

## 2017-07-04 LAB — HEPATIC FUNCTION PANEL
ALT: 11 (ref 7–35)
AST: 15 (ref 13–35)
Alkaline Phosphatase: 50 (ref 25–125)
BILIRUBIN, TOTAL: 0.5

## 2017-07-05 ENCOUNTER — Other Ambulatory Visit: Payer: Self-pay | Admitting: Family Medicine

## 2017-07-05 DIAGNOSIS — Z1231 Encounter for screening mammogram for malignant neoplasm of breast: Secondary | ICD-10-CM

## 2017-07-22 ENCOUNTER — Telehealth: Payer: Self-pay | Admitting: Family Medicine

## 2017-07-22 NOTE — Telephone Encounter (Signed)
I called the pt to schedule her AWV.  She stated that she only sees Dr. Mariea Clonts as needed.  Her PCP, Bethanne Ginger, does her wellness visits. VDM (DD)

## 2017-08-01 ENCOUNTER — Ambulatory Visit
Admission: RE | Admit: 2017-08-01 | Discharge: 2017-08-01 | Disposition: A | Discharge status: Home / Self Care | Payer: Medicare Other | Source: Ambulatory Visit | Attending: Family Medicine | Admitting: Family Medicine

## 2017-08-01 ENCOUNTER — Ambulatory Visit: Payer: Medicare Other

## 2017-08-01 DIAGNOSIS — Z1231 Encounter for screening mammogram for malignant neoplasm of breast: Secondary | ICD-10-CM

## 2017-08-22 DIAGNOSIS — M810 Age-related osteoporosis without current pathological fracture: Secondary | ICD-10-CM | POA: Diagnosis not present

## 2017-08-22 DIAGNOSIS — M8588 Other specified disorders of bone density and structure, other site: Secondary | ICD-10-CM | POA: Diagnosis not present

## 2017-08-22 LAB — HM DEXA SCAN

## 2017-08-30 DIAGNOSIS — M810 Age-related osteoporosis without current pathological fracture: Secondary | ICD-10-CM | POA: Diagnosis not present

## 2017-12-12 DIAGNOSIS — D489 Neoplasm of uncertain behavior, unspecified: Secondary | ICD-10-CM | POA: Diagnosis not present

## 2017-12-12 DIAGNOSIS — M79603 Pain in arm, unspecified: Secondary | ICD-10-CM | POA: Diagnosis not present

## 2017-12-17 DIAGNOSIS — H6123 Impacted cerumen, bilateral: Secondary | ICD-10-CM | POA: Diagnosis not present

## 2018-01-06 DIAGNOSIS — M810 Age-related osteoporosis without current pathological fracture: Secondary | ICD-10-CM | POA: Diagnosis not present

## 2018-01-06 DIAGNOSIS — D229 Melanocytic nevi, unspecified: Secondary | ICD-10-CM | POA: Diagnosis not present

## 2018-01-06 DIAGNOSIS — I609 Nontraumatic subarachnoid hemorrhage, unspecified: Secondary | ICD-10-CM | POA: Diagnosis not present

## 2018-01-06 DIAGNOSIS — Z23 Encounter for immunization: Secondary | ICD-10-CM | POA: Diagnosis not present

## 2018-01-06 DIAGNOSIS — E7800 Pure hypercholesterolemia, unspecified: Secondary | ICD-10-CM | POA: Diagnosis not present

## 2018-01-06 DIAGNOSIS — F0390 Unspecified dementia without behavioral disturbance: Secondary | ICD-10-CM | POA: Diagnosis not present

## 2018-01-06 DIAGNOSIS — B009 Herpesviral infection, unspecified: Secondary | ICD-10-CM | POA: Diagnosis not present

## 2018-01-06 LAB — BASIC METABOLIC PANEL
BUN: 18 (ref 4–21)
CREATININE: 1 (ref 0.5–1.1)
Glucose: 98
Potassium: 4.3 (ref 3.4–5.3)
SODIUM: 144 (ref 137–147)

## 2018-01-06 LAB — LIPID PANEL
Cholesterol: 188 (ref 0–200)
HDL: 104 — AB (ref 35–70)
LDL Cholesterol: 85
TRIGLYCERIDES: 93 (ref 40–160)

## 2018-01-06 LAB — HEPATIC FUNCTION PANEL
ALT: 17 (ref 7–35)
AST: 17 (ref 13–35)
Alkaline Phosphatase: 53 (ref 25–125)
Bilirubin, Total: 0.4

## 2018-02-21 DIAGNOSIS — H353132 Nonexudative age-related macular degeneration, bilateral, intermediate dry stage: Secondary | ICD-10-CM | POA: Diagnosis not present

## 2018-02-21 DIAGNOSIS — H524 Presbyopia: Secondary | ICD-10-CM | POA: Diagnosis not present

## 2018-02-21 DIAGNOSIS — Z961 Presence of intraocular lens: Secondary | ICD-10-CM | POA: Diagnosis not present

## 2018-02-21 DIAGNOSIS — Z9889 Other specified postprocedural states: Secondary | ICD-10-CM | POA: Diagnosis not present

## 2018-03-26 ENCOUNTER — Encounter: Payer: Self-pay | Admitting: Internal Medicine

## 2018-03-26 ENCOUNTER — Non-Acute Institutional Stay: Payer: Medicare Other | Admitting: Internal Medicine

## 2018-03-26 VITALS — BP 120/70 | HR 71 | Temp 97.8°F | Ht 64.0 in | Wt 139.0 lb

## 2018-03-26 DIAGNOSIS — F1021 Alcohol dependence, in remission: Secondary | ICD-10-CM | POA: Diagnosis not present

## 2018-03-26 DIAGNOSIS — F422 Mixed obsessional thoughts and acts: Secondary | ICD-10-CM

## 2018-03-26 DIAGNOSIS — Z23 Encounter for immunization: Secondary | ICD-10-CM

## 2018-03-26 DIAGNOSIS — M810 Age-related osteoporosis without current pathological fracture: Secondary | ICD-10-CM

## 2018-03-26 DIAGNOSIS — I609 Nontraumatic subarachnoid hemorrhage, unspecified: Secondary | ICD-10-CM

## 2018-03-26 DIAGNOSIS — G910 Communicating hydrocephalus: Secondary | ICD-10-CM | POA: Diagnosis not present

## 2018-03-26 DIAGNOSIS — F5103 Paradoxical insomnia: Secondary | ICD-10-CM

## 2018-03-26 MED ORDER — ZOSTER VAC RECOMB ADJUVANTED 50 MCG/0.5ML IM SUSR: 0.5000 mL | Freq: Once | INTRAMUSCULAR | 1 refills | Status: DC

## 2018-03-26 MED ORDER — SERTRALINE HCL 100 MG PO TABS: 100.0000 mg | ORAL_TABLET | Freq: Every day | ORAL | 3 refills | Status: DC

## 2018-03-26 NOTE — Progress Notes (Signed)
Provider:  Rexene Edison. Mariea Clonts, D.O., C.M.D. Location:  Occupational psychologist of Service:  Clinic (12)  Previous PCP: Kelton Pillar, MD Patient Care Team: Kelton Pillar, MD as PCP - General (Family Medicine) Dohmeier, Asencion Partridge, MD as Consulting Physician (Neurology)  Extended Emergency Contact Information Primary Emergency Contact: [place] Phone: [phone] Relation: Daughter Secondary Emergency Contact: Elder [place] of [place] Phone: [phone] Relation: Daughter  Goals of Care: Advanced Directive information Advanced Directives 11/28/2016  Does Patient Have a Medical Advance Directive? Yes  Type of Advance Directive [place]  Does patient want to make changes to medical advance directive? -  Copy of [place] in Chart? Yes  Would patient like information on creating a medical advance directive? -      Chief Complaint  Patient presents with  . Medical Management of Chronic Issues    re-establish care    HPI: Patient is a 84 y.o. female with h/o alcoholism in remission, subarachnoid hemorrhage from ruptured aneurysm, hydrocephalus, senile osteoporosis, urinary urgency, hyperlipidemia, and dementia seen today to reestablish with [facility].  Records have been received from Dr. Kelton Pillar.  Pt had previously established here, but then she started going back to Dr. Laurann Montana on her own.  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  Her grandson has written down several concerns about his grandmother as follows:  Anxiety, obsessiveness when working, depression, loneliness when not working--which are extreme.  She has not done well with benzodiazepines (not surprising given her dementia).  She is on zoloft just 50mg  daily.  She is worrying about her grandson who is a recover She will get in her head that she's  supposed to make a phone call.  Had an old message on her voicemail--kept calling the doctor's office.    She's been on prolia twice a year since 2012.  Had osteopenia last one density in 6/19.  She is on calcium and vitamin D therapy.    She's had difficulty sleeping--gets up and down, but has not apparently done well with sleep aids.  She just got new hearing aids at [employer]. She had in the ear aids at first, but messed with them a lot.  Now has them behind the ear.  Goes back early next month to tune them.  She takes namenda 10mg  po bid for her dementia.    Has not had shingrix vaccines per Dr. Delene Ruffini records.  Past Medical History:  Diagnosis Date  . Aneurysm ([place])   . Arthritis    osteo  . Cystitis 03/02/2014   acute   . Dyslipidemia   . Hearing loss bil   got hearing aides.   Marland Kitchen UTI (urinary tract infection) 03/02/2014   Past Surgical History:  Procedure Laterality Date  . ABDOMINAL HYSTERECTOMY    . RADIOLOGY WITH ANESTHESIA N/A 03/04/2014   Procedure: RADIOLOGY WITH ANESTHESIA;  Surgeon: Rob Hickman, MD;  Location: [facility];  Service: Radiology;  Laterality: N/A;  . RADIOLOGY WITH ANESTHESIA N/A 03/04/2014   Procedure: RADIOLOGY WITH ANESTHESIA;  Surgeon: Medication Radiologist, MD;  Location: [facility];  Service: Radiology;  Laterality: N/A;  . VENTRICULOPERITONEAL SHUNT Right 03/15/2014   Procedure: SHUNT INSERTION VENTRICULAR-PERITONEAL;  Surgeon: Charlie Pitter, MD;  [facility];  Service: Neurosurgery;  Laterality: Right;    Social History   Socioeconomic History  .  Marital status: Widowed    Spouse name: Not on file  . Number of children: 4  . Years of education: hs grad  . Highest education level: Not on file  Occupational History  . Not on file  Social Needs  . Financial resource strain: Not on file  . Food insecurity:    Worry: Not on file    Inability: Not on file  . Transportation needs:    Medical: Not on file    Non-medical:  Not on file  Tobacco Use  . Smoking status: Former Smoker    Packs/day: 0.50    Types: Cigarettes    Last attempt to quit: 03/05/2014    Years since quitting: 4.0  . Smokeless tobacco: Never Used  Substance and Sexual Activity  . Alcohol use: Yes    Alcohol/week: 0.0 standard drinks    Comment: recovering alcoholic- started drinking 2 weeks ago (07-04-14), stated not drinking. 08-16-14  . Drug use: No  . Sexual activity: Not on file  Lifestyle  . Physical activity:    Days per week: Not on file    Minutes per session: Not on file  . Stress: Not on file  Relationships  . Social connections:    Talks on phone: Not on file    Gets together: Not on file    Attends religious service: Not on file    Active member of club or organization: Not on file    Attends meetings of clubs or organizations: Not on file    Relationship status: Not on file  Other Topics Concern  . Not on file  Social History Narrative   Caffeine 1 cup am avg.  Lives at [place].      reports that she quit smoking about 4 years ago. Her smoking use included cigarettes. She smoked 0.50 packs per day. She has never used smokeless tobacco. She reports current alcohol use. She reports that she does not use drugs.  Functional Status Survey:    Family History  Problem Relation Age of Onset  . Hypertension Mother   . Hypertension Father     Health Maintenance  Topic Date Due  . PNA vac Low Risk Adult (2 of 2 - PPSV23) 01/16/2014  . TETANUS/TDAP  01/14/2022  . INFLUENZA VACCINE  Completed  . DEXA SCAN  Completed    No Known Allergies  Outpatient Encounter Medications as of 03/26/2018  Medication Sig  . Calcium Carbonate (CALCIUM 600 PO) Take by mouth daily.  . cholecalciferol (VITAMIN D) 1000 UNITS tablet Take 1,000 Units by mouth daily.  Marland Kitchen denosumab (PROLIA) 60 MG/ML SOLN injection Inject 60 mg into the skin every 6 (six) months. Administer in upper arm, thigh, or abdomen  . memantine (NAMENDA) 10 MG tablet  Take 1 tablet (10 mg total) by mouth 2 (two) times daily.  . sertraline (ZOLOFT) 50 MG tablet 1 TABLET ONCE A DAY ORALLY 30 DAY(S)  . Zoster Vaccine Adjuvanted [facility]) injection Inject 0.5 mLs into the muscle once for 1 dose.  . [DISCONTINUED] RENOVA 0.02 % CREA Apply 1 application topically at bedtime.    No facility-administered encounter medications on file as of 03/26/2018.     Review of Systems  Constitutional: Negative for chills, fever and malaise/fatigue.  HENT: Positive for hearing loss. Negative for congestion.   Eyes: Negative for blurred vision.       Reading glasses  Respiratory: Negative for cough and shortness of breath.   Cardiovascular: Negative for chest pain, palpitations and leg  swelling.  Gastrointestinal: Negative for abdominal pain, blood in stool, constipation, diarrhea, heartburn, melena, nausea and vomiting.       Had loose stools but resolved with aricept stopping  Genitourinary: Positive for urgency. Negative for dysuria, flank pain, frequency and hematuria.       Uses pads sometimes  Musculoskeletal: Positive for myalgias. Negative for back pain, falls and joint pain.       Right greater than left arm  Skin: Negative for itching and rash.  Neurological: Negative for dizziness and loss of consciousness.  Endo/Heme/Allergies: Does not bruise/bleed easily.  Psychiatric/Behavioral: Positive for memory loss. Negative for depression. The patient has insomnia. The patient is not nervous/anxious.        Obsessive thoughts and actions    Vitals:   03/26/18 0907  BP: 120/70  Pulse: 71  Temp: 97.8 F (36.6 C)  TempSrc: Oral  SpO2: 95%  Weight: 139 lb (63 kg)  Height: 5\' 4"  (1.626 m)   Body mass index is 23.86 kg/m. Physical Exam Vitals signs (caregiver Alyse Low present) reviewed.  Constitutional:      General: She is not in acute distress.    Appearance: Normal appearance. She is normal weight. She is not toxic-appearing.  HENT:     Head:  Normocephalic and atraumatic.     Right Ear: There is no impacted cerumen.     Left Ear: There is no impacted cerumen.     Ears:     Comments: Bilateral hearing aids, hearing well when turned up a little bit by caregiver    Nose: Nose normal.     Mouth/Throat:     Mouth: Mucous membranes are moist.  Neck:     Musculoskeletal: Normal range of motion and neck supple.  Cardiovascular:     Rate and Rhythm: Normal rate and regular rhythm.     Pulses: Normal pulses.     Heart sounds: Normal heart sounds.  Pulmonary:     Effort: Pulmonary effort is normal.     Breath sounds: Normal breath sounds.  Abdominal:     General: Abdomen is flat. Bowel sounds are normal.  Musculoskeletal: Normal range of motion.     Comments: No tenderness of muscles or difficulty with arm ROM  Skin:    General: Skin is warm and dry.     Capillary Refill: Capillary refill takes less than 2 seconds.  Neurological:     General: No focal deficit present.     Mental Status: She is alert. Mental status is at baseline.     Cranial Nerves: No cranial nerve deficit.     Sensory: No sensory deficit.     Gait: Gait abnormal.     Comments: Repeats herself and does perseverate on certain topics  Psychiatric:     Comments: Keeps saying derogatory things or giving her caregiver the finger then winking at me; when arm exam being done, she insisted upon removing her shirt to show her arms (nothing to see on skin)    Labs reviewed:  Labs reviewed from Dr. Delene Ruffini office, pending abstraction Basic Metabolic Panel: No results for input(s): NA, K, CL, CO2, GLUCOSE, BUN, CREATININE, CALCIUM, MG, PHOS in the last 8760 hours. Liver Function Tests: No results for input(s): AST, ALT, ALKPHOS, BILITOT, PROT, ALBUMIN in the last 8760 hours. No results for input(s): LIPASE, AMYLASE in the last 8760 hours. No results for input(s): AMMONIA in the last 8760 hours. CBC: No results for input(s): WBC, NEUTROABS, HGB, HCT, MCV, PLT in the  last 8760 hours. Cardiac Enzymes: No results for input(s): CKTOTAL, CKMB, CKMBINDEX, TROPONINI in the last 8760 hours. BNP: Invalid input(s): POCBNP No results found for: HGBA1C Lab Results  Component Value Date   TSH 1.683 03/11/2014   Lab Results  Component Value Date   VITAMINB12 662 03/10/2014   Lab Results  Component Value Date   FOLATE 18.0 03/10/2014   Lab Results  Component Value Date   IRON 55 03/10/2014   TIBC 264 03/10/2014   FERRITIN 162 03/10/2014   Assessment/Plan 1. SAH (subarachnoid hemorrhage) (HCC) -from ruptured aneurysm; seems dementia onset was after this event -cont caregiver support -cont namenda bid, but I wonder if it's too stimulatory?  Need to get more history about how she did with starting it next time -had diarrhea with aricept that did not improve until it was stopped  2. Alcoholism in remission [name]) -may also have contributed to her dementia  3. Communicating hydrocephalus ([place]) -need more history about this--records not clear--I'll delve through epic further in the future--s/p shunt in 2016  4. Need for shingles vaccine - Zoster Vaccine Adjuvanted [facility]) injection; Inject 0.5 mLs into the muscle once for 1 dose.  Dispense: 0.5 mL; Refill: 1  5. Paradoxical insomnia -sleeps in day too much and long hours -recommended melatonin 5mg  one hour before anticipated bedtime (suggested taking at 9pm to sleep at 10pm and get back up at her usual 6am)  6. Senile osteoporosis -will arrange prolia through [place] -last prolia injection at Dr. Delene Ruffini office was 02/14/18   7. Mixed obsessional thoughts and acts -increase zoloft to 100mg  po daily from 50mg  po daily to help with these behaviors, may also help with cussing  Labs/tests ordered:  No new  Arber Wiemers L. Shun Pletz, D.O. [facility] [address], [place] [address] Cell Phone (Mon-Fri 8am-5pm):  [phone] On Call:  [phone] &  follow prompts after 5pm & weekends Office Phone:  [phone] Office Fax:  [phone]

## 2018-03-26 NOTE — Patient Instructions (Addendum)
Melatonin 5mg  one hour before the time you want to be asleep.  I recommend taking it at 9pm so you can be asleep at 10pm and still get up at 6am.  Let's increase your zoloft to 100mg  (instead of 50mg ) to help with your obsessive thoughts and actions.     We will get you set up to take prolia through [place].    Please go [facility] at [facility].

## 2018-04-01 ENCOUNTER — Other Ambulatory Visit: Payer: Self-pay | Admitting: *Deleted

## 2018-04-01 MED ORDER — MELATONIN 5 MG PO CAPS: 1.0000 | ORAL_CAPSULE | Freq: Every evening | ORAL | 6 refills | Status: AC | PRN

## 2018-04-01 NOTE — Telephone Encounter (Signed)
Per last OV , suggested to take Melatonin, rx sent to pharmacy due to med management [facility]

## 2018-04-03 ENCOUNTER — Telehealth: Payer: Self-pay | Admitting: *Deleted

## 2018-04-03 DIAGNOSIS — Z23 Encounter for immunization: Secondary | ICD-10-CM

## 2018-04-03 MED ORDER — ZOSTER VAC RECOMB ADJUVANTED 50 MCG/0.5ML IM SUSR: 0.5000 mL | Freq: Once | INTRAMUSCULAR | 1 refills | Status: AC

## 2018-04-03 NOTE — Telephone Encounter (Signed)
Christa, Caregiver called and stated that you were going to fax for patient to get the [facility]ingles Vacc[facility] at [facility], they have over a 600 person waiting list and Caregiver is wanting the Rx faxed to [facility] instead. Vaccine is not in current list. Please Advise.

## 2018-04-03 NOTE — Telephone Encounter (Signed)
rx sent to pharmacy by e-script Spoke with caregiver and advised rx sent to pharmacy

## 2018-04-23 ENCOUNTER — Encounter: Payer: Self-pay | Admitting: Internal Medicine

## 2018-04-23 ENCOUNTER — Non-Acute Institutional Stay: Payer: Medicare Other | Admitting: Internal Medicine

## 2018-04-23 VITALS — BP 112/70 | HR 77 | Temp 98.0°F | Ht 65.0 in | Wt 140.0 lb

## 2018-04-23 DIAGNOSIS — F422 Mixed obsessional thoughts and acts: Secondary | ICD-10-CM

## 2018-04-23 DIAGNOSIS — M25511 Pain in right shoulder: Secondary | ICD-10-CM

## 2018-04-23 DIAGNOSIS — F1021 Alcohol dependence, in remission: Secondary | ICD-10-CM | POA: Diagnosis not present

## 2018-04-23 DIAGNOSIS — G8929 Other chronic pain: Secondary | ICD-10-CM

## 2018-04-23 DIAGNOSIS — G910 Communicating hydrocephalus: Secondary | ICD-10-CM

## 2018-04-23 DIAGNOSIS — M25512 Pain in left shoulder: Secondary | ICD-10-CM

## 2018-04-23 DIAGNOSIS — M810 Age-related osteoporosis without current pathological fracture: Secondary | ICD-10-CM

## 2018-04-23 DIAGNOSIS — F0281 Dementia in other diseases classified elsewhere with behavioral disturbance: Secondary | ICD-10-CM

## 2018-04-23 DIAGNOSIS — G3109 Other frontotemporal dementia: Secondary | ICD-10-CM | POA: Diagnosis not present

## 2018-04-23 NOTE — Patient Instructions (Addendum)
Tylenol up to 3000mg  per day for shoulder pain bilaterally.  Continue yoga and return to water aerobics.  You had your prolia shot 02/24/18.  You are not due again until at least 08/27/18.  Counseling options provided.    You wellness visit is due after 07/06/2018.

## 2018-04-23 NOTE — Progress Notes (Signed)
Location:  [facility]   Place of Service:   clinic  Provider: Cambell Rickenbach L. Mariea Clonts, D.O., C.M.D.  Goals of Care:  Advanced Directives 11/28/2016  Does Patient Have a Medical Advance Directive? Yes  Type of Advance Directive [place]  Does patient want to make changes to medical advance directive? -  Copy of [place] in Chart? Yes  Would patient like information on creating a medical advance directive? -     Chief Complaint  Patient presents with  . Acute Visit    left arm pain, left knee, check right breast  . discuss prolia    last prolia injection at Dr. Delene Ruffini office was 02/14/18 , not due yet    HPI: Patient is a 84 y.o. female seen today for an acute visit for not feeling well.  She thinks she hurt her arms in water aerobics.  sometimes muscles in legs hurt, gets up and then it goes away.  Discussed tylenol use.  Is doing yoga twice a wek.  Hasn't been to water aerobics in 2 mos.  Tried some hemp rub, but it didn't help her.    Feeling extra sleepy.    Right breast mole is ok.  May have grown a little  She is feeling down in the dumps.  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  Works every day but [employer].  Misses having people to talk to more often.    She is bothered, too, by not recalling phone numbers and messages.    She ha lost one of her new hearing aids.  She seems to be mishearing or saying something completely off the wall.  There is another hearing aid ordered.    She says she is sleeping alright, but she tells her caregiver she's sleeping terribly.  Might go to the bathroom.  As long as she takes her medicine, she falls asleep.  Sleeps 10-11 hrs.    Past Medical History:  Diagnosis Date  . Aneurysm ([place])   . Arthritis    osteo  . Cystitis 03/02/2014   acute   . Dyslipidemia   . Hearing loss bil   got hearing aides.   Marland Kitchen UTI (urinary tract infection) 03/02/2014    Past Surgical History:  Procedure Laterality  Date  . ABDOMINAL HYSTERECTOMY    . RADIOLOGY WITH ANESTHESIA N/A 03/04/2014   Procedure: RADIOLOGY WITH ANESTHESIA;  Surgeon: Rob Hickman, MD;  Location: [place];  Service: Radiology;  Laterality: N/A;  . RADIOLOGY WITH ANESTHESIA N/A 03/04/2014   Procedure: RADIOLOGY WITH ANESTHESIA;  Surgeon: Medication Radiologist, MD;  Location: [place];  Service: Radiology;  Laterality: N/A;  . VENTRICULOPERITONEAL SHUNT Right 03/15/2014   Procedure: SHUNT INSERTION VENTRICULAR-PERITONEAL;  Surgeon: Charlie Pitter, MD;  [facility];  Service: Neurosurgery;  Laterality: Right;    Allergies  Allergen Reactions  . Aricept [Donepezil Hcl] Diarrhea    Outpatient Encounter Medications as of 04/23/2018  Medication Sig  . Calcium Carbonate (CALCIUM 600 PO) Take by mouth daily.  . cholecalciferol (VITAMIN D) 1000 UNITS tablet Take 1,000 Units by mouth daily.  Marland Kitchen denosumab (PROLIA) 60 MG/ML SOLN injection Inject 60 mg into the skin every 6 (six) months. Administer in upper arm, thigh, or abdomen  . Melatonin 5 MG CAPS Take 1 capsule (5 mg total) by mouth at bedtime as needed.  . memantine (NAMENDA) 10 MG tablet Take 1 tablet (10 mg total) by mouth 2 (two) times daily.  Marland Kitchen  sertraline (ZOLOFT) 100 MG tablet Take 1 tablet (100 mg total) by mouth daily.   No facility-administered encounter medications on file as of 04/23/2018.     Review of Systems:  Review of Systems  Constitutional: Positive for malaise/fatigue. Negative for chills and fever.  HENT: Positive for hearing loss. Negative for congestion.   Eyes: Negative for blurred vision.  Respiratory: Negative for cough and shortness of breath.   Cardiovascular: Negative for chest pain, palpitations and leg swelling.  Gastrointestinal: Negative for abdominal pain, blood in stool, constipation, diarrhea, heartburn and melena.  Genitourinary: Negative for dysuria.  Musculoskeletal: Positive for joint pain and myalgias. Negative for back pain, falls  and neck pain.  Skin: Negative for itching and rash.  Neurological: Negative for dizziness and loss of consciousness.  Endo/Heme/Allergies: Does not bruise/bleed easily.  Psychiatric/Behavioral: Positive for memory loss. Negative for depression and suicidal ideas. The patient has insomnia. The patient is not nervous/anxious.        OCD    Health Maintenance  Topic Date Due  . PNA vac Low Risk Adult (2 of 2 - PPSV23) 01/16/2014  . TETANUS/TDAP  01/14/2022  . INFLUENZA VACCINE  Completed  . DEXA SCAN  Completed    Physical Exam: Vitals:   04/23/18 0918  BP: 112/70  Pulse: 77  Temp: 98 F (36.7 C)  TempSrc: Oral  SpO2: 96%  Weight: 140 lb (63.5 kg)  Height: 5\' 5"  (1.651 m)   Body mass index is 23.3 kg/m. Physical Exam Vitals signs reviewed.  Constitutional:      Appearance: Normal appearance.  HENT:     Head: Normocephalic and atraumatic.     Ears:     Comments: HOH Neck:     Musculoskeletal: Neck supple.  Cardiovascular:     Rate and Rhythm: Normal rate and regular rhythm.     Pulses: Normal pulses.     Heart sounds: Normal heart sounds.  Pulmonary:     Effort: Pulmonary effort is normal.     Breath sounds: Normal breath sounds.  Abdominal:     General: Bowel sounds are normal.  Musculoskeletal: Normal range of motion.        General: Tenderness present.     Comments: Bilateral shoulders especially around the back  Skin:    General: Skin is warm and dry.  Neurological:     General: No focal deficit present.     Mental Status: She is alert. Mental status is at baseline.     Cranial Nerves: No cranial nerve deficit.  Psychiatric:        Mood and Affect: Mood normal.     Labs reviewed: Basic Metabolic Panel: Recent Labs    07/04/17 01/06/18  NA 140 144  K 4.4 4.3  BUN 16 18  CREATININE 0.9 1.0   Liver Function Tests: Recent Labs    07/04/17 01/06/18  AST 15 17  ALT 11 17  ALKPHOS 50 53   No results for input(s): LIPASE, AMYLASE in the last  8760 hours. No results for input(s): AMMONIA in the last 8760 hours. CBC: No results for input(s): WBC, NEUTROABS, HGB, HCT, MCV, PLT in the last 8760 hours. Lipid Panel: Recent Labs    07/04/17 01/06/18  CHOL 302* 188  HDL 227* 104*  LDLCALC 206 85  TRIG 107 93   No results found for: HGBA1C  Procedures since last visit: No results found.  Assessment/Plan 1. Chronic pain of both shoulders -treat with exercise, tylenol; topicals haven't worked  2. Alcoholism in remission ([place]) -avoid narcotics  3. Communicating hydrocephalus (HCC) -s/p shunt  4. 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 Senile osteoporosis -prolia not due until June, cont vitamin D and weightbearing exercise  6. Mixed obsessional thoughts and acts -ongoing, but zoloft newly added last visit, cont to monitor this, cont melatonin for rest  Labs/tests ordered:  No new added today Next appt:  07/30/2018  Joseline Mccampbell L. Angelly Spearing, D.O. [facility] [address], [place] [address] Cell Phone (Mon-Fri 8am-5pm):  [phone] On Call:  [phone] & follow prompts after 5pm & weekends Office Phone:  [phone] Office Fax:  [phone]

## 2018-05-06 ENCOUNTER — Other Ambulatory Visit: Payer: Self-pay | Admitting: Internal Medicine

## 2018-05-06 ENCOUNTER — Ambulatory Visit: Payer: Medicare Other | Admitting: *Deleted

## 2018-05-06 DIAGNOSIS — M810 Age-related osteoporosis without current pathological fracture: Secondary | ICD-10-CM

## 2018-05-06 DIAGNOSIS — F0281 Dementia in other diseases classified elsewhere with behavioral disturbance: Secondary | ICD-10-CM

## 2018-05-06 DIAGNOSIS — G3109 Other frontotemporal dementia: Secondary | ICD-10-CM

## 2018-05-06 DIAGNOSIS — F422 Mixed obsessional thoughts and acts: Secondary | ICD-10-CM

## 2018-05-06 MED ORDER — SERTRALINE HCL 50 MG PO TABS: 50.0000 mg | ORAL_TABLET | Freq: Every day | ORAL | 3 refills | Status: DC

## 2018-05-06 MED ORDER — DENOSUMAB 60 MG/ML ~~LOC~~ SOSY
60.0000 mg | PREFILLED_SYRINGE | Freq: Once | SUBCUTANEOUS | Status: AC
  Administered 2018-05-06: 60 mg via SUBCUTANEOUS

## 2018-05-06 MED ORDER — MEMANTINE HCL 10 MG PO TABS: 10.0000 mg | ORAL_TABLET | Freq: Two times a day (BID) | ORAL | 11 refills | Status: DC

## 2018-05-06 NOTE — Progress Notes (Signed)
Pt not having any improvements with increased zoloft.  Still obsessing over things.  Had been here to get prolia.  She has now fallen 3 times in 3 weeks--once mechanical over some construction equipment, once in yoga onto her head, and once in the dining room again hitting her right temple.  It's unclear what happened during the episode in yoga b/c pt does not remember.  Her caregiver and I discussed and we opted to reduce the zoloft back to 50mg  due to lack of benefit and possibly increase in falls from this change.    Angelyne Terwilliger L. Kiaya Haliburton, D.O. [facility] [address], [place] [address] Cell Phone (Mon-Fri 8am-5pm):  [phone] On Call:  [phone] & follow prompts after 5pm & weekends Office Phone:  [phone] Office Fax:  [phone]

## 2018-06-16 ENCOUNTER — Other Ambulatory Visit: Payer: Self-pay

## 2018-06-16 ENCOUNTER — Ambulatory Visit: Payer: Medicare Other | Admitting: Internal Medicine

## 2018-06-23 ENCOUNTER — Other Ambulatory Visit: Payer: Self-pay | Admitting: Internal Medicine

## 2018-06-23 ENCOUNTER — Telehealth: Payer: Self-pay | Admitting: *Deleted

## 2018-06-23 DIAGNOSIS — B009 Herpesviral infection, unspecified: Secondary | ICD-10-CM

## 2018-06-23 MED ORDER — ACYCLOVIR 5 % EX CREA: 1.0000 "application " | TOPICAL_CREAM | CUTANEOUS | 0 refills | Status: DC

## 2018-06-23 NOTE — Telephone Encounter (Signed)
Christa Notified and agreed. Stated this was fine. Ignore previous message regarding [employer].

## 2018-06-23 NOTE — Progress Notes (Signed)
I remember reading this before when she first established.  I think she had a herpes rash of some sort and did respond to the valtrex cream.  Sent acyclovir cream to [facility].  I think she's getting her pillboxes filled with the clinic nurse at [facility] so meds are all to go through [place].

## 2018-06-23 NOTE — Telephone Encounter (Signed)
Sent acyclovir cream to [place] due to pillbox fills by [facility] clinic nurse.

## 2018-06-23 NOTE — Telephone Encounter (Signed)
Sharon [name] called and stated that patient had called her requesting her to see if she could get a refill on a Valtrex cream she had in the past. Sharon [name] was not aware of what was going on.   I called patient and asked her why she needed the Valtrex Cream and she stated that she had a rash on her back. Stated that it was not painful but just Itched and was red. Stated that Sharon [name] had given her the Valtrex Cream in the past for it but does not remember the Diagnosis and she could not call his office because he got in trouble with his taxes. Wants a refill or advise of what to put on it.   No available appointments at [facility]. Please Advise.

## 2018-06-23 NOTE — Progress Notes (Signed)
Caregiver, Christa called back and wants to know if you prescribed a Rx if we could fax to [facility] for her to pick up.

## 2018-06-23 NOTE — Telephone Encounter (Signed)
Received fax from [facility] stating that Acyclovir oint 5% costs $154.60. Stated that due to high cost of medication and inability to send partial amount, [facility] will not be able to send this medication. Needs alternative (no listing for alternative). Please Advise.

## 2018-06-23 NOTE — Telephone Encounter (Signed)
Spoke with Mliss Sax about this and she has handled this already, med list updated.

## 2018-06-29 ENCOUNTER — Encounter: Payer: Self-pay | Admitting: Internal Medicine

## 2018-07-09 ENCOUNTER — Encounter: Payer: Self-pay | Admitting: Internal Medicine

## 2018-07-09 ENCOUNTER — Non-Acute Institutional Stay: Payer: Medicare Other | Admitting: Internal Medicine

## 2018-07-09 ENCOUNTER — Other Ambulatory Visit: Payer: Self-pay

## 2018-07-09 VITALS — BP 120/70 | HR 80 | Temp 97.5°F | Ht 65.0 in | Wt 142.0 lb

## 2018-07-09 DIAGNOSIS — F02818 Dementia in other diseases classified elsewhere, unspecified severity, with other behavioral disturbance: Secondary | ICD-10-CM

## 2018-07-09 DIAGNOSIS — G3109 Other frontotemporal dementia: Secondary | ICD-10-CM | POA: Diagnosis not present

## 2018-07-09 DIAGNOSIS — F0281 Dementia in other diseases classified elsewhere with behavioral disturbance: Secondary | ICD-10-CM | POA: Diagnosis not present

## 2018-07-09 DIAGNOSIS — D225 Melanocytic nevi of trunk: Secondary | ICD-10-CM | POA: Diagnosis not present

## 2018-07-09 DIAGNOSIS — F422 Mixed obsessional thoughts and acts: Secondary | ICD-10-CM

## 2018-07-09 NOTE — Progress Notes (Signed)
Location:  [place] of Service:  Clinic (12)  Provider: Hafiz Irion L. Mariea Clonts, D.O., C.M.D.  Goals of Care:  Advanced Directives 07/09/2018  Does Patient Have a Medical Advance Directive? Yes  Type of Advance Directive [place]  Does patient want to make changes to medical advance directive? No - Patient declined  Copy of [place] in Chart? Yes - validated most recent copy scanned in chart (See row information)  Would patient like information on creating a medical advance directive? -     Chief Complaint  Patient presents with  . Acute Visit    lump in right breast    HPI: Patient is a 84 y.o. Sharon [name] seen today for an acute visit for a lump on her right breast.  It's smaller than a pencil eraser red raised spot.  It's not painful.  It's not itchy.  She was told by someone at some point that they could remove it.  She is unaware of it changing or growing.    Past Medical History:  Diagnosis Date  . Aneurysm ([place])   . Arthritis    osteo  . Cystitis 03/02/2014   acute   . Dyslipidemia   . Hearing loss bil   got hearing aides.   Marland Kitchen UTI (urinary tract infection) 03/02/2014    Past Surgical History:  Procedure Laterality Date  . ABDOMINAL HYSTERECTOMY    . RADIOLOGY WITH ANESTHESIA N/A 03/04/2014   Procedure: RADIOLOGY WITH ANESTHESIA;  Surgeon: Rob Hickman, MD;  Location: [place] [place];  Service: Radiology;  Laterality: N/A;  . RADIOLOGY WITH ANESTHESIA N/A 03/04/2014   Procedure: RADIOLOGY WITH ANESTHESIA;  Surgeon: Medication Radiologist, MD;  Location: [place];  Service: Radiology;  Laterality: N/A;  . VENTRICULOPERITONEAL SHUNT Right 03/15/2014   Procedure: SHUNT INSERTION VENTRICULAR-PERITONEAL;  Surgeon: Charlie Pitter, MD;  [facility];  Service: Neurosurgery;  Laterality: Right;    Allergies  Allergen Reactions  . Aricept [Donepezil Hcl] Diarrhea    Outpatient Encounter Medications as of 07/09/2018  Medication  Sig  . acyclovir ointment (ZOVIRAX) 5 % Apply 1 application topically every 4 (four) hours as needed.  Marland Kitchen atorvastatin (LIPITOR) 20 MG tablet Take 1 tablet by mouth daily.  . Calcium Carbonate (CALCIUM 600 PO) Take by mouth daily.  . cholecalciferol (VITAMIN D) 1000 UNITS tablet Take 1,000 Units by mouth daily.  Marland Kitchen denosumab (PROLIA) 60 MG/ML SOLN injection Inject 60 mg into the skin every 6 (six) months. Administer in upper arm, thigh, or abdomen  . Melatonin 5 MG CAPS Take 1 capsule (5 mg total) by mouth at bedtime as needed.  . memantine (NAMENDA) 10 MG tablet Take 1 tablet (10 mg total) by mouth 2 (two) times daily.  Marland Kitchen senna (SENOKOT) 8.6 MG TABS tablet Take 1 tablet by mouth daily as needed for mild constipation.  . sertraline (ZOLOFT) 50 MG tablet Take 1 tablet (50 mg total) by mouth daily.   No facility-administered encounter medications on file as of 07/09/2018.     Review of Systems:  Review of Systems  Constitutional: Negative for chills, fever and malaise/fatigue.  HENT: Negative for hearing loss.   Eyes: Negative for blurred vision.  Respiratory: Negative for shortness of breath and wheezing.   Cardiovascular: Negative for chest pain, palpitations and leg swelling.  Gastrointestinal: Negative for abdominal pain, blood in stool, constipation and melena.  Genitourinary: Negative for dysuria.  Musculoskeletal: Negative for falls, joint pain and myalgias.  Skin:  Negative for itching and rash.       Red papule on right breast  Neurological: Negative for loss of consciousness and headaches.  Endo/Heme/Allergies: Bruises/bleeds easily.  Psychiatric/Behavioral: Positive for memory loss. The patient is not nervous/anxious and does not have insomnia.     Health Maintenance  Topic Date Due  . PNA vac Low Risk Adult (2 of 2 - PPSV23) 01/16/2014  . INFLUENZA VACCINE  10/04/2018  . TETANUS/TDAP  01/14/2022  . DEXA SCAN  Completed    Physical Exam: Vitals:   07/09/18 0856  BP:  120/70  Pulse: 80  Temp: (!) 97.5 F (36.4 C)  TempSrc: Oral  SpO2: 96%  Weight: 142 lb (64.4 kg)  Height: 5\' 5"  (1.651 m)   Body mass index is 23.63 kg/m. Physical Exam Constitutional:      Appearance: Normal appearance.  Skin:    Comments: Right breast with less than pencil eraser-sized cherry red nevus (red papule)--nontender, no evidence of excoriation, no other masses or nodules palpable in breast or axilla  Neurological:     General: No focal deficit present.     Mental Status: She is alert.     Comments: Leans forward as she walks and moves quickly, does not lift feet well; no tremor or shuffle though  Psychiatric:     Comments: As soon as we finished checking the spot, she was on her feet, ready to leave     Labs reviewed: Basic Metabolic Panel: Recent Labs    01/06/18  NA 144  K 4.3  BUN 18  CREATININE 1.0   Liver Function Tests: Recent Labs    01/06/18  AST 17  ALT 17  ALKPHOS 53   No results for input(s): LIPASE, AMYLASE in the last 8760 hours. No results for input(s): AMMONIA in the last 8760 hours. CBC: No results for input(s): WBC, NEUTROABS, HGB, HCT, MCV, PLT in the last 8760 hours. Lipid Panel: Recent Labs    01/06/18  CHOL 188  HDL 104*  LDLCALC 85  TRIG 93   No results found for: HGBA1C  Assessment/Plan 1. Nevus of Sharon [name] breast -cherry nevus on right breast -does not appear worrisome  -will monitor appearance for future, but no need to remove this and I would not be the one to do it if she did want it removed (derm)  2. 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 Mixed obsessional thoughts and acts -she was obsessing over the nevus and had to be seen to put her mind at ease  Labs/tests ordered:  No new today Next appt:  07/30/2018  Calvina Liptak L. Lemon Sternberg,  D.O. [facility] [address], [place] [address] Cell Phone (Mon-Fri 8am-5pm):  [phone] On Call:  [phone] & follow prompts after 5pm & weekends Office Phone:  [phone] Office Fax:  [phone]

## 2018-07-30 ENCOUNTER — Encounter: Payer: Self-pay | Admitting: Internal Medicine

## 2018-09-09 ENCOUNTER — Other Ambulatory Visit: Payer: Self-pay | Admitting: Internal Medicine

## 2018-09-09 DIAGNOSIS — Z1231 Encounter for screening mammogram for malignant neoplasm of breast: Secondary | ICD-10-CM

## 2018-09-23 DIAGNOSIS — L748 Other eccrine sweat disorders: Secondary | ICD-10-CM | POA: Diagnosis not present

## 2018-09-23 DIAGNOSIS — L814 Other melanin hyperpigmentation: Secondary | ICD-10-CM | POA: Diagnosis not present

## 2018-09-23 DIAGNOSIS — L578 Other skin changes due to chronic exposure to nonionizing radiation: Secondary | ICD-10-CM | POA: Diagnosis not present

## 2018-09-23 DIAGNOSIS — D1801 Hemangioma of skin and subcutaneous tissue: Secondary | ICD-10-CM | POA: Diagnosis not present

## 2018-09-23 DIAGNOSIS — D485 Neoplasm of uncertain behavior of skin: Secondary | ICD-10-CM | POA: Diagnosis not present

## 2018-09-24 ENCOUNTER — Encounter: Payer: Self-pay | Admitting: Internal Medicine

## 2018-09-24 ENCOUNTER — Other Ambulatory Visit: Payer: Self-pay

## 2018-09-24 ENCOUNTER — Non-Acute Institutional Stay: Payer: Medicare Other | Admitting: Internal Medicine

## 2018-09-24 VITALS — BP 120/68 | HR 86 | Temp 98.3°F | Ht 65.0 in | Wt 140.0 lb

## 2018-09-24 DIAGNOSIS — M810 Age-related osteoporosis without current pathological fracture: Secondary | ICD-10-CM | POA: Diagnosis not present

## 2018-09-24 DIAGNOSIS — M79672 Pain in left foot: Secondary | ICD-10-CM | POA: Diagnosis not present

## 2018-09-24 DIAGNOSIS — M79671 Pain in right foot: Secondary | ICD-10-CM

## 2018-09-24 DIAGNOSIS — F02818 Dementia in other diseases classified elsewhere, unspecified severity, with other behavioral disturbance: Secondary | ICD-10-CM

## 2018-09-24 DIAGNOSIS — F0281 Dementia in other diseases classified elsewhere with behavioral disturbance: Secondary | ICD-10-CM

## 2018-09-24 DIAGNOSIS — G3109 Other frontotemporal dementia: Secondary | ICD-10-CM

## 2018-09-24 DIAGNOSIS — H9113 Presbycusis, bilateral: Secondary | ICD-10-CM

## 2018-09-24 DIAGNOSIS — K5909 Other constipation: Secondary | ICD-10-CM

## 2018-09-24 DIAGNOSIS — F422 Mixed obsessional thoughts and acts: Secondary | ICD-10-CM

## 2018-09-24 MED ORDER — SENNA 8.6 MG PO TABS: 1.0000 | ORAL_TABLET | Freq: Every day | ORAL | 3 refills | Status: AC | PRN

## 2018-09-24 MED ORDER — VITAMIN D 50 MCG (2000 UT) PO CAPS: 1.0000 | ORAL_CAPSULE | Freq: Every day | ORAL | 3 refills | Status: AC

## 2018-09-24 NOTE — Progress Notes (Signed)
Location:  Occupational psychologist of Service:  Clinic (12)  Provider: Yuliya Nova L. Mariea Clonts, D.O., C.M.D.  Goals of Care:  Advanced Directives 07/09/2018  Does Patient Have a Medical Advance Directive? Yes  Type of Advance Directive [place]  Does patient want to make changes to medical advance directive? No - Patient declined  Copy of [place] in Chart? Yes - validated most recent copy scanned in chart (See row information)  Would patient like information on creating a medical advance directive? -     Chief Complaint  Patient presents with  . Medical Management of Chronic Issues    4th follow-up    HPI: Patient is a 84 y.o. female seen today for medical management of chronic diseases.  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  She misses going out and about.   She's lost her hearing aid again.    She got the place taken off her breast yesterday (cherry nevus that she's been showing every provider she sees for a while now and wanting it addressed and all of [place] said it was benign).    Needs memory test redone--will arrange AWV to be done asap at office.    Feels fine.  Heels hurt.  Takes water aerobics three mornings a week.  She also goes to a balance class two mornings.  She does wear tennis shoes--she is going to try new insoles.  They're going to try exercises, too.   May need new sneakers.  Two weeks ago, she was not taking her pills out of her pillbox properly.  She's already taken tomorrow's pills as of today.  Last week was apparently ok and Christa says that was atypical for her to mess them up.  If it becomes more consistent, she may need a caregiver present at the time of her pills to take them not just calls from family.    Has little bitty pieces of bm not soft formed stool.  She is not taking senokot as needed b/c she overdid it so clinic nurse asked me to stop it due to diarrhea.  Now she c/o hard  stools.  Discussed and Christa will offer it to her from a prn box--pt cannot keep this box or she'll take it daily and have diarrhea again.    No improvement in her repetitive ocd like behaviors b/c zoloft could not be sustained at a higher dose due to fall that occurred.  We discussed considering a change to celexa--they opted NOT to do this at this time--might consider after her next MMSE.  She does not know what day it is.  Her son was going to call her and remind her of the day and make sure she takes her medications.  She's gotten more difficulty since coming back to work 2 days a week.  She may not remember pieces around the shop that had been there.    Her arm pain got better with tylenol.  She's not itching anymore on her back--had given her the herpes cream that was said to help when provided at last office.    Past Medical History:  Diagnosis Date  . Aneurysm ([place])   . Arthritis    osteo  . Cystitis 03/02/2014   acute   . Dyslipidemia   . Hearing loss bil   got hearing aides.   Marland Kitchen UTI (urinary tract infection) 03/02/2014    Past Surgical History:  Procedure Laterality Date  .  ABDOMINAL HYSTERECTOMY    . RADIOLOGY WITH ANESTHESIA N/A 03/04/2014   Procedure: RADIOLOGY WITH ANESTHESIA;  Surgeon: Rob Hickman, MD;  Location: [facility];  Service: Radiology;  Laterality: N/A;  . RADIOLOGY WITH ANESTHESIA N/A 03/04/2014   Procedure: RADIOLOGY WITH ANESTHESIA;  Surgeon: Medication Radiologist, MD;  Location: [facility];  Service: Radiology;  Laterality: N/A;  . VENTRICULOPERITONEAL SHUNT Right 03/15/2014   Procedure: SHUNT INSERTION VENTRICULAR-PERITONEAL;  Surgeon: Charlie Pitter, MD;  [facility];  Service: Neurosurgery;  Laterality: Right;    Allergies  Allergen Reactions  . Aricept [Donepezil Hcl] Diarrhea    Outpatient Encounter Medications as of 09/24/2018  Medication Sig  . atorvastatin (LIPITOR) 20 MG tablet Take 1 tablet by mouth daily.  . Calcium Carbonate  (CALCIUM 600 PO) Take by mouth daily.  . cholecalciferol (VITAMIN D) 1000 UNITS tablet Take 1,000 Units by mouth daily.  Marland Kitchen denosumab (PROLIA) 60 MG/ML SOLN injection Inject 60 mg into the skin every 6 (six) months. Administer in upper arm, thigh, or abdomen  . Melatonin 5 MG CAPS Take 1 capsule (5 mg total) by mouth at bedtime as needed.  . memantine (NAMENDA) 10 MG tablet Take 1 tablet (10 mg total) by mouth 2 (two) times daily.  Marland Kitchen senna (SENOKOT) 8.6 MG TABS tablet Take 1 tablet by mouth daily as needed for mild constipation.  . sertraline (ZOLOFT) 50 MG tablet Take 1 tablet (50 mg total) by mouth daily.  . [DISCONTINUED] acyclovir ointment (ZOVIRAX) 5 % Apply 1 application topically every 4 (four) hours as needed.   No facility-administered encounter medications on file as of 09/24/2018.     Review of Systems:  Review of Systems  Constitutional: Negative for chills, fever and malaise/fatigue.  HENT: Positive for hearing loss. Negative for congestion and sore throat.   Eyes: Negative for blurred vision.  Respiratory: Negative for cough and shortness of breath.   Cardiovascular: Negative for chest pain and leg swelling.  Gastrointestinal: Positive for constipation. Negative for abdominal pain, blood in stool, diarrhea, melena, nausea and vomiting.  Genitourinary: Negative for dysuria.  Musculoskeletal: Positive for falls. Negative for joint pain.  Skin: Negative for itching and rash.  Neurological: Negative for dizziness and loss of consciousness.  Endo/Heme/Allergies: Does not bruise/bleed easily.  Psychiatric/Behavioral: Positive for memory loss. Negative for depression. The patient is not nervous/anxious and does not have insomnia.        Repetitive behaviors with OCD features    Health Maintenance  Topic Date Due  . PNA vac Low Risk Adult (2 of 2 - PPSV23) 01/16/2014  . INFLUENZA VACCINE  10/04/2018  . TETANUS/TDAP  01/14/2022  . DEXA SCAN  Completed    Physical Exam:  Vitals:   09/24/18 1334  BP: 120/68  Pulse: 86  Temp: 98.3 F (36.8 C)  TempSrc: Oral  SpO2: 93%  Weight: 140 lb (63.5 kg)  Height: 5\' 5"  (1.651 m)   Body mass index is 23.3 kg/m. Physical Exam Vitals signs reviewed.  Constitutional:      General: She is not in acute distress.    Appearance: Normal appearance. She is not ill-appearing or toxic-appearing.     Comments: Disheveled hair but clean and neatly dressed  HENT:     Head: Normocephalic and atraumatic.  Eyes:     Extraocular Movements: Extraocular movements intact.     Pupils: Pupils are equal, round, and reactive to light.  Cardiovascular:     Rate and Rhythm: Normal rate and regular rhythm.  Pulses: Normal pulses.     Heart sounds: Normal heart sounds.  Pulmonary:     Effort: Pulmonary effort is normal.     Breath sounds: Normal breath sounds. No wheezing, rhonchi or rales.  Abdominal:     General: Bowel sounds are normal.     Palpations: Abdomen is soft.  Musculoskeletal: Normal range of motion.     Comments: No tenderness of heels or arches of feet though reports pain in heels  Skin:    General: Skin is warm and dry.     Capillary Refill: Capillary refill takes less than 2 seconds.  Neurological:     General: No focal deficit present.     Mental Status: She is alert.     Cranial Nerves: No cranial nerve deficit.     Motor: No weakness.     Comments: Posture worsening--leans forward more walking  Psychiatric:     Comments: Somewhat pressured speech, jumps topic to topic quickly, repeats herself often; asks about taking her dress off or lifting her dress up during exam; mood unchanged     Labs reviewed: Basic Metabolic Panel: Recent Labs    01/06/18  NA 144  K 4.3  BUN 18  CREATININE 1.0   Liver Function Tests: Recent Labs    01/06/18  AST 17  ALT 17  ALKPHOS 53   No results for input(s): LIPASE, AMYLASE in the last 8760 hours. No results for input(s): AMMONIA in the last 8760 hours.  CBC: No results for input(s): WBC, NEUTROABS, HGB, HCT, MCV, PLT in the last 8760 hours. Lipid Panel: Recent Labs    01/06/18  CHOL 188  HDL 104*  LDLCALC 85  TRIG 93   No results found for: HGBA1C  Procedures since last visit: No results found.  Assessment/Plan 1. 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 Mixed obsessional thoughts and acts -cont zoloft at low dose -may consider change to celexa next time to see if it helps more with this--it's also been shown to help with behaviors related to dementia  3. Other constipation -stools hard again w/o stool softener -encouraged hydration -will try senokot daily PRN to be offered by Christa  4. Presbycusis of both ears -lost another hearing aid and in process of getting new one  5. Heel pain, bilateral -seems related to less supportive shoewear -wear more supportive shoes, do plantar fasciitis exercises and use new inserts in other shoes for support, may take tylenol prn also which helped miraculously with her shoulder pain before  7.  Senile osteoporosis: Continue on prolia and calcium and D3 Push fluids to counteract calcium Continue weightbearing exercise (yoga, walking and balance program)  Labs/tests ordered:  No new Next appt:  09/30/2018 AWV at office   Tyjae Shvartsman L. Eliazer Hemphill, D.O. [facility] [address], [place] [address] Cell Phone (Mon-Fri 8am-5pm):  [phone] On Call:  [phone] & follow prompts after 5pm & weekends Office Phone:  [phone] Office Fax:  [phone]

## 2018-09-26 ENCOUNTER — Encounter: Payer: Self-pay | Admitting: Internal Medicine

## 2018-09-30 ENCOUNTER — Ambulatory Visit: Payer: Self-pay | Admitting: Family

## 2018-10-14 ENCOUNTER — Encounter: Payer: Self-pay | Admitting: Internal Medicine

## 2018-10-14 DIAGNOSIS — Z79899 Other long term (current) drug therapy: Secondary | ICD-10-CM | POA: Diagnosis not present

## 2018-10-14 DIAGNOSIS — R319 Hematuria, unspecified: Secondary | ICD-10-CM | POA: Diagnosis not present

## 2018-10-14 DIAGNOSIS — N390 Urinary tract infection, site not specified: Secondary | ICD-10-CM | POA: Diagnosis not present

## 2018-10-14 DIAGNOSIS — R4182 Altered mental status, unspecified: Secondary | ICD-10-CM | POA: Diagnosis not present

## 2018-10-15 ENCOUNTER — Encounter: Payer: Self-pay | Admitting: Internal Medicine

## 2018-10-15 ENCOUNTER — Other Ambulatory Visit: Payer: Self-pay

## 2018-10-15 ENCOUNTER — Non-Acute Institutional Stay: Payer: Medicare Other | Admitting: Internal Medicine

## 2018-10-15 VITALS — BP 120/60 | HR 93 | Temp 98.3°F | Ht 65.0 in | Wt 139.0 lb

## 2018-10-15 DIAGNOSIS — R413 Other amnesia: Secondary | ICD-10-CM

## 2018-10-15 DIAGNOSIS — R39198 Other difficulties with micturition: Secondary | ICD-10-CM | POA: Diagnosis not present

## 2018-10-15 DIAGNOSIS — H9113 Presbycusis, bilateral: Secondary | ICD-10-CM | POA: Diagnosis not present

## 2018-10-15 DIAGNOSIS — G3109 Other frontotemporal dementia: Secondary | ICD-10-CM | POA: Diagnosis not present

## 2018-10-15 DIAGNOSIS — F0281 Dementia in other diseases classified elsewhere with behavioral disturbance: Secondary | ICD-10-CM | POA: Diagnosis not present

## 2018-10-15 NOTE — Progress Notes (Signed)
Location:  Occupational psychologist of Service:  Clinic (12)  Provider: Greydis Stlouis L. Mariea Clonts, D.O., C.M.D.  Goals of Care:  Advanced Directives 07/09/2018  Does Patient Have a Medical Advance Directive? Yes  Type of Advance Directive [place]  Does patient want to make changes to medical advance directive? No - Patient declined  Copy of [place] in Chart? Yes - validated most recent copy scanned in chart (See row information)  Would patient like information on creating a medical advance directive? -     Chief Complaint  Patient presents with  . Acute Visit    UTI?    HPI: Patient is a 84 y.o. female seen today for acute visit for possible UTI.  UA c+s was requested by family due to worsening confusion and taking longer to pass her urine when she sits on the commode.  UA via "clean catch" negative for typical signs of UTI.  Preliminary culture with >100K lactose fermenting gram negative rods.  She feels fine.  She has no abdominal pain, dysuria, frequency, urgency or incontinence that she remembers and reports.  She is here alone.   She once again showed me the location on her right breast where her cherry nevus was removed and found to be benign.   She did not recall how to leave clinic and clinic nurse reported that she had shown her where the restroom was for the urine sample on the way to her office (they're a few feet apart) and she'd forgotten a minute later where it was.  She's afebrile and without chills.  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    Past Medical History:  Diagnosis Date  . Aneurysm ([place])   . Arthritis    osteo  . Cystitis 03/02/2014   acute   . Dyslipidemia   . Hearing loss bil   got hearing aides.     Past Surgical History:  Procedure Laterality Date  . ABDOMINAL HYSTERECTOMY    . RADIOLOGY WITH ANESTHESIA N/A 03/04/2014   Procedure: RADIOLOGY WITH ANESTHESIA;   Surgeon: Rob Hickman, MD;  Location: [facility];  Service: Radiology;  Laterality: N/A;  . RADIOLOGY WITH ANESTHESIA N/A 03/04/2014   Procedure: RADIOLOGY WITH ANESTHESIA;  Surgeon: Medication Radiologist, MD;  Location: [facility];  Service: Radiology;  Laterality: N/A;  . VENTRICULOPERITONEAL SHUNT Right 03/15/2014   Procedure: SHUNT INSERTION VENTRICULAR-PERITONEAL;  Surgeon: Charlie Pitter, MD;  [facility];  Service: Neurosurgery;  Laterality: Right;    Allergies  Allergen Reactions  . Aricept [Donepezil Hcl] Diarrhea    Outpatient Encounter Medications as of 10/15/2018  Medication Sig  . atorvastatin (LIPITOR) 20 MG tablet Take 1 tablet by mouth daily.  . Calcium Carbonate (CALCIUM 600 PO) Take 1 tablet by mouth daily.   . Cholecalciferol (VITAMIN D) 50 MCG (2000 UT) CAPS Take 1 capsule (2,000 Units total) by mouth daily.  Marland Kitchen denosumab (PROLIA) 60 MG/ML SOLN injection Inject 60 mg into the skin every 6 (six) months. Administer in upper arm, thigh, or abdomen  . Melatonin 5 MG CAPS Take 1 capsule (5 mg total) by mouth at bedtime as needed.  . memantine (NAMENDA) 10 MG tablet Take 1 tablet (10 mg total) by mouth 2 (two) times daily.  Marland Kitchen senna (SENOKOT) 8.6 MG TABS tablet Take 1 tablet (8.6 mg total) by mouth daily as needed for mild constipation.  . sertraline (  ZOLOFT) 50 MG tablet Take 1 tablet (50 mg total) by mouth daily.   No facility-administered encounter medications on file as of 10/15/2018.     Review of Systems:  Review of Systems  Constitutional: Negative for chills, fever and malaise/fatigue.  HENT: Positive for hearing loss.   Respiratory: Negative for cough and shortness of breath.   Cardiovascular: Negative for chest pain, palpitations and leg swelling.  Gastrointestinal: Negative for abdominal pain and diarrhea.  Genitourinary: Negative for dysuria, flank pain, frequency, hematuria and urgency.  Musculoskeletal: Negative for falls and joint pain.  Skin:  Negative for itching and rash.  Neurological: Negative for dizziness and loss of consciousness.  Endo/Heme/Allergies: Does not bruise/bleed easily.  Psychiatric/Behavioral: Positive for memory loss. Negative for depression. The patient is not nervous/anxious and does not have insomnia.     Health Maintenance  Topic Date Due  . PNA vac Low Risk Adult (2 of 2 - PPSV23) 01/16/2014  . INFLUENZA VACCINE  10/04/2018  . TETANUS/TDAP  01/14/2022  . DEXA SCAN  Completed    Physical Exam: Vitals:   10/15/18 1132  BP: 120/60  Pulse: 93  Temp: 98.3 F (36.8 C)  TempSrc: Oral  SpO2: 95%  Weight: 139 lb (63 kg)  Height: 5\' 5"  (1.651 m)   Body mass index is 23.13 kg/m. Physical Exam Vitals signs and nursing note reviewed.  Constitutional:      General: She is not in acute distress.    Appearance: She is normal weight. She is not toxic-appearing.  HENT:     Head: Normocephalic and atraumatic.  Cardiovascular:     Rate and Rhythm: Normal rate and regular rhythm.     Pulses: Normal pulses.     Heart sounds: Normal heart sounds.  Pulmonary:     Effort: Pulmonary effort is normal.     Breath sounds: Normal breath sounds.  Abdominal:     General: Bowel sounds are normal.     Palpations: Abdomen is soft. There is no mass.     Tenderness: There is no abdominal tenderness. There is no right CVA tenderness or left CVA tenderness.  Musculoskeletal: Normal range of motion.     Right lower leg: No edema.     Left lower leg: No edema.  Skin:    General: Skin is warm and dry.     Capillary Refill: Capillary refill takes less than 2 seconds.     Comments: Biopsy/nevus excision site still healing on right breast, no erythema, warmth or drainage  Neurological:     General: No focal deficit present.     Mental Status: She is alert.     Motor: No weakness.     Comments: Leans forward and walks quickly  Psychiatric:     Comments: Responds rapidly to questions     Labs reviewed: Basic  Metabolic Panel: Recent Labs    01/06/18  NA 144  K 4.3  BUN 18  CREATININE 1.0   Liver Function Tests: Recent Labs    01/06/18  AST 17  ALT 17  ALKPHOS 53   No results for input(s): LIPASE, AMYLASE in the last 8760 hours. No results for input(s): AMMONIA in the last 8760 hours. CBC: No results for input(s): WBC, NEUTROABS, HGB, HCT, MCV, PLT in the last 8760 hours. Lipid Panel: Recent Labs    01/06/18  CHOL 188  HDL 104*  LDLCALC 85  TRIG 93   Assessment/Plan 1. Memory loss -is worsening -she seems to have #2 as cause  of her memory loss, but some of it may be due to her NPH/shunt, subarachnoid hemorrhage and prior alcoholism  2. 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 Presbycusis of both ears -ongoing, makes memory seem even worse b/c she asks for everything to be repeated  4. Subjective change in urination -UA appeared negative -prolonged urination is definitely not a typical s/s of UTI without fever, abdominal pain, dysuria, etc.   -likely has some colonization of bacteria only   Labs/tests ordered:  UA c+s (culture pending)  Next appt:  11/11/2018  Marilouise Densmore L. Chee Kinslow, D.O. [facility] [address], [place] [address] Cell Phone (Mon-Fri 8am-5pm):  [phone] On Call:  [phone] & follow prompts after 5pm & weekends Office Phone:  [phone] Office Fax:  [phone]

## 2018-10-21 DIAGNOSIS — L748 Other eccrine sweat disorders: Secondary | ICD-10-CM | POA: Diagnosis not present

## 2018-10-21 DIAGNOSIS — D1801 Hemangioma of skin and subcutaneous tissue: Secondary | ICD-10-CM | POA: Diagnosis not present

## 2018-10-21 DIAGNOSIS — L570 Actinic keratosis: Secondary | ICD-10-CM | POA: Diagnosis not present

## 2018-10-21 DIAGNOSIS — L821 Other seborrheic keratosis: Secondary | ICD-10-CM | POA: Diagnosis not present

## 2018-10-21 DIAGNOSIS — L578 Other skin changes due to chronic exposure to nonionizing radiation: Secondary | ICD-10-CM | POA: Diagnosis not present

## 2018-10-21 DIAGNOSIS — L814 Other melanin hyperpigmentation: Secondary | ICD-10-CM | POA: Diagnosis not present

## 2018-10-28 ENCOUNTER — Ambulatory Visit: Payer: Medicare Other

## 2018-11-11 ENCOUNTER — Ambulatory Visit (INDEPENDENT_AMBULATORY_CARE_PROVIDER_SITE_OTHER): Payer: Medicare Other | Admitting: *Deleted

## 2018-11-11 ENCOUNTER — Other Ambulatory Visit: Payer: Self-pay

## 2018-11-11 DIAGNOSIS — M810 Age-related osteoporosis without current pathological fracture: Secondary | ICD-10-CM | POA: Diagnosis not present

## 2018-11-11 DIAGNOSIS — Z23 Encounter for immunization: Secondary | ICD-10-CM | POA: Diagnosis not present

## 2018-11-11 MED ORDER — DENOSUMAB 60 MG/ML ~~LOC~~ SOSY
60.0000 mg | PREFILLED_SYRINGE | Freq: Once | SUBCUTANEOUS | Status: AC
  Administered 2018-11-11: 60 mg via SUBCUTANEOUS

## 2018-12-09 ENCOUNTER — Ambulatory Visit: Payer: Medicare Other

## 2019-01-02 ENCOUNTER — Other Ambulatory Visit: Payer: Self-pay

## 2019-01-02 DIAGNOSIS — Z20822 Contact with and (suspected) exposure to covid-19: Secondary | ICD-10-CM

## 2019-01-02 DIAGNOSIS — Z20828 Contact with and (suspected) exposure to other viral communicable diseases: Secondary | ICD-10-CM | POA: Diagnosis not present

## 2019-01-03 LAB — NOVEL CORONAVIRUS, NAA: SARS-CoV-2, NAA: NOT DETECTED

## 2019-01-06 ENCOUNTER — Other Ambulatory Visit: Payer: Self-pay

## 2019-01-06 ENCOUNTER — Encounter: Payer: Medicare Other | Admitting: Nurse Practitioner

## 2019-01-06 NOTE — Progress Notes (Signed)
Err    This encounter was created in error - please disregard.

## 2019-01-20 DIAGNOSIS — L814 Other melanin hyperpigmentation: Secondary | ICD-10-CM | POA: Diagnosis not present

## 2019-01-20 DIAGNOSIS — L748 Other eccrine sweat disorders: Secondary | ICD-10-CM | POA: Diagnosis not present

## 2019-01-20 DIAGNOSIS — D1801 Hemangioma of skin and subcutaneous tissue: Secondary | ICD-10-CM | POA: Diagnosis not present

## 2019-01-20 DIAGNOSIS — L570 Actinic keratosis: Secondary | ICD-10-CM | POA: Diagnosis not present

## 2019-01-21 ENCOUNTER — Ambulatory Visit
Admission: RE | Admit: 2019-01-21 | Discharge: 2019-01-21 | Disposition: A | Discharge status: Home / Self Care | Payer: Medicare Other | Source: Ambulatory Visit | Attending: Internal Medicine | Admitting: Internal Medicine

## 2019-01-21 ENCOUNTER — Other Ambulatory Visit: Payer: Self-pay

## 2019-01-21 DIAGNOSIS — Z1231 Encounter for screening mammogram for malignant neoplasm of breast: Secondary | ICD-10-CM

## 2019-02-03 ENCOUNTER — Encounter: Payer: Self-pay | Admitting: Internal Medicine

## 2019-02-03 NOTE — Telephone Encounter (Signed)
Dr.Reed please advise if you have any additional recommendations. Thanks

## 2019-02-04 ENCOUNTER — Encounter: Payer: Self-pay | Admitting: Internal Medicine

## 2019-02-04 ENCOUNTER — Other Ambulatory Visit: Payer: Self-pay

## 2019-02-04 ENCOUNTER — Non-Acute Institutional Stay: Payer: Medicare Other | Admitting: Internal Medicine

## 2019-02-04 VITALS — BP 122/80 | HR 73 | Temp 97.5°F | Ht 65.0 in | Wt 141.0 lb

## 2019-02-04 DIAGNOSIS — G3109 Other frontotemporal dementia: Secondary | ICD-10-CM

## 2019-02-04 DIAGNOSIS — Z66 Do not resuscitate: Secondary | ICD-10-CM

## 2019-02-04 DIAGNOSIS — F320 Major depressive disorder, single episode, mild: Secondary | ICD-10-CM | POA: Diagnosis not present

## 2019-02-04 DIAGNOSIS — F0281 Dementia in other diseases classified elsewhere with behavioral disturbance: Secondary | ICD-10-CM

## 2019-02-04 DIAGNOSIS — F1021 Alcohol dependence, in remission: Secondary | ICD-10-CM

## 2019-02-04 NOTE — Progress Notes (Signed)
Location:  Occupational psychologist of Service:  Clinic (12)  Provider: Paydon Carll L. Mariea Clonts, D.O., C.M.D.  Code Status: DNR Goals of Care:  Advanced Directives 02/04/2019  Does Patient Have a Medical Advance Directive? Yes  Type of Paramedic of [place];Living will  Does patient want to make changes to medical advance directive? No - Patient declined  Copy of [place] in Chart? Yes - validated most recent copy scanned in chart (See row information)  Would patient like information on creating a medical advance directive? -     Chief Complaint  Patient presents with  . Medical Management of Chronic Issues    4 month follow-up. 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   . Immunizations    Discuss need for PNA   . Medication Management    Patient was not certain of all medications and stated everything should be the same     HPI: Patient is a 84 y.o. female seen today for medical management of chronic diseases. [place]    Here alone and disheveled.  Not wearing her hearing aids.  She says it is hard to be in [place] with the pandemic.  Business is terrible which is depressing, too.  She gets frustrated about other people telling her how to do things.  She is a recovering alcoholic.  She has not been drinking.   Wants to talk with a counselor.  Her caregiver, Trinna Post, actually sent our office a message yesterday for recommendations that were provided.    No pain.  Is up to date on pneumonia shot--had both prevnar and pneumovax.  The pneumovax was given a few days before her 67th bday so I don't think it's really necessary to repeat.  Pills remained in pillbox from earlier this week (mon and tues) unless box gets filled  wednesdays.  Today's were taken, but she took them while waiting to be seen.   Past Medical History:  Diagnosis Date  . Aneurysm ([place])   . Arthritis    osteo  . Cystitis 03/02/2014   acute   . Dyslipidemia   . Hearing loss bil   got hearing aides.     Past Surgical History:  Procedure Laterality Date  . ABDOMINAL HYSTERECTOMY    . RADIOLOGY WITH ANESTHESIA N/A 03/04/2014   Procedure: RADIOLOGY WITH ANESTHESIA;  Surgeon: Rob Hickman, MD;  Location: [facility];  Service: Radiology;  Laterality: N/A;  . RADIOLOGY WITH ANESTHESIA N/A 03/04/2014   Procedure: RADIOLOGY WITH ANESTHESIA;  Surgeon: Medication Radiologist, MD;  Location: [facility];  Service: Radiology;  Laterality: N/A;  . VENTRICULOPERITONEAL SHUNT Right 03/15/2014   Procedure: SHUNT INSERTION VENTRICULAR-PERITONEAL;  Surgeon: Charlie Pitter, MD;  Location: [facility];  Service: Neurosurgery;  Laterality: Right;    Allergies  Allergen Reactions  . Aricept [Donepezil Hcl] Diarrhea    Outpatient Encounter Medications as of 02/04/2019  Medication Sig  . atorvastatin (LIPITOR) 20 MG tablet Take 1 tablet by mouth daily.  . Calcium Carbonate (CALCIUM 600 PO) Take 1 tablet by mouth daily.   . Cholecalciferol (VITAMIN D) 50 MCG (2000 UT) CAPS Take 1 capsule (2,000 Units total) by mouth daily.  Marland Kitchen denosumab (PROLIA) 60 MG/ML SOLN injection Inject 60 mg  into the skin every 6 (six) months. Administer in upper arm, thigh, or abdomen  . Melatonin 5 MG CAPS Take 1 capsule (5 mg total) by mouth at bedtime as needed.  . memantine (NAMENDA) 10 MG tablet Take 1 tablet (10 mg total) by mouth 2 (two) times daily.  Marland Kitchen senna (SENOKOT) 8.6 MG TABS tablet Take 1 tablet (8.6 mg total) by mouth daily as needed for mild constipation.  . sertraline (ZOLOFT) 50 MG tablet Take 1 tablet (50 mg total) by mouth daily.   No facility-administered encounter medications on file as of 02/04/2019.     Review of Systems:  Review of Systems  Constitutional:  Negative for chills, fever and malaise/fatigue.  HENT: Positive for hearing loss.   Eyes: Negative for blurred vision.  Cardiovascular: Negative for chest pain, palpitations and leg swelling.  Skin: Negative for itching and rash.    Health Maintenance  Topic Date Due  . PNA vac Low Risk Adult (2 of 2 - PPSV23) 01/16/2014  . TETANUS/TDAP  01/14/2022  . INFLUENZA VACCINE  Completed  . DEXA SCAN  Completed    Physical Exam: Vitals:   02/04/19 1113  BP: 122/80  Pulse: 73  Temp: (!) 97.5 F (36.4 C)  TempSrc: Temporal  SpO2: 94%  Weight: 141 lb (64 kg)  Height: 5\' 5"  (1.651 m)   Body mass index is 23.46 kg/m. Physical Exam Vitals signs reviewed.  Constitutional:      General: She is not in acute distress.    Appearance: She is not ill-appearing or toxic-appearing.     Comments: Disheveled this morning (says "I look like hell this morning")  HENT:     Head: Normocephalic and atraumatic.     Ears:     Comments: HOH and not wearing hearing aids Eyes:     Extraocular Movements: Extraocular movements intact.     Pupils: Pupils are equal, round, and reactive to light.  Cardiovascular:     Rate and Rhythm: Normal rate and regular rhythm.     Pulses: Normal pulses.     Heart sounds: Normal heart sounds.  Pulmonary:     Effort: Pulmonary effort is normal.     Breath sounds: Normal breath sounds.  Abdominal:     General: Bowel sounds are normal.  Musculoskeletal: Normal range of motion.     Right lower leg: No edema.     Left lower leg: No edema.  Skin:    General: Skin is warm and dry.  Neurological:     General: No focal deficit present.     Mental Status: She is alert.     Cranial Nerves: No cranial nerve deficit.     Motor: No weakness.     Gait: Gait normal.     Comments: Pleasant but confused  Psychiatric:        Mood and Affect: Mood normal.        Behavior: Behavior normal.     Labs reviewed: Basic Metabolic Panel: No results for input(s): NA, K, CL,  CO2, GLUCOSE, BUN, CREATININE, CALCIUM, MG, PHOS, TSH in the last 8760 hours. Liver Function Tests: No results for input(s): AST, ALT, ALKPHOS, BILITOT, PROT, ALBUMIN in the last 8760 hours. No results for input(s): LIPASE, AMYLASE in the last 8760 hours. No results for input(s): AMMONIA in the last 8760 hours. CBC: No results for input(s): WBC, NEUTROABS, HGB, HCT, MCV, PLT in the last 8760 hours. Lipid Panel: No results for input(s): CHOL, HDL, LDLCALC, TRIG, CHOLHDL, LDLDIRECT  in the last 8760 hours. No results found for: HGBA1C  Procedures since last visit: Mm 3d Screen Breast Bilateral  Result Date: 01/21/2019 CLINICAL DATA:  Screening. EXAM: DIGITAL SCREENING BILATERAL MAMMOGRAM WITH TOMO AND CAD COMPARISON:  Previous exam(s). ACR Breast Density Category c: The breast tissue is heterogeneously dense, which may obscure small masses. FINDINGS: There are no findings suspicious for malignancy. Images were processed with CAD. IMPRESSION: No mammographic evidence of malignancy. A result letter of this screening mammogram will be mailed directly to the patient. RECOMMENDATION: Screening mammogram in one year. (Code:SM-B-01Y) BI-RADS CATEGORY  1: Negative. Electronically Signed   By: Lovey Newcomer M.D.   On: 01/21/2019 16:15    Assessment/Plan 1. Depression, major, single episode, mild ([place]) -did not tolerate higher dose of zoloft before -needs counseling though I'm not clear the degree of benefit she will garner from it with her degree of cognitive loss--this has clearly worsened in the past few months  2. Behavioral variant frontotemporal dementia ([place]) -with prior subarachnoid hemorrhage that also likely contributed to her cognitive changes -has lost inhibitions and always wants to take clothes off for examination even when unnecessary, has a lot of compulsive tendencies, as well -ideally should be in [place] environment -cont caregiver support--she was going to try to arrange counseling for pt  who is agreeable to it--locations provided  3. Alcoholism in remission [facility]) -remains so, would benefit from counseling due to this history, as well  4. DNR (do not resuscitate) - DNR (Do Not Resuscitate)  Labs/tests ordered:  NEEDS LABS ORDERED when seen next Next appt:  05/13/2019  Tyran Huser L. Zaiah Eckerson, D.O. [facility] [address],  [address] Cell Phone (Mon-Fri 8am-5pm):  [phone] On Call:  [phone] & follow prompts after 5pm & weekends Office Phone:  [phone] Office Fax:  [phone]

## 2019-02-24 ENCOUNTER — Encounter: Payer: Self-pay | Admitting: Internal Medicine

## 2019-02-24 ENCOUNTER — Telehealth: Payer: Self-pay | Admitting: *Deleted

## 2019-02-24 DIAGNOSIS — F02818 Dementia in other diseases classified elsewhere, unspecified severity, with other behavioral disturbance: Secondary | ICD-10-CM

## 2019-02-24 DIAGNOSIS — F422 Mixed obsessional thoughts and acts: Secondary | ICD-10-CM

## 2019-02-24 DIAGNOSIS — F0281 Dementia in other diseases classified elsewhere with behavioral disturbance: Secondary | ICD-10-CM

## 2019-02-24 MED ORDER — SERTRALINE HCL 50 MG PO TABS: 50.0000 mg | ORAL_TABLET | Freq: Every day | ORAL | 0 refills | Status: DC

## 2019-02-24 MED ORDER — ATORVASTATIN CALCIUM 20 MG PO TABS: 20.0000 mg | ORAL_TABLET | Freq: Every day | ORAL | 0 refills | Status: DC

## 2019-02-24 MED ORDER — MEMANTINE HCL 10 MG PO TABS: 10.0000 mg | ORAL_TABLET | Freq: Two times a day (BID) | ORAL | 0 refills | Status: DC

## 2019-02-24 NOTE — Telephone Encounter (Signed)
Jearline, Lapan, Tiffany L, DO 53 minutes ago (7:52 AM)   Good Morning, I am reaching out to you on behalf of Sharon [name].  She left yesterday to go see her daughters for Christmas and we just realized she forgot her pills.    Is there any way for Doctor Joneen Caraway to call in a prescription to a pharmacy down in [place], [place] just go get her through to next Tuesday?  Please let me know as soon as possible. thanks, [employer] with [facility] and patient went out of town without her medications. Confirmed pharmacy with Caregiver and faxed Rx's. (Patient will be back in town next Tuesday).

## 2019-03-17 DIAGNOSIS — Z23 Encounter for immunization: Secondary | ICD-10-CM | POA: Diagnosis not present

## 2019-03-18 DIAGNOSIS — Z961 Presence of intraocular lens: Secondary | ICD-10-CM | POA: Diagnosis not present

## 2019-03-18 DIAGNOSIS — H353132 Nonexudative age-related macular degeneration, bilateral, intermediate dry stage: Secondary | ICD-10-CM | POA: Diagnosis not present

## 2019-03-24 DIAGNOSIS — L570 Actinic keratosis: Secondary | ICD-10-CM | POA: Diagnosis not present

## 2019-03-24 DIAGNOSIS — L821 Other seborrheic keratosis: Secondary | ICD-10-CM | POA: Diagnosis not present

## 2019-03-24 DIAGNOSIS — D1801 Hemangioma of skin and subcutaneous tissue: Secondary | ICD-10-CM | POA: Diagnosis not present

## 2019-03-24 DIAGNOSIS — L814 Other melanin hyperpigmentation: Secondary | ICD-10-CM | POA: Diagnosis not present

## 2019-03-25 ENCOUNTER — Other Ambulatory Visit: Payer: Self-pay

## 2019-03-25 ENCOUNTER — Encounter: Payer: Self-pay | Admitting: Nurse Practitioner

## 2019-03-25 ENCOUNTER — Ambulatory Visit (INDEPENDENT_AMBULATORY_CARE_PROVIDER_SITE_OTHER): Payer: Medicare Other | Admitting: Nurse Practitioner

## 2019-03-25 DIAGNOSIS — Z0000 Encounter for general adult medical examination without abnormal findings: Secondary | ICD-10-CM

## 2019-03-25 NOTE — Progress Notes (Signed)
   This service is provided via telemedicine  No vital signs collected/recorded due to the encounter was a telemedicine visit.   Location of patient (ex: home, work):  Work  Patient consents to a telephone visit:  Yes  Location of the provider (ex: office, home):  [employer], Office   Name of any referring provider:  Gayland Curry, DO  Names of all persons participating in the telemedicine service and their role in the encounter:  S.Chrae B/CMA, Sherrie Mustache, NP, Arnoldo Hooker (caregiver), and Patient   Time spent on call:  15 min with medical assistant

## 2019-03-25 NOTE — Patient Instructions (Addendum)
Ms. Sharon [name] , Thank you for taking time to come for your Medicare Wellness Visit. I appreciate your ongoing commitment to your health goals. Please review the following plan we discussed and let me know if I can assist you in the future.   Screening recommendations/referrals: Colonoscopy aged out. Mammogram aged out. Bone Density up to date Recommended yearly ophthalmology/optometry visit for glaucoma screening and checkup Recommended yearly dental visit for hygiene and checkup  Vaccinations: Influenza vaccine up to date Pneumococcal vaccine up to date Tdap vaccine up to date Shingles vaccine recommended, to go to the pharmacy  COVID- got the first vaccine, scheduled second- to complete this series first   Advanced directives: on file  Conditions/risks identified: progressive memory loss.   Next appointment: 1 year   Preventive Care 19 Years and Older, Female Preventive care refers to lifestyle choices and visits with your health care provider that can promote health and wellness. What does preventive care include?  A yearly physical exam. This is also called an annual well check.  Dental exams once or twice a year.  Routine eye exams. Ask your health care provider how often you should have your eyes checked.  Personal lifestyle choices, including:  Daily care of your teeth and gums.  Regular physical activity.  Eating a healthy diet.  Avoiding tobacco and drug use.  Limiting alcohol use.  Practicing safe sex.  Taking low-dose aspirin every day.  Taking vitamin and mineral supplements as recommended by your health care provider. What happens during an annual well check? The services and screenings done by your health care provider during your annual well check will depend on your age, overall health, lifestyle risk factors, and family history of disease. Counseling  Your health care provider may ask you questions about your:  Alcohol use.  Tobacco use.  Drug  use.  Emotional well-being.  Home and relationship well-being.  Sexual activity.  Eating habits.  History of falls.  Memory and ability to understand (cognition).  Work and work Statistician.  Reproductive health. Screening  You may have the following tests or measurements:  Height, weight, and BMI.  Blood pressure.  Lipid and cholesterol levels. These may be checked every 5 years, or more frequently if you are over 68 years old.  Skin check.  Lung cancer screening. You may have this screening every year starting at age 32 if you have a 30-pack-year history of smoking and currently smoke or have quit within the past 15 years.  Fecal occult blood test (FOBT) of the stool. You may have this test every year starting at age 92.  Flexible sigmoidoscopy or colonoscopy. You may have a sigmoidoscopy every 5 years or a colonoscopy every 10 years starting at age 74.  Hepatitis C blood test.  Hepatitis B blood test.  Sexually transmitted disease (STD) testing.  Diabetes screening. This is done by checking your blood sugar (glucose) after you have not eaten for a while (fasting). You may have this done every 1-3 years.  Bone density scan. This is done to screen for osteoporosis. You may have this done starting at age 3.  Mammogram. This may be done every 1-2 years. Talk to your health care provider about how often you should have regular mammograms. Talk with your health care provider about your test results, treatment options, and if necessary, the need for more tests. Vaccines  Your health care provider may recommend certain vaccines, such as:  Influenza vaccine. This is recommended every year.  Tetanus, diphtheria,  and acellular pertussis (Tdap, Td) vaccine. You may need a Td booster every 10 years.  Zoster vaccine. You may need this after age 65.  Pneumococcal 13-valent conjugate (PCV13) vaccine. One dose is recommended after age 61.  Pneumococcal polysaccharide  (PPSV23) vaccine. One dose is recommended after age 80. Talk to your health care provider about which screenings and vaccines you need and how often you need them. This information is not intended to replace advice given to you by your health care provider. Make sure you discuss any questions you have with your health care provider. Document Released: 03/18/2015 Document Revised: 11/09/2015 Document Reviewed: 12/21/2014 Elsevier Interactive Patient Education  2017 [place] Prevention in the Home Falls can cause injuries. They can happen to people of all ages. There are many things you can do to make your home safe and to help prevent falls. What can I do on the outside of my home?  Regularly fix the edges of walkways and driveways and fix any cracks.  Remove anything that might make you trip as you walk through a door, such as a raised step or threshold.  Trim any bushes or trees on the path to your home.  Use bright outdoor lighting.  Clear any walking paths of anything that might make someone trip, such as rocks or tools.  Regularly check to see if handrails are loose or broken. Make sure that both sides of any steps have handrails.  Any raised decks and porches should have guardrails on the edges.  Have any leaves, snow, or ice cleared regularly.  Use sand or salt on walking paths during winter.  Clean up any spills in your garage right away. This includes oil or grease spills. What can I do in the bathroom?  Use night lights.  Install grab bars by the toilet and in the tub and shower. Do not use towel bars as grab bars.  Use non-skid mats or decals in the tub or shower.  If you need to sit down in the shower, use a plastic, non-slip stool.  Keep the floor dry. Clean up any water that spills on the floor as soon as it happens.  Remove soap buildup in the tub or shower regularly.  Attach bath mats securely with double-sided non-slip rug tape.  Do not have  throw rugs and other things on the floor that can make you trip. What can I do in the bedroom?  Use night lights.  Make sure that you have a light by your bed that is easy to reach.  Do not use any sheets or blankets that are too big for your bed. They should not hang down onto the floor.  Have a firm chair that has side arms. You can use this for support while you get dressed.  Do not have throw rugs and other things on the floor that can make you trip. What can I do in the kitchen?  Clean up any spills right away.  Avoid walking on wet floors.  Keep items that you use a lot in easy-to-reach places.  If you need to reach something above you, use a strong step stool that has a grab bar.  Keep electrical cords out of the way.  Do not use floor polish or wax that makes floors slippery. If you must use wax, use non-skid floor wax.  Do not have throw rugs and other things on the floor that can make you trip. What can I do with my  stairs?  Do not leave any items on the stairs.  Make sure that there are handrails on both sides of the stairs and use them. Fix handrails that are broken or loose. Make sure that handrails are as long as the stairways.  Check any carpeting to make sure that it is firmly attached to the stairs. Fix any carpet that is loose or worn.  Avoid having throw rugs at the top or bottom of the stairs. If you do have throw rugs, attach them to the floor with carpet tape.  Make sure that you have a light switch at the top of the stairs and the bottom of the stairs. If you do not have them, ask someone to add them for you. What else can I do to help prevent falls?  Wear shoes that:  Do not have high heels.  Have rubber bottoms.  Are comfortable and fit you well.  Are closed at the toe. Do not wear sandals.  If you use a stepladder:  Make sure that it is fully opened. Do not climb a closed stepladder.  Make sure that both sides of the stepladder are  locked into place.  Ask someone to hold it for you, if possible.  Clearly mark and make sure that you can see:  Any grab bars or handrails.  First and last steps.  Where the edge of each step is.  Use tools that help you move around (mobility aids) if they are needed. These include:  Canes.  Walkers.  Scooters.  Crutches.  Turn on the lights when you go into a dark area. Replace any light bulbs as soon as they burn out.  Set up your furniture so you have a clear path. Avoid moving your furniture around.  If any of your floors are uneven, fix them.  If there are any pets around you, be aware of where they are.  Review your medicines with your doctor. Some medicines can make you feel dizzy. This can increase your chance of falling. Ask your doctor what other things that you can do to help prevent falls. This information is not intended to replace advice given to you by your health care provider. Make sure you discuss any questions you have with your health care provider. Document Released: 12/16/2008 Document Revised: 07/28/2015 Document Reviewed: 03/26/2014 Elsevier Interactive Patient Education  2017 [employer].

## 2019-03-25 NOTE — Progress Notes (Signed)
Subjective:   Sharon [name] is a 85 y.o. female who presents for Medicare Annual (Subsequent) preventive examination.  Review of Systems:   Cardiac Risk Factors include: dyslipidemia     Objective:     Vitals: There were no vitals taken for this visit.  There is no height or weight on file to calculate BMI.  Advanced Directives 03/25/2019 02/04/2019 07/09/2018 11/28/2016 05/02/2016 03/24/2015 10/19/2014  Does Patient Have a Medical Advance Directive? Yes Yes Yes Yes Yes No Yes  Type of Advance Directive Living will [place];Living will Healthcare Power of [place] will  Does patient want to make changes to medical advance directive? No - Patient declined No - Patient declined No - Patient declined - No - Patient declined - No - Patient declined  Copy of [place] in Chart? - Yes - validated most recent copy scanned in chart (See row information) Yes - validated most recent copy scanned in chart (See row information) Yes Yes - No - copy requested  Would patient like information on creating a medical advance directive? - - - - - No - patient declined information -    Tobacco Social History   Tobacco Use  Smoking Status Former Smoker  . Packs/day: 0.50  . Types: Cigarettes  . Quit date: 03/05/2014  . Years since quitting: 5.0  Smokeless Tobacco Never Used     Counseling given: Not Answered   Clinical Intake:  Pre-visit preparation completed: Yes  Pain : No/denies pain     BMI - recorded: 23.46 Nutritional Risks: None Diabetes: No  How often do you need to have someone help you when you read instructions, pamphlets, or other written materials from your doctor or pharmacy?: 3 - Sometimes What is the last grade level you completed in school?: high school        Past Medical History:  Diagnosis Date  . Aneurysm ([place])   . Arthritis    osteo  . Cystitis 03/02/2014     acute   . Dyslipidemia   . Hearing loss bil   got hearing aides.    Past Surgical History:  Procedure Laterality Date  . ABDOMINAL HYSTERECTOMY    . RADIOLOGY WITH ANESTHESIA N/A 03/04/2014   Procedure: RADIOLOGY WITH ANESTHESIA;  Surgeon: Rob Hickman, MD;  Location: [facility];  Service: Radiology;  Laterality: N/A;  . RADIOLOGY WITH ANESTHESIA N/A 03/04/2014   Procedure: RADIOLOGY WITH ANESTHESIA;  Surgeon: Medication Radiologist, MD;  Location: [facility];  Service: Radiology;  Laterality: N/A;  . VENTRICULOPERITONEAL SHUNT Right 03/15/2014   Procedure: SHUNT INSERTION VENTRICULAR-PERITONEAL;  Surgeon: Charlie Pitter, MD;  Location[facility];  Service: Neurosurgery;  Laterality: Right;   Family History  Problem Relation Age of Onset  . Hypertension Mother   . Hypertension Father    Social History   Socioeconomic History  . Marital status: Widowed    Spouse name: Not on file  . Number of children: 4  . Years of education: hs grad  . Highest education level: Not on file  Occupational History  . Not on file  Tobacco Use  . Smoking status: Former Smoker    Packs/day: 0.50    Types: Cigarettes    Quit date: 03/05/2014    Years since quitting: 5.0  . Smokeless tobacco: Never Used  Substance and Sexual Activity  . Alcohol use: Not Currently    Alcohol/week: 0.0 standard drinks  Comment: recovering alcoholic- started drinking 2 weeks ago (07-04-14), stated not drinking. 08-16-14  . Drug use: No  . Sexual activity: Not on file  Other Topics Concern  . Not on file  Social History Narrative   Caffeine 1 cup am avg.  Lives at [facility].     Social Determinants of Health   Financial Resource Strain:   . Difficulty of Paying Living Expenses: Not on file  Food Insecurity:   . Worried About Charity fundraiser in the Last Year: Not on file  . Ran Out of Food in the Last Year: Not on file  Transportation Needs:   . Lack of Transportation (Medical): Not on file  . Lack of  Transportation (Non-Medical): Not on file  Physical Activity:   . Days of Exercise per Week: Not on file  . Minutes of Exercise per Session: Not on file  Stress:   . Feeling of Stress : Not on file  Social Connections:   . Frequency of Communication with Friends and Family: Not on file  . Frequency of Social Gatherings with Friends and Family: Not on file  . Attends Religious Services: Not on file  . Active Member of Clubs or Organizations: Not on file  . Attends Archivist Meetings: Not on file  . Marital Status: Not on file    Outpatient Encounter Medications as of 03/25/2019  Medication Sig  . atorvastatin (LIPITOR) 20 MG tablet Take 1 tablet (20 mg total) by mouth daily.  . Calcium Carbonate (CALCIUM 600 PO) Take 1 tablet by mouth daily.   . Cholecalciferol (VITAMIN D) 50 MCG (2000 UT) CAPS Take 1 capsule (2,000 Units total) by mouth daily.  Marland Kitchen denosumab (PROLIA) 60 MG/ML SOLN injection Inject 60 mg into the skin every 6 (six) months. Administer in upper arm, thigh, or abdomen  . Melatonin 5 MG CAPS Take 1 capsule (5 mg total) by mouth at bedtime as needed.  . memantine (NAMENDA) 10 MG tablet Take 1 tablet (10 mg total) by mouth 2 (two) times daily.  Marland Kitchen senna (SENOKOT) 8.6 MG TABS tablet Take 1 tablet (8.6 mg total) by mouth daily as needed for mild constipation.  . sertraline (ZOLOFT) 50 MG tablet Take 1 tablet (50 mg total) by mouth daily.   No facility-administered encounter medications on file as of 03/25/2019.    Activities of Daily Living In your present state of health, do you have any difficulty performing the following activities: 03/25/2019  Hearing? Y  Vision? Y  Difficulty concentrating or making decisions? Y  Walking or climbing stairs? N  Dressing or bathing? N  Doing errands, shopping? Y  Preparing Food and eating ? N  Using the Toilet? N  In the past six months, have you accidently leaked urine? Y  Do you have problems with loss of bowel control? N    Managing your Medications? Y  Comment has assistance  Managing your Finances? Y  Comment has assistance  Housekeeping or managing your Housekeeping? Y  Comment has assistance  Some recent data might be hidden    Patient Care Team: Gayland Curry, DO as PCP - General (Geriatric Medicine) Dohmeier, Asencion Partridge, MD as Consulting Physician (Neurology)    Assessment:   This is a routine wellness examination for [place].  Exercise Activities and Dietary recommendations Current Exercise Habits: Home exercise routine, Type of exercise: walking, Time (Minutes): 30, Frequency (Times/Week): 4, Weekly Exercise (Minutes/Week): 120  Goals   None     Fall Risk  Fall Risk  03/25/2019 02/04/2019 09/24/2018 07/09/2018 04/23/2018  Falls in the past year? 0 0 0 0 0  Number falls in past yr: 0 0 0 0 0  Injury with Fall? 0 0 0 0 0   Is the patient's home free of loose throw rugs in walkways, pet beds, electrical cords, etc?   yes      Grab bars in the bathroom? yes      Handrails on the stairs?   yes      Adequate lighting?   yes  Timed Get Up and Go performed: na  Depression Screen PHQ 2/9 Scores 03/25/2019 02/04/2019 09/24/2018 07/09/2018  PHQ - 2 Score 0 4 0 0  PHQ- 9 Score - 7 - -     Cognitive Function MMSE - Mini Mental State Exam 04/20/2014  Orientation to time 4  Orientation to Place 4  Registration 3  Attention/ Calculation 3  Recall 0  Language- name 2 objects 2  Language- repeat 1  Language- follow 3 step command 3  Language- read & follow direction 1  Write a sentence 1  Copy design 1  Total score 23   Montreal Cognitive Assessment  11/14/2016 05/21/2016 11/21/2015 05/17/2015 02/15/2015  Visuospatial/ Executive (0/5) 4 5 5 5 4   Naming (0/3) 3 3 3 3 3   Attention: Read list of digits (0/2) 2 2 1 1 1   Attention: Read list of letters (0/1) 1 1 1 1 1   Attention: Serial 7 subtraction starting at 100 (0/3) 2 2 1 2 3   Language: Repeat phrase (0/2) 2 2 2 2 2   Language : Fluency (0/1) 1 0  1 1 0  Abstraction (0/2) 1 1 2 2 1   Delayed Recall (0/5) 3 3 2 3 2   Orientation (0/6) 6 6 6 5 6   Total 25 25 24 25 23   Adjusted Score (based on education) - - 24 25 23    6CIT Screen 03/25/2019  What Year? 0 points  What month? 0 points  What time? 3 points  Count back from 20 0 points  Months in reverse 0 points  Repeat phrase 2 points  Total Score 5    Immunization History  Administered Date(s) Administered  . Fluad Quad(high Dose 65+) 11/11/2018  . Influenza, High Dose Seasonal PF 12/27/2016, 01/06/2018  . Influenza-Unspecified 10/15/2011, 11/12/2012, 12/04/2015  . Pneumococcal Conjugate-13 01/16/2013  . Pneumococcal Polysaccharide-23 08/28/2001  . Td 03/30/2004  . Tdap 01/15/2012    Qualifies for Shingles Vaccine?yes, recommended after COVID series.   Screening Tests Health Maintenance  Topic Date Due  . PNA vac Low Risk Adult (2 of 2 - PPSV23) 01/16/2014  . TETANUS/TDAP  01/14/2022  . INFLUENZA VACCINE  Completed  . DEXA SCAN  Completed    Cancer Screenings: Lung: Low Dose CT Chest recommended if Age 58-80 years, 30 pack-year currently smoking OR have quit w/in 15years. Patient does not qualify. Breast:  Up to date on Mammogram? Yes   Up to date of Bone Density/Dexa? Yes Colorectal: aged out   Additional Screenings:  Hepatitis C Screening: aged out    Plan:      I have personally reviewed and noted the following in the patient's chart:   . Medical and social history . Use of alcohol, tobacco or illicit drugs  . Current medications and supplements . Functional ability and status . Nutritional status . Physical activity . Advanced directives . List of other physicians . Hospitalizations, surgeries, and ER visits in previous 12 months .  Vitals . Screenings to include cognitive, depression, and falls . Referrals and appointments  In addition, I have reviewed and discussed with patient certain preventive protocols, quality metrics, and best practice  recommendations. A written personalized care plan for preventive services as well as general preventive health recommendations were provided to patient.     Lauree Chandler, NP  03/25/2019

## 2019-04-16 DIAGNOSIS — Z23 Encounter for immunization: Secondary | ICD-10-CM | POA: Diagnosis not present

## 2019-04-22 DIAGNOSIS — L814 Other melanin hyperpigmentation: Secondary | ICD-10-CM | POA: Diagnosis not present

## 2019-04-22 DIAGNOSIS — D1801 Hemangioma of skin and subcutaneous tissue: Secondary | ICD-10-CM | POA: Diagnosis not present

## 2019-04-22 DIAGNOSIS — L578 Other skin changes due to chronic exposure to nonionizing radiation: Secondary | ICD-10-CM | POA: Diagnosis not present

## 2019-04-22 DIAGNOSIS — L748 Other eccrine sweat disorders: Secondary | ICD-10-CM | POA: Diagnosis not present

## 2019-04-22 DIAGNOSIS — L821 Other seborrheic keratosis: Secondary | ICD-10-CM | POA: Diagnosis not present

## 2019-04-22 DIAGNOSIS — L570 Actinic keratosis: Secondary | ICD-10-CM | POA: Diagnosis not present

## 2019-04-22 DIAGNOSIS — L738 Other specified follicular disorders: Secondary | ICD-10-CM | POA: Diagnosis not present

## 2019-05-13 ENCOUNTER — Ambulatory Visit: Payer: Medicare Other

## 2019-05-14 ENCOUNTER — Ambulatory Visit (INDEPENDENT_AMBULATORY_CARE_PROVIDER_SITE_OTHER): Payer: Medicare Other

## 2019-05-14 ENCOUNTER — Other Ambulatory Visit: Payer: Self-pay

## 2019-05-14 DIAGNOSIS — M810 Age-related osteoporosis without current pathological fracture: Secondary | ICD-10-CM | POA: Diagnosis not present

## 2019-05-14 MED ORDER — DENOSUMAB 60 MG/ML ~~LOC~~ SOSY
60.0000 mg | PREFILLED_SYRINGE | Freq: Once | SUBCUTANEOUS | Status: AC
  Administered 2019-05-14: 60 mg via SUBCUTANEOUS

## 2019-05-19 DIAGNOSIS — L738 Other specified follicular disorders: Secondary | ICD-10-CM | POA: Diagnosis not present

## 2019-05-19 DIAGNOSIS — L748 Other eccrine sweat disorders: Secondary | ICD-10-CM | POA: Diagnosis not present

## 2019-05-19 DIAGNOSIS — L570 Actinic keratosis: Secondary | ICD-10-CM | POA: Diagnosis not present

## 2019-05-19 DIAGNOSIS — D1801 Hemangioma of skin and subcutaneous tissue: Secondary | ICD-10-CM | POA: Diagnosis not present

## 2019-05-19 DIAGNOSIS — L821 Other seborrheic keratosis: Secondary | ICD-10-CM | POA: Diagnosis not present

## 2019-05-19 DIAGNOSIS — L814 Other melanin hyperpigmentation: Secondary | ICD-10-CM | POA: Diagnosis not present

## 2019-05-19 DIAGNOSIS — L578 Other skin changes due to chronic exposure to nonionizing radiation: Secondary | ICD-10-CM | POA: Diagnosis not present

## 2019-05-26 DIAGNOSIS — G3109 Other frontotemporal dementia: Secondary | ICD-10-CM | POA: Diagnosis not present

## 2019-05-26 DIAGNOSIS — F0281 Dementia in other diseases classified elsewhere with behavioral disturbance: Secondary | ICD-10-CM | POA: Diagnosis not present

## 2019-05-26 DIAGNOSIS — R41841 Cognitive communication deficit: Secondary | ICD-10-CM | POA: Diagnosis not present

## 2019-06-10 ENCOUNTER — Encounter: Payer: Self-pay | Admitting: Internal Medicine

## 2019-06-10 ENCOUNTER — Non-Acute Institutional Stay: Payer: Medicare Other | Admitting: Internal Medicine

## 2019-06-10 ENCOUNTER — Other Ambulatory Visit: Payer: Self-pay

## 2019-06-10 VITALS — BP 162/62 | HR 93 | Temp 97.9°F | Ht 65.0 in | Wt 143.6 lb

## 2019-06-10 DIAGNOSIS — M778 Other enthesopathies, not elsewhere classified: Secondary | ICD-10-CM | POA: Diagnosis not present

## 2019-06-10 DIAGNOSIS — F325 Major depressive disorder, single episode, in full remission: Secondary | ICD-10-CM | POA: Diagnosis not present

## 2019-06-10 DIAGNOSIS — F320 Major depressive disorder, single episode, mild: Secondary | ICD-10-CM | POA: Insufficient documentation

## 2019-06-10 DIAGNOSIS — G3109 Other frontotemporal dementia: Secondary | ICD-10-CM

## 2019-06-10 DIAGNOSIS — M1711 Unilateral primary osteoarthritis, right knee: Secondary | ICD-10-CM | POA: Diagnosis not present

## 2019-06-10 DIAGNOSIS — F1021 Alcohol dependence, in remission: Secondary | ICD-10-CM

## 2019-06-10 DIAGNOSIS — F0281 Dementia in other diseases classified elsewhere with behavioral disturbance: Secondary | ICD-10-CM

## 2019-06-10 NOTE — Patient Instructions (Addendum)
I will refer you to physical therapy for your left shoulder and right knee.  You may continue to use tylenol for pain.  You may try topical therapies for your knee and shoulder also.  Examples are biofreeze, aspercreme and voltaren gel.  I recommend you only do one exercise class per day.  If you start walking again, begin with just 10 minutes and gradually work up by 5 minutes.    Avoid taking medications and supplements provided by other people because they may interact with your medicines and could be bad for you.

## 2019-06-10 NOTE — Progress Notes (Signed)
Location:  [facility] of Service:   clinic  Provider: Ema Hebner L. Mariea Clonts, D.O., C.M.D.  Code Status: DNR Goals of Care:  Advanced Directives 06/10/2019  Does Patient Have a Medical Advance Directive? Yes  Type of Advance Directive -  Does patient want to make changes to medical advance directive? No - Patient declined  Copy of [place] in Chart? -  Would patient like information on creating a medical advance directive? -     Chief Complaint  Patient presents with  . Medical Management of Chronic Issues    4 month follow up     HPI: Patient is a 85 y.o. female seen today for medical management of chronic diseases.    Right knee and left shoulder are sore  Has not been to water aerobics for 2 weeks.  Will sometimes do two classes back to back and then cannot get into car.    A friend of hers gave her a medication for her cold symptoms.  She could not get out of bed in the mornings and she was loopy.  Christa does not know what that was.    Past Medical History:  Diagnosis Date  . Aneurysm ([place])   . Arthritis    osteo  . Cystitis 03/02/2014   acute   . Dyslipidemia   . Hearing loss bil   got hearing aides.     Past Surgical History:  Procedure Laterality Date  . ABDOMINAL HYSTERECTOMY    . RADIOLOGY WITH ANESTHESIA N/A 03/04/2014   Procedure: RADIOLOGY WITH ANESTHESIA;  Surgeon: Rob Hickman, MD;  Location: [facility];  Service: Radiology;  Laterality: N/A;  . RADIOLOGY WITH ANESTHESIA N/A 03/04/2014   Procedure: RADIOLOGY WITH ANESTHESIA;  Surgeon: Medication Radiologist, MD;  Location: [facility];  Service: Radiology;  Laterality: N/A;  . VENTRICULOPERITONEAL SHUNT Right 03/15/2014   Procedure: SHUNT INSERTION VENTRICULAR-PERITONEAL;  Surgeon: Charlie Pitter, MD;  Location: [facility];  Service: Neurosurgery;  Laterality: Right;    Allergies  Allergen Reactions  . Aricept [Donepezil Hcl] Diarrhea    Outpatient  Encounter Medications as of 06/10/2019  Medication Sig  . atorvastatin (LIPITOR) 20 MG tablet Take 1 tablet (20 mg total) by mouth daily.  . Calcium Carbonate (CALCIUM 600 PO) Take 1 tablet by mouth daily.   . Cholecalciferol (VITAMIN D) 50 MCG (2000 UT) CAPS Take 1 capsule (2,000 Units total) by mouth daily.  Marland Kitchen denosumab (PROLIA) 60 MG/ML SOLN injection Inject 60 mg into the skin every 6 (six) months. Administer in upper arm, thigh, or abdomen  . Melatonin 5 MG CAPS Take 1 capsule (5 mg total) by mouth at bedtime as needed.  . memantine (NAMENDA) 10 MG tablet Take 1 tablet (10 mg total) by mouth 2 (two) times daily.  Marland Kitchen senna (SENOKOT) 8.6 MG TABS tablet Take 1 tablet (8.6 mg total) by mouth daily as needed for mild constipation.  . sertraline (ZOLOFT) 50 MG tablet Take 1 tablet (50 mg total) by mouth daily.   No facility-administered encounter medications on file as of 06/10/2019.    Review of Systems:  Review of Systems  Constitutional: Negative for chills, fever and malaise/fatigue.  HENT: Positive for hearing loss. Negative for congestion and sore throat.   Eyes: Negative for blurred vision.  Respiratory: Negative for cough and shortness of breath.   Cardiovascular: Negative for chest pain, palpitations and leg swelling.  Gastrointestinal: Negative for abdominal pain, blood in stool, constipation, diarrhea  and melena.  Genitourinary: Positive for urgency. Negative for dysuria.       Some incontinence  Musculoskeletal: Positive for falls and joint pain.  Skin: Negative for itching and rash.  Neurological: Negative for dizziness and loss of consciousness.  Endo/Heme/Allergies: Bruises/bleeds easily.  Psychiatric/Behavioral: Positive for memory loss.       Obsessive behaviors and frontal disinhibition    Health Maintenance  Topic Date Due  . PNA vac Low Risk Adult (2 of 2 - PPSV23) 01/16/2014  . INFLUENZA VACCINE  10/04/2019  . TETANUS/TDAP  01/14/2022  . DEXA SCAN  Completed     Physical Exam: Vitals:   06/10/19 0928  BP: (!) 162/62  Pulse: 93  Temp: 97.9 F (36.6 C)  TempSrc: Temporal  SpO2: 93%  Weight: 143 lb 9.6 oz (65.1 kg)  Height: 5\' 5"  (1.651 m)   Body mass index is 23.9 kg/m. Physical Exam Vitals reviewed.  Constitutional:      General: She is not in acute distress.    Appearance: Normal appearance. She is not ill-appearing.  HENT:     Head: Normocephalic and atraumatic.  Cardiovascular:     Rate and Rhythm: Normal rate and regular rhythm.     Pulses: Normal pulses.     Heart sounds: Normal heart sounds.  Pulmonary:     Effort: Pulmonary effort is normal.     Breath sounds: Normal breath sounds.  Abdominal:     General: Bowel sounds are normal.  Musculoskeletal:        General: Normal range of motion.     Comments: Left shoulder painful with abduction above 90 and flexion above 90 but able to perform and hold, full rom with internal rotation without pain, but negative signs for rotator cuff tears; right knee with crepitus during ROM, but no local tenderness of effusion; has valgus deformity and when walking swings right leg--appears to have leg length discrepancy  Skin:    General: Skin is warm and dry.     Comments: Has not rubbed in the cream on her face  Neurological:     General: No focal deficit present.     Mental Status: She is alert and oriented to person, place, and time.     Cranial Nerves: No cranial nerve deficit.     Sensory: No sensory deficit.     Motor: No weakness.     Coordination: Coordination normal.     Gait: Gait abnormal.  Psychiatric:     Comments: Volunteering to take her top off for her shoulder and heart and lung exam (usual for her), short-term memory loss with repetition of questions for acute visit within minutes     Labs reviewed: Needs a set of labs  Assessment/Plan 1. Left shoulder tendonitis -does not appear to have a tear, likely also some OA component -referred for PT -advised to  attend only one exercise program per day (is wearing herself out) -ok to use tylenol and topical therapies otc  2. Primary osteoarthritis of right knee -PT eval and tx due to pain and antalgic gait/suspect leg length discrepancy contributing, as well, with valgus deformity -may use tylenol, topicals  3. 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 Depression, major, single episode, complete remission ([place]) -spirits good now that she's  able to see her boyfriend again and go to the shop -cont same regimen which is also for OCD behaviors  5. Alcoholism in remission ([place] [place]) -in remission long-term now -contributed to her cognitive decline and prior subarachnoid hemorrhage, also had hydrocephalus   Labs/tests ordered:  I believe I ordered labs at a previous visit but she did not come for them--will check cbc, cmp, flp next week  Next appt:  10/07/2019  Charliegh Vasudevan L. Leshonda Galambos, D.O. [facility] [address], [place] [address] Cell Phone (Mon-Fri 8am-5pm):  [phone] On Call:  [phone] & follow prompts after 5pm & weekends Office Phone:  [phone] Office Fax:  [phone]

## 2019-06-16 ENCOUNTER — Telehealth: Payer: Self-pay | Admitting: *Deleted

## 2019-06-16 NOTE — Telephone Encounter (Signed)
Spoke with Mliss Sax and advised results

## 2019-06-16 NOTE — Telephone Encounter (Signed)
That's fine, but she needs some--it's been too long

## 2019-06-16 NOTE — Telephone Encounter (Signed)
Bernadette independent nurse for [facility] calling stating that pt refused labs today because she's so busy. Mliss Sax asking if it's ok for pt to get labs on Thursday? Pt still might refuse per bernadette.

## 2019-06-18 DIAGNOSIS — I609 Nontraumatic subarachnoid hemorrhage, unspecified: Secondary | ICD-10-CM | POA: Diagnosis not present

## 2019-06-18 DIAGNOSIS — E785 Hyperlipidemia, unspecified: Secondary | ICD-10-CM | POA: Diagnosis not present

## 2019-06-18 LAB — LIPID PANEL
Cholesterol: 193 (ref 0–200)
HDL: 87 — AB (ref 35–70)
LDL Cholesterol: 87
Triglycerides: 97 (ref 40–160)

## 2019-06-18 LAB — CBC AND DIFFERENTIAL
HCT: 44 (ref 36–46)
Hemoglobin: 14.6 (ref 12.0–16.0)
Platelets: 261 (ref 150–399)
WBC: 6.7

## 2019-06-18 LAB — COMPREHENSIVE METABOLIC PANEL
Albumin: 4.4 (ref 3.5–5.0)
Calcium: 9.7 (ref 8.7–10.7)
Globulin: 2.5

## 2019-06-18 LAB — BASIC METABOLIC PANEL
BUN: 22 — AB (ref 4–21)
CO2: 23 — AB (ref 13–22)
Chloride: 103 (ref 99–108)
Creatinine: 1.1 (ref 0.5–1.1)
Glucose: 108
Potassium: 4.4 (ref 3.4–5.3)
Sodium: 141 (ref 137–147)

## 2019-06-18 LAB — CBC: RBC: 4.6 (ref 3.87–5.11)

## 2019-06-18 LAB — HEPATIC FUNCTION PANEL
ALT: 17 (ref 7–35)
AST: 20 (ref 13–35)

## 2019-06-22 ENCOUNTER — Telehealth: Payer: Self-pay | Admitting: *Deleted

## 2019-06-22 NOTE — Telephone Encounter (Signed)
She was referred to [facility] PT at [place].  She may not remember this (she has moderate dementia).  Christa, her friend and caregiver, was present for the appt.

## 2019-06-22 NOTE — Telephone Encounter (Signed)
Patient called and stated that she spoke with you and you were going to refer her to someone regarding her aches and pains. Stated that she didn't know who or where but she has not heard from anyone.  Patient was very short and in a hurry.  Please Advise.

## 2019-06-22 NOTE — Telephone Encounter (Signed)
Patient notified and agreed.  

## 2019-06-23 ENCOUNTER — Encounter: Payer: Self-pay | Admitting: Internal Medicine

## 2019-06-23 DIAGNOSIS — M25561 Pain in right knee: Secondary | ICD-10-CM | POA: Diagnosis not present

## 2019-06-23 DIAGNOSIS — M810 Age-related osteoporosis without current pathological fracture: Secondary | ICD-10-CM | POA: Diagnosis not present

## 2019-06-23 DIAGNOSIS — M778 Other enthesopathies, not elsewhere classified: Secondary | ICD-10-CM | POA: Diagnosis not present

## 2019-06-23 DIAGNOSIS — R4189 Other symptoms and signs involving cognitive functions and awareness: Secondary | ICD-10-CM | POA: Diagnosis not present

## 2019-06-23 DIAGNOSIS — M6389 Disorders of muscle in diseases classified elsewhere, multiple sites: Secondary | ICD-10-CM | POA: Diagnosis not present

## 2019-06-23 DIAGNOSIS — F0281 Dementia in other diseases classified elsewhere with behavioral disturbance: Secondary | ICD-10-CM | POA: Diagnosis not present

## 2019-06-23 DIAGNOSIS — M25512 Pain in left shoulder: Secondary | ICD-10-CM | POA: Diagnosis not present

## 2019-06-23 DIAGNOSIS — M6789 Other specified disorders of synovium and tendon, multiple sites: Secondary | ICD-10-CM | POA: Diagnosis not present

## 2019-06-23 DIAGNOSIS — R278 Other lack of coordination: Secondary | ICD-10-CM | POA: Diagnosis not present

## 2019-06-23 DIAGNOSIS — R2689 Other abnormalities of gait and mobility: Secondary | ICD-10-CM | POA: Diagnosis not present

## 2019-06-23 DIAGNOSIS — M1711 Unilateral primary osteoarthritis, right knee: Secondary | ICD-10-CM | POA: Diagnosis not present

## 2019-06-23 DIAGNOSIS — F422 Mixed obsessional thoughts and acts: Secondary | ICD-10-CM | POA: Diagnosis not present

## 2019-06-23 DIAGNOSIS — G910 Communicating hydrocephalus: Secondary | ICD-10-CM | POA: Diagnosis not present

## 2019-06-23 DIAGNOSIS — M25511 Pain in right shoulder: Secondary | ICD-10-CM | POA: Diagnosis not present

## 2019-06-23 DIAGNOSIS — G3109 Other frontotemporal dementia: Secondary | ICD-10-CM | POA: Diagnosis not present

## 2019-06-24 DIAGNOSIS — M1711 Unilateral primary osteoarthritis, right knee: Secondary | ICD-10-CM | POA: Diagnosis not present

## 2019-06-24 DIAGNOSIS — M6389 Disorders of muscle in diseases classified elsewhere, multiple sites: Secondary | ICD-10-CM | POA: Diagnosis not present

## 2019-06-24 DIAGNOSIS — R4189 Other symptoms and signs involving cognitive functions and awareness: Secondary | ICD-10-CM | POA: Diagnosis not present

## 2019-06-24 DIAGNOSIS — R278 Other lack of coordination: Secondary | ICD-10-CM | POA: Diagnosis not present

## 2019-06-24 DIAGNOSIS — R2689 Other abnormalities of gait and mobility: Secondary | ICD-10-CM | POA: Diagnosis not present

## 2019-06-24 DIAGNOSIS — M25561 Pain in right knee: Secondary | ICD-10-CM | POA: Diagnosis not present

## 2019-06-25 DIAGNOSIS — R4189 Other symptoms and signs involving cognitive functions and awareness: Secondary | ICD-10-CM | POA: Diagnosis not present

## 2019-06-25 DIAGNOSIS — M6389 Disorders of muscle in diseases classified elsewhere, multiple sites: Secondary | ICD-10-CM | POA: Diagnosis not present

## 2019-06-25 DIAGNOSIS — R2689 Other abnormalities of gait and mobility: Secondary | ICD-10-CM | POA: Diagnosis not present

## 2019-06-25 DIAGNOSIS — M25561 Pain in right knee: Secondary | ICD-10-CM | POA: Diagnosis not present

## 2019-06-25 DIAGNOSIS — R278 Other lack of coordination: Secondary | ICD-10-CM | POA: Diagnosis not present

## 2019-06-25 DIAGNOSIS — M1711 Unilateral primary osteoarthritis, right knee: Secondary | ICD-10-CM | POA: Diagnosis not present

## 2019-06-26 DIAGNOSIS — R278 Other lack of coordination: Secondary | ICD-10-CM | POA: Diagnosis not present

## 2019-06-26 DIAGNOSIS — R4189 Other symptoms and signs involving cognitive functions and awareness: Secondary | ICD-10-CM | POA: Diagnosis not present

## 2019-06-26 DIAGNOSIS — M1711 Unilateral primary osteoarthritis, right knee: Secondary | ICD-10-CM | POA: Diagnosis not present

## 2019-06-26 DIAGNOSIS — M6389 Disorders of muscle in diseases classified elsewhere, multiple sites: Secondary | ICD-10-CM | POA: Diagnosis not present

## 2019-06-26 DIAGNOSIS — R2689 Other abnormalities of gait and mobility: Secondary | ICD-10-CM | POA: Diagnosis not present

## 2019-06-26 DIAGNOSIS — M25561 Pain in right knee: Secondary | ICD-10-CM | POA: Diagnosis not present

## 2019-06-29 DIAGNOSIS — R2689 Other abnormalities of gait and mobility: Secondary | ICD-10-CM | POA: Diagnosis not present

## 2019-06-29 DIAGNOSIS — M25561 Pain in right knee: Secondary | ICD-10-CM | POA: Diagnosis not present

## 2019-06-29 DIAGNOSIS — R4189 Other symptoms and signs involving cognitive functions and awareness: Secondary | ICD-10-CM | POA: Diagnosis not present

## 2019-06-29 DIAGNOSIS — M6389 Disorders of muscle in diseases classified elsewhere, multiple sites: Secondary | ICD-10-CM | POA: Diagnosis not present

## 2019-06-29 DIAGNOSIS — M1711 Unilateral primary osteoarthritis, right knee: Secondary | ICD-10-CM | POA: Diagnosis not present

## 2019-06-29 DIAGNOSIS — R278 Other lack of coordination: Secondary | ICD-10-CM | POA: Diagnosis not present

## 2019-07-01 DIAGNOSIS — R278 Other lack of coordination: Secondary | ICD-10-CM | POA: Diagnosis not present

## 2019-07-01 DIAGNOSIS — M25561 Pain in right knee: Secondary | ICD-10-CM | POA: Diagnosis not present

## 2019-07-01 DIAGNOSIS — M1711 Unilateral primary osteoarthritis, right knee: Secondary | ICD-10-CM | POA: Diagnosis not present

## 2019-07-01 DIAGNOSIS — R2689 Other abnormalities of gait and mobility: Secondary | ICD-10-CM | POA: Diagnosis not present

## 2019-07-01 DIAGNOSIS — R4189 Other symptoms and signs involving cognitive functions and awareness: Secondary | ICD-10-CM | POA: Diagnosis not present

## 2019-07-01 DIAGNOSIS — M6389 Disorders of muscle in diseases classified elsewhere, multiple sites: Secondary | ICD-10-CM | POA: Diagnosis not present

## 2019-07-02 DIAGNOSIS — M6389 Disorders of muscle in diseases classified elsewhere, multiple sites: Secondary | ICD-10-CM | POA: Diagnosis not present

## 2019-07-02 DIAGNOSIS — M1711 Unilateral primary osteoarthritis, right knee: Secondary | ICD-10-CM | POA: Diagnosis not present

## 2019-07-02 DIAGNOSIS — R4189 Other symptoms and signs involving cognitive functions and awareness: Secondary | ICD-10-CM | POA: Diagnosis not present

## 2019-07-02 DIAGNOSIS — R2689 Other abnormalities of gait and mobility: Secondary | ICD-10-CM | POA: Diagnosis not present

## 2019-07-02 DIAGNOSIS — R278 Other lack of coordination: Secondary | ICD-10-CM | POA: Diagnosis not present

## 2019-07-02 DIAGNOSIS — M25561 Pain in right knee: Secondary | ICD-10-CM | POA: Diagnosis not present

## 2019-07-06 DIAGNOSIS — F422 Mixed obsessional thoughts and acts: Secondary | ICD-10-CM | POA: Diagnosis not present

## 2019-07-06 DIAGNOSIS — M6789 Other specified disorders of synovium and tendon, multiple sites: Secondary | ICD-10-CM | POA: Diagnosis not present

## 2019-07-06 DIAGNOSIS — M810 Age-related osteoporosis without current pathological fracture: Secondary | ICD-10-CM | POA: Diagnosis not present

## 2019-07-06 DIAGNOSIS — R278 Other lack of coordination: Secondary | ICD-10-CM | POA: Diagnosis not present

## 2019-07-06 DIAGNOSIS — M6389 Disorders of muscle in diseases classified elsewhere, multiple sites: Secondary | ICD-10-CM | POA: Diagnosis not present

## 2019-07-06 DIAGNOSIS — M1711 Unilateral primary osteoarthritis, right knee: Secondary | ICD-10-CM | POA: Diagnosis not present

## 2019-07-06 DIAGNOSIS — M778 Other enthesopathies, not elsewhere classified: Secondary | ICD-10-CM | POA: Diagnosis not present

## 2019-07-06 DIAGNOSIS — M25512 Pain in left shoulder: Secondary | ICD-10-CM | POA: Diagnosis not present

## 2019-07-06 DIAGNOSIS — M25561 Pain in right knee: Secondary | ICD-10-CM | POA: Diagnosis not present

## 2019-07-06 DIAGNOSIS — G3109 Other frontotemporal dementia: Secondary | ICD-10-CM | POA: Diagnosis not present

## 2019-07-06 DIAGNOSIS — G910 Communicating hydrocephalus: Secondary | ICD-10-CM | POA: Diagnosis not present

## 2019-07-06 DIAGNOSIS — M25511 Pain in right shoulder: Secondary | ICD-10-CM | POA: Diagnosis not present

## 2019-07-06 DIAGNOSIS — R4189 Other symptoms and signs involving cognitive functions and awareness: Secondary | ICD-10-CM | POA: Diagnosis not present

## 2019-07-06 DIAGNOSIS — R2689 Other abnormalities of gait and mobility: Secondary | ICD-10-CM | POA: Diagnosis not present

## 2019-07-06 DIAGNOSIS — F0281 Dementia in other diseases classified elsewhere with behavioral disturbance: Secondary | ICD-10-CM | POA: Diagnosis not present

## 2019-07-08 ENCOUNTER — Encounter: Payer: Self-pay | Admitting: Internal Medicine

## 2019-07-08 DIAGNOSIS — M6389 Disorders of muscle in diseases classified elsewhere, multiple sites: Secondary | ICD-10-CM | POA: Diagnosis not present

## 2019-07-08 DIAGNOSIS — R4189 Other symptoms and signs involving cognitive functions and awareness: Secondary | ICD-10-CM | POA: Diagnosis not present

## 2019-07-08 DIAGNOSIS — M1711 Unilateral primary osteoarthritis, right knee: Secondary | ICD-10-CM | POA: Diagnosis not present

## 2019-07-08 DIAGNOSIS — M25561 Pain in right knee: Secondary | ICD-10-CM | POA: Diagnosis not present

## 2019-07-08 DIAGNOSIS — R2689 Other abnormalities of gait and mobility: Secondary | ICD-10-CM | POA: Diagnosis not present

## 2019-07-08 DIAGNOSIS — R278 Other lack of coordination: Secondary | ICD-10-CM | POA: Diagnosis not present

## 2019-07-09 DIAGNOSIS — R2689 Other abnormalities of gait and mobility: Secondary | ICD-10-CM | POA: Diagnosis not present

## 2019-07-09 DIAGNOSIS — R278 Other lack of coordination: Secondary | ICD-10-CM | POA: Diagnosis not present

## 2019-07-09 DIAGNOSIS — M6389 Disorders of muscle in diseases classified elsewhere, multiple sites: Secondary | ICD-10-CM | POA: Diagnosis not present

## 2019-07-09 DIAGNOSIS — M1711 Unilateral primary osteoarthritis, right knee: Secondary | ICD-10-CM | POA: Diagnosis not present

## 2019-07-09 DIAGNOSIS — R4189 Other symptoms and signs involving cognitive functions and awareness: Secondary | ICD-10-CM | POA: Diagnosis not present

## 2019-07-09 DIAGNOSIS — M25561 Pain in right knee: Secondary | ICD-10-CM | POA: Diagnosis not present

## 2019-07-13 DIAGNOSIS — M25561 Pain in right knee: Secondary | ICD-10-CM | POA: Diagnosis not present

## 2019-07-13 DIAGNOSIS — R278 Other lack of coordination: Secondary | ICD-10-CM | POA: Diagnosis not present

## 2019-07-13 DIAGNOSIS — R4189 Other symptoms and signs involving cognitive functions and awareness: Secondary | ICD-10-CM | POA: Diagnosis not present

## 2019-07-13 DIAGNOSIS — R2689 Other abnormalities of gait and mobility: Secondary | ICD-10-CM | POA: Diagnosis not present

## 2019-07-13 DIAGNOSIS — M1711 Unilateral primary osteoarthritis, right knee: Secondary | ICD-10-CM | POA: Diagnosis not present

## 2019-07-13 DIAGNOSIS — M6389 Disorders of muscle in diseases classified elsewhere, multiple sites: Secondary | ICD-10-CM | POA: Diagnosis not present

## 2019-07-14 DIAGNOSIS — R2689 Other abnormalities of gait and mobility: Secondary | ICD-10-CM | POA: Diagnosis not present

## 2019-07-14 DIAGNOSIS — R278 Other lack of coordination: Secondary | ICD-10-CM | POA: Diagnosis not present

## 2019-07-14 DIAGNOSIS — M1711 Unilateral primary osteoarthritis, right knee: Secondary | ICD-10-CM | POA: Diagnosis not present

## 2019-07-14 DIAGNOSIS — M6389 Disorders of muscle in diseases classified elsewhere, multiple sites: Secondary | ICD-10-CM | POA: Diagnosis not present

## 2019-07-14 DIAGNOSIS — R4189 Other symptoms and signs involving cognitive functions and awareness: Secondary | ICD-10-CM | POA: Diagnosis not present

## 2019-07-14 DIAGNOSIS — M25561 Pain in right knee: Secondary | ICD-10-CM | POA: Diagnosis not present

## 2019-07-15 DIAGNOSIS — R4189 Other symptoms and signs involving cognitive functions and awareness: Secondary | ICD-10-CM | POA: Diagnosis not present

## 2019-07-15 DIAGNOSIS — M6389 Disorders of muscle in diseases classified elsewhere, multiple sites: Secondary | ICD-10-CM | POA: Diagnosis not present

## 2019-07-15 DIAGNOSIS — R278 Other lack of coordination: Secondary | ICD-10-CM | POA: Diagnosis not present

## 2019-07-15 DIAGNOSIS — M25561 Pain in right knee: Secondary | ICD-10-CM | POA: Diagnosis not present

## 2019-07-15 DIAGNOSIS — M1711 Unilateral primary osteoarthritis, right knee: Secondary | ICD-10-CM | POA: Diagnosis not present

## 2019-07-15 DIAGNOSIS — R2689 Other abnormalities of gait and mobility: Secondary | ICD-10-CM | POA: Diagnosis not present

## 2019-07-16 DIAGNOSIS — R278 Other lack of coordination: Secondary | ICD-10-CM | POA: Diagnosis not present

## 2019-07-16 DIAGNOSIS — M6389 Disorders of muscle in diseases classified elsewhere, multiple sites: Secondary | ICD-10-CM | POA: Diagnosis not present

## 2019-07-16 DIAGNOSIS — R2689 Other abnormalities of gait and mobility: Secondary | ICD-10-CM | POA: Diagnosis not present

## 2019-07-16 DIAGNOSIS — M1711 Unilateral primary osteoarthritis, right knee: Secondary | ICD-10-CM | POA: Diagnosis not present

## 2019-07-16 DIAGNOSIS — R4189 Other symptoms and signs involving cognitive functions and awareness: Secondary | ICD-10-CM | POA: Diagnosis not present

## 2019-07-16 DIAGNOSIS — M25561 Pain in right knee: Secondary | ICD-10-CM | POA: Diagnosis not present

## 2019-07-20 DIAGNOSIS — M1711 Unilateral primary osteoarthritis, right knee: Secondary | ICD-10-CM | POA: Diagnosis not present

## 2019-07-20 DIAGNOSIS — R4189 Other symptoms and signs involving cognitive functions and awareness: Secondary | ICD-10-CM | POA: Diagnosis not present

## 2019-07-20 DIAGNOSIS — R278 Other lack of coordination: Secondary | ICD-10-CM | POA: Diagnosis not present

## 2019-07-20 DIAGNOSIS — M6389 Disorders of muscle in diseases classified elsewhere, multiple sites: Secondary | ICD-10-CM | POA: Diagnosis not present

## 2019-07-20 DIAGNOSIS — M25561 Pain in right knee: Secondary | ICD-10-CM | POA: Diagnosis not present

## 2019-07-20 DIAGNOSIS — R2689 Other abnormalities of gait and mobility: Secondary | ICD-10-CM | POA: Diagnosis not present

## 2019-07-21 DIAGNOSIS — M1711 Unilateral primary osteoarthritis, right knee: Secondary | ICD-10-CM | POA: Diagnosis not present

## 2019-07-21 DIAGNOSIS — M6389 Disorders of muscle in diseases classified elsewhere, multiple sites: Secondary | ICD-10-CM | POA: Diagnosis not present

## 2019-07-21 DIAGNOSIS — R2689 Other abnormalities of gait and mobility: Secondary | ICD-10-CM | POA: Diagnosis not present

## 2019-07-21 DIAGNOSIS — R4189 Other symptoms and signs involving cognitive functions and awareness: Secondary | ICD-10-CM | POA: Diagnosis not present

## 2019-07-21 DIAGNOSIS — R278 Other lack of coordination: Secondary | ICD-10-CM | POA: Diagnosis not present

## 2019-07-21 DIAGNOSIS — M25561 Pain in right knee: Secondary | ICD-10-CM | POA: Diagnosis not present

## 2019-07-22 DIAGNOSIS — M6389 Disorders of muscle in diseases classified elsewhere, multiple sites: Secondary | ICD-10-CM | POA: Diagnosis not present

## 2019-07-22 DIAGNOSIS — R278 Other lack of coordination: Secondary | ICD-10-CM | POA: Diagnosis not present

## 2019-07-22 DIAGNOSIS — M1711 Unilateral primary osteoarthritis, right knee: Secondary | ICD-10-CM | POA: Diagnosis not present

## 2019-07-22 DIAGNOSIS — R2689 Other abnormalities of gait and mobility: Secondary | ICD-10-CM | POA: Diagnosis not present

## 2019-07-22 DIAGNOSIS — R4189 Other symptoms and signs involving cognitive functions and awareness: Secondary | ICD-10-CM | POA: Diagnosis not present

## 2019-07-22 DIAGNOSIS — M25561 Pain in right knee: Secondary | ICD-10-CM | POA: Diagnosis not present

## 2019-07-28 DIAGNOSIS — M25561 Pain in right knee: Secondary | ICD-10-CM | POA: Diagnosis not present

## 2019-07-28 DIAGNOSIS — M1711 Unilateral primary osteoarthritis, right knee: Secondary | ICD-10-CM | POA: Diagnosis not present

## 2019-07-28 DIAGNOSIS — M6389 Disorders of muscle in diseases classified elsewhere, multiple sites: Secondary | ICD-10-CM | POA: Diagnosis not present

## 2019-07-28 DIAGNOSIS — R4189 Other symptoms and signs involving cognitive functions and awareness: Secondary | ICD-10-CM | POA: Diagnosis not present

## 2019-07-28 DIAGNOSIS — R278 Other lack of coordination: Secondary | ICD-10-CM | POA: Diagnosis not present

## 2019-07-28 DIAGNOSIS — R2689 Other abnormalities of gait and mobility: Secondary | ICD-10-CM | POA: Diagnosis not present

## 2019-07-29 ENCOUNTER — Encounter: Payer: Self-pay | Admitting: Internal Medicine

## 2019-07-29 DIAGNOSIS — M25561 Pain in right knee: Secondary | ICD-10-CM | POA: Diagnosis not present

## 2019-07-29 DIAGNOSIS — R2689 Other abnormalities of gait and mobility: Secondary | ICD-10-CM | POA: Diagnosis not present

## 2019-07-29 DIAGNOSIS — F02818 Dementia in other diseases classified elsewhere, unspecified severity, with other behavioral disturbance: Secondary | ICD-10-CM

## 2019-07-29 DIAGNOSIS — R278 Other lack of coordination: Secondary | ICD-10-CM | POA: Diagnosis not present

## 2019-07-29 DIAGNOSIS — M1711 Unilateral primary osteoarthritis, right knee: Secondary | ICD-10-CM | POA: Diagnosis not present

## 2019-07-29 DIAGNOSIS — M6389 Disorders of muscle in diseases classified elsewhere, multiple sites: Secondary | ICD-10-CM | POA: Diagnosis not present

## 2019-07-29 DIAGNOSIS — F422 Mixed obsessional thoughts and acts: Secondary | ICD-10-CM

## 2019-07-29 DIAGNOSIS — R4189 Other symptoms and signs involving cognitive functions and awareness: Secondary | ICD-10-CM | POA: Diagnosis not present

## 2019-07-30 DIAGNOSIS — M6389 Disorders of muscle in diseases classified elsewhere, multiple sites: Secondary | ICD-10-CM | POA: Diagnosis not present

## 2019-07-30 DIAGNOSIS — M1711 Unilateral primary osteoarthritis, right knee: Secondary | ICD-10-CM | POA: Diagnosis not present

## 2019-07-30 DIAGNOSIS — R2689 Other abnormalities of gait and mobility: Secondary | ICD-10-CM | POA: Diagnosis not present

## 2019-07-30 DIAGNOSIS — M25561 Pain in right knee: Secondary | ICD-10-CM | POA: Diagnosis not present

## 2019-07-30 DIAGNOSIS — R4189 Other symptoms and signs involving cognitive functions and awareness: Secondary | ICD-10-CM | POA: Diagnosis not present

## 2019-07-30 DIAGNOSIS — R278 Other lack of coordination: Secondary | ICD-10-CM | POA: Diagnosis not present

## 2019-07-30 MED ORDER — MEMANTINE HCL 10 MG PO TABS: 10.0000 mg | ORAL_TABLET | Freq: Two times a day (BID) | ORAL | 1 refills | Status: DC

## 2019-07-30 MED ORDER — ATORVASTATIN CALCIUM 20 MG PO TABS: 20.0000 mg | ORAL_TABLET | Freq: Every day | ORAL | 1 refills | Status: DC

## 2019-07-30 MED ORDER — SERTRALINE HCL 50 MG PO TABS: 50.0000 mg | ORAL_TABLET | Freq: Every day | ORAL | 1 refills | Status: DC

## 2019-07-30 NOTE — Telephone Encounter (Signed)
Pended Rx's and sent to Dr. Mariea Clonts for approval.

## 2019-08-07 ENCOUNTER — Other Ambulatory Visit: Payer: Self-pay | Admitting: *Deleted

## 2019-08-07 DIAGNOSIS — F422 Mixed obsessional thoughts and acts: Secondary | ICD-10-CM

## 2019-08-07 DIAGNOSIS — F02818 Dementia in other diseases classified elsewhere, unspecified severity, with other behavioral disturbance: Secondary | ICD-10-CM

## 2019-08-07 MED ORDER — MEMANTINE HCL 10 MG PO TABS: 10.0000 mg | ORAL_TABLET | Freq: Two times a day (BID) | ORAL | 1 refills | Status: DC

## 2019-08-07 MED ORDER — ATORVASTATIN CALCIUM 20 MG PO TABS: 20.0000 mg | ORAL_TABLET | Freq: Every day | ORAL | 1 refills | Status: DC

## 2019-08-07 MED ORDER — SERTRALINE HCL 50 MG PO TABS: 50.0000 mg | ORAL_TABLET | Freq: Every day | ORAL | 1 refills | Status: DC

## 2019-08-07 NOTE — Telephone Encounter (Signed)
Received refill Request from [employer] 

## 2019-08-19 DIAGNOSIS — L570 Actinic keratosis: Secondary | ICD-10-CM | POA: Diagnosis not present

## 2019-08-19 DIAGNOSIS — L578 Other skin changes due to chronic exposure to nonionizing radiation: Secondary | ICD-10-CM | POA: Diagnosis not present

## 2019-08-19 DIAGNOSIS — L814 Other melanin hyperpigmentation: Secondary | ICD-10-CM | POA: Diagnosis not present

## 2019-08-19 DIAGNOSIS — L821 Other seborrheic keratosis: Secondary | ICD-10-CM | POA: Diagnosis not present

## 2019-08-19 DIAGNOSIS — L748 Other eccrine sweat disorders: Secondary | ICD-10-CM | POA: Diagnosis not present

## 2019-08-19 DIAGNOSIS — L738 Other specified follicular disorders: Secondary | ICD-10-CM | POA: Diagnosis not present

## 2019-09-03 DIAGNOSIS — M25511 Pain in right shoulder: Secondary | ICD-10-CM | POA: Diagnosis not present

## 2019-09-03 DIAGNOSIS — M6389 Disorders of muscle in diseases classified elsewhere, multiple sites: Secondary | ICD-10-CM | POA: Diagnosis not present

## 2019-09-03 DIAGNOSIS — G3109 Other frontotemporal dementia: Secondary | ICD-10-CM | POA: Diagnosis not present

## 2019-09-03 DIAGNOSIS — F0281 Dementia in other diseases classified elsewhere with behavioral disturbance: Secondary | ICD-10-CM | POA: Diagnosis not present

## 2019-09-03 DIAGNOSIS — R4189 Other symptoms and signs involving cognitive functions and awareness: Secondary | ICD-10-CM | POA: Diagnosis not present

## 2019-09-03 DIAGNOSIS — R278 Other lack of coordination: Secondary | ICD-10-CM | POA: Diagnosis not present

## 2019-09-03 DIAGNOSIS — M25512 Pain in left shoulder: Secondary | ICD-10-CM | POA: Diagnosis not present

## 2019-09-03 DIAGNOSIS — M6789 Other specified disorders of synovium and tendon, multiple sites: Secondary | ICD-10-CM | POA: Diagnosis not present

## 2019-09-03 DIAGNOSIS — M778 Other enthesopathies, not elsewhere classified: Secondary | ICD-10-CM | POA: Diagnosis not present

## 2019-09-08 DIAGNOSIS — M6389 Disorders of muscle in diseases classified elsewhere, multiple sites: Secondary | ICD-10-CM | POA: Diagnosis not present

## 2019-09-08 DIAGNOSIS — R4189 Other symptoms and signs involving cognitive functions and awareness: Secondary | ICD-10-CM | POA: Diagnosis not present

## 2019-09-08 DIAGNOSIS — M25512 Pain in left shoulder: Secondary | ICD-10-CM | POA: Diagnosis not present

## 2019-09-08 DIAGNOSIS — M25511 Pain in right shoulder: Secondary | ICD-10-CM | POA: Diagnosis not present

## 2019-09-08 DIAGNOSIS — M778 Other enthesopathies, not elsewhere classified: Secondary | ICD-10-CM | POA: Diagnosis not present

## 2019-09-08 DIAGNOSIS — R278 Other lack of coordination: Secondary | ICD-10-CM | POA: Diagnosis not present

## 2019-09-10 DIAGNOSIS — M25511 Pain in right shoulder: Secondary | ICD-10-CM | POA: Diagnosis not present

## 2019-09-10 DIAGNOSIS — M25512 Pain in left shoulder: Secondary | ICD-10-CM | POA: Diagnosis not present

## 2019-09-10 DIAGNOSIS — M6389 Disorders of muscle in diseases classified elsewhere, multiple sites: Secondary | ICD-10-CM | POA: Diagnosis not present

## 2019-09-10 DIAGNOSIS — R4189 Other symptoms and signs involving cognitive functions and awareness: Secondary | ICD-10-CM | POA: Diagnosis not present

## 2019-09-10 DIAGNOSIS — R278 Other lack of coordination: Secondary | ICD-10-CM | POA: Diagnosis not present

## 2019-09-10 DIAGNOSIS — M778 Other enthesopathies, not elsewhere classified: Secondary | ICD-10-CM | POA: Diagnosis not present

## 2019-09-17 DIAGNOSIS — M778 Other enthesopathies, not elsewhere classified: Secondary | ICD-10-CM | POA: Diagnosis not present

## 2019-09-17 DIAGNOSIS — M25511 Pain in right shoulder: Secondary | ICD-10-CM | POA: Diagnosis not present

## 2019-09-17 DIAGNOSIS — R4189 Other symptoms and signs involving cognitive functions and awareness: Secondary | ICD-10-CM | POA: Diagnosis not present

## 2019-09-17 DIAGNOSIS — M6389 Disorders of muscle in diseases classified elsewhere, multiple sites: Secondary | ICD-10-CM | POA: Diagnosis not present

## 2019-09-17 DIAGNOSIS — R278 Other lack of coordination: Secondary | ICD-10-CM | POA: Diagnosis not present

## 2019-09-17 DIAGNOSIS — M25512 Pain in left shoulder: Secondary | ICD-10-CM | POA: Diagnosis not present

## 2019-09-22 DIAGNOSIS — R278 Other lack of coordination: Secondary | ICD-10-CM | POA: Diagnosis not present

## 2019-09-22 DIAGNOSIS — R4189 Other symptoms and signs involving cognitive functions and awareness: Secondary | ICD-10-CM | POA: Diagnosis not present

## 2019-09-22 DIAGNOSIS — M778 Other enthesopathies, not elsewhere classified: Secondary | ICD-10-CM | POA: Diagnosis not present

## 2019-09-22 DIAGNOSIS — M25512 Pain in left shoulder: Secondary | ICD-10-CM | POA: Diagnosis not present

## 2019-09-22 DIAGNOSIS — M6389 Disorders of muscle in diseases classified elsewhere, multiple sites: Secondary | ICD-10-CM | POA: Diagnosis not present

## 2019-09-22 DIAGNOSIS — M25511 Pain in right shoulder: Secondary | ICD-10-CM | POA: Diagnosis not present

## 2019-09-23 DIAGNOSIS — M25511 Pain in right shoulder: Secondary | ICD-10-CM | POA: Diagnosis not present

## 2019-09-23 DIAGNOSIS — R4189 Other symptoms and signs involving cognitive functions and awareness: Secondary | ICD-10-CM | POA: Diagnosis not present

## 2019-09-23 DIAGNOSIS — M6389 Disorders of muscle in diseases classified elsewhere, multiple sites: Secondary | ICD-10-CM | POA: Diagnosis not present

## 2019-09-23 DIAGNOSIS — R278 Other lack of coordination: Secondary | ICD-10-CM | POA: Diagnosis not present

## 2019-09-23 DIAGNOSIS — M778 Other enthesopathies, not elsewhere classified: Secondary | ICD-10-CM | POA: Diagnosis not present

## 2019-09-23 DIAGNOSIS — M25512 Pain in left shoulder: Secondary | ICD-10-CM | POA: Diagnosis not present

## 2019-10-01 ENCOUNTER — Telehealth: Payer: Self-pay

## 2019-10-01 NOTE — Telephone Encounter (Signed)
Spoke with [employer].  We agreed that Sharon [name] does not really get great benefit from counseling sessions with her degree of short-term memory loss from frontotemporal dementia.

## 2019-10-01 NOTE — Telephone Encounter (Signed)
[facility] wanted to know if Ms. Sharon [name] is mentally capable of receiving counseling. She state that the release form was signed in order to discuss her medical history with her. She has concerns about her memory please advise? Cell [phone]

## 2019-10-07 ENCOUNTER — Encounter: Payer: Self-pay | Admitting: Internal Medicine

## 2019-10-07 ENCOUNTER — Non-Acute Institutional Stay: Payer: Medicare Other | Admitting: Internal Medicine

## 2019-10-07 ENCOUNTER — Other Ambulatory Visit: Payer: Self-pay

## 2019-10-07 VITALS — BP 122/82 | HR 83 | Temp 97.7°F | Ht 65.0 in | Wt 141.0 lb

## 2019-10-07 DIAGNOSIS — L089 Local infection of the skin and subcutaneous tissue, unspecified: Secondary | ICD-10-CM | POA: Diagnosis not present

## 2019-10-07 DIAGNOSIS — M1711 Unilateral primary osteoarthritis, right knee: Secondary | ICD-10-CM

## 2019-10-07 DIAGNOSIS — F422 Mixed obsessional thoughts and acts: Secondary | ICD-10-CM | POA: Diagnosis not present

## 2019-10-07 DIAGNOSIS — M778 Other enthesopathies, not elsewhere classified: Secondary | ICD-10-CM

## 2019-10-07 DIAGNOSIS — M25512 Pain in left shoulder: Secondary | ICD-10-CM | POA: Diagnosis not present

## 2019-10-07 DIAGNOSIS — S90812A Abrasion, left foot, initial encounter: Secondary | ICD-10-CM | POA: Diagnosis not present

## 2019-10-07 DIAGNOSIS — R4189 Other symptoms and signs involving cognitive functions and awareness: Secondary | ICD-10-CM | POA: Diagnosis not present

## 2019-10-07 DIAGNOSIS — F02818 Dementia in other diseases classified elsewhere, unspecified severity, with other behavioral disturbance: Secondary | ICD-10-CM

## 2019-10-07 DIAGNOSIS — G3109 Other frontotemporal dementia: Secondary | ICD-10-CM | POA: Diagnosis not present

## 2019-10-07 DIAGNOSIS — M6789 Other specified disorders of synovium and tendon, multiple sites: Secondary | ICD-10-CM | POA: Diagnosis not present

## 2019-10-07 DIAGNOSIS — F0281 Dementia in other diseases classified elsewhere with behavioral disturbance: Secondary | ICD-10-CM | POA: Diagnosis not present

## 2019-10-07 DIAGNOSIS — R278 Other lack of coordination: Secondary | ICD-10-CM | POA: Diagnosis not present

## 2019-10-07 DIAGNOSIS — F17200 Nicotine dependence, unspecified, uncomplicated: Secondary | ICD-10-CM | POA: Diagnosis not present

## 2019-10-07 DIAGNOSIS — IMO0001 Reserved for inherently not codable concepts without codable children: Secondary | ICD-10-CM

## 2019-10-07 DIAGNOSIS — M25511 Pain in right shoulder: Secondary | ICD-10-CM | POA: Diagnosis not present

## 2019-10-07 DIAGNOSIS — M6389 Disorders of muscle in diseases classified elsewhere, multiple sites: Secondary | ICD-10-CM | POA: Diagnosis not present

## 2019-10-07 DIAGNOSIS — M7582 Other shoulder lesions, left shoulder: Secondary | ICD-10-CM

## 2019-10-07 NOTE — Progress Notes (Signed)
Location:  Occupational psychologist of Service:  Clinic (12)  Provider: Alba Perillo L. Mariea Clonts, D.O., C.M.D.  Code Status: DNR Goals of Care:  Advanced Directives 10/07/2019  Does Patient Have a Medical Advance Directive? Yes  Type of Advance Directive Out of facility DNR (pink MOST or yellow form)  Does patient want to make changes to medical advance directive? No - Patient declined  Copy of [place] in Chart? -  Would patient like information on creating a medical advance directive? -  Pre-existing out of facility DNR order (yellow form or pink MOST form) Pink MOST/Yellow Form most recent copy in chart - Physician notified to receive inpatient order     Chief Complaint  Patient presents with  . Medical Management of Chronic Issues    4 month follow up   . Acute Visit    Spot on bottom of foot - step on dog bone     HPI: Patient is a 85 y.o. female seen today for medical management of chronic diseases.    Stepped on a dog bone when family visited.  Has a pencil eraser sized open area on plantar aspect of foot.  Mildly tender.  She's been putting "cream" on it and a bandaid during the day and off at night to sleep.  Shoes appear dirty inside.    She also has aches and pains in her shoulders and knees.  Is working with OT.  She had stopped using tylenol or voltaren on her knees and they hurt again.  She was getting some counseling with [facility] psychologist; however, pt thinks she needs to ask her questions but pt does not share anything on her own with the therapist.  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  She is now home all of the time if Christa her caregiver is not with her or her boyfriend is not in town (comes from [place] every other weekend).    She has picked up smoking recently which I extensively counseled against her doing.  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    Past Medical History:  Diagnosis Date  . Aneurysm ([place])   . Arthritis    osteo  . Cystitis 03/02/2014   acute   . Dyslipidemia   . Hearing loss bil   got hearing aides.     Past Surgical History:  Procedure Laterality Date  . ABDOMINAL HYSTERECTOMY    . RADIOLOGY WITH ANESTHESIA N/A 03/04/2014   Procedure: RADIOLOGY WITH ANESTHESIA;  Surgeon: Rob Hickman, MD;  Location: [place];  Service: Radiology;  Laterality: N/A;  . RADIOLOGY WITH ANESTHESIA N/A 03/04/2014   Procedure: RADIOLOGY WITH ANESTHESIA;  Surgeon: Medication Radiologist, MD;  Location: [place];  Service: Radiology;  Laterality: N/A;  . VENTRICULOPERITONEAL SHUNT Right 03/15/2014   Procedure: SHUNT INSERTION VENTRICULAR-PERITONEAL;  Surgeon: Charlie Pitter, MD;  [facility];  Service: Neurosurgery;  Laterality: Right;    Allergies  Allergen Reactions  . Aricept [Donepezil Hcl] Diarrhea    Outpatient Encounter Medications as of 10/07/2019  Medication Sig  . atorvastatin (LIPITOR) 20 MG tablet Take 1 tablet (20 mg total) by mouth daily.  . Calcium Carbonate (CALCIUM 600 PO) Take 1 tablet by mouth daily.   . Cholecalciferol (VITAMIN D) 50 MCG (2000 UT) CAPS Take 1 capsule (2,000 Units total) by  mouth daily.  Marland Kitchen denosumab (PROLIA) 60 MG/ML SOLN injection Inject 60 mg into the skin every 6 (six) months. Administer in upper arm, thigh, or abdomen  . Melatonin 5 MG CAPS Take 1 capsule (5 mg total) by mouth at bedtime as needed.  . memantine (NAMENDA) 10 MG tablet Take 1 tablet (10 mg total) by mouth 2 (two) times daily.  Marland Kitchen senna (SENOKOT) 8.6 MG TABS tablet Take 1 tablet (8.6 mg total) by mouth daily as needed for mild constipation.  . sertraline (ZOLOFT) 50 MG tablet Take 1 tablet (50 mg total) by mouth daily.   No facility-administered encounter medications on file as of 10/07/2019.    Review of Systems:  Review of Systems    Constitutional: Negative for chills, fever and malaise/fatigue.  HENT: Positive for hearing loss. Negative for congestion and sore throat.   Eyes: Negative for blurred vision.  Respiratory: Negative for cough and shortness of breath.        Has started smoking!!!  Cardiovascular: Negative for chest pain, palpitations and leg swelling.  Gastrointestinal: Negative for abdominal pain and constipation.  Genitourinary: Negative for dysuria.  Musculoskeletal: Positive for joint pain and myalgias. Negative for falls.  Skin: Negative for itching and rash.  Neurological: Negative for dizziness and loss of consciousness.  Psychiatric/Behavioral: Positive for memory loss. Negative for depression. The patient is not nervous/anxious and does not have insomnia.     Health Maintenance  Topic Date Due  . PNA vac Low Risk Adult (2 of 2 - PPSV23) 01/16/2014  . INFLUENZA VACCINE  10/04/2019  . TETANUS/TDAP  01/14/2022  . DEXA SCAN  Completed  . COVID-19 Vaccine  Completed    Physical Exam: Vitals:   10/07/19 0938  BP: 122/82  Pulse: 83  Temp: 97.7 F (36.5 C)  TempSrc: Temporal  SpO2: 97%  Weight: 141 lb (64 kg)  Height: 5' 5"  (1.651 m)   Body mass index is 23.46 kg/m. Physical Exam Vitals reviewed.  Constitutional:      General: She is not in acute distress.    Appearance: Normal appearance. She is not toxic-appearing.  HENT:     Head: Normocephalic and atraumatic.     Right Ear: External ear normal.     Left Ear: External ear normal.  Eyes:     Extraocular Movements: Extraocular movements intact.     Pupils: Pupils are equal, round, and reactive to light.  Cardiovascular:     Rate and Rhythm: Normal rate and regular rhythm.     Pulses: Normal pulses.     Heart sounds: Normal heart sounds.  Pulmonary:     Effort: Pulmonary effort is normal.     Breath sounds: Normal breath sounds. No wheezing.  Musculoskeletal:        General: Tenderness present.     Cervical back: Neck  supple.     Comments: Of left shoulder anteriorly, ROM fairly good, knees appear normal and nontender; walks leaned forward (not stooped but whole body leaned forward)  Lymphadenopathy:     Cervical: No cervical adenopathy.  Skin:    Comments: Small abrasion of plantar surface of left foot  Neurological:     General: No focal deficit present.     Mental Status: She is alert.     Cranial Nerves: No cranial nerve deficit.     Gait: Gait abnormal.     Comments: Short-term memory poor, repeats questions every few minutes  Psychiatric:        Mood and Affect:  Mood normal.     Labs reviewed: Basic Metabolic Panel: Recent Labs    06/18/19 0000 06/18/19 0710  NA 141  --   K 4.4  --   CL 103  --   CO2 23*  --   BUN 22*  --   CREATININE 1.1  --   CALCIUM  --  9.7   Liver Function Tests: Recent Labs    06/18/19 0710  AST 20  ALT 17  ALBUMIN 4.4   No results for input(s): LIPASE, AMYLASE in the last 8760 hours. No results for input(s): AMMONIA in the last 8760 hours. CBC: Recent Labs    06/18/19 0710  WBC 6.7  HGB 14.6  HCT 44  PLT 261   Lipid Panel: Recent Labs    06/18/19 0710  CHOL 193  HDL 87*  LDLCALC 87  TRIG 97   No results found for: HGBA1C  Procedures since last visit: No results found.  Assessment/Plan 1. 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 Left shoulder tendonitis -getting therapy, continue  3. Primary osteoarthritis of right knee -working with therapy, continue, may use tylenol, topical voltaren 4g qid prn   4. Mixed obsessional thoughts and acts -not so bad recently, it seems, continue zoloft -met with [facility] but counselor feels not too beneficial for pt due to  dementia  5. Abrasion, foot with infection, left, initial encounter -counseled on keep foot clean and dry, apply bandaid with abx ointment during the day, off at night  6. 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 Demont Linford, D.O. [facility] [address], [place] [address] Cell Phone (Mon-Fri 8am-5pm):  [phone] On Call:  [phone] & follow prompts after 5pm & weekends Office Phone:  [phone] Office Fax:  [phone]

## 2019-10-08 ENCOUNTER — Encounter: Payer: Self-pay | Admitting: Internal Medicine

## 2019-10-08 DIAGNOSIS — R278 Other lack of coordination: Secondary | ICD-10-CM | POA: Diagnosis not present

## 2019-10-08 DIAGNOSIS — M25511 Pain in right shoulder: Secondary | ICD-10-CM | POA: Diagnosis not present

## 2019-10-08 DIAGNOSIS — M6389 Disorders of muscle in diseases classified elsewhere, multiple sites: Secondary | ICD-10-CM | POA: Diagnosis not present

## 2019-10-08 DIAGNOSIS — R4189 Other symptoms and signs involving cognitive functions and awareness: Secondary | ICD-10-CM | POA: Diagnosis not present

## 2019-10-08 DIAGNOSIS — M778 Other enthesopathies, not elsewhere classified: Secondary | ICD-10-CM | POA: Diagnosis not present

## 2019-10-08 DIAGNOSIS — M25512 Pain in left shoulder: Secondary | ICD-10-CM | POA: Diagnosis not present

## 2019-10-13 DIAGNOSIS — R278 Other lack of coordination: Secondary | ICD-10-CM | POA: Diagnosis not present

## 2019-10-13 DIAGNOSIS — M1711 Unilateral primary osteoarthritis, right knee: Secondary | ICD-10-CM | POA: Insufficient documentation

## 2019-10-13 DIAGNOSIS — M25511 Pain in right shoulder: Secondary | ICD-10-CM | POA: Diagnosis not present

## 2019-10-13 DIAGNOSIS — S90812A Abrasion, left foot, initial encounter: Secondary | ICD-10-CM | POA: Insufficient documentation

## 2019-10-13 DIAGNOSIS — G3109 Other frontotemporal dementia: Secondary | ICD-10-CM | POA: Insufficient documentation

## 2019-10-13 DIAGNOSIS — M25512 Pain in left shoulder: Secondary | ICD-10-CM | POA: Diagnosis not present

## 2019-10-13 DIAGNOSIS — F422 Mixed obsessional thoughts and acts: Secondary | ICD-10-CM | POA: Insufficient documentation

## 2019-10-13 DIAGNOSIS — F0281 Dementia in other diseases classified elsewhere with behavioral disturbance: Secondary | ICD-10-CM | POA: Insufficient documentation

## 2019-10-13 DIAGNOSIS — F17200 Nicotine dependence, unspecified, uncomplicated: Secondary | ICD-10-CM | POA: Insufficient documentation

## 2019-10-13 DIAGNOSIS — M778 Other enthesopathies, not elsewhere classified: Secondary | ICD-10-CM | POA: Diagnosis not present

## 2019-10-13 DIAGNOSIS — L089 Local infection of the skin and subcutaneous tissue, unspecified: Secondary | ICD-10-CM | POA: Insufficient documentation

## 2019-10-13 DIAGNOSIS — R4189 Other symptoms and signs involving cognitive functions and awareness: Secondary | ICD-10-CM | POA: Diagnosis not present

## 2019-10-13 DIAGNOSIS — M6389 Disorders of muscle in diseases classified elsewhere, multiple sites: Secondary | ICD-10-CM | POA: Diagnosis not present

## 2019-10-15 DIAGNOSIS — M778 Other enthesopathies, not elsewhere classified: Secondary | ICD-10-CM | POA: Diagnosis not present

## 2019-10-15 DIAGNOSIS — R4189 Other symptoms and signs involving cognitive functions and awareness: Secondary | ICD-10-CM | POA: Diagnosis not present

## 2019-10-15 DIAGNOSIS — M25511 Pain in right shoulder: Secondary | ICD-10-CM | POA: Diagnosis not present

## 2019-10-15 DIAGNOSIS — M25512 Pain in left shoulder: Secondary | ICD-10-CM | POA: Diagnosis not present

## 2019-10-15 DIAGNOSIS — M6389 Disorders of muscle in diseases classified elsewhere, multiple sites: Secondary | ICD-10-CM | POA: Diagnosis not present

## 2019-10-15 DIAGNOSIS — R278 Other lack of coordination: Secondary | ICD-10-CM | POA: Diagnosis not present

## 2019-11-09 ENCOUNTER — Encounter: Payer: Self-pay | Admitting: Internal Medicine

## 2019-11-11 ENCOUNTER — Non-Acute Institutional Stay: Payer: Medicare Other | Admitting: Internal Medicine

## 2019-11-11 ENCOUNTER — Other Ambulatory Visit: Payer: Self-pay

## 2019-11-11 ENCOUNTER — Encounter: Payer: Self-pay | Admitting: Internal Medicine

## 2019-11-11 VITALS — BP 110/62 | HR 74 | Temp 97.3°F | Ht 65.0 in | Wt 141.0 lb

## 2019-11-11 DIAGNOSIS — G3109 Other frontotemporal dementia: Secondary | ICD-10-CM | POA: Diagnosis not present

## 2019-11-11 DIAGNOSIS — B029 Zoster without complications: Secondary | ICD-10-CM | POA: Diagnosis not present

## 2019-11-11 DIAGNOSIS — F0281 Dementia in other diseases classified elsewhere with behavioral disturbance: Secondary | ICD-10-CM | POA: Diagnosis not present

## 2019-11-11 MED ORDER — VALACYCLOVIR HCL 1 G PO TABS: 1000.0000 mg | ORAL_TABLET | Freq: Three times a day (TID) | ORAL | 0 refills | Status: AC

## 2019-11-11 NOTE — Progress Notes (Signed)
Location:  [facility]   Place of Service:  Clinic (12)  Provider: Toluwani Yadav L. Mariea Clonts, D.O., C.M.D.  Code Status: DNR Goals of Care:  Advanced Directives 11/11/2019  Does Patient Have a Medical Advance Directive? Yes  Type of Advance Directive Out of facility DNR (pink MOST or yellow form)  Does patient want to make changes to medical advance directive? No - Patient declined  Copy of [place] in Chart? -  Would patient like information on creating a medical advance directive? -  Pre-existing out of facility DNR order (yellow form or pink MOST form) -     Chief Complaint  Patient presents with  . Acute Visit    Rash on the back of her head, nape of the neck, pains on side of head     HPI: Patient is a 85 y.o. female with frontotemporal dementia, prior stroke and alcohol abuse seen today for an acute visit for rash on the back of her head, nape, and pains on the side of her head since her stroke years ago.  Her caregiver had sent me a message about the rash and a photo two days ago.  It is similar to something she had in the past and was treated "as herpes".  Pt reports it does not itch or burn, but she is not a good historian and her story can change minute to minute.    Past Medical History:  Diagnosis Date  . Aneurysm ([place])   . Arthritis    osteo  . Cystitis 03/02/2014   acute   . Dyslipidemia   . Hearing loss bil   got hearing aides.     Past Surgical History:  Procedure Laterality Date  . ABDOMINAL HYSTERECTOMY    . RADIOLOGY WITH ANESTHESIA N/A 03/04/2014   Procedure: RADIOLOGY WITH ANESTHESIA;  Surgeon: Rob Hickman, MD;  Location: [facility];  Service: Radiology;  Laterality: N/A;  . RADIOLOGY WITH ANESTHESIA N/A 03/04/2014   Procedure: RADIOLOGY WITH ANESTHESIA;  Surgeon: Medication Radiologist, MD;  Location: [facility];  Service: Radiology;  Laterality: N/A;  . VENTRICULOPERITONEAL SHUNT Right 03/15/2014   Procedure: SHUNT INSERTION  VENTRICULAR-PERITONEAL;  Surgeon: Charlie Pitter, MD;  [facility];  Service: Neurosurgery;  Laterality: Right;    Allergies  Allergen Reactions  . Aricept [Donepezil Hcl] Diarrhea    Outpatient Encounter Medications as of 11/11/2019  Medication Sig  . atorvastatin (LIPITOR) 20 MG tablet Take 1 tablet (20 mg total) by mouth daily.  . Calcium Carbonate (CALCIUM 600 PO) Take 1 tablet by mouth daily.   . Cholecalciferol (VITAMIN D) 50 MCG (2000 UT) CAPS Take 1 capsule (2,000 Units total) by mouth daily.  Marland Kitchen denosumab (PROLIA) 60 MG/ML SOLN injection Inject 60 mg into the skin every 6 (six) months. Administer in upper arm, thigh, or abdomen  . Melatonin 5 MG CAPS Take 1 capsule (5 mg total) by mouth at bedtime as needed.  . memantine (NAMENDA) 10 MG tablet Take 1 tablet (10 mg total) by mouth 2 (two) times daily.  Marland Kitchen senna (SENOKOT) 8.6 MG TABS tablet Take 1 tablet (8.6 mg total) by mouth daily as needed for mild constipation.  . sertraline (ZOLOFT) 50 MG tablet Take 1 tablet (50 mg total) by mouth daily.   No facility-administered encounter medications on file as of 11/11/2019.    Review of Systems:  Review of Systems  Constitutional: Negative for chills and fever.  HENT: Negative for congestion.   Eyes: Negative for  blurred vision.  Respiratory: Negative for shortness of breath.   Cardiovascular: Negative for chest pain and palpitations.  Gastrointestinal: Negative for abdominal pain.  Genitourinary: Negative for dysuria.  Musculoskeletal: Positive for falls.  Skin: Positive for rash. Negative for itching.  Neurological: Negative for dizziness and loss of consciousness.  Psychiatric/Behavioral: Positive for memory loss.    Health Maintenance  Topic Date Due  . PNA vac Low Risk Adult (2 of 2 - PPSV23) 01/16/2014  . INFLUENZA VACCINE  10/04/2019  . TETANUS/TDAP  01/14/2022  . DEXA SCAN  Completed  . COVID-19 Vaccine  Completed    Physical Exam: Vitals:   11/11/19 1000   BP: 110/62  Pulse: 74  Temp: (!) 97.3 F (36.3 C)  TempSrc: Temporal  SpO2: 94%  Weight: 141 lb (64 kg)  Height: 5\' 5"  (1.651 m)   Body mass index is 23.46 kg/m. Physical Exam Vitals reviewed.  Constitutional:      General: She is not in acute distress.    Appearance: Normal appearance. She is not toxic-appearing.  HENT:     Head: Normocephalic and atraumatic.  Cardiovascular:     Rate and Rhythm: Normal rate and regular rhythm.     Pulses: Normal pulses.     Heart sounds: Normal heart sounds.  Pulmonary:     Effort: Pulmonary effort is normal.     Breath sounds: Normal breath sounds.  Skin:    Comments: Papulovesicular rash of posterior scalp and nape, appears already to be drying up, nontender, no draining vesicles is more to the left side  Neurological:     Mental Status: She is alert. Mental status is at baseline.  Psychiatric:        Mood and Affect: Mood normal.    Labs reviewed: Basic Metabolic Panel: Recent Labs    06/18/19 0000 06/18/19 0710  NA 141  --   K 4.4  --   CL 103  --   CO2 23*  --   BUN 22*  --   CREATININE 1.1  --   CALCIUM  --  9.7   Liver Function Tests: Recent Labs    06/18/19 0710  AST 20  ALT 17  ALBUMIN 4.4   No results for input(s): LIPASE, AMYLASE in the last 8760 hours. No results for input(s): AMMONIA in the last 8760 hours. CBC: Recent Labs    06/18/19 0710  WBC 6.7  HGB 14.6  HCT 44  PLT 261   Lipid Panel: Recent Labs    06/18/19 0710  CHOL 193  HDL 87*  LDLCALC 87  TRIG 97   No results found for: HGBA1C  Procedures since last visit: No results found.  Assessment/Plan 1. Herpes zoster without complication -will tx with valacyclovir 1000mg  tid x 7 days as shingles  2. 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 Akyia Borelli, D.O. [facility] [address], [place] [address] Cell Phone (Mon-Fri 8am-5pm):  [phone] On Call:  [phone] & follow prompts after 5pm & weekends Office Phone:  [phone] Office Fax:  [phone]

## 2019-11-16 ENCOUNTER — Ambulatory Visit (INDEPENDENT_AMBULATORY_CARE_PROVIDER_SITE_OTHER): Payer: Medicare Other

## 2019-11-16 ENCOUNTER — Other Ambulatory Visit: Payer: Self-pay

## 2019-11-16 DIAGNOSIS — M810 Age-related osteoporosis without current pathological fracture: Secondary | ICD-10-CM | POA: Diagnosis not present

## 2019-11-16 MED ORDER — DENOSUMAB 60 MG/ML ~~LOC~~ SOSY
60.0000 mg | PREFILLED_SYRINGE | Freq: Once | SUBCUTANEOUS | Status: AC
  Administered 2019-11-16: 60 mg via SUBCUTANEOUS

## 2019-12-09 DIAGNOSIS — Z23 Encounter for immunization: Secondary | ICD-10-CM | POA: Diagnosis not present

## 2019-12-23 DIAGNOSIS — D1801 Hemangioma of skin and subcutaneous tissue: Secondary | ICD-10-CM | POA: Diagnosis not present

## 2019-12-23 DIAGNOSIS — L570 Actinic keratosis: Secondary | ICD-10-CM | POA: Diagnosis not present

## 2019-12-23 DIAGNOSIS — L821 Other seborrheic keratosis: Secondary | ICD-10-CM | POA: Diagnosis not present

## 2019-12-23 DIAGNOSIS — L748 Other eccrine sweat disorders: Secondary | ICD-10-CM | POA: Diagnosis not present

## 2019-12-23 DIAGNOSIS — L814 Other melanin hyperpigmentation: Secondary | ICD-10-CM | POA: Diagnosis not present

## 2019-12-23 DIAGNOSIS — L820 Inflamed seborrheic keratosis: Secondary | ICD-10-CM | POA: Diagnosis not present

## 2020-01-19 DIAGNOSIS — Z23 Encounter for immunization: Secondary | ICD-10-CM | POA: Diagnosis not present

## 2020-02-03 ENCOUNTER — Other Ambulatory Visit: Payer: Self-pay

## 2020-02-03 ENCOUNTER — Encounter: Payer: Self-pay | Admitting: Internal Medicine

## 2020-02-03 ENCOUNTER — Non-Acute Institutional Stay: Payer: Medicare Other | Admitting: Internal Medicine

## 2020-02-03 VITALS — BP 118/62 | HR 81 | Temp 97.7°F | Ht 65.0 in | Wt 139.4 lb

## 2020-02-03 DIAGNOSIS — F0281 Dementia in other diseases classified elsewhere with behavioral disturbance: Secondary | ICD-10-CM | POA: Diagnosis not present

## 2020-02-03 DIAGNOSIS — Z9181 History of falling: Secondary | ICD-10-CM

## 2020-02-03 DIAGNOSIS — G3109 Other frontotemporal dementia: Secondary | ICD-10-CM

## 2020-02-03 DIAGNOSIS — R2681 Unsteadiness on feet: Secondary | ICD-10-CM

## 2020-02-03 DIAGNOSIS — M1711 Unilateral primary osteoarthritis, right knee: Secondary | ICD-10-CM | POA: Diagnosis not present

## 2020-02-03 DIAGNOSIS — M25561 Pain in right knee: Secondary | ICD-10-CM

## 2020-02-03 NOTE — Progress Notes (Signed)
Location:  Occupational psychologist of Service:  Clinic (12)  Provider: Dewain Platz L. Mariea Clonts, D.O., C.M.D.  Code Status: DNR Goals of Care:  Advanced Directives 02/03/2020  Does Patient Have a Medical Advance Directive? Yes  Type of Advance Directive Out of facility DNR (pink MOST or yellow form)  Does patient want to make changes to medical advance directive? No - Guardian declined  Copy of [place] in Chart? -  Would patient like information on creating a medical advance directive? -  Pre-existing out of facility DNR order (yellow form or pink MOST form) -   Chief Complaint  Patient presents with  . Medical Management of Chronic Issues    4 month follow up    HPI: Patient is a 85 y.o. female seen today for medical management of chronic diseases.    She had a fall recently.  It's not clear when b/c she did not tell Christa about it.  It's been within the past few weeks.  She's been walking wonky b/c her knee hurts.  Right knee hurts more, but sometimes the left knee hurts and sometimes her lower back hurts.  She is still doing water aerobics each morning.   Her knees feel good in the water.  Not doing yoga now.    Not wearing her hearing aids.    Past Medical History:  Diagnosis Date  . Aneurysm ([place])   . Arthritis    osteo  . Cystitis 03/02/2014   acute   . Dyslipidemia   . Hearing loss bil   got hearing aides.     Past Surgical History:  Procedure Laterality Date  . ABDOMINAL HYSTERECTOMY    . RADIOLOGY WITH ANESTHESIA N/A 03/04/2014   Procedure: RADIOLOGY WITH ANESTHESIA;  Surgeon: Rob Hickman, MD;  Location: [facility];  Service: Radiology;  Laterality: N/A;  . RADIOLOGY WITH ANESTHESIA N/A 03/04/2014   Procedure: RADIOLOGY WITH ANESTHESIA;  Surgeon: Medication Radiologist, MD;  Location: [facility];  Service: Radiology;  Laterality: N/A;  . VENTRICULOPERITONEAL SHUNT Right 03/15/2014   Procedure: SHUNT INSERTION  VENTRICULAR-PERITONEAL;  Surgeon: Charlie Pitter, MD;  [facility];  Service: Neurosurgery;  Laterality: Right;    Allergies  Allergen Reactions  . Aricept [Donepezil Hcl] Diarrhea    Outpatient Encounter Medications as of 02/03/2020  Medication Sig  . atorvastatin (LIPITOR) 20 MG tablet Take 1 tablet (20 mg total) by mouth daily.  . Calcium Carbonate (CALCIUM 600 PO) Take 1 tablet by mouth daily.   . Cholecalciferol (VITAMIN D) 50 MCG (2000 UT) CAPS Take 1 capsule (2,000 Units total) by mouth daily.  Marland Kitchen denosumab (PROLIA) 60 MG/ML SOLN injection Inject 60 mg into the skin every 6 (six) months. Administer in upper arm, thigh, or abdomen  . Melatonin 5 MG CAPS Take 1 capsule (5 mg total) by mouth at bedtime as needed.  . memantine (NAMENDA) 10 MG tablet Take 1 tablet (10 mg total) by mouth 2 (two) times daily.  Marland Kitchen senna (SENOKOT) 8.6 MG TABS tablet Take 1 tablet (8.6 mg total) by mouth daily as needed for mild constipation.  . sertraline (ZOLOFT) 50 MG tablet Take 1 tablet (50 mg total) by mouth daily.  . valACYclovir (VALTREX) 1000 MG tablet Take 1 tablet (1,000 mg total) by mouth 3 (three) times daily.   No facility-administered encounter medications on file as of 02/03/2020.    Review of Systems:  Review of Systems  Constitutional: Negative for chills, fever and  malaise/fatigue.  HENT: Positive for hearing loss. Negative for congestion and sore throat.        Not wearing hearing aids today  Eyes: Negative for blurred vision.  Respiratory: Negative for cough and shortness of breath.   Cardiovascular: Negative for chest pain, palpitations and leg swelling.  Gastrointestinal: Negative for abdominal pain.  Genitourinary: Negative for dysuria.  Musculoskeletal: Positive for back pain, falls and joint pain.  Skin: Negative for itching and rash.    Health Maintenance  Topic Date Due  . PNA vac Low Risk Adult (2 of 2 - PPSV23) 01/16/2014  . TETANUS/TDAP  01/14/2022  .  INFLUENZA VACCINE  Completed  . DEXA SCAN  Completed  . COVID-19 Vaccine  Completed    Physical Exam: Vitals:   02/03/20 0904  BP: 118/62  Pulse: 81  Temp: 97.7 F (36.5 C)  TempSrc: Temporal  SpO2: 94%  Weight: 139 lb 6.4 oz (63.2 kg)  Height: 5\' 5"  (1.651 m)   Body mass index is 23.2 kg/m. Physical Exam Vitals reviewed.  Constitutional:      Appearance: Normal appearance.  HENT:     Head: Normocephalic and atraumatic.  Cardiovascular:     Rate and Rhythm: Normal rate and regular rhythm.  Pulmonary:     Effort: Pulmonary effort is normal.     Breath sounds: Normal breath sounds. No wheezing, rhonchi or rales.  Musculoskeletal:        General: Normal range of motion.     Right lower leg: No edema.     Left lower leg: No edema.     Comments: Right medial knee pain and effusion; valgus deformity of knees, unsteady gait--not using assistive device and holds onto Christa  Skin:    General: Skin is warm and dry.  Neurological:     General: No focal deficit present.     Mental Status: She is alert. Mental status is at baseline.     Motor: No weakness.     Gait: Gait abnormal.  Psychiatric:     Comments: At baseline mood     Labs reviewed: Basic Metabolic Panel: Recent Labs    06/18/19 0000 06/18/19 0710  NA 141  --   K 4.4  --   CL 103  --   CO2 23*  --   BUN 22*  --   CREATININE 1.1  --   CALCIUM  --  9.7   Liver Function Tests: Recent Labs    06/18/19 0710  AST 20  ALT 17  ALBUMIN 4.4   No results for input(s): LIPASE, AMYLASE in the last 8760 hours. No results for input(s): AMMONIA in the last 8760 hours. CBC: Recent Labs    06/18/19 0710  WBC 6.7  HGB 14.6  HCT 44  PLT 261   Lipid Panel: Recent Labs    06/18/19 0710  CHOL 193  HDL 87*  LDLCALC 87  TRIG 97   No results found for: HGBA1C  Procedures since last visit: No results found.  Assessment/Plan 1. Right medial knee pain -using some tylenol when caregiver  suggests -otherwise only takes what's in pillbox  -recommended ice when it hurts for 20 min intervals due to inflammation -topicals like voltaren gel or biofreeze also ok  2. Primary osteoarthritis of right knee -at baseline, but made worse with recent fall  3. History of recent fall -needs PT eval and tx--caregiver noted tremendous benefit last time  4. Unsteady gait when walking -PT eval and tx, should be  using assistive device for balance but remembering it may be challenging  5. 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 Thorn Demas, D.O. [facility] [address], [place] [address] Cell Phone (Mon-Fri 8am-5pm):  [phone] On Call:  [phone] & follow prompts after 5pm & weekends Office Phone:  [phone] Office Fax:  [phone]

## 2020-03-18 DIAGNOSIS — R2681 Unsteadiness on feet: Secondary | ICD-10-CM | POA: Diagnosis not present

## 2020-03-18 DIAGNOSIS — M25562 Pain in left knee: Secondary | ICD-10-CM | POA: Diagnosis not present

## 2020-03-18 DIAGNOSIS — M25561 Pain in right knee: Secondary | ICD-10-CM | POA: Diagnosis not present

## 2020-03-18 DIAGNOSIS — R296 Repeated falls: Secondary | ICD-10-CM | POA: Diagnosis not present

## 2020-03-18 DIAGNOSIS — R2689 Other abnormalities of gait and mobility: Secondary | ICD-10-CM | POA: Diagnosis not present

## 2020-03-21 DIAGNOSIS — R2681 Unsteadiness on feet: Secondary | ICD-10-CM | POA: Diagnosis not present

## 2020-03-21 DIAGNOSIS — M25562 Pain in left knee: Secondary | ICD-10-CM | POA: Diagnosis not present

## 2020-03-21 DIAGNOSIS — R2689 Other abnormalities of gait and mobility: Secondary | ICD-10-CM | POA: Diagnosis not present

## 2020-03-21 DIAGNOSIS — R296 Repeated falls: Secondary | ICD-10-CM | POA: Diagnosis not present

## 2020-03-21 DIAGNOSIS — M25561 Pain in right knee: Secondary | ICD-10-CM | POA: Diagnosis not present

## 2020-03-22 DIAGNOSIS — R296 Repeated falls: Secondary | ICD-10-CM | POA: Diagnosis not present

## 2020-03-22 DIAGNOSIS — M25562 Pain in left knee: Secondary | ICD-10-CM | POA: Diagnosis not present

## 2020-03-22 DIAGNOSIS — M25561 Pain in right knee: Secondary | ICD-10-CM | POA: Diagnosis not present

## 2020-03-22 DIAGNOSIS — R2681 Unsteadiness on feet: Secondary | ICD-10-CM | POA: Diagnosis not present

## 2020-03-22 DIAGNOSIS — R2689 Other abnormalities of gait and mobility: Secondary | ICD-10-CM | POA: Diagnosis not present

## 2020-03-23 DIAGNOSIS — M25561 Pain in right knee: Secondary | ICD-10-CM | POA: Diagnosis not present

## 2020-03-23 DIAGNOSIS — R2681 Unsteadiness on feet: Secondary | ICD-10-CM | POA: Diagnosis not present

## 2020-03-23 DIAGNOSIS — R296 Repeated falls: Secondary | ICD-10-CM | POA: Diagnosis not present

## 2020-03-23 DIAGNOSIS — M25562 Pain in left knee: Secondary | ICD-10-CM | POA: Diagnosis not present

## 2020-03-23 DIAGNOSIS — R2689 Other abnormalities of gait and mobility: Secondary | ICD-10-CM | POA: Diagnosis not present

## 2020-03-28 ENCOUNTER — Other Ambulatory Visit: Payer: Self-pay

## 2020-03-28 ENCOUNTER — Encounter: Payer: Medicare Other | Admitting: Nurse Practitioner

## 2020-03-28 DIAGNOSIS — R2689 Other abnormalities of gait and mobility: Secondary | ICD-10-CM | POA: Diagnosis not present

## 2020-03-28 DIAGNOSIS — R296 Repeated falls: Secondary | ICD-10-CM | POA: Diagnosis not present

## 2020-03-28 DIAGNOSIS — R2681 Unsteadiness on feet: Secondary | ICD-10-CM | POA: Diagnosis not present

## 2020-03-28 DIAGNOSIS — M25562 Pain in left knee: Secondary | ICD-10-CM | POA: Diagnosis not present

## 2020-03-28 DIAGNOSIS — M25561 Pain in right knee: Secondary | ICD-10-CM | POA: Diagnosis not present

## 2020-03-29 DIAGNOSIS — M25561 Pain in right knee: Secondary | ICD-10-CM | POA: Diagnosis not present

## 2020-03-29 DIAGNOSIS — M25562 Pain in left knee: Secondary | ICD-10-CM | POA: Diagnosis not present

## 2020-03-29 DIAGNOSIS — R2681 Unsteadiness on feet: Secondary | ICD-10-CM | POA: Diagnosis not present

## 2020-03-29 DIAGNOSIS — R296 Repeated falls: Secondary | ICD-10-CM | POA: Diagnosis not present

## 2020-03-29 DIAGNOSIS — R2689 Other abnormalities of gait and mobility: Secondary | ICD-10-CM | POA: Diagnosis not present

## 2020-03-30 DIAGNOSIS — R2681 Unsteadiness on feet: Secondary | ICD-10-CM | POA: Diagnosis not present

## 2020-03-30 DIAGNOSIS — R296 Repeated falls: Secondary | ICD-10-CM | POA: Diagnosis not present

## 2020-03-30 DIAGNOSIS — R2689 Other abnormalities of gait and mobility: Secondary | ICD-10-CM | POA: Diagnosis not present

## 2020-03-30 DIAGNOSIS — M25562 Pain in left knee: Secondary | ICD-10-CM | POA: Diagnosis not present

## 2020-03-30 DIAGNOSIS — M25561 Pain in right knee: Secondary | ICD-10-CM | POA: Diagnosis not present

## 2020-04-04 DIAGNOSIS — M25562 Pain in left knee: Secondary | ICD-10-CM | POA: Diagnosis not present

## 2020-04-04 DIAGNOSIS — R296 Repeated falls: Secondary | ICD-10-CM | POA: Diagnosis not present

## 2020-04-04 DIAGNOSIS — R2681 Unsteadiness on feet: Secondary | ICD-10-CM | POA: Diagnosis not present

## 2020-04-04 DIAGNOSIS — R2689 Other abnormalities of gait and mobility: Secondary | ICD-10-CM | POA: Diagnosis not present

## 2020-04-04 DIAGNOSIS — M25561 Pain in right knee: Secondary | ICD-10-CM | POA: Diagnosis not present

## 2020-04-06 DIAGNOSIS — M25561 Pain in right knee: Secondary | ICD-10-CM | POA: Diagnosis not present

## 2020-04-06 DIAGNOSIS — M25562 Pain in left knee: Secondary | ICD-10-CM | POA: Diagnosis not present

## 2020-04-06 DIAGNOSIS — R296 Repeated falls: Secondary | ICD-10-CM | POA: Diagnosis not present

## 2020-04-06 DIAGNOSIS — R2689 Other abnormalities of gait and mobility: Secondary | ICD-10-CM | POA: Diagnosis not present

## 2020-04-06 DIAGNOSIS — R2681 Unsteadiness on feet: Secondary | ICD-10-CM | POA: Diagnosis not present

## 2020-04-11 ENCOUNTER — Telehealth: Payer: Self-pay

## 2020-04-11 DIAGNOSIS — R2681 Unsteadiness on feet: Secondary | ICD-10-CM | POA: Diagnosis not present

## 2020-04-11 DIAGNOSIS — M25561 Pain in right knee: Secondary | ICD-10-CM | POA: Diagnosis not present

## 2020-04-11 DIAGNOSIS — R2689 Other abnormalities of gait and mobility: Secondary | ICD-10-CM | POA: Diagnosis not present

## 2020-04-11 DIAGNOSIS — M25562 Pain in left knee: Secondary | ICD-10-CM | POA: Diagnosis not present

## 2020-04-11 DIAGNOSIS — R296 Repeated falls: Secondary | ICD-10-CM | POA: Diagnosis not present

## 2020-04-11 NOTE — Telephone Encounter (Signed)
Ms. Sharon [name] started smoking and would like to know if she should start using a patch to help her stopped  Clinic nurse(Heather) addressed the situation and shared the information with Dr. Mariea Clonts, who in turn said that she could start using the patch.

## 2020-04-13 ENCOUNTER — Other Ambulatory Visit: Payer: Self-pay | Admitting: Internal Medicine

## 2020-04-13 DIAGNOSIS — F0281 Dementia in other diseases classified elsewhere with behavioral disturbance: Secondary | ICD-10-CM

## 2020-04-13 DIAGNOSIS — F422 Mixed obsessional thoughts and acts: Secondary | ICD-10-CM

## 2020-04-13 DIAGNOSIS — F02818 Dementia in other diseases classified elsewhere, unspecified severity, with other behavioral disturbance: Secondary | ICD-10-CM

## 2020-04-18 ENCOUNTER — Encounter: Payer: Self-pay | Admitting: Internal Medicine

## 2020-04-18 DIAGNOSIS — R262 Difficulty in walking, not elsewhere classified: Secondary | ICD-10-CM | POA: Diagnosis not present

## 2020-04-18 DIAGNOSIS — M1990 Unspecified osteoarthritis, unspecified site: Secondary | ICD-10-CM | POA: Diagnosis not present

## 2020-04-18 DIAGNOSIS — R2681 Unsteadiness on feet: Secondary | ICD-10-CM | POA: Diagnosis not present

## 2020-04-18 DIAGNOSIS — M25561 Pain in right knee: Secondary | ICD-10-CM | POA: Diagnosis not present

## 2020-04-18 DIAGNOSIS — M6281 Muscle weakness (generalized): Secondary | ICD-10-CM | POA: Diagnosis not present

## 2020-04-19 DIAGNOSIS — R262 Difficulty in walking, not elsewhere classified: Secondary | ICD-10-CM | POA: Diagnosis not present

## 2020-04-19 DIAGNOSIS — Z741 Need for assistance with personal care: Secondary | ICD-10-CM | POA: Diagnosis not present

## 2020-04-19 DIAGNOSIS — M6281 Muscle weakness (generalized): Secondary | ICD-10-CM | POA: Diagnosis not present

## 2020-04-19 DIAGNOSIS — I639 Cerebral infarction, unspecified: Secondary | ICD-10-CM | POA: Diagnosis not present

## 2020-04-19 DIAGNOSIS — M25561 Pain in right knee: Secondary | ICD-10-CM | POA: Diagnosis not present

## 2020-04-19 DIAGNOSIS — M13862 Other specified arthritis, left knee: Secondary | ICD-10-CM | POA: Diagnosis not present

## 2020-04-19 DIAGNOSIS — R2681 Unsteadiness on feet: Secondary | ICD-10-CM | POA: Diagnosis not present

## 2020-04-19 DIAGNOSIS — R279 Unspecified lack of coordination: Secondary | ICD-10-CM | POA: Diagnosis not present

## 2020-04-19 DIAGNOSIS — M1990 Unspecified osteoarthritis, unspecified site: Secondary | ICD-10-CM | POA: Diagnosis not present

## 2020-04-20 ENCOUNTER — Ambulatory Visit: Payer: Medicare Other | Admitting: Internal Medicine

## 2020-04-20 ENCOUNTER — Encounter: Payer: Self-pay | Admitting: Internal Medicine

## 2020-04-20 ENCOUNTER — Other Ambulatory Visit: Payer: Self-pay

## 2020-04-20 VITALS — BP 122/64 | HR 98 | Temp 97.7°F | Ht 65.0 in | Wt 135.8 lb

## 2020-04-20 DIAGNOSIS — F0280 Dementia in other diseases classified elsewhere without behavioral disturbance: Secondary | ICD-10-CM | POA: Diagnosis not present

## 2020-04-20 DIAGNOSIS — Z8679 Personal history of other diseases of the circulatory system: Secondary | ICD-10-CM | POA: Diagnosis not present

## 2020-04-20 DIAGNOSIS — G919 Hydrocephalus, unspecified: Secondary | ICD-10-CM

## 2020-04-20 DIAGNOSIS — R2681 Unsteadiness on feet: Secondary | ICD-10-CM | POA: Diagnosis not present

## 2020-04-20 DIAGNOSIS — R279 Unspecified lack of coordination: Secondary | ICD-10-CM | POA: Diagnosis not present

## 2020-04-20 DIAGNOSIS — M25561 Pain in right knee: Secondary | ICD-10-CM | POA: Diagnosis not present

## 2020-04-20 DIAGNOSIS — M13862 Other specified arthritis, left knee: Secondary | ICD-10-CM | POA: Diagnosis not present

## 2020-04-20 DIAGNOSIS — G3109 Other frontotemporal dementia: Secondary | ICD-10-CM | POA: Diagnosis not present

## 2020-04-20 DIAGNOSIS — M6281 Muscle weakness (generalized): Secondary | ICD-10-CM | POA: Diagnosis not present

## 2020-04-20 DIAGNOSIS — Z741 Need for assistance with personal care: Secondary | ICD-10-CM | POA: Diagnosis not present

## 2020-04-20 DIAGNOSIS — M1990 Unspecified osteoarthritis, unspecified site: Secondary | ICD-10-CM | POA: Diagnosis not present

## 2020-04-20 DIAGNOSIS — M1711 Unilateral primary osteoarthritis, right knee: Secondary | ICD-10-CM

## 2020-04-20 DIAGNOSIS — R262 Difficulty in walking, not elsewhere classified: Secondary | ICD-10-CM | POA: Diagnosis not present

## 2020-04-20 DIAGNOSIS — I639 Cerebral infarction, unspecified: Secondary | ICD-10-CM | POA: Diagnosis not present

## 2020-04-20 NOTE — Progress Notes (Addendum)
Location:  [facility] Provider:  Elani Delph L. Mariea Clonts, D.O., C.M.D.  Code Status: DNR Goals of Care:  Advanced Directives 04/20/2020  Does Patient Have a Medical Advance Directive? Yes  Type of Advance Directive Out of facility DNR (pink MOST or yellow form)  Does patient want to make changes to medical advance directive? No - Patient declined  Copy of [place] in Chart? -  Would patient like information on creating a medical advance directive? -  Pre-existing out of facility DNR order (yellow form or pink MOST form) Pink MOST/Yellow Form most recent copy in chart - Physician notified to receive inpatient order   Chief Complaint  Patient presents with  . Acute Visit    Possible UTI    HPI: Patient is a 86 y.o. female seen today for acute visit for possible UTI and increased confusion, declining ability to get around.  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  Has not recently followed with neurosurgery, Dr. Annette Stable who placed it back then.  She'd had her aneurysm rupture/subarachnoid hemorrhage  At that time, as well.  She's had frontotemporal symptoms with disinhibition long-term, but short-term memory has gotten progressively worse along with newly worsening gait.  She has arthritis of her right knee that is bone on bone but she does not want to pursue surgery and is unable to really do therapy even now b/c she does not recall instructions which is why therapy through [facility] was discontinued.  AL has been recommended but she does not want to move.  The outside PT also recommended a neurologic eval.    Gait is quite staggered and worse even than last time when I had done the initial PT, OT referral.  She's not had labs since last April though I've ordered them--appts have been missed.  She is agreeable to coming at 8am tomorrow.      Past Medical History:  Diagnosis  Date  . Aneurysm ([place])   . Arthritis    osteo  . Cystitis 03/02/2014   acute   . Dyslipidemia   . Hearing loss bil   got hearing aides.     Past Surgical History:  Procedure Laterality Date  . ABDOMINAL HYSTERECTOMY    . RADIOLOGY WITH ANESTHESIA N/A 03/04/2014   Procedure: RADIOLOGY WITH ANESTHESIA;  Surgeon: Rob Hickman, MD;  Location: [facility];  Service: Radiology;  Laterality: N/A;  . RADIOLOGY WITH ANESTHESIA N/A 03/04/2014   Procedure: RADIOLOGY WITH ANESTHESIA;  Surgeon: Medication Radiologist, MD;  Location: [facility];  Service: Radiology;  Laterality: N/A;  . VENTRICULOPERITONEAL SHUNT Right 03/15/2014   Procedure: SHUNT INSERTION VENTRICULAR-PERITONEAL;  Surgeon: Charlie Pitter, MD;  Location: [facility];  Service: Neurosurgery;  Laterality: Right;    Allergies  Allergen Reactions  . Aricept [Donepezil Hcl] Diarrhea    Outpatient Encounter Medications as of 04/20/2020  Medication Sig  . atorvastatin (LIPITOR) 20 MG tablet TAKE 1 TABLET EVERY DAY  . Calcium Carbonate (CALCIUM 600 PO) Take 1 tablet by mouth daily.   . Cholecalciferol (VITAMIN D) 50 MCG (2000 UT) CAPS Take 1 capsule (2,000 Units total) by mouth daily.  Marland Kitchen denosumab (PROLIA) 60 MG/ML SOLN injection Inject 60 mg into the skin every 6 (six) months. Administer in upper arm, thigh, or abdomen  . Melatonin 5 MG CAPS Take 1 capsule (5 mg total) by mouth at bedtime as  needed.  . memantine (NAMENDA) 10 MG tablet TAKE 1 TABLET TWICE DAILY  . senna (SENOKOT) 8.6 MG TABS tablet Take 1 tablet (8.6 mg total) by mouth daily as needed for mild constipation.  . sertraline (ZOLOFT) 50 MG tablet TAKE 1 TABLET EVERY DAY  . valACYclovir (VALTREX) 1000 MG tablet Take 1 tablet (1,000 mg total) by mouth 3 (three) times daily.   No facility-administered encounter medications on file as of 04/20/2020.    Review of Systems:  Review of Systems  Constitutional: Negative for chills, fever and malaise/fatigue.  HENT: Negative for  congestion and sore throat.   Respiratory: Negative for cough and shortness of breath.   Cardiovascular: Negative for chest pain, palpitations and leg swelling.  Gastrointestinal: Negative for abdominal pain.  Genitourinary: Negative for dysuria, frequency and urgency.  Musculoskeletal: Positive for joint pain. Negative for falls (not just recently, but did fall in the past).       Right knee  Skin: Negative for itching and rash.  Neurological: Negative for dizziness and loss of consciousness.       Unsteady gait, swings right leg out  Endo/Heme/Allergies: Bruises/bleeds easily.  Psychiatric/Behavioral: Positive for memory loss. Negative for depression. The patient is not nervous/anxious and does not have insomnia.        Disinhibition    Health Maintenance  Topic Date Due  . PNA vac Low Risk Adult (2 of 2 - PPSV23) 01/16/2014  . COVID-19 Vaccine (4 - Booster for Moderna series) 07/18/2020  . TETANUS/TDAP  01/14/2022  . INFLUENZA VACCINE  Completed  . DEXA SCAN  Completed    Physical Exam: Vitals:   04/20/20 1050  BP: 122/64  Pulse: 98  Temp: 97.7 F (36.5 C)  TempSrc: Temporal  SpO2: 99%  Weight: 135 lb 12.8 oz (61.6 kg)  Height: 5\' 5"  (7.124 m)   Body mass index is 22.6 kg/m. Physical Exam Vitals reviewed.  Constitutional:      General: She is not in acute distress.    Appearance: Normal appearance. She is not toxic-appearing.  HENT:     Head: Normocephalic and atraumatic.     Comments: Has shunt from NPH in place Eyes:     Extraocular Movements: Extraocular movements intact.     Conjunctiva/sclera: Conjunctivae normal.     Pupils: Pupils are equal, round, and reactive to light.  Cardiovascular:     Rate and Rhythm: Normal rate and regular rhythm.     Pulses: Normal pulses.     Heart sounds: Normal heart sounds.  Pulmonary:     Effort: Pulmonary effort is normal.     Breath sounds: Normal breath sounds. No wheezing, rhonchi or rales.  Abdominal:      General: Bowel sounds are normal.  Musculoskeletal:     Right lower leg: No edema.     Left lower leg: No edema.     Comments: Swings right leg out when walking, holding onto caregivers  Neurological:     Mental Status: She is alert.     Gait: Gait abnormal.     Comments: Gait recently more unsteady, as above; oriented to person and place  Psychiatric:        Mood and Affect: Mood normal.     Labs reviewed: Basic Metabolic Panel: Recent Labs    06/18/19 0000 06/18/19 0710  NA 141  --   K 4.4  --   CL 103  --   CO2 23*  --   BUN 22*  --  CREATININE 1.1  --   CALCIUM  --  9.7   Liver Function Tests: Recent Labs    06/18/19 0710  AST 20  ALT 17  ALBUMIN 4.4   No results for input(s): LIPASE, AMYLASE in the last 8760 hours. No results for input(s): AMMONIA in the last 8760 hours. CBC: Recent Labs    06/18/19 0710  WBC 6.7  HGB 14.6  HCT 44  PLT 261   Lipid Panel: Recent Labs    06/18/19 0710  CHOL 193  HDL 87*  LDLCALC 87  TRIG 97   No results found for: HGBA1C  Procedures since last visit: No results found.  Assessment/Plan 1. Hydrocephalus with operating shunt ([place]) - gait newly more unsteady, cognition worse, reportedly no changes in continence (not an issue) - CT Head Wo Contrast; Future  2. H/O subarachnoid hemorrhage - initial stroke that led to cognitive decline back -also is recovered alcoholic - CT Head Wo Contrast; Future  3. Frontotemporal dementia ([place]) - dementia has always seemed most consistent with this with her disinhibition, spontaneous behaviors, volunteering to remove clothes often for exams when not needed -attributed to multiple causes from alcoholism of the past to [facility]  -recently declining and new therapy suggested UA c+s--resident only able to make drop of urine so will have to bring back sample (doubt infection w/o urinary changes and also doubt clean sample can be obtained) - CT Head Wo Contrast; Future -suspect  natural progression of dementia plus difficulty with her right knee arthritis progressing  4. Primary osteoarthritis of right knee -has been told she needs a knee replacement and she does not want this (also not a good candidate for rehab when unable to reproduce exercises with short-term memory loss) -use tylenol, voltaren gel, other topicals of choice  5. Unsteady gait when walking -getting PT, OT at home from [facility]  - CT Head Wo Contrast; Future  Labs/tests ordered:  Cbc, cmp, b12, tsh, flp in am, UA c+s and CT brain to be sure not a shunt problem, f/u with Dr. Annette Stable, neurosurgery if needed   Next appt:  05/17/2020   Ahonesty Woodfin L. Samaiya Awadallah, D.O. [facility] [address], [place] [address] Cell Phone (Mon-Fri 8am-5pm):  [phone] On Call:  [phone] & follow prompts after 5pm & weekends Office Phone:  [phone] Office Fax:  [phone]

## 2020-04-21 DIAGNOSIS — R2681 Unsteadiness on feet: Secondary | ICD-10-CM | POA: Diagnosis not present

## 2020-04-21 DIAGNOSIS — M25561 Pain in right knee: Secondary | ICD-10-CM | POA: Diagnosis not present

## 2020-04-21 DIAGNOSIS — M1990 Unspecified osteoarthritis, unspecified site: Secondary | ICD-10-CM | POA: Diagnosis not present

## 2020-04-21 DIAGNOSIS — D519 Vitamin B12 deficiency anemia, unspecified: Secondary | ICD-10-CM | POA: Diagnosis not present

## 2020-04-21 DIAGNOSIS — E785 Hyperlipidemia, unspecified: Secondary | ICD-10-CM | POA: Diagnosis not present

## 2020-04-21 DIAGNOSIS — Z741 Need for assistance with personal care: Secondary | ICD-10-CM | POA: Diagnosis not present

## 2020-04-21 DIAGNOSIS — I729 Aneurysm of unspecified site: Secondary | ICD-10-CM | POA: Diagnosis not present

## 2020-04-21 DIAGNOSIS — I639 Cerebral infarction, unspecified: Secondary | ICD-10-CM | POA: Diagnosis not present

## 2020-04-21 DIAGNOSIS — N3090 Cystitis, unspecified without hematuria: Secondary | ICD-10-CM | POA: Diagnosis not present

## 2020-04-21 DIAGNOSIS — R279 Unspecified lack of coordination: Secondary | ICD-10-CM | POA: Diagnosis not present

## 2020-04-21 DIAGNOSIS — J9600 Acute respiratory failure, unspecified whether with hypoxia or hypercapnia: Secondary | ICD-10-CM | POA: Diagnosis not present

## 2020-04-21 DIAGNOSIS — R262 Difficulty in walking, not elsewhere classified: Secondary | ICD-10-CM | POA: Diagnosis not present

## 2020-04-21 DIAGNOSIS — M6281 Muscle weakness (generalized): Secondary | ICD-10-CM | POA: Diagnosis not present

## 2020-04-21 DIAGNOSIS — M13862 Other specified arthritis, left knee: Secondary | ICD-10-CM | POA: Diagnosis not present

## 2020-04-21 LAB — TSH: TSH: 2.37 (ref 0.41–5.90)

## 2020-04-21 LAB — BASIC METABOLIC PANEL
BUN: 35 — AB (ref 4–21)
CO2: 26 — AB (ref 13–22)
Chloride: 106 (ref 99–108)
Creatinine: 0.9 (ref 0.5–1.1)
Glucose: 99
Potassium: 4.1 (ref 3.4–5.3)
Sodium: 143 (ref 137–147)

## 2020-04-21 LAB — LIPID PANEL
Cholesterol: 170 (ref 0–200)
HDL: 71 — AB (ref 35–70)
LDL Cholesterol: 74
Triglycerides: 122 (ref 40–160)

## 2020-04-21 LAB — HEPATIC FUNCTION PANEL
ALT: 23 (ref 7–35)
AST: 18 (ref 13–35)
Alkaline Phosphatase: 77 (ref 25–125)
Bilirubin, Total: 0.4

## 2020-04-21 LAB — CBC AND DIFFERENTIAL
HCT: 41 (ref 36–46)
Hemoglobin: 13.3 (ref 12.0–16.0)
Platelets: 258 (ref 150–399)
WBC: 7.4

## 2020-04-21 LAB — COMPREHENSIVE METABOLIC PANEL
Albumin: 4 (ref 3.5–5.0)
Calcium: 9.1 (ref 8.7–10.7)

## 2020-04-21 LAB — CBC: RBC: 4.24 (ref 3.87–5.11)

## 2020-04-21 LAB — VITAMIN B12: Vitamin B-12: 527

## 2020-04-25 ENCOUNTER — Encounter: Payer: Self-pay | Admitting: Internal Medicine

## 2020-04-25 DIAGNOSIS — R488 Other symbolic dysfunctions: Secondary | ICD-10-CM | POA: Diagnosis not present

## 2020-04-25 DIAGNOSIS — R41841 Cognitive communication deficit: Secondary | ICD-10-CM | POA: Diagnosis not present

## 2020-04-26 DIAGNOSIS — Z741 Need for assistance with personal care: Secondary | ICD-10-CM | POA: Diagnosis not present

## 2020-04-26 DIAGNOSIS — N390 Urinary tract infection, site not specified: Secondary | ICD-10-CM | POA: Diagnosis not present

## 2020-04-26 DIAGNOSIS — M6281 Muscle weakness (generalized): Secondary | ICD-10-CM | POA: Diagnosis not present

## 2020-04-26 DIAGNOSIS — R4182 Altered mental status, unspecified: Secondary | ICD-10-CM | POA: Diagnosis not present

## 2020-04-26 DIAGNOSIS — R2681 Unsteadiness on feet: Secondary | ICD-10-CM | POA: Diagnosis not present

## 2020-04-26 DIAGNOSIS — R262 Difficulty in walking, not elsewhere classified: Secondary | ICD-10-CM | POA: Diagnosis not present

## 2020-04-26 DIAGNOSIS — M1990 Unspecified osteoarthritis, unspecified site: Secondary | ICD-10-CM | POA: Diagnosis not present

## 2020-04-26 DIAGNOSIS — N3090 Cystitis, unspecified without hematuria: Secondary | ICD-10-CM | POA: Diagnosis not present

## 2020-04-26 DIAGNOSIS — R279 Unspecified lack of coordination: Secondary | ICD-10-CM | POA: Diagnosis not present

## 2020-04-26 DIAGNOSIS — I639 Cerebral infarction, unspecified: Secondary | ICD-10-CM | POA: Diagnosis not present

## 2020-04-26 DIAGNOSIS — R3915 Urgency of urination: Secondary | ICD-10-CM | POA: Diagnosis not present

## 2020-04-26 DIAGNOSIS — M25561 Pain in right knee: Secondary | ICD-10-CM | POA: Diagnosis not present

## 2020-04-26 DIAGNOSIS — M13862 Other specified arthritis, left knee: Secondary | ICD-10-CM | POA: Diagnosis not present

## 2020-04-27 DIAGNOSIS — R2681 Unsteadiness on feet: Secondary | ICD-10-CM | POA: Diagnosis not present

## 2020-04-27 DIAGNOSIS — M13862 Other specified arthritis, left knee: Secondary | ICD-10-CM | POA: Diagnosis not present

## 2020-04-27 DIAGNOSIS — I639 Cerebral infarction, unspecified: Secondary | ICD-10-CM | POA: Diagnosis not present

## 2020-04-27 DIAGNOSIS — M1990 Unspecified osteoarthritis, unspecified site: Secondary | ICD-10-CM | POA: Diagnosis not present

## 2020-04-27 DIAGNOSIS — Z741 Need for assistance with personal care: Secondary | ICD-10-CM | POA: Diagnosis not present

## 2020-04-27 DIAGNOSIS — M6281 Muscle weakness (generalized): Secondary | ICD-10-CM | POA: Diagnosis not present

## 2020-04-27 DIAGNOSIS — R262 Difficulty in walking, not elsewhere classified: Secondary | ICD-10-CM | POA: Diagnosis not present

## 2020-04-27 DIAGNOSIS — M25561 Pain in right knee: Secondary | ICD-10-CM | POA: Diagnosis not present

## 2020-04-27 DIAGNOSIS — R279 Unspecified lack of coordination: Secondary | ICD-10-CM | POA: Diagnosis not present

## 2020-04-28 ENCOUNTER — Encounter: Payer: Self-pay | Admitting: Internal Medicine

## 2020-04-28 DIAGNOSIS — Z741 Need for assistance with personal care: Secondary | ICD-10-CM | POA: Diagnosis not present

## 2020-04-28 DIAGNOSIS — R2681 Unsteadiness on feet: Secondary | ICD-10-CM | POA: Diagnosis not present

## 2020-04-28 DIAGNOSIS — R279 Unspecified lack of coordination: Secondary | ICD-10-CM | POA: Diagnosis not present

## 2020-04-28 DIAGNOSIS — M6281 Muscle weakness (generalized): Secondary | ICD-10-CM | POA: Diagnosis not present

## 2020-04-28 DIAGNOSIS — M13862 Other specified arthritis, left knee: Secondary | ICD-10-CM | POA: Diagnosis not present

## 2020-04-28 DIAGNOSIS — M25561 Pain in right knee: Secondary | ICD-10-CM | POA: Diagnosis not present

## 2020-04-28 DIAGNOSIS — R262 Difficulty in walking, not elsewhere classified: Secondary | ICD-10-CM | POA: Diagnosis not present

## 2020-04-28 DIAGNOSIS — I639 Cerebral infarction, unspecified: Secondary | ICD-10-CM | POA: Diagnosis not present

## 2020-04-28 DIAGNOSIS — M1990 Unspecified osteoarthritis, unspecified site: Secondary | ICD-10-CM | POA: Diagnosis not present

## 2020-04-30 DIAGNOSIS — R41841 Cognitive communication deficit: Secondary | ICD-10-CM | POA: Diagnosis not present

## 2020-04-30 DIAGNOSIS — R488 Other symbolic dysfunctions: Secondary | ICD-10-CM | POA: Diagnosis not present

## 2020-05-02 DIAGNOSIS — M1990 Unspecified osteoarthritis, unspecified site: Secondary | ICD-10-CM | POA: Diagnosis not present

## 2020-05-02 DIAGNOSIS — R262 Difficulty in walking, not elsewhere classified: Secondary | ICD-10-CM | POA: Diagnosis not present

## 2020-05-02 DIAGNOSIS — R2681 Unsteadiness on feet: Secondary | ICD-10-CM | POA: Diagnosis not present

## 2020-05-02 DIAGNOSIS — M25561 Pain in right knee: Secondary | ICD-10-CM | POA: Diagnosis not present

## 2020-05-02 DIAGNOSIS — M6281 Muscle weakness (generalized): Secondary | ICD-10-CM | POA: Diagnosis not present

## 2020-05-03 DIAGNOSIS — M13862 Other specified arthritis, left knee: Secondary | ICD-10-CM | POA: Diagnosis not present

## 2020-05-03 DIAGNOSIS — Z741 Need for assistance with personal care: Secondary | ICD-10-CM | POA: Diagnosis not present

## 2020-05-03 DIAGNOSIS — R279 Unspecified lack of coordination: Secondary | ICD-10-CM | POA: Diagnosis not present

## 2020-05-03 DIAGNOSIS — M6281 Muscle weakness (generalized): Secondary | ICD-10-CM | POA: Diagnosis not present

## 2020-05-03 DIAGNOSIS — I639 Cerebral infarction, unspecified: Secondary | ICD-10-CM | POA: Diagnosis not present

## 2020-05-04 ENCOUNTER — Encounter: Payer: Self-pay | Admitting: *Deleted

## 2020-05-04 DIAGNOSIS — M1990 Unspecified osteoarthritis, unspecified site: Secondary | ICD-10-CM | POA: Diagnosis not present

## 2020-05-04 DIAGNOSIS — M25561 Pain in right knee: Secondary | ICD-10-CM | POA: Diagnosis not present

## 2020-05-04 DIAGNOSIS — M6281 Muscle weakness (generalized): Secondary | ICD-10-CM | POA: Diagnosis not present

## 2020-05-04 DIAGNOSIS — R2681 Unsteadiness on feet: Secondary | ICD-10-CM | POA: Diagnosis not present

## 2020-05-04 DIAGNOSIS — R41841 Cognitive communication deficit: Secondary | ICD-10-CM | POA: Diagnosis not present

## 2020-05-04 DIAGNOSIS — R262 Difficulty in walking, not elsewhere classified: Secondary | ICD-10-CM | POA: Diagnosis not present

## 2020-05-04 DIAGNOSIS — R488 Other symbolic dysfunctions: Secondary | ICD-10-CM | POA: Diagnosis not present

## 2020-05-05 ENCOUNTER — Other Ambulatory Visit: Payer: Self-pay

## 2020-05-05 ENCOUNTER — Ambulatory Visit
Admission: RE | Admit: 2020-05-05 | Discharge: 2020-05-05 | Disposition: A | Discharge status: Home / Self Care | Payer: Medicare Other | Source: Ambulatory Visit | Attending: Internal Medicine | Admitting: Internal Medicine

## 2020-05-05 DIAGNOSIS — R270 Ataxia, unspecified: Secondary | ICD-10-CM | POA: Diagnosis not present

## 2020-05-05 DIAGNOSIS — Z8679 Personal history of other diseases of the circulatory system: Secondary | ICD-10-CM

## 2020-05-05 DIAGNOSIS — R2681 Unsteadiness on feet: Secondary | ICD-10-CM

## 2020-05-05 DIAGNOSIS — G919 Hydrocephalus, unspecified: Secondary | ICD-10-CM

## 2020-05-05 DIAGNOSIS — F0280 Dementia in other diseases classified elsewhere without behavioral disturbance: Secondary | ICD-10-CM

## 2020-05-06 DIAGNOSIS — R41841 Cognitive communication deficit: Secondary | ICD-10-CM | POA: Diagnosis not present

## 2020-05-06 DIAGNOSIS — R488 Other symbolic dysfunctions: Secondary | ICD-10-CM | POA: Diagnosis not present

## 2020-05-08 ENCOUNTER — Other Ambulatory Visit: Payer: Self-pay | Admitting: Internal Medicine

## 2020-05-08 DIAGNOSIS — G919 Hydrocephalus, unspecified: Secondary | ICD-10-CM

## 2020-05-08 DIAGNOSIS — Z8679 Personal history of other diseases of the circulatory system: Secondary | ICD-10-CM

## 2020-05-08 DIAGNOSIS — R2681 Unsteadiness on feet: Secondary | ICD-10-CM

## 2020-05-08 DIAGNOSIS — F0280 Dementia in other diseases classified elsewhere without behavioral disturbance: Secondary | ICD-10-CM

## 2020-05-08 DIAGNOSIS — I6203 Nontraumatic chronic subdural hemorrhage: Secondary | ICD-10-CM

## 2020-05-08 NOTE — Progress Notes (Signed)
Sharon [name] has two areas where she had prior subdural hematomas (blood accumulating under the dura lining of the brain) that have what the radiologist says is either water or further bleeding present that's been going on for some time now.  The water tends to happen over time when there are areas of prior damage to the brain--the water fills in the spaces.   There is no increase in the amount of space in her ventricles which suggests that her shunt is working for the hydrocephalus.  The coil remains in place from when she had the aneurysm rupture years ago. I still recommend she follow up with neurosurgery.

## 2020-05-09 DIAGNOSIS — M13862 Other specified arthritis, left knee: Secondary | ICD-10-CM | POA: Diagnosis not present

## 2020-05-09 DIAGNOSIS — Z741 Need for assistance with personal care: Secondary | ICD-10-CM | POA: Diagnosis not present

## 2020-05-09 DIAGNOSIS — I639 Cerebral infarction, unspecified: Secondary | ICD-10-CM | POA: Diagnosis not present

## 2020-05-09 DIAGNOSIS — M6281 Muscle weakness (generalized): Secondary | ICD-10-CM | POA: Diagnosis not present

## 2020-05-09 DIAGNOSIS — R279 Unspecified lack of coordination: Secondary | ICD-10-CM | POA: Diagnosis not present

## 2020-05-10 DIAGNOSIS — Z741 Need for assistance with personal care: Secondary | ICD-10-CM | POA: Diagnosis not present

## 2020-05-10 DIAGNOSIS — M6281 Muscle weakness (generalized): Secondary | ICD-10-CM | POA: Diagnosis not present

## 2020-05-10 DIAGNOSIS — I639 Cerebral infarction, unspecified: Secondary | ICD-10-CM | POA: Diagnosis not present

## 2020-05-10 DIAGNOSIS — R279 Unspecified lack of coordination: Secondary | ICD-10-CM | POA: Diagnosis not present

## 2020-05-10 DIAGNOSIS — M13862 Other specified arthritis, left knee: Secondary | ICD-10-CM | POA: Diagnosis not present

## 2020-05-12 ENCOUNTER — Encounter: Payer: Self-pay | Admitting: Internal Medicine

## 2020-05-12 DIAGNOSIS — R2681 Unsteadiness on feet: Secondary | ICD-10-CM

## 2020-05-17 ENCOUNTER — Ambulatory Visit (INDEPENDENT_AMBULATORY_CARE_PROVIDER_SITE_OTHER): Payer: Medicare Other | Admitting: *Deleted

## 2020-05-17 ENCOUNTER — Other Ambulatory Visit: Payer: Self-pay

## 2020-05-17 DIAGNOSIS — R41841 Cognitive communication deficit: Secondary | ICD-10-CM | POA: Diagnosis not present

## 2020-05-17 DIAGNOSIS — R488 Other symbolic dysfunctions: Secondary | ICD-10-CM | POA: Diagnosis not present

## 2020-05-17 DIAGNOSIS — M810 Age-related osteoporosis without current pathological fracture: Secondary | ICD-10-CM | POA: Diagnosis not present

## 2020-05-17 MED ORDER — DENOSUMAB 60 MG/ML ~~LOC~~ SOSY
60.0000 mg | PREFILLED_SYRINGE | Freq: Once | SUBCUTANEOUS | Status: AC
  Administered 2020-05-17: 60 mg via SUBCUTANEOUS

## 2020-05-24 DIAGNOSIS — M1990 Unspecified osteoarthritis, unspecified site: Secondary | ICD-10-CM | POA: Diagnosis not present

## 2020-05-24 DIAGNOSIS — M6281 Muscle weakness (generalized): Secondary | ICD-10-CM | POA: Diagnosis not present

## 2020-05-24 DIAGNOSIS — R2681 Unsteadiness on feet: Secondary | ICD-10-CM | POA: Diagnosis not present

## 2020-05-24 DIAGNOSIS — I639 Cerebral infarction, unspecified: Secondary | ICD-10-CM | POA: Diagnosis not present

## 2020-05-24 DIAGNOSIS — M13862 Other specified arthritis, left knee: Secondary | ICD-10-CM | POA: Diagnosis not present

## 2020-05-24 DIAGNOSIS — R279 Unspecified lack of coordination: Secondary | ICD-10-CM | POA: Diagnosis not present

## 2020-05-24 DIAGNOSIS — M25561 Pain in right knee: Secondary | ICD-10-CM | POA: Diagnosis not present

## 2020-05-24 DIAGNOSIS — R262 Difficulty in walking, not elsewhere classified: Secondary | ICD-10-CM | POA: Diagnosis not present

## 2020-05-24 DIAGNOSIS — Z741 Need for assistance with personal care: Secondary | ICD-10-CM | POA: Diagnosis not present

## 2020-05-25 DIAGNOSIS — R2681 Unsteadiness on feet: Secondary | ICD-10-CM | POA: Diagnosis not present

## 2020-05-25 DIAGNOSIS — M6281 Muscle weakness (generalized): Secondary | ICD-10-CM | POA: Diagnosis not present

## 2020-05-25 DIAGNOSIS — M25561 Pain in right knee: Secondary | ICD-10-CM | POA: Diagnosis not present

## 2020-05-25 DIAGNOSIS — R262 Difficulty in walking, not elsewhere classified: Secondary | ICD-10-CM | POA: Diagnosis not present

## 2020-05-25 DIAGNOSIS — M1990 Unspecified osteoarthritis, unspecified site: Secondary | ICD-10-CM | POA: Diagnosis not present

## 2020-05-26 DIAGNOSIS — R279 Unspecified lack of coordination: Secondary | ICD-10-CM | POA: Diagnosis not present

## 2020-05-26 DIAGNOSIS — M13862 Other specified arthritis, left knee: Secondary | ICD-10-CM | POA: Diagnosis not present

## 2020-05-26 DIAGNOSIS — Z741 Need for assistance with personal care: Secondary | ICD-10-CM | POA: Diagnosis not present

## 2020-05-26 DIAGNOSIS — I639 Cerebral infarction, unspecified: Secondary | ICD-10-CM | POA: Diagnosis not present

## 2020-05-26 DIAGNOSIS — M6281 Muscle weakness (generalized): Secondary | ICD-10-CM | POA: Diagnosis not present

## 2020-05-30 DIAGNOSIS — R279 Unspecified lack of coordination: Secondary | ICD-10-CM | POA: Diagnosis not present

## 2020-05-30 DIAGNOSIS — Z741 Need for assistance with personal care: Secondary | ICD-10-CM | POA: Diagnosis not present

## 2020-05-30 DIAGNOSIS — M13862 Other specified arthritis, left knee: Secondary | ICD-10-CM | POA: Diagnosis not present

## 2020-05-30 DIAGNOSIS — M6281 Muscle weakness (generalized): Secondary | ICD-10-CM | POA: Diagnosis not present

## 2020-05-30 DIAGNOSIS — I639 Cerebral infarction, unspecified: Secondary | ICD-10-CM | POA: Diagnosis not present

## 2020-05-31 DIAGNOSIS — R279 Unspecified lack of coordination: Secondary | ICD-10-CM | POA: Diagnosis not present

## 2020-05-31 DIAGNOSIS — Z741 Need for assistance with personal care: Secondary | ICD-10-CM | POA: Diagnosis not present

## 2020-05-31 DIAGNOSIS — M13862 Other specified arthritis, left knee: Secondary | ICD-10-CM | POA: Diagnosis not present

## 2020-05-31 DIAGNOSIS — R2681 Unsteadiness on feet: Secondary | ICD-10-CM | POA: Diagnosis not present

## 2020-05-31 DIAGNOSIS — I639 Cerebral infarction, unspecified: Secondary | ICD-10-CM | POA: Diagnosis not present

## 2020-05-31 DIAGNOSIS — M6281 Muscle weakness (generalized): Secondary | ICD-10-CM | POA: Diagnosis not present

## 2020-05-31 DIAGNOSIS — M25561 Pain in right knee: Secondary | ICD-10-CM | POA: Diagnosis not present

## 2020-05-31 DIAGNOSIS — R262 Difficulty in walking, not elsewhere classified: Secondary | ICD-10-CM | POA: Diagnosis not present

## 2020-05-31 DIAGNOSIS — M1990 Unspecified osteoarthritis, unspecified site: Secondary | ICD-10-CM | POA: Diagnosis not present

## 2020-06-01 ENCOUNTER — Encounter: Payer: Self-pay | Admitting: Neurology

## 2020-06-01 ENCOUNTER — Telehealth: Payer: Self-pay | Admitting: Neurology

## 2020-06-01 ENCOUNTER — Ambulatory Visit (INDEPENDENT_AMBULATORY_CARE_PROVIDER_SITE_OTHER): Payer: Medicare Other | Admitting: Neurology

## 2020-06-01 VITALS — BP 124/80 | HR 88 | Ht 65.0 in | Wt 139.0 lb

## 2020-06-01 DIAGNOSIS — Z982 Presence of cerebrospinal fluid drainage device: Secondary | ICD-10-CM | POA: Diagnosis not present

## 2020-06-01 DIAGNOSIS — M21372 Foot drop, left foot: Secondary | ICD-10-CM

## 2020-06-01 DIAGNOSIS — F03918 Unspecified dementia, unspecified severity, with other behavioral disturbance: Secondary | ICD-10-CM | POA: Insufficient documentation

## 2020-06-01 DIAGNOSIS — I609 Nontraumatic subarachnoid hemorrhage, unspecified: Secondary | ICD-10-CM

## 2020-06-01 DIAGNOSIS — R7302 Impaired glucose tolerance (oral): Secondary | ICD-10-CM | POA: Diagnosis not present

## 2020-06-01 DIAGNOSIS — G629 Polyneuropathy, unspecified: Secondary | ICD-10-CM | POA: Diagnosis not present

## 2020-06-01 DIAGNOSIS — F0391 Unspecified dementia with behavioral disturbance: Secondary | ICD-10-CM

## 2020-06-01 NOTE — Progress Notes (Addendum)
[facility]  PATIENT: Sharon [name] DOB: 1934/11/27   REASON FOR VISIT: Follow-up for gait imbalance and cognitive decline, memory loss  HISTORY FROM: Patient's caretaker: 06-01-2020: In December , she was able to walk around the [facility] area in fast pace, and somehow all changed. Now  byumbing into things, and can hardly walk across the hallway.  Recently in the last 8-10 weeks noted a rapid decline. Her balance is affected, she became  became unstable and her memory declined along with that. They initially thought UTI and was negative. Here to address these concerns. She now lives at [place], with her [place] boyfriend. She calls her caretaker and insists that she needs to get her furniture from her " other apartment" referring to [place], her residence in 2016, when she suffered a brain aneurysm.  She is sometimes stating that there is "another [facility]" and " another apartment", another person impersonating her friends and  family.  She was prescribed PT and her therapist asked her to see neurology. Has OT, too.  6 years ago there was a fixation with stool, with parasites that's were never found in several stool tests.    11-14-2016 I had the pleasure of meeting Sharon [name] and her daughter-in-law today on 11/14/2016, the patient is here for routine revisit with memory testing she performed today well on a Montral cognitive assessment with a final count of 25 points out of 30 possible points. Geriatric depression scale was endorsed at 2 out of 15 points. 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    Sharon [name] is here for a routine revisit from 05/17/2015. She has been on Nicotrol patches trying to quit smoking and hasn't had a cigarette in over a month. She reports no confusion she had an auto fall in generally in the backyard of her home and her grandson brought her to the emergency room were no abnormalities were  found no infection or metabolic abnormality no x-ray injuries. She has not been hospitalized. She is driving. She is working and her own shop. She went to an antique fair in [place], [place].  She is independent in all activities of daily living reveals today a marker test and she scored 25 out of 30 points with 13  Points on word fluency test results.  This is to points better than her last test results from January. She is sleeping well her appetite is good, she is taking yoga classes.  She is willing to move to [facility] and planning her transition. She has a close friend there and is looking forward to his company.  She appears perfectly well adjusted . I would not refrain her from driving. I will follow up q 6 month with MOCA and MMSE.    03-10-15 ,CM Sharon [name], 86 year old female returns for follow-up. She was last seen 08/16/14.  She has a history of memory loss and nontraumatic subarachnoid hemorrhage. Her  Aricept was reduced to 5 mg daily at that visit due to diarrhea. Since that time she called back in expected Dr. Brett Fairy on 10/25/2014 who told her to stop the Aricept altogether and began a Namenda starter pack which she picked up from her pharmacy and took the first month. According to her pharmacy today her refills have never been picked up. She reports that her memory is stable. She no longer has caregiver in the home. She is back to driving and has not had any problems.  MOCA test today 23 out of 30. Last was 26 out of 30 .review She reports that her appetite is good and she is sleeping well. She is taking yoga .She returns for reevaluation   UPDATE 4-4-16CD The meeting today for a prolonged revisit appointment of 30 minutes. Sharon [name] is here today with a caretaker, [name], and appears very alert and concentrated. She is reporting depression, but also hope, She has reported diarrhea, and suspects that either her bacterial setting is disturbed from ATB , or she may have contracted clostridium  . Be repeated today and MMSE that the patient had already performed the last time at 23 out of 30 points today she scored 27 out of 30 points. She recalled 2 day 2 of the 3 words that she missed last time was able to do the serial sevens completely she was not sure about the season and also the spring she stated it was fall. She was able to copy an object and clock draw a clock face correctly she named 9 animals in the animal fluency test and she wrote a perfect sentence writing. Over 2 to make it a little bit more difficult she scored 25 out of 30 on the much more difficult Montral assessment test.I also have a transcranial Doppler study available here as was normal as flow direction and velocity in all identified vessels in expected sinus no evidence of stenosis basilar spasm or occlusion. The patient fell asleep doing the test and the technician noted that she was snoring. Her VP shunt performs well. She is much improved.  As she has no longer confusion, she keeps active in conversation and is interested in daily activities. I will ask her to come back in June and likely will give her permission d to drive if her recovery is sustained. I would like her to try the Ritalin that she was prescribed in [facility]. I will increase aricept to 10 mg now form 5 mg.   HISTORY OF PRESENT ILLNESS::Sharon [name] and had a very eventful end of the last year . He was suspected to have a UTI causing confusion, and was admitted on 02-28-14 , given multiple doses of ativan - Her agitation got worse. The attempt to do a lumbar puncture was unsuccessful. She needed sedation for an MRI, which did showed an aneurysm. Under the MRI sedation a LP was finally done, the CSF was cloudy. 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 She was coiled by Dr. Estanislado Pandy .  [name]- but a communicating hydrocephalus  developed nonetheless, and a VP shunt was placed on Jan. 11th 2016 by Dr. Trenton Gammon. Since the aneurysm bleed occurred the patient also developed a urinary tract infection indeed and 100,000 colonies were seen on 04/11/2014 in her urinary sample that was obtained while she was in rehabilitation at [facility]. The main concern now is that this 86 year old right-handed Caucasian lady has still continued to have some altered mental status. She is not quite as concentrated and focused and she is repetitive.   The patient used to live independently prior to December 2015. She was referred referred from hospital to come in place for rehabilitation per physical and occupational therapy were ongoing. She was placed on the more top to prevent muscle spasms. Decadron protocol was directed. In generally the patient still had some low-grade fevers that maintained on broad-spectrum antibiotics these stopped. She tends to confabulate during the exam and  she has mild left-sided dysmetria and ataxia clearly related to the right-sided lesion. She is easily distracted. She was placed on Ritalin at bedtime to help improve anytime arousal focus attention and issues in regards to mental status. She is developed insomnia which is very common after an aneurysm bleed. She sometimes restless but she is doing much better than she has a visitor that she recognizes and trusts. She is currently on Pepcid, on Ritalin, Zocor at bedtime only. She was on a mechanical soft diet and thin liquids. Diet changed during her [place] [facility] rehabilitation stay to a regular diet. I was able to review the patient's recent lab results from 04-13-14 showed a normal CBC and differential her platelet count was 463,000. Her urinalysis confirmed 2 days after the beginning of Macrobid only few bacteria. Hepatic metabolic panel was entirely normal including creatinine and potassium and sodium. I did not see a TSH. However the TSH would unlikely be affected with and only 6  weeks after an aneurysm bleed. I will refer the patient also for transcranial Doppler study to make sure that there is no muscle spasm at the current time. She does not complain about headaches right now. Neither the patient nor her daughter confirm any nausea the last couple of days. She has no neck stiffness.    REVIEW OF SYSTEMS: Full 14 system review of systems performed and notable only for those listed, all others are neg:  Sharon [name] reports no recent confusional episodes, she does not appear depressed and her geriatric depression score is endorsed at only 2 points, she scored fine in her Montral cognitive assessment test at 25 out of 30 points. She missed to recall words areas she was fine in trail making, could draw a cube and a clock face visual spatial and orientation were all intact. I would like for her to leave a stool sample today take your take the tube with her to rule out that her diarrhea is related to an infection and perhaps not to medication at all.  ALLERGIES: Allergies  Allergen Reactions  . Aricept [Donepezil Hcl] Diarrhea    HOME MEDICATIONS: Outpatient Medications Prior to Visit  Medication Sig Dispense Refill  . atorvastatin (LIPITOR) 20 MG tablet TAKE 1 TABLET EVERY DAY 90 tablet 1  . Calcium Carbonate (CALCIUM 600 PO) Take 1 tablet by mouth daily.     . Cholecalciferol (VITAMIN D) 50 MCG (2000 UT) CAPS Take 1 capsule (2,000 Units total) by mouth daily. 90 capsule 3  . denosumab (PROLIA) 60 MG/ML SOLN injection Inject 60 mg into the skin every 6 (six) months. Administer in upper arm, thigh, or abdomen    . Melatonin 5 MG CAPS Take 1 capsule (5 mg total) by mouth at bedtime as needed. 28 capsule 6  . memantine (NAMENDA) 10 MG tablet TAKE 1 TABLET TWICE DAILY 180 tablet 1  . senna (SENOKOT) 8.6 MG TABS tablet Take 1 tablet (8.6 mg total) by mouth daily as needed for mild constipation. 90 tablet 3  . sertraline (ZOLOFT) 50 MG tablet TAKE 1 TABLET EVERY DAY 90 tablet  1  . valACYclovir (VALTREX) 1000 MG tablet Take 1 tablet (1,000 mg total) by mouth 3 (three) times daily. 21 tablet 0   No facility-administered medications prior to visit.    PAST MEDICAL HISTORY: Past Medical History:  Diagnosis Date  . Aneurysm ([place])   . Arthritis    osteo  . Cystitis 03/02/2014   acute   . Dyslipidemia   . Hearing  loss bil   got hearing aides.     PAST SURGICAL HISTORY: Past Surgical History:  Procedure Laterality Date  . ABDOMINAL HYSTERECTOMY    . RADIOLOGY WITH ANESTHESIA N/A 03/04/2014   Procedure: RADIOLOGY WITH ANESTHESIA;  Surgeon: Rob Hickman, MD;  Location: [facility];  Service: Radiology;  Laterality: N/A;  . RADIOLOGY WITH ANESTHESIA N/A 03/04/2014   Procedure: RADIOLOGY WITH ANESTHESIA;  Surgeon: Medication Radiologist, MD;  Location: [facility];  Service: Radiology;  Laterality: N/A;  . VENTRICULOPERITONEAL SHUNT Right 03/15/2014   Procedure: SHUNT INSERTION VENTRICULAR-PERITONEAL;  Surgeon: Charlie Pitter, MD;  Location: [facility];  Service: Neurosurgery;  Laterality: Right;    FAMILY HISTORY: Family History  Problem Relation Age of Onset  . Hypertension Mother   . Hypertension Father     SOCIAL HISTORY: Social History   Socioeconomic History  . Marital status: Widowed    Spouse name: Not on file  . Number of children: 4  . Years of education: hs grad  . Highest education level: Not on file  Occupational History  . Not on file  Tobacco Use  . Smoking status: Former Smoker    Packs/day: 0.50    Types: Cigarettes    Quit date: 03/05/2014    Years since quitting: 6.2  . Smokeless tobacco: Never Used  Vaping Use  . Vaping Use: Never used  Substance and Sexual Activity  . Alcohol use: Not Currently    Alcohol/week: 0.0 standard drinks    Comment: recovering alcoholic- started drinking 2 weeks ago (07-04-14), stated not drinking. 08-16-14  . Drug use: No  . Sexual activity: Not on file  Other Topics Concern  . Not on file  Social  History Narrative   Caffeine 1 cup am avg.  Lives at [place].     Social Determinants of Health   Financial Resource Strain: Not on file  Food Insecurity: Not on file  Transportation Needs: Not on file  Physical Activity: Not on file  Stress: Not on file  Social Connections: Not on file  Intimate Partner Violence: Not on file     PHYSICAL EXAM  Vitals:   06/01/20 1327  BP: 124/80  Pulse: 88  Weight: 139 lb (63 kg)  Height: 5\' 5"  (1.651 m)   Body mass index is 23.13 kg/m.   Generalized: Well developed, well groomed in no acute distress  Head: normocephalic and atraumatic. Oropharynx benign  Neck: Supple, no carotid bruits  Cardiac: Regular rate rhythm, no murmur  Musculoskeletal: No deformity   Neurological examination;  Mentation: not alert -neither oriented to time, place, and unable to get any history .  Attention span and concentration span - absent    [facility] Cognitive Assessment  11/14/2016 05/21/2016 11/21/2015 05/17/2015 02/15/2015  Visuospatial/ Executive (0/5) 4 5 5 5 4   Naming (0/3) 3 3 3 3 3   Attention: Read list of digits (0/2) 2 2 1 1 1   Attention: Read list of letters (0/1) 1 1 1 1 1   Attention: Serial 7 subtraction starting at 100 (0/3) 2 2 1 2 3   Language: Repeat phrase (0/2) 2 2 2 2 2   Language : Fluency (0/1) 1 0 1 1 0  Abstraction (0/2) 1 1 2 2 1   Delayed Recall (0/5) 3 3 2 3 2   Orientation (0/6) 6 6 6 5 6   Total 25 25 24 25 23   Adjusted Score (based on education) - - 24 25 23    MMSE - Mini Mental State Exam 06/01/2020 04/20/2014  Orientation to time 4 4  Orientation to Place 4 4  Registration 3 3  Attention/ Calculation 2 3  Recall 1 0  Language- name 2 objects 2 2  Language- repeat 1 1  Language- follow 3 step command 3 3  Language- read & follow direction 1 1  Write a sentence 1 1  Copy design 1 1  Total score 23 23      Cranial nerve :  Pupils were equal in size, status post cataract surgery-  round but not reactive to  light. The  extraocular movements were full, visual field were full on confrontational test. Diplopia  Paroxysmally- horizontal diplopia with resolution-  Facial sensation and strength were normal. hearing was impaired bilaterally.  Uvula and tongue midline. head turning and shoulder shrug were normal and symmetric. Tongue protrusion into cheek strength was normal. Motor: normal bulk and tone, drift in left upper extremity,  with supination and gait with " slapping"  left foot drop.   fine finger movements normal, dropping objects- right hand grip is weaker. She dropped plate with food.  Sensory:  Vibration not felt in either knee and not in left foot ankle, shin.   Coordination: finger-nose-with dysmetria- no tremor , just off target.  Reflexes: 1 plus.  Gait and Station: Rising up from seated position without assistance, bracing.  Wide based  stance, wide based gait, with left foot drop , fragmented 6 step- turns. Reduced arm swing, unable to perform tiptoe, and heel walking .  Wall surfing, furniture surfing.  Tandem gait not attempted, drifting to the left and right. Very ataxic.   DIAGNOSTIC DATA (LABS, IMAGING, TESTING)  : 1. The ventricles are slightly increased in size in comparison to the prior, but remain decompressed without specific evidence of hydrocephalus. 2. Slight interval increase in mildly hyperdense bilateral subdural collections which measure up to approximately 6 mm in thickness bilaterally. These may represent chronic subdural hemorrhages versus complex hygromas. No substantial mass effect or midline shift. Recommend correlation with evidence of over shunting. 3. Right posterior communicating artery region aneurysm coil.  These results will be called to the ordering clinician or representative by the Radiologist Assistant, and communication documented in the PACS or [employer].    Lab Results  Component Value Date   WBC 7.4 04/21/2020   HGB 13.3  04/21/2020   HCT 41 04/21/2020   MCV 98.1 03/24/2015   PLT 258 04/21/2020      Component Value Date/Time   NA 143 04/21/2020 0000   K 4.1 04/21/2020 0000   CL 106 04/21/2020 0000   CO2 26 (A) 04/21/2020 0000   GLUCOSE 140 (H) 03/24/2015 1009   BUN 35 (A) 04/21/2020 0000   CREATININE 0.9 04/21/2020 0000   CREATININE 0.87 03/24/2015 1009   CALCIUM 9.1 04/21/2020 0000   PROT 6.8 03/24/2015 1009   ALBUMIN 4.0 04/21/2020 0000   AST 18 04/21/2020 0000   ALT 23 04/21/2020 0000   ALKPHOS 77 04/21/2020 0000   BILITOT 0.6 03/24/2015 1009   GFRNONAA >60 03/24/2015 1009   GFRAA >60 03/24/2015 1009   Lab Results  Component Value Date   CHOL 170 04/21/2020   HDL 71 (A) 04/21/2020   LDLCALC 74 04/21/2020   TRIG 122 04/21/2020   CHOLHDL 2.5 03/10/2014    Lab Results  Component Value Date   VITAMINB12 527 04/21/2020   Lab Results  Component Value Date   TSH 2.37 04/21/2020      ASSESSMENT  60 minutes -  gait, sensory, memory, eye movements.  86 y.o. 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  She has been seen yearly at [facility] in [place], [place]. [facility].  They prescribed Namenda. [phone]. She was placed on a Namenda starter pack and never picked up the refill medication.  She was in 2016  back to driving. 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 She was sober and recovering for almost 3 decades.  She has moved to [employer]. She has dinner at [facility] nightly, feels in good company.  There was a sudden change in cognitive function and balance- this is dementia, very impaired short term memory, Imposter syndrome. Forgetting conversations and names all the time, not able to care for herself -  she has private assistance while remaining  living in the independent section.   Assessment and Plan :  1) Severe Ataxia of gait , also with neuropathy- affecting  left more than right lower extremity, and  with weakness- foot drop on the left, pronator drift on the left.   2) severe hearing loss- hearing aids not helping that much.   3) unfocussed and unable to recall our discussion within 3 minutes of concluding it.  MMSE was not that much lower.   4) MRI brain - with and without contrast. Thiamine, Vit D,     RV in 3 month.     Larey Seat, MD  [facility] [address] [place], [place] [address] [phone]

## 2020-06-01 NOTE — Patient Instructions (Signed)
Dementia Dementia is a condition that affects the way the brain functions. It often affects memory and thinking. Usually, dementia gets worse with time and cannot be reversed (progressive dementia). There are many types of dementia, including:  Alzheimer's disease. This type is the most common.  Vascular dementia. This type may happen as the result of a stroke.  Lewy body dementia. This type may happen to people who have Parkinson's disease.  Frontotemporal dementia. This type is caused by damage to nerve cells (neurons) in certain parts of the brain. Some people may be affected by more than one type of dementia. This is called mixed dementia. What are the causes? Dementia is caused by damage to cells in the brain. The area of the brain and the types of cells damaged determine the type of dementia. Usually, this damage is irreversible or cannot be undone. Some examples of irreversible causes include:  Conditions that affect the blood vessels of the brain, such as diabetes, heart disease, or blood vessel disease.  Genetic mutations. In some cases, changes in the brain may be caused by another condition and can be reversed or slowed. Some examples of reversible causes include:  Injury to the brain.  Certain medicines.  Infection, such as meningitis.  Metabolic problems, such as vitamin B12 deficiency or thyroid disease.  Pressure on the brain, such as from a tumor, blood clot, or too much fluid in the brain (hydrocephalus).  Autoimmune diseases that affect the brain or arteries, such as limbic encephalitis or vasculitis. What are the signs or symptoms? Symptoms of dementia depend on the type of dementia. Common signs of dementia include problems with remembering, thinking, problem solving, decision making, and communicating. These signs develop slowly or get worse with time. This may include:  Problems remembering events or people.  Having trouble taking a bath or putting clothes  on.  Forgetting appointments or forgetting to pay bills.  Difficulty planning and preparing meals.  Having trouble speaking.  Getting lost easily.  Changes in behavior or mood. How is this diagnosed? This condition is diagnosed by a specialist (neurologist). It is diagnosed based on the history of your symptoms, your medical history, a physical exam, and tests. Tests may include:  Tests to evaluate brain function, such as memory tests, cognitive tests, and other tests.  Lab tests, such as blood or urine tests.  Imaging tests, such as a CT scan, a PET scan, or an MRI.  Genetic testing. This may be done if other family members have a diagnosis of certain types of dementia. Your health care provider will talk with you and your family, friends, or caregivers about your history and symptoms.   How is this treated? Treatment for this condition depends on the cause of the dementia. Progressive dementias, such as Alzheimer's disease, cannot be cured, but there may be treatments that help to manage symptoms. Treatment might involve taking medicines that may help to:  Control the dementia.  Slow down the progression of the dementia.  Manage symptoms. In some cases, treating the cause of your dementia can improve symptoms, reverse symptoms, or slow down how quickly your dementia becomes worse. Your health care provider can direct you to support groups, organizations, and other health care providers who can help with decisions about your care. Follow these instructions at home: Medicines  Take over-the-counter and prescription medicines only as told by your health care provider.  Use a pill organizer or pill reminder to help you manage your medicines.  Avoid taking  medicines that can affect thinking, such as pain medicines or sleeping medicines. Lifestyle  Make healthy lifestyle choices. ? Be physically active as told by your health care provider. ? Do not use any products that  contain nicotine or tobacco, such as cigarettes, e-cigarettes, and chewing tobacco. If you need help quitting, ask your health care provider. ? Do not drink alcohol. ? Practice stress-management techniques when you get stressed. ? Spend time with other people.  Make sure to get quality sleep. These tips can help you get a good night's rest: ? Avoid napping during the day. ? Keep your sleeping area dark and cool. ? Avoid exercising during the few hours before you go to bed. ? Avoid caffeine products in the evening. Eating and drinking  Drink enough fluid to keep your urine pale yellow.  Eat a healthy diet. General instructions  Work with your health care provider to determine what you need help with and what your safety needs are.  Talk with your health care provider about whether it is safe for you to drive.  If you were given a bracelet that identifies you as a person with memory loss or tracks your location, make sure to wear it at all times.  Work with your family to make important decisions, such as advance directives, medical power of attorney, or a living will.  Keep all follow-up visits. This is important.   Where to find more information  Alzheimer's Association: [employer].co.nz  National Institute on Aging: DVDEnthusiasts.nl  World Health Organization: RoleLink.com.br Contact a health care provider if:  You have any new or worsening symptoms.  You have problems with choking or swallowing. Get help right away if:  You feel depressed or sad, or feel that you want to harm yourself.  Your family members become concerned for your safety. If you ever feel like you may hurt yourself or others, or have thoughts about taking your own life, get help right away. Go to your nearest emergency department or:  Call your local emergency services (911 in the [place]  Call a suicide crisis helpline, such as the [facility] at 573-093-4626. This is open  24 hours a day in the [place]  Text the Crisis Text Line at 669-135-6587 (in the [place].). Summary  Dementia is a condition that affects the way the brain functions. Dementia often affects memory and thinking.  Usually, dementia gets worse with time and cannot be reversed (progressive dementia).  Treatment for this condition depends on the cause of the dementia.  Work with your health care provider to determine what you need help with and what your safety needs are.  Your health care provider can direct you to support groups, organizations, and other health care providers who can help with decisions about your care. This information is not intended to replace advice given to you by your health care provider. Make sure you discuss any questions you have with your health care provider. Document Revised: 07/06/2019 Document Reviewed: 07/06/2019 Elsevier Patient Education  [place].

## 2020-06-01 NOTE — Telephone Encounter (Signed)
Medicare/bcbs supp order sent to [facility]. No auth they will reach out to the patient to schedule.  

## 2020-06-02 ENCOUNTER — Encounter: Payer: Self-pay | Admitting: *Deleted

## 2020-06-02 DIAGNOSIS — G910 Communicating hydrocephalus: Secondary | ICD-10-CM | POA: Diagnosis not present

## 2020-06-02 NOTE — Progress Notes (Signed)
Normal CBC ( no anemia) , normal sed rate (inflammatory marker ) . All other tests are still pending

## 2020-06-03 NOTE — Telephone Encounter (Signed)
Hi Dr.Gupta, you will need to consult with one of the providers on placing orders, I have pended the order for [facility], once you associate and sign order it will print here and we will need to discuss how you will sign order and where to send order

## 2020-06-03 NOTE — Addendum Note (Signed)
Addended by: Logan Bores on: 06/03/2020 03:24 PM   Modules accepted: Orders

## 2020-06-03 NOTE — Telephone Encounter (Signed)
I have emailed order to Rod[facility] Piety to print and give to Dr.Gupta to sign on Monday

## 2020-06-03 NOTE — Addendum Note (Signed)
Addended by: Georgina Snell on: 06/03/2020 03:52 PM   Modules accepted: Orders

## 2020-06-06 NOTE — Progress Notes (Signed)
No anemia , normal blood cell count, ANA negative , normal sed rate - E phoresis and thiamine is still pending.

## 2020-06-07 ENCOUNTER — Telehealth: Payer: Self-pay | Admitting: Neurology

## 2020-06-07 DIAGNOSIS — M6281 Muscle weakness (generalized): Secondary | ICD-10-CM | POA: Diagnosis not present

## 2020-06-07 DIAGNOSIS — Z741 Need for assistance with personal care: Secondary | ICD-10-CM | POA: Diagnosis not present

## 2020-06-07 DIAGNOSIS — R2681 Unsteadiness on feet: Secondary | ICD-10-CM | POA: Diagnosis not present

## 2020-06-07 DIAGNOSIS — R279 Unspecified lack of coordination: Secondary | ICD-10-CM | POA: Diagnosis not present

## 2020-06-07 DIAGNOSIS — M13862 Other specified arthritis, left knee: Secondary | ICD-10-CM | POA: Diagnosis not present

## 2020-06-07 DIAGNOSIS — R262 Difficulty in walking, not elsewhere classified: Secondary | ICD-10-CM | POA: Diagnosis not present

## 2020-06-07 DIAGNOSIS — M1990 Unspecified osteoarthritis, unspecified site: Secondary | ICD-10-CM | POA: Diagnosis not present

## 2020-06-07 DIAGNOSIS — M25561 Pain in right knee: Secondary | ICD-10-CM | POA: Diagnosis not present

## 2020-06-07 DIAGNOSIS — I639 Cerebral infarction, unspecified: Secondary | ICD-10-CM | POA: Diagnosis not present

## 2020-06-07 NOTE — Progress Notes (Signed)
Normal serum protein phoresis.

## 2020-06-07 NOTE — Telephone Encounter (Signed)
Will take caretaker, Ms. Hahn listed on DPR.  Advised of the lab findings.  Instructed that so far everything has been in normal range.  Advised if the thiamine came back abnormal I would give her a call.  She was appreciative for the call and had no further questions

## 2020-06-07 NOTE — Telephone Encounter (Signed)
Order written for PT and OT to evaluate and Treat  Order signed for the [facility] Faxed to [facility] Fax: [phone]

## 2020-06-07 NOTE — Telephone Encounter (Signed)
-----   Message from Larey Seat, MD sent at 06/06/2020  2:50 PM EDT ----- No anemia , normal blood cell count, ANA negative , normal sed rate - E phoresis and thiamine is still pending.

## 2020-06-08 ENCOUNTER — Encounter: Payer: Self-pay | Admitting: Internal Medicine

## 2020-06-09 DIAGNOSIS — I639 Cerebral infarction, unspecified: Secondary | ICD-10-CM | POA: Diagnosis not present

## 2020-06-09 DIAGNOSIS — Z741 Need for assistance with personal care: Secondary | ICD-10-CM | POA: Diagnosis not present

## 2020-06-09 DIAGNOSIS — M13862 Other specified arthritis, left knee: Secondary | ICD-10-CM | POA: Diagnosis not present

## 2020-06-09 DIAGNOSIS — M6281 Muscle weakness (generalized): Secondary | ICD-10-CM | POA: Diagnosis not present

## 2020-06-09 DIAGNOSIS — R279 Unspecified lack of coordination: Secondary | ICD-10-CM | POA: Diagnosis not present

## 2020-06-14 DIAGNOSIS — M25561 Pain in right knee: Secondary | ICD-10-CM | POA: Diagnosis not present

## 2020-06-14 DIAGNOSIS — Z741 Need for assistance with personal care: Secondary | ICD-10-CM | POA: Diagnosis not present

## 2020-06-14 DIAGNOSIS — M13862 Other specified arthritis, left knee: Secondary | ICD-10-CM | POA: Diagnosis not present

## 2020-06-14 DIAGNOSIS — R262 Difficulty in walking, not elsewhere classified: Secondary | ICD-10-CM | POA: Diagnosis not present

## 2020-06-14 DIAGNOSIS — M1990 Unspecified osteoarthritis, unspecified site: Secondary | ICD-10-CM | POA: Diagnosis not present

## 2020-06-14 DIAGNOSIS — M6281 Muscle weakness (generalized): Secondary | ICD-10-CM | POA: Diagnosis not present

## 2020-06-14 DIAGNOSIS — R279 Unspecified lack of coordination: Secondary | ICD-10-CM | POA: Diagnosis not present

## 2020-06-14 DIAGNOSIS — I639 Cerebral infarction, unspecified: Secondary | ICD-10-CM | POA: Diagnosis not present

## 2020-06-14 DIAGNOSIS — R2681 Unsteadiness on feet: Secondary | ICD-10-CM | POA: Diagnosis not present

## 2020-06-14 LAB — MULTIPLE MYELOMA PANEL, SERUM
Albumin SerPl Elph-Mcnc: 3.8 g/dL (ref 2.9–4.4)
Albumin/Glob SerPl: 1.4 (ref 0.7–1.7)
Alpha 1: 0.3 g/dL (ref 0.0–0.4)
Alpha2 Glob SerPl Elph-Mcnc: 0.8 g/dL (ref 0.4–1.0)
B-Globulin SerPl Elph-Mcnc: 1 g/dL (ref 0.7–1.3)
Gamma Glob SerPl Elph-Mcnc: 0.8 g/dL (ref 0.4–1.8)
Globulin, Total: 2.8 g/dL (ref 2.2–3.9)
IgA/Immunoglobulin A, Serum: 184 mg/dL (ref 64–422)
IgG (Immunoglobin G), Serum: 829 mg/dL (ref 586–1602)
IgM (Immunoglobulin M), Srm: 50 mg/dL (ref 26–217)
Total Protein: 6.6 g/dL (ref 6.0–8.5)

## 2020-06-14 LAB — CBC WITH DIFFERENTIAL/PLATELET
Basophils Absolute: 0.1 10*3/uL (ref 0.0–0.2)
Basos: 1 %
EOS (ABSOLUTE): 0.1 10*3/uL (ref 0.0–0.4)
Eos: 1 %
Hematocrit: 42.4 % (ref 34.0–46.6)
Hemoglobin: 14 g/dL (ref 11.1–15.9)
Immature Grans (Abs): 0 10*3/uL (ref 0.0–0.1)
Immature Granulocytes: 0 %
Lymphocytes Absolute: 1.7 10*3/uL (ref 0.7–3.1)
Lymphs: 19 %
MCH: 31.4 pg (ref 26.6–33.0)
MCHC: 33 g/dL (ref 31.5–35.7)
MCV: 95 fL (ref 79–97)
Monocytes Absolute: 0.8 10*3/uL (ref 0.1–0.9)
Monocytes: 9 %
Neutrophils Absolute: 6.2 10*3/uL (ref 1.4–7.0)
Neutrophils: 70 %
Platelets: 283 10*3/uL (ref 150–450)
RBC: 4.46 x10E6/uL (ref 3.77–5.28)
RDW: 12.4 % (ref 11.7–15.4)
WBC: 9 10*3/uL (ref 3.4–10.8)

## 2020-06-14 LAB — ANA W/REFLEX: ANA Titer 1: NEGATIVE

## 2020-06-14 LAB — C-REACTIVE PROTEIN: CRP: <1 mg/L (ref 0–10)

## 2020-06-14 LAB — SEDIMENTATION RATE: Sed Rate: 3 mm/hr (ref 0–40)

## 2020-06-14 LAB — SJOGREN'S SYNDROME ANTIBODS(SSA + SSB)
ENA SSA (RO) Ab: <0.2 AI (ref 0.0–0.9)
ENA SSB (LA) Ab: <0.2 AI (ref 0.0–0.9)

## 2020-06-14 LAB — RPR: RPR Ser Ql: NONREACTIVE

## 2020-06-14 LAB — COPPER, SERUM: Copper: 101 ug/dL (ref 80–158)

## 2020-06-14 LAB — VITAMIN B1: Thiamine: 187.6 nmol/L (ref 66.5–200.0)

## 2020-06-15 DIAGNOSIS — M1990 Unspecified osteoarthritis, unspecified site: Secondary | ICD-10-CM | POA: Diagnosis not present

## 2020-06-15 DIAGNOSIS — R262 Difficulty in walking, not elsewhere classified: Secondary | ICD-10-CM | POA: Diagnosis not present

## 2020-06-15 DIAGNOSIS — M6281 Muscle weakness (generalized): Secondary | ICD-10-CM | POA: Diagnosis not present

## 2020-06-15 DIAGNOSIS — M25561 Pain in right knee: Secondary | ICD-10-CM | POA: Diagnosis not present

## 2020-06-15 DIAGNOSIS — R2681 Unsteadiness on feet: Secondary | ICD-10-CM | POA: Diagnosis not present

## 2020-06-21 DIAGNOSIS — M25561 Pain in right knee: Secondary | ICD-10-CM | POA: Diagnosis not present

## 2020-06-21 DIAGNOSIS — R262 Difficulty in walking, not elsewhere classified: Secondary | ICD-10-CM | POA: Diagnosis not present

## 2020-06-21 DIAGNOSIS — M6281 Muscle weakness (generalized): Secondary | ICD-10-CM | POA: Diagnosis not present

## 2020-06-21 DIAGNOSIS — R2681 Unsteadiness on feet: Secondary | ICD-10-CM | POA: Diagnosis not present

## 2020-06-21 DIAGNOSIS — M1990 Unspecified osteoarthritis, unspecified site: Secondary | ICD-10-CM | POA: Diagnosis not present

## 2020-06-23 ENCOUNTER — Ambulatory Visit (INDEPENDENT_AMBULATORY_CARE_PROVIDER_SITE_OTHER): Payer: Medicare Other | Admitting: Diagnostic Neuroimaging

## 2020-06-23 ENCOUNTER — Encounter (INDEPENDENT_AMBULATORY_CARE_PROVIDER_SITE_OTHER): Payer: Medicare Other | Admitting: Diagnostic Neuroimaging

## 2020-06-23 DIAGNOSIS — M21372 Foot drop, left foot: Secondary | ICD-10-CM

## 2020-06-23 DIAGNOSIS — G629 Polyneuropathy, unspecified: Secondary | ICD-10-CM | POA: Diagnosis not present

## 2020-06-23 DIAGNOSIS — F0391 Unspecified dementia with behavioral disturbance: Secondary | ICD-10-CM

## 2020-06-23 DIAGNOSIS — Z0289 Encounter for other administrative examinations: Secondary | ICD-10-CM

## 2020-06-23 DIAGNOSIS — Z982 Presence of cerebrospinal fluid drainage device: Secondary | ICD-10-CM

## 2020-06-23 DIAGNOSIS — I609 Nontraumatic subarachnoid hemorrhage, unspecified: Secondary | ICD-10-CM

## 2020-06-23 DIAGNOSIS — R7302 Impaired glucose tolerance (oral): Secondary | ICD-10-CM

## 2020-06-23 NOTE — Procedures (Signed)
[facility]  NCS (NERVE CONDUCTION STUDY) WITH EMG (ELECTROMYOGRAPHY) REPORT   STUDY DATE: 06/23/20 PATIENT NAME: Sharon [name] DOB: 10-10-1934 MRN: [id number]  ORDERING CLINICIAN: Larey Seat, MD   TECHNOLOGIST: Sherre Scarlet ELECTROMYOGRAPHER: Earlean Polka. Tajia Szeliga, MD  CLINICAL INFORMATION: 86 year old female with gait difficulty and numbness.  FINDINGS: NERVE CONDUCTION STUDY:  Bilateral peroneal and tibial motor responses are normal.  Right sural sensory response is normal.  Left sural sensory response has slightly decreased amplitude.  Bilateral superficial peroneal sensory responses could not be obtained.  Bilateral F-wave latency is normal.   NEEDLE ELECTROMYOGRAPHY:  Needle examination of right lower extremity is normal.   IMPRESSION:   This study demonstrates: - Mild axonal sensory polyneuropathy.    INTERPRETING PHYSICIAN:  Penni Bombard, MD Certified in Neurology, Neurophysiology and Neuroimaging  [facility] [address] [place], [place] [address] [phone]   [facility]    Nerve / Sites Muscle Latency Ref. Amplitude Ref. Rel Amp Segments Distance Velocity Ref. Area    ms ms mV mV %  cm m/s m/s mVms  L Peroneal - EDB     Ankle EDB 4.5 ?6.5 3.1 ?2.0 100 Ankle - EDB 9   9.6     Fib head EDB 11.2  2.2  70.7 Fib head - Ankle 29 44 ?44 8.6     Pop fossa EDB 13.5  1.9  86.7 Pop fossa - Fib head 10 44 ?44 7.4         Pop fossa - Ankle      R Peroneal - EDB     Ankle EDB 4.6 ?6.5 3.2 ?2.0 100 Ankle - EDB 9   11.3     Fib head EDB 11.1  2.3  72.5 Fib head - Ankle 29 44 ?44 10.2     Pop fossa EDB 13.4  2.3  96.7 Pop fossa - Fib head 10 44 ?44 9.8         Pop fossa - Ankle      L Tibial - AH     Ankle AH 3.6 ?5.8 4.0 ?4.0 100 Ankle - AH 9   16.2     Pop fossa AH 13.0  3.4  85 Pop fossa - Ankle 39 41 ?41 6.5  R Tibial - AH     Ankle AH 3.2 ?5.8 4.5 ?4.0 100 Ankle - AH 9   9.5     Pop fossa AH 12.7   3.4  76 Pop fossa - Ankle 39 41 ?41 10.0             SNC    Nerve / Sites Rec. Site Peak Lat Ref.  Amp Ref. Segments Distance    ms ms V V  cm  L Sural - Ankle (Calf)     Calf Ankle 3.9 ?4.4 5 ?6 Calf - Ankle 14  R Sural - Ankle (Calf)     Calf Ankle 4.0 ?4.4 6 ?6 Calf - Ankle 14  L Superficial peroneal - Ankle     Lat leg Ankle NR ?4.4 NR ?6 Lat leg - Ankle 14  R Superficial peroneal - Ankle     Lat leg Ankle NR ?4.4 NR ?6 Lat leg - Ankle 14             F  Wave    Nerve F Lat Ref.   ms ms  L Tibial - AH 55.4 ?56.0  R Tibial - AH 54.0 ?56.0  EMG Summary Table    Spontaneous MUAP Recruitment  Muscle IA Fib PSW Fasc Other Amp Dur. Poly Pattern  L. Vastus medialis Normal None None None _______ Normal Normal Normal Normal  L. Tibialis anterior Normal None None None _______ Normal Normal Normal Normal  L. Gastrocnemius (Medial head) Normal None None None _______ Normal Normal Normal Normal

## 2020-06-23 NOTE — Progress Notes (Signed)
86 year old female with gait difficulty and numbness.  FINDINGS: NERVE CONDUCTION STUDY:  Bilateral peroneal and tibial motor responses are normal.  Right sural sensory response is normal.  Left sural sensory response has slightly decreased amplitude.  Bilateral superficial peroneal sensory responses could not be obtained.  Bilateral F-wave latency is normal.   NEEDLE ELECTROMYOGRAPHY:  Needle examination of right lower extremity is normal.   IMPRESSION:   This study demonstrates: - Mild axonal sensory polyneuropathy.     INTERPRETING PHYSICIAN:  Penni Bombard, MD

## 2020-06-27 DIAGNOSIS — I639 Cerebral infarction, unspecified: Secondary | ICD-10-CM | POA: Diagnosis not present

## 2020-06-27 DIAGNOSIS — R279 Unspecified lack of coordination: Secondary | ICD-10-CM | POA: Diagnosis not present

## 2020-06-27 DIAGNOSIS — M6281 Muscle weakness (generalized): Secondary | ICD-10-CM | POA: Diagnosis not present

## 2020-06-27 DIAGNOSIS — M13862 Other specified arthritis, left knee: Secondary | ICD-10-CM | POA: Diagnosis not present

## 2020-06-27 DIAGNOSIS — Z741 Need for assistance with personal care: Secondary | ICD-10-CM | POA: Diagnosis not present

## 2020-06-28 ENCOUNTER — Ambulatory Visit
Admission: RE | Admit: 2020-06-28 | Discharge: 2020-06-28 | Disposition: A | Discharge status: Home / Self Care | Payer: Medicare Other | Source: Ambulatory Visit | Attending: Neurology | Admitting: Neurology

## 2020-06-28 ENCOUNTER — Other Ambulatory Visit: Payer: Self-pay

## 2020-06-28 DIAGNOSIS — M25561 Pain in right knee: Secondary | ICD-10-CM | POA: Diagnosis not present

## 2020-06-28 DIAGNOSIS — Z982 Presence of cerebrospinal fluid drainage device: Secondary | ICD-10-CM

## 2020-06-28 DIAGNOSIS — M6281 Muscle weakness (generalized): Secondary | ICD-10-CM | POA: Diagnosis not present

## 2020-06-28 DIAGNOSIS — R2681 Unsteadiness on feet: Secondary | ICD-10-CM | POA: Diagnosis not present

## 2020-06-28 DIAGNOSIS — R262 Difficulty in walking, not elsewhere classified: Secondary | ICD-10-CM | POA: Diagnosis not present

## 2020-06-28 DIAGNOSIS — M1990 Unspecified osteoarthritis, unspecified site: Secondary | ICD-10-CM | POA: Diagnosis not present

## 2020-06-28 DIAGNOSIS — F0391 Unspecified dementia with behavioral disturbance: Secondary | ICD-10-CM

## 2020-06-28 DIAGNOSIS — I609 Nontraumatic subarachnoid hemorrhage, unspecified: Secondary | ICD-10-CM | POA: Diagnosis not present

## 2020-06-28 DIAGNOSIS — G629 Polyneuropathy, unspecified: Secondary | ICD-10-CM

## 2020-06-28 MED ORDER — GADOBENATE DIMEGLUMINE 529 MG/ML IV SOLN
12.0000 mL | Freq: Once | INTRAVENOUS | Status: AC | PRN
  Administered 2020-06-28: 12 mL via INTRAVENOUS

## 2020-06-29 ENCOUNTER — Encounter: Payer: Self-pay | Admitting: Neurology

## 2020-06-29 DIAGNOSIS — R2681 Unsteadiness on feet: Secondary | ICD-10-CM | POA: Diagnosis not present

## 2020-06-29 DIAGNOSIS — R262 Difficulty in walking, not elsewhere classified: Secondary | ICD-10-CM | POA: Diagnosis not present

## 2020-06-29 DIAGNOSIS — M6281 Muscle weakness (generalized): Secondary | ICD-10-CM | POA: Diagnosis not present

## 2020-06-29 DIAGNOSIS — G910 Communicating hydrocephalus: Secondary | ICD-10-CM | POA: Diagnosis not present

## 2020-06-29 DIAGNOSIS — M1990 Unspecified osteoarthritis, unspecified site: Secondary | ICD-10-CM | POA: Diagnosis not present

## 2020-06-29 DIAGNOSIS — M25561 Pain in right knee: Secondary | ICD-10-CM | POA: Diagnosis not present

## 2020-06-30 ENCOUNTER — Encounter: Payer: Self-pay | Admitting: Neurology

## 2020-06-30 DIAGNOSIS — R279 Unspecified lack of coordination: Secondary | ICD-10-CM | POA: Diagnosis not present

## 2020-06-30 DIAGNOSIS — Z741 Need for assistance with personal care: Secondary | ICD-10-CM | POA: Diagnosis not present

## 2020-06-30 DIAGNOSIS — M13862 Other specified arthritis, left knee: Secondary | ICD-10-CM | POA: Diagnosis not present

## 2020-06-30 DIAGNOSIS — M6281 Muscle weakness (generalized): Secondary | ICD-10-CM | POA: Diagnosis not present

## 2020-06-30 DIAGNOSIS — I639 Cerebral infarction, unspecified: Secondary | ICD-10-CM | POA: Diagnosis not present

## 2020-06-30 NOTE — Progress Notes (Signed)
Comparison study - no evidence of shunt malfunction.   IMPRESSION:   MRI brain (with and without) demonstrating: -Stable right parietal VP shunt catheter.  No significant hydrocephalus. -Diffuse dural enhancement may represent CSF hypotension. -Distal right ICA aneurysm coiling artifact. -No acute findings.    INTERPRETING PHYSICIAN:  Penni Bombard, MD

## 2020-07-01 ENCOUNTER — Other Ambulatory Visit: Payer: BLUE CROSS/BLUE SHIELD

## 2020-07-05 DIAGNOSIS — M1990 Unspecified osteoarthritis, unspecified site: Secondary | ICD-10-CM | POA: Diagnosis not present

## 2020-07-05 DIAGNOSIS — R279 Unspecified lack of coordination: Secondary | ICD-10-CM | POA: Diagnosis not present

## 2020-07-05 DIAGNOSIS — R2681 Unsteadiness on feet: Secondary | ICD-10-CM | POA: Diagnosis not present

## 2020-07-05 DIAGNOSIS — M6281 Muscle weakness (generalized): Secondary | ICD-10-CM | POA: Diagnosis not present

## 2020-07-05 DIAGNOSIS — Z741 Need for assistance with personal care: Secondary | ICD-10-CM | POA: Diagnosis not present

## 2020-07-05 DIAGNOSIS — R262 Difficulty in walking, not elsewhere classified: Secondary | ICD-10-CM | POA: Diagnosis not present

## 2020-07-05 DIAGNOSIS — M25561 Pain in right knee: Secondary | ICD-10-CM | POA: Diagnosis not present

## 2020-07-07 DIAGNOSIS — M13862 Other specified arthritis, left knee: Secondary | ICD-10-CM | POA: Diagnosis not present

## 2020-07-07 DIAGNOSIS — Z741 Need for assistance with personal care: Secondary | ICD-10-CM | POA: Diagnosis not present

## 2020-07-07 DIAGNOSIS — R2681 Unsteadiness on feet: Secondary | ICD-10-CM | POA: Diagnosis not present

## 2020-07-07 DIAGNOSIS — M1990 Unspecified osteoarthritis, unspecified site: Secondary | ICD-10-CM | POA: Diagnosis not present

## 2020-07-07 DIAGNOSIS — R262 Difficulty in walking, not elsewhere classified: Secondary | ICD-10-CM | POA: Diagnosis not present

## 2020-07-07 DIAGNOSIS — M25561 Pain in right knee: Secondary | ICD-10-CM | POA: Diagnosis not present

## 2020-07-07 DIAGNOSIS — I639 Cerebral infarction, unspecified: Secondary | ICD-10-CM | POA: Diagnosis not present

## 2020-07-07 DIAGNOSIS — M6281 Muscle weakness (generalized): Secondary | ICD-10-CM | POA: Diagnosis not present

## 2020-07-07 DIAGNOSIS — R279 Unspecified lack of coordination: Secondary | ICD-10-CM | POA: Diagnosis not present

## 2020-07-11 DIAGNOSIS — M6281 Muscle weakness (generalized): Secondary | ICD-10-CM | POA: Diagnosis not present

## 2020-07-11 DIAGNOSIS — R279 Unspecified lack of coordination: Secondary | ICD-10-CM | POA: Diagnosis not present

## 2020-07-11 DIAGNOSIS — I639 Cerebral infarction, unspecified: Secondary | ICD-10-CM | POA: Diagnosis not present

## 2020-07-11 DIAGNOSIS — Z741 Need for assistance with personal care: Secondary | ICD-10-CM | POA: Diagnosis not present

## 2020-07-11 DIAGNOSIS — M13862 Other specified arthritis, left knee: Secondary | ICD-10-CM | POA: Diagnosis not present

## 2020-07-13 DIAGNOSIS — M6281 Muscle weakness (generalized): Secondary | ICD-10-CM | POA: Diagnosis not present

## 2020-07-13 DIAGNOSIS — I639 Cerebral infarction, unspecified: Secondary | ICD-10-CM | POA: Diagnosis not present

## 2020-07-13 DIAGNOSIS — Z741 Need for assistance with personal care: Secondary | ICD-10-CM | POA: Diagnosis not present

## 2020-07-13 DIAGNOSIS — R279 Unspecified lack of coordination: Secondary | ICD-10-CM | POA: Diagnosis not present

## 2020-07-13 DIAGNOSIS — M13862 Other specified arthritis, left knee: Secondary | ICD-10-CM | POA: Diagnosis not present

## 2020-07-19 ENCOUNTER — Other Ambulatory Visit: Payer: Self-pay | Admitting: Internal Medicine

## 2020-07-19 DIAGNOSIS — M6281 Muscle weakness (generalized): Secondary | ICD-10-CM | POA: Diagnosis not present

## 2020-07-19 DIAGNOSIS — M1990 Unspecified osteoarthritis, unspecified site: Secondary | ICD-10-CM | POA: Diagnosis not present

## 2020-07-19 DIAGNOSIS — Z1231 Encounter for screening mammogram for malignant neoplasm of breast: Secondary | ICD-10-CM

## 2020-07-19 DIAGNOSIS — R262 Difficulty in walking, not elsewhere classified: Secondary | ICD-10-CM | POA: Diagnosis not present

## 2020-07-19 DIAGNOSIS — R2681 Unsteadiness on feet: Secondary | ICD-10-CM | POA: Diagnosis not present

## 2020-07-19 DIAGNOSIS — M25561 Pain in right knee: Secondary | ICD-10-CM | POA: Diagnosis not present

## 2020-07-20 DIAGNOSIS — R279 Unspecified lack of coordination: Secondary | ICD-10-CM | POA: Diagnosis not present

## 2020-07-20 DIAGNOSIS — Z741 Need for assistance with personal care: Secondary | ICD-10-CM | POA: Diagnosis not present

## 2020-07-20 DIAGNOSIS — M6281 Muscle weakness (generalized): Secondary | ICD-10-CM | POA: Diagnosis not present

## 2020-07-20 DIAGNOSIS — I639 Cerebral infarction, unspecified: Secondary | ICD-10-CM | POA: Diagnosis not present

## 2020-07-20 DIAGNOSIS — M13862 Other specified arthritis, left knee: Secondary | ICD-10-CM | POA: Diagnosis not present

## 2020-07-21 DIAGNOSIS — M25561 Pain in right knee: Secondary | ICD-10-CM | POA: Diagnosis not present

## 2020-07-21 DIAGNOSIS — Z741 Need for assistance with personal care: Secondary | ICD-10-CM | POA: Diagnosis not present

## 2020-07-21 DIAGNOSIS — M6281 Muscle weakness (generalized): Secondary | ICD-10-CM | POA: Diagnosis not present

## 2020-07-21 DIAGNOSIS — R2681 Unsteadiness on feet: Secondary | ICD-10-CM | POA: Diagnosis not present

## 2020-07-21 DIAGNOSIS — R262 Difficulty in walking, not elsewhere classified: Secondary | ICD-10-CM | POA: Diagnosis not present

## 2020-07-21 DIAGNOSIS — R279 Unspecified lack of coordination: Secondary | ICD-10-CM | POA: Diagnosis not present

## 2020-07-21 DIAGNOSIS — M13862 Other specified arthritis, left knee: Secondary | ICD-10-CM | POA: Diagnosis not present

## 2020-07-21 DIAGNOSIS — I639 Cerebral infarction, unspecified: Secondary | ICD-10-CM | POA: Diagnosis not present

## 2020-07-21 DIAGNOSIS — M1990 Unspecified osteoarthritis, unspecified site: Secondary | ICD-10-CM | POA: Diagnosis not present

## 2020-07-28 DIAGNOSIS — G910 Communicating hydrocephalus: Secondary | ICD-10-CM | POA: Diagnosis not present

## 2020-08-09 ENCOUNTER — Ambulatory Visit (INDEPENDENT_AMBULATORY_CARE_PROVIDER_SITE_OTHER): Payer: Medicare Other | Admitting: Neurology

## 2020-08-09 ENCOUNTER — Encounter: Payer: Self-pay | Admitting: Neurology

## 2020-08-09 VITALS — BP 131/84 | HR 75 | Ht 65.0 in | Wt 145.0 lb

## 2020-08-09 DIAGNOSIS — R2689 Other abnormalities of gait and mobility: Secondary | ICD-10-CM | POA: Insufficient documentation

## 2020-08-09 DIAGNOSIS — R269 Unspecified abnormalities of gait and mobility: Secondary | ICD-10-CM

## 2020-08-09 NOTE — Progress Notes (Signed)
[facility]  PATIENT: Sharon [name] DOB: 20-Feb-1935   REASON FOR VISIT: Follow-up for gait imbalance and cognitive decline, memory loss  08-09-2020: RV after NCV and EMG testing : This study demonstrates: - Mild axonal sensory polyneuropathy.  MRI brain (with and without) demonstrating: -Stable right parietal VP shunt catheter.  No significant hydrocephalus. -Diffuse dural enhancement may represent CSF hypotension. -Distal right ICA aneurysm coiling artifact. -No acute findings.  INTERPRETING PHYSICIAN:  Penni Bombard, MD, 06-30-2020.   Patient reports feet and hand getting numb. Dropping objects.         HISTORY FROM: Patient's caretaker: 06-01-2020: In December , she was able to walk around the [facility] area in fast pace, and somehow all changed. Now  byumbing into things, and can hardly walk across the hallway.  Recently in the last 8-10 weeks noted a rapid decline. Her balance is affected, she became  became unstable and her memory declined along with that. They initially thought UTI and was negative. Here to address these concerns. She now lives at [place], with her [place] boyfriend. She calls her caretaker and insists that she needs to get her furniture from her " other apartment" referring to [place], her residence in 2016, when she suffered a brain aneurysm.  She is sometimes stating that there is "another [facility]" and " another apartment", another person impersonating her friends and  family.  She was prescribed PT and her therapist asked her to see neurology. Has OT, too.  6 years ago there was a fixation with stool, with parasites that's were never found in several stool tests.    11-14-2016 I had the pleasure of meeting Sharon [name] and her daughter-in-law today on 11/14/2016, the patient is here for routine revisit with memory testing she performed today well on a Montral cognitive assessment with a final count of 25 points out of  30 possible points. Geriatric depression scale was endorsed at 2 out of 15 points. 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    Sharon [name] is here for a routine revisit from 05/17/2015. She has been on Nicotrol patches trying to quit smoking and hasn't had a cigarette in over a month. She reports no confusion she had an auto fall in generally in the backyard of her home and her grandson brought her to the emergency room were no abnormalities were found no infection or metabolic abnormality no x-ray injuries. She has not been hospitalized. She is driving. She is working and her own shop. She went to an antique fair in [place], [place].  She is independent in all activities of daily living reveals today a marker test and she scored 25 out of 30 points with 13  Points on word fluency test results.  This is to points better than her last test results from January. She is sleeping well her appetite is good, she is taking yoga classes.  She is willing to move to [facility] and planning her transition. She has a close friend there and is looking forward to his company.  She appears perfectly well adjusted . I would not refrain her from driving. I will follow up q 6 month with MOCA and MMSE.    03-10-15 ,CM Sharon [name], 86 year old female returns for follow-up. She was last seen 08/16/14.  She has a history of memory loss and nontraumatic subarachnoid hemorrhage. Her  Aricept was reduced to 5 mg daily at that visit due to  diarrhea. Since that time she called back in expected Dr. Brett Fairy on 10/25/2014 who told her to stop the Aricept altogether and began a Namenda starter pack which she picked up from her pharmacy and took the first month. According to her pharmacy today her refills have never been picked up. She reports that her memory is stable. She no longer has caregiver in the home. She is back to driving and has not had any problems. MOCA test today 23 out of 30. Last was 26  out of 30 .review She reports that her appetite is good and she is sleeping well. She is taking yoga .She returns for reevaluation   UPDATE 4-4-16CD The meeting today for a prolonged revisit appointment of 30 minutes. Sharon [name] is here today with a caretaker, Sharon [name], and appears very alert and concentrated. She is reporting depression, but also hope, She has reported diarrhea, and suspects that either her bacterial setting is disturbed from ATB , or she may have contracted clostridium . Be repeated today and MMSE that the patient had already performed the last time at 23 out of 30 points today she scored 27 out of 30 points. She recalled 2 day 2 of the 3 words that she missed last time was able to do the serial sevens completely she was not sure about the season and also the spring she stated it was fall. She was able to copy an object and clock draw a clock face correctly she named 9 animals in the animal fluency test and she wrote a perfect sentence writing. Over 2 to make it a little bit more difficult she scored 25 out of 30 on the much more difficult Montral assessment test.I also have a transcranial Doppler study available here as was normal as flow direction and velocity in all identified vessels in expected sinus no evidence of stenosis basilar spasm or occlusion. The patient fell asleep doing the test and the technician noted that she was snoring. Her VP shunt performs well. She is much improved.  As she has no longer confusion, she keeps active in conversation and is interested in daily activities. I will ask her to come back in June and likely will give her permission d to drive if her recovery is sustained. I would like her to try the Ritalin that she was prescribed in [facility]. I will increase aricept to 10 mg now form 5 mg.   HISTORY OF PRESENT ILLNESS::Mrs. Kareem and had a very eventful end of the last year . He was suspected to have a UTI causing confusion, and was admitted on  02-28-14 , given multiple doses of ativan - Her agitation got worse. The attempt to do a lumbar puncture was unsuccessful. She needed sedation for an MRI, which did showed an aneurysm. Under the MRI sedation a LP was finally done, the CSF was cloudy. 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 She was coiled by Dr. Estanislado Pandy .  [name]- but a communicating hydrocephalus developed nonetheless, and a VP shunt was placed on Jan. 11th 2016 by Dr. Trenton Gammon. Since the aneurysm bleed occurred the patient also developed a urinary tract infection indeed and 100,000 colonies were seen on 04/11/2014 in her urinary sample that was obtained while she was in rehabilitation at [facility]. The main concern now is that this 86 year old right-handed Caucasian lady has still continued to have some altered mental status. She is not quite as  concentrated and focused and she is repetitive.   The patient used to live independently prior to December 2015. She was referred referred from hospital to come in place for rehabilitation per physical and occupational therapy were ongoing. She was placed on the more top to prevent muscle spasms. Decadron protocol was directed. In generally the patient still had some low-grade fevers that maintained on broad-spectrum antibiotics these stopped. She tends to confabulate during the exam and she has mild left-sided dysmetria and ataxia clearly related to the right-sided lesion. She is easily distracted. She was placed on Ritalin at bedtime to help improve anytime arousal focus attention and issues in regards to mental status. She is developed insomnia which is very common after an aneurysm bleed. She sometimes restless but she is doing much better than she has a visitor that she recognizes and trusts. She is currently on Pepcid, on Ritalin, Zocor at bedtime only. She was on a  mechanical soft diet and thin liquids. Diet changed during her [place] rehabilitation stay to a regular diet. I was able to review the patient's recent lab results from 04-13-14 showed a normal CBC and differential her platelet count was 463,000. Her urinalysis confirmed 2 days after the beginning of Macrobid only few bacteria. Hepatic metabolic panel was entirely normal including creatinine and potassium and sodium. I did not see a TSH. However the TSH would unlikely be affected with and only 6 weeks after an aneurysm bleed. I will refer the patient also for transcranial Doppler study to make sure that there is no muscle spasm at the current time. She does not complain about headaches right now. Neither the patient nor her daughter confirm any nausea the last couple of days. She has no neck stiffness.    REVIEW OF SYSTEMS: Full 14 system review of systems performed and notable only for those listed, all others are neg:  Sharon [name] reports no recent confusional episodes, she does not appear depressed and her geriatric depression score is endorsed at only 2 points, she scored fine in her Montral cognitive assessment test at 25 out of 30 points. She missed to recall words areas she was fine in trail making, could draw a cube and a clock face visual spatial and orientation were all intact. I would like for her to leave a stool sample today take your take the tube with her to rule out that her diarrhea is related to an infection and perhaps not to medication at all.  ALLERGIES: Allergies  Allergen Reactions  . Aricept [Donepezil Hcl] Diarrhea    HOME MEDICATIONS: Outpatient Medications Prior to Visit  Medication Sig Dispense Refill  . atorvastatin (LIPITOR) 20 MG tablet TAKE 1 TABLET EVERY DAY 90 tablet 1  . Calcium Carbonate (CALCIUM 600 PO) Take 1 tablet by mouth daily.     . Cholecalciferol (VITAMIN D) 50 MCG (2000 UT) CAPS Take 1 capsule (2,000 Units total) by mouth daily. 90 capsule 3  . denosumab  (PROLIA) 60 MG/ML SOLN injection Inject 60 mg into the skin every 6 (six) months. Administer in upper arm, thigh, or abdomen    . Melatonin 5 MG CAPS Take 1 capsule (5 mg total) by mouth at bedtime as needed. 28 capsule 6  . memantine (NAMENDA) 10 MG tablet TAKE 1 TABLET TWICE DAILY 180 tablet 1  . senna (SENOKOT) 8.6 MG TABS tablet Take 1 tablet (8.6 mg total) by mouth daily as needed for mild constipation. 90 tablet 3  . sertraline (ZOLOFT) 50 MG tablet  TAKE 1 TABLET EVERY DAY 90 tablet 1  . valACYclovir (VALTREX) 1000 MG tablet Take 1 tablet (1,000 mg total) by mouth 3 (three) times daily. 21 tablet 0   No facility-administered medications prior to visit.    PAST MEDICAL HISTORY: Past Medical History:  Diagnosis Date  . Aneurysm ([place])   . Arthritis    osteo  . Cystitis 03/02/2014   acute   . Dyslipidemia   . Hearing loss bil   got hearing aides.     PAST SURGICAL HISTORY: Past Surgical History:  Procedure Laterality Date  . ABDOMINAL HYSTERECTOMY    . RADIOLOGY WITH ANESTHESIA N/A 03/04/2014   Procedure: RADIOLOGY WITH ANESTHESIA;  Surgeon: Rob Hickman, MD;  Location: [facility];  Service: Radiology;  Laterality: N/A;  . RADIOLOGY WITH ANESTHESIA N/A 03/04/2014   Procedure: RADIOLOGY WITH ANESTHESIA;  Surgeon: Medication Radiologist, MD;  Location: [facility];  Service: Radiology;  Laterality: N/A;  . VENTRICULOPERITONEAL SHUNT Right 03/15/2014   Procedure: SHUNT INSERTION VENTRICULAR-PERITONEAL;  Surgeon: Charlie Pitter, MD;  Location: [facility];  Service: Neurosurgery;  Laterality: Right;    FAMILY HISTORY: Family History  Problem Relation Age of Onset  . Hypertension Mother   . Hypertension Father     SOCIAL HISTORY: Social History   Socioeconomic History  . Marital status: Widowed    Spouse name: Not on file  . Number of children: 4  . Years of education: hs grad  . Highest education level: Not on file  Occupational History  . Not on file  Tobacco Use  .  Smoking status: Former Smoker    Packs/day: 0.50    Types: Cigarettes    Quit date: 03/05/2014    Years since quitting: 6.4  . Smokeless tobacco: Never Used  Vaping Use  . Vaping Use: Never used  Substance and Sexual Activity  . Alcohol use: Not Currently    Alcohol/week: 0.0 standard drinks    Comment: recovering alcoholic- started drinking 2 weeks ago (07-04-14), stated not drinking. 08-16-14  . Drug use: No  . Sexual activity: Not on file  Other Topics Concern  . Not on file  Social History Narrative   Caffeine 1 cup am avg.  Lives at [place].     Social Determinants of Health   Financial Resource Strain: Not on file  Food Insecurity: Not on file  Transportation Needs: Not on file  Physical Activity: Not on file  Stress: Not on file  Social Connections: Not on file  Intimate Partner Violence: Not on file     PHYSICAL EXAM  Vitals:   08/09/20 1034  BP: 131/84  Pulse: 75  Weight: 145 lb (65.8 kg)  Height: 5\' 5"  (1.651 m)   Body mass index is 24.13 kg/m.   Generalized: Well developed, well groomed in no acute distress  Head: normocephalic and atraumatic. Oropharynx benign  Neck: Supple, no carotid bruits  Cardiac: Regular rate rhythm, no murmur  Musculoskeletal: No deformity   Neurological examination;  Mentation: not alert -neither oriented to time, place, and unable to get any history .  Attention span and concentration span - absent    [facility] Cognitive Assessment  11/14/2016 05/21/2016 11/21/2015 05/17/2015 02/15/2015  Visuospatial/ Executive (0/5) 4 5 5 5 4   Naming (0/3) 3 3 3 3 3   Attention: Read list of digits (0/2) 2 2 1 1 1   Attention: Read list of letters (0/1) 1 1 1 1 1   Attention: Serial 7 subtraction starting at 100 (0/3) 2  2 1 2 3   Language: Repeat phrase (0/2) 2 2 2 2 2   Language : Fluency (0/1) 1 0 1 1 0  Abstraction (0/2) 1 1 2 2 1   Delayed Recall (0/5) 3 3 2 3 2   Orientation (0/6) 6 6 6 5 6   Total 25 25 24 25 23   Adjusted Score (based  on education) - - 24 25 23    MMSE - Mini Mental State Exam 06/01/2020 04/20/2014  Orientation to time 4 4  Orientation to Place 4 4  Registration 3 3  Attention/ Calculation 2 3  Recall 1 0  Language- name 2 objects 2 2  Language- repeat 1 1  Language- follow 3 step command 3 3  Language- read & follow direction 1 1  Write a sentence 1 1  Copy design 1 1  Total score 23 23      Cranial nerve :  Pupils were equal in size, status post cataract surgery-  round but not reactive to light. The  extraocular movements were full, visual field were full on confrontational test. Diplopia  Paroxysmally- horizontal diplopia with resolution-  Facial sensation and strength were normal. hearing was impaired bilaterally.  Uvula and tongue midline. head turning and shoulder shrug were normal and symmetric. Tongue protrusion into cheek strength was normal. Motor: normal bulk and tone, drift in left upper extremity,  with supination and gait with " slapping"  left foot drop.   fine finger movements normal, dropping objects- right hand grip is weaker. She dropped plate with food.  Today pinch strength is weaker on the left,  Sensory:  Vibration not felt in either knee and not in left foot ankle, shin.   Coordination: finger-nose-with dysmetria- no tremor , just off target.  Reflexes: 1 plus.  Gait and Station: uses a rollator, stops frequently while walking. Here walks fast- and with feet slapping flat on the ground, wide based- forward leaning, chasing her point of gravity . She gets faster and leans forward more as she goes.   Rising up from seated position without assistance, but needs  bracing.   Wide based  stance, wide based gait, with left foot drop , fragmented 6 step- turns.  Reduced arm swing, unable to perform tiptoe, and heel walking .  Wall surfing, furniture surfing.   Tandem gait not attempted, drifting to the left and right. Very ataxic.   DIAGNOSTIC DATA (LABS, IMAGING,  TESTING)  : 1. The ventricles are slightly increased in size in comparison to the prior, but remain decompressed without specific evidence of hydrocephalus. 2. Slight interval increase in mildly hyperdense bilateral subdural collections which measure up to approximately 6 mm in thickness bilaterally. These may represent chronic subdural hemorrhages versus complex hygromas. No substantial mass effect or midline shift. Recommend correlation with evidence of over shunting. 3. Right posterior communicating artery region aneurysm coil.  These results will be called to the ordering clinician or representative by the Radiologist Assistant, and communication documented in the PACS or [employer].    Lab Results  Component Value Date   WBC 9.0 06/01/2020   HGB 14.0 06/01/2020   HCT 42.4 06/01/2020   MCV 95 06/01/2020   PLT 283 06/01/2020      Component Value Date/Time   NA 143 04/21/2020 0000   K 4.1 04/21/2020 0000   CL 106 04/21/2020 0000   CO2 26 (A) 04/21/2020 0000   GLUCOSE 140 (H) 03/24/2015 1009   BUN 35 (A) 04/21/2020 0000   CREATININE 0.9  04/21/2020 0000   CREATININE 0.87 03/24/2015 1009   CALCIUM 9.1 04/21/2020 0000   PROT 6.6 06/01/2020 1446   ALBUMIN 4.0 04/21/2020 0000   AST 18 04/21/2020 0000   ALT 23 04/21/2020 0000   ALKPHOS 77 04/21/2020 0000   BILITOT 0.6 03/24/2015 1009   GFRNONAA >60 03/24/2015 1009   GFRAA >60 03/24/2015 1009   Lab Results  Component Value Date   CHOL 170 04/21/2020   HDL 71 (A) 04/21/2020   LDLCALC 74 04/21/2020   TRIG 122 04/21/2020   CHOLHDL 2.5 03/10/2014    Lab Results  Component Value Date   VITAMINB12 527 04/21/2020   Lab Results  Component Value Date   TSH 2.37 04/21/2020      ASSESSMENT  60 minutes - gait, sensory, memory, eye movements.  86 y.o. 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  She has been seen yearly at [facility] in [place], [place].  [facility].  They prescribed Namenda. [phone]. She was placed on a Namenda starter pack and never picked up the refill medication.  She was in 2016  back to driving. 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 She was sober and recovering for almost 3 decades.  She has moved to [employer]. She has dinner at [facility] nightly, feels in good company.  There was a sudden change in cognitive function and balance- this is dementia, very impaired short term memory, Imposter syndrome. Forgetting conversations and names all the time, not able to care for herself -  she has private assistance while remaining  living in the independent section.   Assessment and Plan :  1) Severe Ataxia of gait , also with neuropathy- affecting  left more than right lower extremity, and with weakness- foot drop on the left, pronator drift on the left.  Has numbness in fingers and loss of grip strength , too. She is always in motion, tapping, scratching, wringing.   2) gait is propulsive and she may fall forward. May fall propulsive- she could benefit from a non rolling walker that can be used indoors on even floors, and she surfs on furniture in the home.    3) memory loss- severe hearing loss- hearing aids not helping that much. unfocussed and unable to recall our discussion within 3 minutes of concluding it.  MMSE was not that much lower.   MULTIFACTORIAL GAIT DISORDER in DEMENTIA. Not parkinsonian. Smooth muscle tone.   PT has given up- no improvement, no goal.     RV prn     Larey Seat, MD  [facility] [address], [place], [place] [address] [phone]

## 2020-08-09 NOTE — Patient Instructions (Signed)
Fall Prevention in the Home, Adult Falls can cause injuries and can happen to people of all ages. There are many things you can do to make your home safe and to help prevent falls. Ask for help when making these changes. What actions can I take to prevent falls? General Instructions  Use good lighting in all rooms. Replace any light bulbs that burn out.  Turn on the lights in dark areas. Use night-lights.  Keep items that you use often in easy-to-reach places. Lower the shelves around your home if needed.  Set up your furniture so you have a clear path. Avoid moving your furniture around.  Do not have throw rugs or other things on the floor that can make you trip.  Avoid walking on wet floors.  If any of your floors are uneven, fix them.  Add color or contrast paint or tape to clearly mark and help you see: ? Grab bars or handrails. ? First and last steps of staircases. ? Where the edge of each step is.  If you use a stepladder: ? Make sure that it is fully opened. Do not climb a closed stepladder. ? Make sure the sides of the stepladder are locked in place. ? Ask someone to hold the stepladder while you use it.  Know where your pets are when moving through your home. What can I do in the bathroom?  Keep the floor dry. Clean up any water on the floor right away.  Remove soap buildup in the tub or shower.  Use nonskid mats or decals on the floor of the tub or shower.  Attach bath mats securely with double-sided, nonslip rug tape.  If you need to sit down in the shower, use a plastic, nonslip stool.  Install grab bars by the toilet and in the tub and shower. Do not use towel bars as grab bars.      What can I do in the bedroom?  Make sure that you have a light by your bed that is easy to reach.  Do not use any sheets or blankets for your bed that hang to the floor.  Have a firm chair with side arms that you can use for support when you get dressed. What can I do in  the kitchen?  Clean up any spills right away.  If you need to reach something above you, use a step stool with a grab bar.  Keep electrical cords out of the way.  Do not use floor polish or wax that makes floors slippery. What can I do with my stairs?  Do not leave any items on the stairs.  Make sure that you have a light switch at the top and the bottom of the stairs.  Make sure that there are handrails on both sides of the stairs. Fix handrails that are broken or loose.  Install nonslip stair treads on all your stairs.  Avoid having throw rugs at the top or bottom of the stairs.  Choose a carpet that does not hide the edge of the steps on the stairs.  Check carpeting to make sure that it is firmly attached to the stairs. Fix carpet that is loose or worn. What can I do on the outside of my home?  Use bright outdoor lighting.  Fix the edges of walkways and driveways and fix any cracks.  Remove anything that might make you trip as you walk through a door, such as a raised step or threshold.  Trim any   bushes or trees on paths to your home.  Check to see if handrails are loose or broken and that both sides of all steps have handrails.  Install guardrails along the edges of any raised decks and porches.  Clear paths of anything that can make you trip, such as tools or rocks.  Have leaves, snow, or ice cleared regularly.  Use sand or salt on paths during winter.  Clean up any spills in your garage right away. This includes grease or oil spills. What other actions can I take?  Wear shoes that: ? Have a low heel. Do not wear high heels. ? Have rubber bottoms. ? Feel good on your feet and fit well. ? Are closed at the toe. Do not wear open-toe sandals.  Use tools that help you move around if needed. These include: ? Canes. ? Walkers. ? Scooters. ? Crutches.  Review your medicines with your doctor. Some medicines can make you feel dizzy. This can increase your chance  of falling. Ask your doctor what else you can do to help prevent falls. Where to find more information  Centers for Disease Control and Prevention, STEADI: www.cdc.gov  National Institute on Aging: www.nia.nih.gov Contact a doctor if:  You are afraid of falling at home.  You feel weak, drowsy, or dizzy at home.  You fall at home. Summary  There are many simple things that you can do to make your home safe and to help prevent falls.  Ways to make your home safe include removing things that can make you trip and installing grab bars in the bathroom.  Ask for help when making these changes in your home. This information is not intended to replace advice given to you by your health care provider. Make sure you discuss any questions you have with your health care provider. Document Revised: 09/23/2019 Document Reviewed: 09/23/2019 Elsevier Patient Education  2021 Elsevier Inc.  

## 2020-08-17 ENCOUNTER — Other Ambulatory Visit: Payer: Self-pay

## 2020-08-17 ENCOUNTER — Ambulatory Visit (INDEPENDENT_AMBULATORY_CARE_PROVIDER_SITE_OTHER): Payer: Medicare Other | Admitting: Family Medicine

## 2020-08-17 ENCOUNTER — Encounter: Payer: Self-pay | Admitting: Family Medicine

## 2020-08-17 VITALS — BP 128/78 | Ht 65.0 in | Wt 145.0 lb

## 2020-08-17 DIAGNOSIS — F0391 Unspecified dementia with behavioral disturbance: Secondary | ICD-10-CM | POA: Diagnosis not present

## 2020-08-17 DIAGNOSIS — M549 Dorsalgia, unspecified: Secondary | ICD-10-CM | POA: Insufficient documentation

## 2020-08-17 DIAGNOSIS — R413 Other amnesia: Secondary | ICD-10-CM

## 2020-08-17 DIAGNOSIS — L309 Dermatitis, unspecified: Secondary | ICD-10-CM | POA: Diagnosis not present

## 2020-08-17 DIAGNOSIS — M545 Low back pain, unspecified: Secondary | ICD-10-CM

## 2020-08-17 MED ORDER — SARNA 0.5-0.5 % EX LOTN
1.0000 | TOPICAL_LOTION | CUTANEOUS | 0 refills | Status: AC | PRN
Start: 2020-08-17 — End: ?

## 2020-08-17 NOTE — Progress Notes (Signed)
Provider:  Alain Honey, MD  Careteam: Patient Care Team: Virgie Dad, MD as PCP - General (Internal Medicine) Dohmeier, Asencion Partridge, MD as Consulting Physician (Neurology)  PLACE OF SERVICE:  [place] Directive information    Allergies  Allergen Reactions   Aricept [Donepezil Hcl] Diarrhea    Chief Complaint  Patient presents with   Acute Visit    Patient presents today for back pain for about 1 month now. She has not taking anything but Tylenol often.  As patient care giver reports a possible rash on her upper chest that she just noticed. She reports the area is red.     HPI: Patient is a 86 y.o. female patient is a resident of [facility] and lives in an independent apartment or house.  She has some back pain.  She is being seen by neurology and neurosurgery for various difficulties with ambulation balance foot drop tingling and numbness in her hands and feet.  Findings have been negative thus far.  She does have a history of cognitive decline and takes Namenda for years.  There has been no history of fall or injury.  She takes Tylenol which is effective.  There is no radiation to hips or legs.  Review of Systems:  Review of Systems  Musculoskeletal:  Positive for back pain.  Psychiatric/Behavioral:  Positive for memory loss.   All other systems reviewed and are negative.  Past Medical History:  Diagnosis Date   Aneurysm ([place])    Arthritis    osteo   Cystitis 03/02/2014   acute    Dyslipidemia    Hearing loss bil   got hearing aides.    Past Surgical History:  Procedure Laterality Date   ABDOMINAL HYSTERECTOMY     RADIOLOGY WITH ANESTHESIA N/A 03/04/2014   Procedure: RADIOLOGY WITH ANESTHESIA;  Surgeon: Rob Hickman, MD;  Location: [place];  Service: Radiology;  Laterality: N/A;   RADIOLOGY WITH ANESTHESIA N/A 03/04/2014   Procedure: RADIOLOGY WITH ANESTHESIA;  Surgeon: Medication Radiologist, MD;  Location: [place];  Service: Radiology;   Laterality: N/A;   VENTRICULOPERITONEAL SHUNT Right 03/15/2014   Procedure: SHUNT INSERTION VENTRICULAR-PERITONEAL;  Surgeon: Charlie Pitter, MD;  Location: [facility];  Service: Neurosurgery;  Laterality: Right;   Social History:   reports that she quit smoking about 6 years ago. Her smoking use included cigarettes. She smoked an average of 0.50 packs per day. She has never used smokeless tobacco. She reports previous alcohol use. She reports that she does not use drugs.  Family History  Problem Relation Age of Onset   Hypertension Mother    Hypertension Father     Medications: Patient's Medications  New Prescriptions   No medications on file  Previous Medications   ATORVASTATIN (LIPITOR) 20 MG TABLET    TAKE 1 TABLET EVERY DAY   CALCIUM CARBONATE (CALCIUM 600 PO)    Take 1 tablet by mouth daily.    CHOLECALCIFEROL (VITAMIN D) 50 MCG (2000 UT) CAPS    Take 1 capsule (2,000 Units total) by mouth daily.   DENOSUMAB (PROLIA) 60 MG/ML SOLN INJECTION    Inject 60 mg into the skin every 6 (six) months. Administer in upper arm, thigh, or abdomen   MELATONIN 5 MG CAPS    Take 1 capsule (5 mg total) by mouth at bedtime as needed.   MEMANTINE (NAMENDA) 10 MG TABLET    TAKE 1 TABLET TWICE DAILY   SENNA (SENOKOT) 8.6 MG TABS TABLET  Take 1 tablet (8.6 mg total) by mouth daily as needed for mild constipation.   SERTRALINE (ZOLOFT) 50 MG TABLET    TAKE 1 TABLET EVERY DAY   VALACYCLOVIR (VALTREX) 1000 MG TABLET    Take 1 tablet (1,000 mg total) by mouth 3 (three) times daily.  Modified Medications   No medications on file  Discontinued Medications   No medications on file    Physical Exam:  Vitals:   08/17/20 0934  BP: 128/78   There is no height or weight on file to calculate BMI. Wt Readings from Last 3 Encounters:  08/09/20 145 lb (65.8 kg)  06/01/20 139 lb (63 kg)  04/20/20 135 lb 12.8 oz (61.6 kg)    Physical Exam Vitals and nursing note reviewed.  Constitutional:       Appearance: Normal appearance.  Cardiovascular:     Rate and Rhythm: Normal rate.  Pulmonary:     Effort: Pulmonary effort is normal.  Musculoskeletal:     Comments: Back: Normal range of motion although balance is unsteady with bending laterally. Straight leg raising negative Deep tendon reflexes are symmetric at the knees There is no tenderness to percussion over the spine or lateral lumbar areas  Neurological:     Mental Status: She is alert.    Labs reviewed: Basic Metabolic Panel: Recent Labs    04/21/20 0000  NA 143  K 4.1  CL 106  CO2 26*  BUN 35*  CREATININE 0.9  CALCIUM 9.1  TSH 2.37   Liver Function Tests: Recent Labs    04/21/20 0000 06/01/20 1446  AST 18  --   ALT 23  --   ALKPHOS 77  --   PROT  --  6.6  ALBUMIN 4.0  --    No results for input(s): LIPASE, AMYLASE in the last 8760 hours. No results for input(s): AMMONIA in the last 8760 hours. CBC: Recent Labs    04/21/20 0000 06/01/20 1446  WBC 7.4 9.0  NEUTROABS  --  6.2  HGB 13.3 14.0  HCT 41 42.4  MCV  --  95  PLT 258 283   Lipid Panel: Recent Labs    04/21/20 0000  CHOL 170  HDL 71*  LDLCALC 74  TRIG 122   TSH: Recent Labs    04/21/20 0000  TSH 2.37   A1C: No results found for: HGBA1C   Assessment/Plan  1. Dermatitis Nonspecific rash on chest.  Will try Sarna lotion - camphor-menthol (SARNA) lotion; Apply 1 application topically as needed for itching.  Dispense: 222 mL; Refill: 0  2. Memory loss Patient lives independently at [facility].  She does have housekeepers who regularly attend her.  Continues with Namenda  3. Acute low back pain without sciatica, unspecified back pain laterality Back exam is normal and pain is relieved by Tylenol.  I gave her some simple stretches to do every morning for pain     Alain Honey, MD [place] [phone]

## 2020-08-17 NOTE — Patient Instructions (Signed)
Back Exercises These exercises help to make your trunk and back strong. They also help to keep the lower back flexible. Doing these exercises can help to prevent back pain or lessen existing pain. If you have back pain, try to do these exercises 2-3 times each day or as told by your doctor. As you get better, do the exercises once each day. Repeat the exercises more often as told by your doctor. To stop back pain from coming back, do the exercises once each day, or as told by your doctor. Exercises Single knee to chest Do these steps 3-5 times in a row for each leg: Lie on your back on a firm bed or the floor with your legs stretched out. Bring one knee to your chest. Grab your knee or thigh with both hands and hold them it in place. Pull on your knee until you feel a gentle stretch in your lower back or buttocks. Keep doing the stretch for 10-30 seconds. Slowly let go of your leg and straighten it. Pelvic tilt Do these steps 5-10 times in a row: Lie on your back on a firm bed or the floor with your legs stretched out. Bend your knees so they point up to the ceiling. Your feet should be flat on the floor. Tighten your lower belly (abdomen) muscles to press your lower back against the floor. This will make your tailbone point up to the ceiling instead of pointing down to your feet or the floor. Stay in this position for 5-10 seconds while you gently tighten your muscles and breathe evenly. Cat-cow Do these steps until your lower back bends more easily: Get on your hands and knees on a firm surface. Keep your hands under your shoulders, and keep your knees under your hips. You may put padding under your knees. Let your head hang down toward your chest. Tighten (contract) the muscles in your belly. Point your tailbone toward the floor so your lower back becomes rounded like the back of a cat. Stay in this position for 5 seconds. Slowly lift your head. Let the muscles of your belly relax. Point  your tailbone up toward the ceiling so your back forms a sagging arch like the back of a cow. Stay in this position for 5 seconds.  Press-ups Do these steps 5-10 times in a row: Lie on your belly (face-down) on the floor. Place your hands near your head, about shoulder-width apart. While you keep your back relaxed and keep your hips on the floor, slowly straighten your arms to raise the top half of your body and lift your shoulders. Do not use your back muscles. You may change where you place your hands in order to make yourself more comfortable. Stay in this position for 5 seconds. Slowly return to lying flat on the floor.  Bridges Do these steps 10 times in a row: Lie on your back on a firm surface. Bend your knees so they point up to the ceiling. Your feet should be flat on the floor. Your arms should be flat at your sides, next to your body. Tighten your butt muscles and lift your butt off the floor until your waist is almost as high as your knees. If you do not feel the muscles working in your butt and the back of your thighs, slide your feet 1-2 inches farther away from your butt. Stay in this position for 3-5 seconds. Slowly lower your butt to the floor, and let your butt muscles relax. If  this exercise is too easy, try doing it with your arms crossed over yourchest. Belly crunches Do these steps 5-10 times in a row: Lie on your back on a firm bed or the floor with your legs stretched out. Bend your knees so they point up to the ceiling. Your feet should be flat on the floor. Cross your arms over your chest. Tip your chin a little bit toward your chest but do not bend your neck. Tighten your belly muscles and slowly raise your chest just enough to lift your shoulder blades a tiny bit off of the floor. Avoid raising your body higher than that, because it can put too much stress on your low back. Slowly lower your chest and your head to the floor. Back lifts Do these steps 5-10 times  in a row: Lie on your belly (face-down) with your arms at your sides, and rest your forehead on the floor. Tighten the muscles in your legs and your butt. Slowly lift your chest off of the floor while you keep your hips on the floor. Keep the back of your head in line with the curve in your back. Look at the floor while you do this. Stay in this position for 3-5 seconds. Slowly lower your chest and your face to the floor. Contact a doctor if: Your back pain gets a lot worse when you do an exercise. Your back pain does not get better 2 hours after you exercise. If you have any of these problems, stop doing the exercises. Do not do them again unless your doctor says it is okay. Get help right away if: You have sudden, very bad back pain. If this happens, stop doing the exercises. Do not do them again unless your doctor says it is okay. This information is not intended to replace advice given to you by your health care provider. Make sure you discuss any questions you have with your healthcare provider. Document Revised: 11/14/2017 Document Reviewed: 11/14/2017 Elsevier Patient Education  2022 [employer].

## 2020-08-31 ENCOUNTER — Other Ambulatory Visit (HOSPITAL_BASED_OUTPATIENT_CLINIC_OR_DEPARTMENT_OTHER): Payer: Self-pay

## 2020-08-31 ENCOUNTER — Ambulatory Visit: Payer: Medicare Other | Attending: Internal Medicine

## 2020-08-31 DIAGNOSIS — Z23 Encounter for immunization: Secondary | ICD-10-CM

## 2020-08-31 MED ORDER — COVID-19 MRNA VACC (MODERNA) 100 MCG/0.5ML IM SUSP
INTRAMUSCULAR | 0 refills | Status: AC
  Filled 2020-08-31: qty 0.25, 1d supply, fill #0

## 2020-08-31 NOTE — Progress Notes (Signed)
   Covid-19 Vaccination Clinic  Name:  KAYTLYNN KOCHAN    MRN: [id number] DOB: Oct 15, 1934  08/31/2020  Ms. Waszak was observed post Covid-19 immunization for 15 minutes without incident. She was provided with Vaccine Information Sheet and instruction to access the V-Safe system.   Ms. Mateja was instructed to call 911 with any severe reactions post vaccine: Difficulty breathing  Swelling of face and throat  A fast heartbeat  A bad rash all over body  Dizziness and weakness   Immunizations Administered     Name Date Dose VIS Date Route   Moderna Covid-19 Booster Vaccine 08/31/2020  9:38 AM 0.25 mL 12/23/2019 Intramuscular   Manufacturer: Moderna   Lot: [id number]   [place]: 27253-664-40

## 2020-09-13 ENCOUNTER — Ambulatory Visit
Admission: RE | Admit: 2020-09-13 | Discharge: 2020-09-13 | Disposition: A | Discharge status: Home / Self Care | Payer: Medicare Other | Source: Ambulatory Visit

## 2020-09-13 ENCOUNTER — Other Ambulatory Visit: Payer: Self-pay

## 2020-09-13 DIAGNOSIS — Z1231 Encounter for screening mammogram for malignant neoplasm of breast: Secondary | ICD-10-CM | POA: Diagnosis not present

## 2020-09-28 ENCOUNTER — Other Ambulatory Visit: Payer: Self-pay | Admitting: *Deleted

## 2020-09-28 DIAGNOSIS — G3109 Other frontotemporal dementia: Secondary | ICD-10-CM

## 2020-09-28 DIAGNOSIS — F0281 Dementia in other diseases classified elsewhere with behavioral disturbance: Secondary | ICD-10-CM

## 2020-09-28 DIAGNOSIS — F422 Mixed obsessional thoughts and acts: Secondary | ICD-10-CM

## 2020-09-28 MED ORDER — SERTRALINE HCL 50 MG PO TABS: 50.0000 mg | ORAL_TABLET | Freq: Every day | ORAL | 1 refills | Status: DC

## 2020-09-28 MED ORDER — MEMANTINE HCL 10 MG PO TABS: 10.0000 mg | ORAL_TABLET | Freq: Two times a day (BID) | ORAL | 1 refills | Status: DC

## 2020-09-28 MED ORDER — ATORVASTATIN CALCIUM 20 MG PO TABS: 20.0000 mg | ORAL_TABLET | Freq: Every day | ORAL | 1 refills | Status: DC

## 2020-09-28 NOTE — Telephone Encounter (Signed)
[facility] Requested refills.

## 2020-10-17 ENCOUNTER — Encounter: Payer: Self-pay | Admitting: Internal Medicine

## 2020-10-21 ENCOUNTER — Other Ambulatory Visit: Payer: Self-pay | Admitting: Internal Medicine

## 2020-10-21 DIAGNOSIS — M25561 Pain in right knee: Secondary | ICD-10-CM

## 2020-10-27 NOTE — Telephone Encounter (Signed)
Message forwarded to Referral Coordinator.

## 2020-11-03 DIAGNOSIS — M25512 Pain in left shoulder: Secondary | ICD-10-CM | POA: Diagnosis not present

## 2020-11-03 DIAGNOSIS — M25561 Pain in right knee: Secondary | ICD-10-CM | POA: Diagnosis not present

## 2020-11-05 DIAGNOSIS — M6281 Muscle weakness (generalized): Secondary | ICD-10-CM | POA: Diagnosis not present

## 2020-11-05 DIAGNOSIS — R300 Dysuria: Secondary | ICD-10-CM | POA: Diagnosis not present

## 2020-11-08 ENCOUNTER — Other Ambulatory Visit: Payer: Self-pay | Admitting: Orthopedic Surgery

## 2020-11-08 DIAGNOSIS — N3000 Acute cystitis without hematuria: Secondary | ICD-10-CM

## 2020-11-08 MED ORDER — SULFAMETHOXAZOLE-TRIMETHOPRIM 800-160 MG PO TABS: 1.0000 | ORAL_TABLET | Freq: Two times a day (BID) | ORAL | 0 refills | Status: AC

## 2020-11-21 ENCOUNTER — Other Ambulatory Visit: Payer: Self-pay

## 2020-11-21 ENCOUNTER — Ambulatory Visit (INDEPENDENT_AMBULATORY_CARE_PROVIDER_SITE_OTHER): Payer: Medicare Other | Admitting: *Deleted

## 2020-11-21 ENCOUNTER — Encounter: Payer: Medicare Other | Admitting: Adult Health

## 2020-11-21 DIAGNOSIS — M810 Age-related osteoporosis without current pathological fracture: Secondary | ICD-10-CM

## 2020-11-21 MED ORDER — DENOSUMAB 60 MG/ML ~~LOC~~ SOSY
60.0000 mg | PREFILLED_SYRINGE | Freq: Once | SUBCUTANEOUS | Status: AC
  Administered 2020-11-21: 60 mg via SUBCUTANEOUS

## 2020-11-29 ENCOUNTER — Other Ambulatory Visit (HOSPITAL_BASED_OUTPATIENT_CLINIC_OR_DEPARTMENT_OTHER): Payer: Self-pay

## 2020-11-30 ENCOUNTER — Other Ambulatory Visit (HOSPITAL_BASED_OUTPATIENT_CLINIC_OR_DEPARTMENT_OTHER): Payer: Self-pay

## 2020-11-30 ENCOUNTER — Ambulatory Visit: Payer: Medicare Other | Attending: Internal Medicine

## 2020-11-30 DIAGNOSIS — Z23 Encounter for immunization: Secondary | ICD-10-CM

## 2020-11-30 MED ORDER — INFLUENZA VAC A&B SA ADJ QUAD 0.5 ML IM PRSY
PREFILLED_SYRINGE | INTRAMUSCULAR | 0 refills | Status: AC
  Filled 2020-11-30 (×2): qty 0.5, 1d supply, fill #0

## 2020-11-30 MED ORDER — COVID-19MRNA BIVAL VAC MODERNA 50 MCG/0.5ML IM SUSP
INTRAMUSCULAR | 0 refills | Status: AC
  Filled 2020-11-30: qty 0.5, 1d supply, fill #0

## 2020-11-30 NOTE — Progress Notes (Signed)
   Covid-19 Vaccination Clinic  Name:  Sharon [name]    MRN: [id number] DOB: 07-Apr-1934  11/30/2020  Sharon [name] was observed post Covid-19 immunization for 15 minutes without incident. She was provided with Vaccine Information Sheet and instruction to access the V-Safe system.   Sharon [name] was instructed to call 911 with any severe reactions post vaccine: Difficulty breathing  Swelling of face and throat  A fast heartbeat  A bad rash all over body  Dizziness and weakness

## 2021-01-05 ENCOUNTER — Other Ambulatory Visit: Payer: Self-pay | Admitting: Student

## 2021-01-05 DIAGNOSIS — R030 Elevated blood-pressure reading, without diagnosis of hypertension: Secondary | ICD-10-CM | POA: Diagnosis not present

## 2021-01-05 DIAGNOSIS — Z982 Presence of cerebrospinal fluid drainage device: Secondary | ICD-10-CM

## 2021-01-05 DIAGNOSIS — Z6824 Body mass index (BMI) 24.0-24.9, adult: Secondary | ICD-10-CM | POA: Diagnosis not present

## 2021-01-22 ENCOUNTER — Encounter: Payer: Self-pay | Admitting: Internal Medicine

## 2021-01-23 ENCOUNTER — Other Ambulatory Visit: Payer: Self-pay

## 2021-01-23 ENCOUNTER — Ambulatory Visit (INDEPENDENT_AMBULATORY_CARE_PROVIDER_SITE_OTHER): Payer: Medicare Other | Admitting: Orthopedic Surgery

## 2021-01-23 ENCOUNTER — Encounter: Payer: Self-pay | Admitting: Orthopedic Surgery

## 2021-01-23 VITALS — BP 124/88 | HR 88 | Temp 96.9°F | Resp 16 | Ht 65.0 in | Wt 154.8 lb

## 2021-01-23 DIAGNOSIS — L309 Dermatitis, unspecified: Secondary | ICD-10-CM | POA: Diagnosis not present

## 2021-01-23 MED ORDER — TRIAMCINOLONE ACETONIDE 0.1 % EX CREA: 1.0000 "application " | TOPICAL_CREAM | Freq: Two times a day (BID) | CUTANEOUS | 0 refills | Status: AC

## 2021-01-23 MED ORDER — PREDNISONE 10 MG PO TABS: ORAL_TABLET | ORAL | 0 refills | Status: AC

## 2021-01-23 NOTE — Patient Instructions (Addendum)
Cream- continue to use twice daily until rash clears  Prednisone- please start 01/24/2021- take every morning for 5 days  Recommend using humidifier every night  Stop using icy/ hot

## 2021-01-23 NOTE — Progress Notes (Signed)
Careteam: Patient Care Team: Virgie Dad, MD as PCP - General (Internal Medicine) Dohmeier, Asencion Partridge, MD as Consulting Physician (Neurology)  Seen by: Windell Moulding, AGNP-C  PLACE OF SERVICE:  [place] Directive information    Allergies  Allergen Reactions   Aricept [Donepezil Hcl] Diarrhea    Chief Complaint  Patient presents with   Acute Visit    Patient complains of rash on chest and lower back.     HPI: Patient is a 86 y.o. female seen today for acute visit due to rash.   Caregiver present during encounter.   She has had a rash to her upper chest and lower back for one week. Rash appeared after using icy/hot cream. She reports rash is itchy at times. Denies pain. She has not tried any remedies. History of "herpes" rash 11/2019. Denies use of new detergents/soaps. She has not come in contact with poison ivy or insects.     Review of Systems:  Review of Systems  Unable to perform ROS: Dementia   Past Medical History:  Diagnosis Date   Aneurysm ([place])    Arthritis    osteo   Cystitis 03/02/2014   acute    Dyslipidemia    Hearing loss bil   got hearing aides.    Past Surgical History:  Procedure Laterality Date   ABDOMINAL HYSTERECTOMY     RADIOLOGY WITH ANESTHESIA N/A 03/04/2014   Procedure: RADIOLOGY WITH ANESTHESIA;  Surgeon: Rob Hickman, MD;  Location: [place];  Service: Radiology;  Laterality: N/A;   RADIOLOGY WITH ANESTHESIA N/A 03/04/2014   Procedure: RADIOLOGY WITH ANESTHESIA;  Surgeon: Medication Radiologist, MD;  Location: [place];  Service: Radiology;  Laterality: N/A;   VENTRICULOPERITONEAL SHUNT Right 03/15/2014   Procedure: SHUNT INSERTION VENTRICULAR-PERITONEAL;  Surgeon: Charlie Pitter, MD;  Location: [facility];  Service: Neurosurgery;  Laterality: Right;   Social History:   reports that she quit smoking about 6 years ago. Her smoking use included cigarettes. She smoked an average of .5 packs per day. She has never used  smokeless tobacco. She reports that she does not currently use alcohol. She reports that she does not use drugs.  Family History  Problem Relation Age of Onset   Hypertension Mother    Hypertension Father     Medications: Patient's Medications  New Prescriptions   No medications on file  Previous Medications   ATORVASTATIN (LIPITOR) 20 MG TABLET    Take 1 tablet (20 mg total) by mouth daily.   CALCIUM CARBONATE (CALCIUM 600 PO)    Take 1 tablet by mouth daily.    CAMPHOR-MENTHOL (SARNA) LOTION    Apply 1 application topically as needed for itching.   CHOLECALCIFEROL (VITAMIN D) 50 MCG (2000 UT) CAPS    Take 1 capsule (2,000 Units total) by mouth daily.   COVID-19 MRNA BIVALENT VACCINE, MODERNA, 50 MCG/0.5ML INJECTION    Inject into the muscle.   COVID-19 MRNA VACCINE, MODERNA, 100 MCG/0.5ML INJECTION    Inject into the muscle.   DENOSUMAB (PROLIA) 60 MG/ML SOLN INJECTION    Inject 60 mg into the skin every 6 (six) months. Administer in upper arm, thigh, or abdomen   INFLUENZA VACCINE ADJUVANTED (FLUAD) 0.5 ML INJECTION    Inject into the muscle.   MELATONIN 5 MG CAPS    Take 1 capsule (5 mg total) by mouth at bedtime as needed.   MEMANTINE (NAMENDA) 10 MG TABLET    Take 1 tablet (10 mg  total) by mouth 2 (two) times daily.   SENNA (SENOKOT) 8.6 MG TABS TABLET    Take 1 tablet (8.6 mg total) by mouth daily as needed for mild constipation.   SERTRALINE (ZOLOFT) 50 MG TABLET    Take 1 tablet (50 mg total) by mouth daily.   SULFAMETHOXAZOLE-TRIMETHOPRIM (BACTRIM DS) 800-160 MG TABLET    Take 1 tablet by mouth 2 (two) times daily.   VALACYCLOVIR (VALTREX) 1000 MG TABLET    Take 1 tablet (1,000 mg total) by mouth 3 (three) times daily.  Modified Medications   No medications on file  Discontinued Medications   No medications on file    Physical Exam:  There were no vitals filed for this visit. There is no height or weight on file to calculate BMI. Wt Readings from Last 3 Encounters:   08/17/20 145 lb (65.8 kg)  08/09/20 145 lb (65.8 kg)  06/01/20 139 lb (63 kg)    Physical Exam Vitals reviewed.  Constitutional:      General: She is not in acute distress. HENT:     Head: Normocephalic.  Eyes:     General:        Right eye: No discharge.        Left eye: No discharge.  Cardiovascular:     Rate and Rhythm: Normal rate and regular rhythm.     Pulses: Normal pulses.     Heart sounds: Normal heart sounds. No murmur heard. Pulmonary:     Effort: Pulmonary effort is normal. No respiratory distress.     Breath sounds: Normal breath sounds. No wheezing.  Skin:    General: Skin is warm and dry.     Capillary Refill: Capillary refill takes less than 2 seconds.     Findings: Rash present.     Comments: Maculopapular rash spread over upper chest and lower back, skin appears dry, no drainage or crusts, no sign of infection, scratch marks present.   Neurological:     General: No focal deficit present.     Mental Status: She is alert. Mental status is at baseline.  Psychiatric:        Mood and Affect: Mood normal.        Behavior: Behavior normal.    Labs reviewed: Basic Metabolic Panel: Recent Labs    04/21/20 0000  NA 143  K 4.1  CL 106  CO2 26*  BUN 35*  CREATININE 0.9  CALCIUM 9.1  TSH 2.37   Liver Function Tests: Recent Labs    04/21/20 0000 06/01/20 1446  AST 18  --   ALT 23  --   ALKPHOS 77  --   PROT  --  6.6  ALBUMIN 4.0  --    No results for input(s): LIPASE, AMYLASE in the last 8760 hours. No results for input(s): AMMONIA in the last 8760 hours. CBC: Recent Labs    04/21/20 0000 06/01/20 1446  WBC 7.4 9.0  NEUTROABS  --  6.2  HGB 13.3 14.0  HCT 41 42.4  MCV  --  95  PLT 258 283   Lipid Panel: Recent Labs    04/21/20 0000  CHOL 170  HDL 71*  LDLCALC 74  TRIG 122   TSH: Recent Labs    04/21/20 0000  TSH 2.37   A1C: No results found for: HGBA1C   Assessment/Plan 1. Dermatitis - maculopapular rash to upper back  and lower back - suspect icy/hot as cause - recommend using humidifier at night - apply  lotion or vaseline to skin after bathing - triamcinolone cream (KENALOG) 0.1 %; Apply 1 application topically 2 (two) times daily for 10 days.  Dispense: 30 g; Refill: 0 - predniSONE (DELTASONE) 10 MG tablet; Take 4 tablets (40 mg total) by mouth daily with breakfast for 1 day, THEN 3 tablets (30 mg total) daily with breakfast for 1 day, THEN 2 tablets (20 mg total) daily with breakfast for 1 day, THEN 1 tablet (10 mg total) daily with breakfast for 1 day, THEN 0.5 tablets (5 mg total) daily with breakfast for 1 day.  Dispense: 10.5 tablet; Refill: 0  Total time: 15 minutes. Greater than 50% of total time spent doing patient education on symptom/medication management.   Next appt: none [facility] [place], [facility] [phone]

## 2021-01-31 ENCOUNTER — Ambulatory Visit
Admission: RE | Admit: 2021-01-31 | Discharge: 2021-01-31 | Disposition: A | Discharge status: Home / Self Care | Payer: Medicare Other | Source: Ambulatory Visit | Attending: Student | Admitting: Student

## 2021-01-31 DIAGNOSIS — I671 Cerebral aneurysm, nonruptured: Secondary | ICD-10-CM | POA: Diagnosis not present

## 2021-01-31 DIAGNOSIS — Z982 Presence of cerebrospinal fluid drainage device: Secondary | ICD-10-CM | POA: Diagnosis not present

## 2021-02-01 DIAGNOSIS — G910 Communicating hydrocephalus: Secondary | ICD-10-CM | POA: Diagnosis not present

## 2021-02-06 ENCOUNTER — Encounter (HOSPITAL_BASED_OUTPATIENT_CLINIC_OR_DEPARTMENT_OTHER): Payer: Self-pay | Admitting: Emergency Medicine

## 2021-02-06 ENCOUNTER — Emergency Department (HOSPITAL_BASED_OUTPATIENT_CLINIC_OR_DEPARTMENT_OTHER)
Admission: EM | Admit: 2021-02-06 | Discharge: 2021-02-06 | Disposition: A | Discharge status: Home / Self Care | Payer: Medicare Other | Attending: Emergency Medicine | Admitting: Emergency Medicine

## 2021-02-06 ENCOUNTER — Emergency Department (HOSPITAL_BASED_OUTPATIENT_CLINIC_OR_DEPARTMENT_OTHER): Payer: Medicare Other

## 2021-02-06 ENCOUNTER — Other Ambulatory Visit: Payer: Self-pay

## 2021-02-06 DIAGNOSIS — Z79899 Other long term (current) drug therapy: Secondary | ICD-10-CM | POA: Insufficient documentation

## 2021-02-06 DIAGNOSIS — Z87891 Personal history of nicotine dependence: Secondary | ICD-10-CM | POA: Diagnosis not present

## 2021-02-06 DIAGNOSIS — F03918 Unspecified dementia, unspecified severity, with other behavioral disturbance: Secondary | ICD-10-CM | POA: Insufficient documentation

## 2021-02-06 DIAGNOSIS — U071 COVID-19: Secondary | ICD-10-CM

## 2021-02-06 DIAGNOSIS — R4182 Altered mental status, unspecified: Secondary | ICD-10-CM | POA: Diagnosis not present

## 2021-02-06 DIAGNOSIS — N3000 Acute cystitis without hematuria: Secondary | ICD-10-CM | POA: Diagnosis not present

## 2021-02-06 DIAGNOSIS — R9431 Abnormal electrocardiogram [ECG] [EKG]: Secondary | ICD-10-CM | POA: Diagnosis not present

## 2021-02-06 LAB — URINALYSIS, ROUTINE W REFLEX MICROSCOPIC
Bilirubin Urine: NEGATIVE
Glucose, UA: NEGATIVE mg/dL
Ketones, ur: NEGATIVE mg/dL
Nitrite: POSITIVE — AB
Protein, ur: 30 mg/dL — AB
Specific Gravity, Urine: 1.017 (ref 1.005–1.030)
pH: 7 (ref 5.0–8.0)

## 2021-02-06 LAB — COMPREHENSIVE METABOLIC PANEL
ALT: 14 U/L (ref 0–44)
AST: 12 U/L — ABNORMAL LOW (ref 15–41)
Albumin: 3.8 g/dL (ref 3.5–5.0)
Alkaline Phosphatase: 69 U/L (ref 38–126)
Anion gap: 9 (ref 5–15)
BUN: 24 mg/dL — ABNORMAL HIGH (ref 8–23)
CO2: 30 mmol/L (ref 22–32)
Calcium: 9.8 mg/dL (ref 8.9–10.3)
Chloride: 103 mmol/L (ref 98–111)
Creatinine, Ser: 1.23 mg/dL — ABNORMAL HIGH (ref 0.44–1.00)
GFR, Estimated: 43 mL/min — ABNORMAL LOW (ref >60)
Glucose, Bld: 108 mg/dL — ABNORMAL HIGH (ref 70–99)
Potassium: 4.1 mmol/L (ref 3.5–5.1)
Sodium: 142 mmol/L (ref 135–145)
Total Bilirubin: 0.4 mg/dL (ref 0.3–1.2)
Total Protein: 6.5 g/dL (ref 6.5–8.1)

## 2021-02-06 LAB — CBC WITH DIFFERENTIAL/PLATELET
Abs Immature Granulocytes: 0.08 10*3/uL — ABNORMAL HIGH (ref 0.00–0.07)
Basophils Absolute: 0 10*3/uL (ref 0.0–0.1)
Basophils Relative: 0 %
Eosinophils Absolute: 0.1 10*3/uL (ref 0.0–0.5)
Eosinophils Relative: 1 %
HCT: 40.9 % (ref 36.0–46.0)
Hemoglobin: 13 g/dL (ref 12.0–15.0)
Immature Granulocytes: 1 %
Lymphocytes Relative: 15 %
Lymphs Abs: 1.5 10*3/uL (ref 0.7–4.0)
MCH: 31 pg (ref 26.0–34.0)
MCHC: 31.8 g/dL (ref 30.0–36.0)
MCV: 97.4 fL (ref 80.0–100.0)
Monocytes Absolute: 0.9 10*3/uL (ref 0.1–1.0)
Monocytes Relative: 9 %
Neutro Abs: 7.4 10*3/uL (ref 1.7–7.7)
Neutrophils Relative %: 74 %
Platelets: 273 10*3/uL (ref 150–400)
RBC: 4.2 MIL/uL (ref 3.87–5.11)
RDW: 13.1 % (ref 11.5–15.5)
WBC: 10.1 10*3/uL (ref 4.0–10.5)
nRBC: 0 % (ref 0.0–0.2)

## 2021-02-06 LAB — RESP PANEL BY RT-PCR (FLU A&B, COVID) ARPGX2
Influenza A by PCR: NEGATIVE
Influenza B by PCR: NEGATIVE
SARS Coronavirus 2 by RT PCR: POSITIVE — AB

## 2021-02-06 LAB — TSH: TSH: 1.575 u[IU]/mL (ref 0.350–4.500)

## 2021-02-06 LAB — MAGNESIUM: Magnesium: 2 mg/dL (ref 1.7–2.4)

## 2021-02-06 MED ORDER — SODIUM CHLORIDE 0.9 % IV SOLN
1.0000 g | Freq: Once | INTRAVENOUS | Status: AC
  Administered 2021-02-06: 1 g via INTRAVENOUS
  Filled 2021-02-06: qty 10

## 2021-02-06 MED ORDER — LACTATED RINGERS IV BOLUS
500.0000 mL | Freq: Once | INTRAVENOUS | Status: AC
  Administered 2021-02-06: 500 mL via INTRAVENOUS

## 2021-02-06 MED ORDER — CEPHALEXIN 500 MG PO CAPS: 500.0000 mg | ORAL_CAPSULE | Freq: Three times a day (TID) | ORAL | 0 refills | Status: AC

## 2021-02-06 NOTE — ED Provider Notes (Signed)
[facility] EMERGENCY DEPT Provider Note   CSN: [id number] Arrival date & time: 02/06/21  1045     History Chief Complaint  Patient presents with   Altered Mental Status    Sharon [name] is a 86 y.o. female.   Altered Mental Status Patient is 86 year old female with history of dementia, who presents for increased somnolence over the past week.  History is provided by her caregiver, who accompanies her in the ED.  Patient currently lives in an independent living apartment.  She has a caregiver present throughout the day.  She has had a cognitive decline over the past 7 years.  This has worsened over the past 1 year.  She has been following up with outpatient providers, including neurology and neurosurgery.  She did undergo a CT scan 6 days ago.  She saw her neurosurgeon 5 days ago.  During this visit, patient and caregiver were offered reassurance of her recent worsening symptoms.  However, since that time she has had increased somnolence.  She is typically active throughout the day.  Over the past week, she has been staying in bed longer than normal.  When she rests, she will typically fall asleep.  This is not typical for her.  She had a fall onto her bottom on Tuesday.  She has not had any known falls or injuries to her head since then.  She has been noted to have exertional shortness of breath over the past 2 weeks.      Past Medical History:  Diagnosis Date   Aneurysm ([place])    Arthritis    osteo   Cystitis 03/02/2014   acute    Dyslipidemia    Hearing loss bil   got hearing aides.     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   Abrasion, foot with infection, left, initial encounter 10/13/2019   Smoking 10/13/2019   Depression, major, single episode, mild ([place]) 06/10/2019   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   Communicating hydrocephalus ([place]) 02/19/2017   Urinary urgency 07/31/2016   Senile osteoporosis 07/31/2016   Alcoholism in remission ([place]) 07/31/2016   Tobacco abuse, in remission 07/31/2016   Memory loss 08/16/2014   HLD (hyperlipidemia)    Nontraumatic subarachnoid hemorrhage ([place]) 03/06/2014   Headache 03/02/2014   Dyslipidemia 03/02/2014    Past Surgical History:  Procedure Laterality Date   ABDOMINAL HYSTERECTOMY     RADIOLOGY WITH ANESTHESIA N/A 03/04/2014   Procedure: RADIOLOGY WITH ANESTHESIA;  Surgeon: Rob Hickman, MD;  Location: [facility];  Service: Radiology;  Laterality: N/A;   RADIOLOGY WITH ANESTHESIA N/A 03/04/2014   Procedure: RADIOLOGY WITH ANESTHESIA;  Surgeon: Medication Radiologist, MD;  Location: [facility];  Service: Radiology;  Laterality: N/A;   VENTRICULOPERITONEAL SHUNT Right 03/15/2014   Procedure: SHUNT INSERTION VENTRICULAR-PERITONEAL;  Surgeon: Charlie Pitter, MD;  Location: [facility];  Service: Neurosurgery;  Laterality: Right;     OB History   No obstetric history on file.  Family History  Problem Relation Age of Onset   Hypertension Mother    Hypertension Father     Social History   Tobacco Use   Smoking status: Former    Packs/day: 0.50    Types: Cigarettes    Quit date: 03/05/2014    Years since quitting: 6.9   Smokeless tobacco: Never  Vaping Use   Vaping Use: Never used  Substance Use Topics   Alcohol use: Not Currently    Alcohol/week: 0.0 standard drinks    Comment: recovering alcoholic- started  drinking 2 weeks ago (07-04-14), stated not drinking. 08-16-14   Drug use: No    Home Medications Prior to Admission medications   Medication Sig Start Date End Date Taking? Authorizing Provider  cephALEXin (KEFLEX) 500 MG capsule Take 1 capsule (500 mg total) by mouth 3 (three) times daily for 5 days. 02/06/21 02/11/21 Yes Godfrey Pick, MD  atorvastatin (LIPITOR) 20 MG tablet Take 1 tablet (20 mg total) by mouth daily. 09/28/20   Virgie Dad, MD  Calcium Carbonate (CALCIUM 600 PO) Take 1 tablet by mouth daily.     [provider]  camphor-menthol Timoteo Ace) lotion Apply 1 application topically as needed for itching. 08/17/20   Wardell Honour, MD  Cholecalciferol (VITAMIN D) 50 MCG (2000 UT) CAPS Take 1 capsule (2,000 Units total) by mouth daily. 09/24/18   Reed, Tiffany L, DO  COVID-19 mRNA bivalent vaccine, Moderna, 50 MCG/0.5ML injection Inject into the muscle. 11/30/20   Carlyle Basques, MD  COVID-19 mRNA vaccine, Moderna, 100 MCG/0.5ML injection Inject into the muscle. 08/31/20   Carlyle Basques, MD  denosumab (PROLIA) 60 MG/ML SOLN injection Inject 60 mg into the skin every 6 (six) months. Administer in upper arm, thigh, or abdomen    [provider]  influenza vaccine adjuvanted (FLUAD) 0.5 ML injection Inject into the muscle. 11/30/20   Carlyle Basques, MD  Melatonin 5 MG CAPS Take 1 capsule (5 mg total) by mouth at bedtime as needed. 04/01/18   Reed, Tiffany L, DO  memantine (NAMENDA) 10 MG tablet Take 1 tablet (10 mg total) by mouth 2 (two) times daily. 09/28/20   Virgie Dad, MD  senna (SENOKOT) 8.6 MG TABS tablet Take 1 tablet (8.6 mg total) by mouth daily as needed for mild constipation. 09/24/18   Reed, Tiffany L, DO  sertraline (ZOLOFT) 50 MG tablet Take 1 tablet (50 mg total) by mouth daily. 09/28/20   Virgie Dad, MD  sulfamethoxazole-trimethoprim (BACTRIM DS) 800-160 MG tablet Take 1 tablet by mouth 2 (two) times daily. 11/08/20   Fargo, Amy E, NP  valACYclovir  (VALTREX) 1000 MG tablet Take 1 tablet (1,000 mg total) by mouth 3 (three) times daily. 11/11/19   Reed, Tiffany L, DO    Allergies    Aricept Reather Littler hcl]  Review of Systems   Review of Systems  Unable to perform ROS: Dementia   Physical Exam Updated Vital Signs BP (!) 133/104   Pulse 95   Temp (!) 97.5 F (36.4 C)   Resp 17   Ht 5\' 5"  (1.651 m)   Wt 70.2 kg   SpO2 93%   BMI 25.76 kg/m   Physical Exam Constitutional:      General: She is not in acute distress.    Appearance: Normal appearance. She is normal weight. She is not ill-appearing, toxic-appearing or diaphoretic.  HENT:     Head: Normocephalic and atraumatic.     Right Ear: External ear normal.  Left Ear: External ear normal.     Nose: Nose normal. No congestion or rhinorrhea.     Mouth/Throat:     Mouth: Mucous membranes are moist.     Pharynx: Oropharynx is clear.  Eyes:     General: No scleral icterus.    Extraocular Movements: Extraocular movements intact.     Pupils: Pupils are equal, round, and reactive to light.  Cardiovascular:     Rate and Rhythm: Normal rate and regular rhythm.  Pulmonary:     Breath sounds: No wheezing or rales.  Chest:     Chest wall: No tenderness.  Abdominal:     Tenderness: There is no abdominal tenderness.  Musculoskeletal:        General: No swelling, tenderness or deformity.     Cervical back: Normal range of motion and neck supple. No rigidity or tenderness.     Right lower leg: No edema.     Left lower leg: No edema.  Skin:    General: Skin is warm and dry.     Capillary Refill: Capillary refill takes less than 2 seconds.     Coloration: Skin is not jaundiced or pale.     Comments: Lower back sores  Neurological:     General: No focal deficit present.     Mental Status: She is alert and oriented to person, place, and time.     Cranial Nerves: No cranial nerve deficit, dysarthria or facial asymmetry.     Sensory: Sensation is intact. No sensory deficit.      Motor: Tremor present. No weakness or abnormal muscle tone.     Coordination: Coordination is intact. Coordination normal.  Psychiatric:        Mood and Affect: Mood normal.        Behavior: Behavior normal.    ED Results / Procedures / Treatments   Labs (all labs ordered are listed, but only abnormal results are displayed) Labs Reviewed  RESP PANEL BY RT-PCR (FLU A&B, COVID) ARPGX2 - Abnormal; Notable for the following components:      Result Value   SARS Coronavirus 2 by RT PCR POSITIVE (*)    All other components within normal limits  COMPREHENSIVE METABOLIC PANEL - Abnormal; Notable for the following components:   Glucose, Bld 108 (*)    BUN 24 (*)    Creatinine, Ser 1.23 (*)    AST 12 (*)    GFR, Estimated 43 (*)    All other components within normal limits  CBC WITH DIFFERENTIAL/PLATELET - Abnormal; Notable for the following components:   Abs Immature Granulocytes 0.08 (*)    All other components within normal limits  URINALYSIS, ROUTINE W REFLEX MICROSCOPIC - Abnormal; Notable for the following components:   APPearance HAZY (*)    Hgb urine dipstick SMALL (*)    Protein, ur 30 (*)    Nitrite POSITIVE (*)    Leukocytes,Ua LARGE (*)    Bacteria, UA MANY (*)    Non Squamous Epithelial 0-5 (*)    All other components within normal limits  MAGNESIUM  TSH    EKG EKG Interpretation  Date/Time:  Monday February 06 2021 11:19:48 EST Ventricular Rate:  86 PR Interval:  182 QRS Duration: 86 QT Interval:  349 QTC Calculation: 418 R Axis:   85 Text Interpretation: Sinus rhythm Borderline right axis deviation Confirmed by Godfrey Pick ([id number]) on 02/06/2021 11:48:08 AM  Radiology CT Head Wo Contrast  Result Date: 02/06/2021 CLINICAL DATA:  Altered mental status  EXAM: CT HEAD WITHOUT CONTRAST TECHNIQUE: Contiguous axial images were obtained from the base of the skull through the vertex without intravenous contrast. COMPARISON:  01/31/2021 FINDINGS: Brain: There are no signs  of recent bleeding within the cranium. There is no shift of midline structures. Cortical sulci are prominent. There is prominence CSF space in the periphery of both cerebral hemispheres measuring up to 6 mm. In the image 17 of series 2, there is small round calcific density in the left frontoparietal dura with no significant interval change. There is dense metallic structure in the right supraclinoid region possibly suggesting previous vascular intervention. There is a ventriculostomy catheter entering the right parietal calvarium with its tip lying to the left of third ventricle. Vascular: Unremarkable. Skull: Unremarkable. Sinuses/Orbits: Unremarkable. Other: None. IMPRESSION: No acute intracranial findings are seen. There is prominence of cortical sulci and low-attenuation in the periphery of both cerebral hemispheres, possibly suggesting cortical atrophy or bilateral small subdural hygromas measuring up to 6 mm in maximum thickness. There is no focal mass effect. Other findings as described in the body of the report. Other findings as described in the body of the report. Electronically Signed   By: Elmer Picker M.D.   On: 02/06/2021 12:28    Procedures Procedures   Medications Ordered in ED Medications  lactated ringers bolus 500 mL ( Intravenous Stopped 02/06/21 1504)  cefTRIAXone (ROCEPHIN) 1 g in sodium chloride 0.9 % 100 mL IVPB (0 g Intravenous Stopped 02/06/21 1550)    ED Course  I have reviewed the triage vital signs and the nursing notes.  Pertinent labs & imaging results that were available during my care of the patient were reviewed by me and considered in my medical decision making (see chart for details).    MDM Rules/Calculators/A&P                          Patient is 86 year old female with history of dementia, who presents for acute on chronic confusion and somnolence.  On arrival in the ED, she is afebrile.  Vital signs are normal.  Patient, herself, is able to answer  simple questions and denies any current discomfort.  Her caregiver, who comities her in the ED does provide history and does state concern over her recent confusion and somnolence.  Diagnostic work-up was initiated.  Results showed COVID-19 positivity, small increase in creatinine, as well as an acute UTI.  Patient was given IV fluids and a dose of ceftriaxone in the ED.  Patient's caregiver was offered Paxlovid.  However, given that she has had symptoms over the past week, it is unclear of how recent this COVID-19 infection is.  Given that she may be 7 days or more into her viral infection, patient's caregiver declined Paxlovid, and I feel that this is reasonable.  She was provided a prescription for antibiotics for continued treatment of UTI.  Patient was discharged in stable condition.  Final Clinical Impression(s) / ED Diagnoses Final diagnoses:  Acute cystitis without hematuria  COVID-19    Rx / DC Orders ED Discharge Orders          Ordered    cephALEXin (KEFLEX) 500 MG capsule  3 times daily        02/06/21 1513             Godfrey Pick, MD 02/07/21 [phone]

## 2021-02-06 NOTE — ED Triage Notes (Signed)
Pt arrives to ED with c/o confusion. Pt family reports that pt has baseline confusion but it has worsened over the past week. Family states that over this past week pt has been waking up 3-4 hours later than her normal.

## 2021-02-06 NOTE — Discharge Instructions (Signed)
A prescription for an antibiotic to treat UTI was sent to your pharmacy of choice.  Start taking this, as prescribed, tomorrow.  Please return to the emergency department for any new or worsening symptoms.

## 2021-02-06 NOTE — ED Notes (Signed)
Patient transported to CT 

## 2021-02-15 ENCOUNTER — Other Ambulatory Visit: Payer: Self-pay | Admitting: Internal Medicine

## 2021-02-15 DIAGNOSIS — F422 Mixed obsessional thoughts and acts: Secondary | ICD-10-CM

## 2021-02-15 DIAGNOSIS — F02818 Dementia in other diseases classified elsewhere, unspecified severity, with other behavioral disturbance: Secondary | ICD-10-CM

## 2021-04-03 ENCOUNTER — Other Ambulatory Visit: Payer: Self-pay | Admitting: Internal Medicine

## 2021-04-03 DIAGNOSIS — F422 Mixed obsessional thoughts and acts: Secondary | ICD-10-CM

## 2021-04-03 DIAGNOSIS — G3109 Other frontotemporal dementia: Secondary | ICD-10-CM

## 2021-05-23 ENCOUNTER — Ambulatory Visit: Payer: Medicare Other

## 2021-11-19 ENCOUNTER — Other Ambulatory Visit: Payer: Self-pay | Admitting: Internal Medicine

## 2022-02-02 ENCOUNTER — Other Ambulatory Visit: Payer: Self-pay | Admitting: Internal Medicine

## 2022-02-02 DIAGNOSIS — G3109 Other frontotemporal dementia: Secondary | ICD-10-CM

## 2022-11-04 DEATH — deceased

## 2022-12-23 IMAGING — CT CT HEAD W/O CM
1 series · 15 of 30 positions shown, 19 images · non-contrast
Comparison: 03/24/2015.

CLINICAL DATA: Hydrocephalus. Shunted. Worsening gait ataxia and
progressive dementia.

EXAM:
CT HEAD WITHOUT CONTRAST
TECHNIQUE: Contiguous axial images were obtained from the base of the skull
through the vertex without intravenous contrast.

[Series 2: head w/(date) · axial · 0.44mm/px · z∈[+783,+933]mm · 15 of 34 slices shown, 19 images]
[im 2/34  brain]
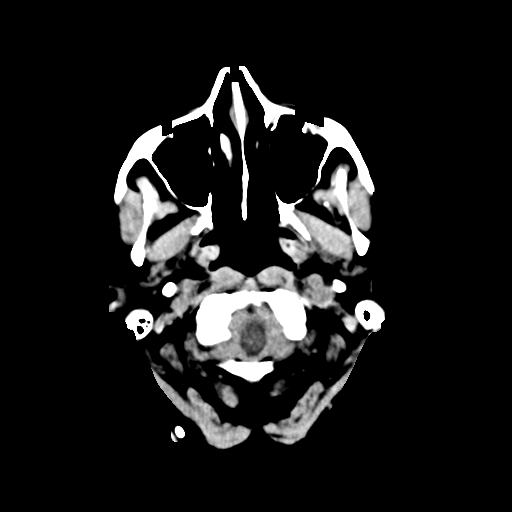
[im 2/34  bone]
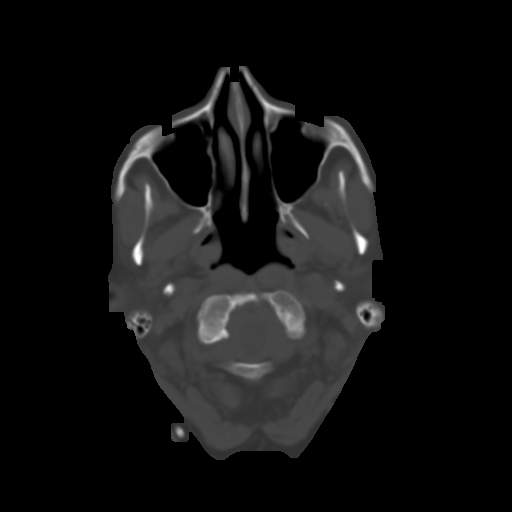
[im 4/34  brain]
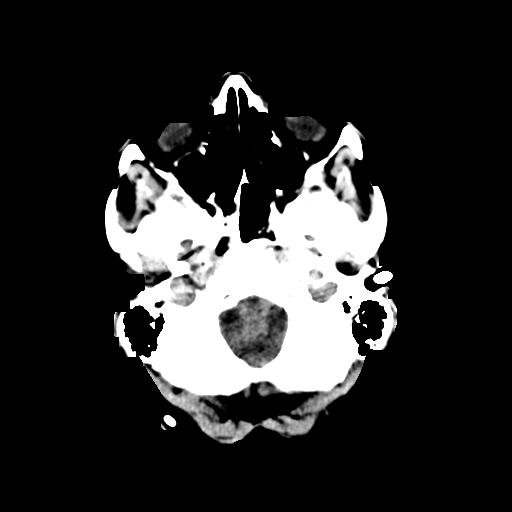
[im 6/34  brain]
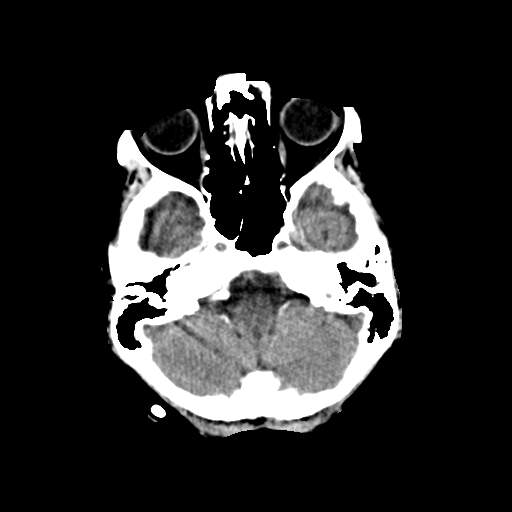
[im 8/34  brain]
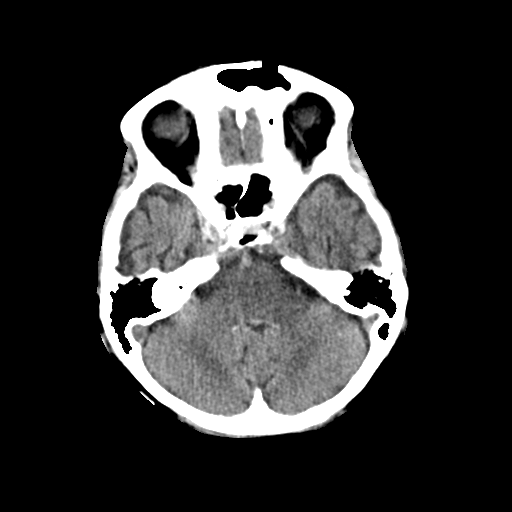
[im 11/34  brain]
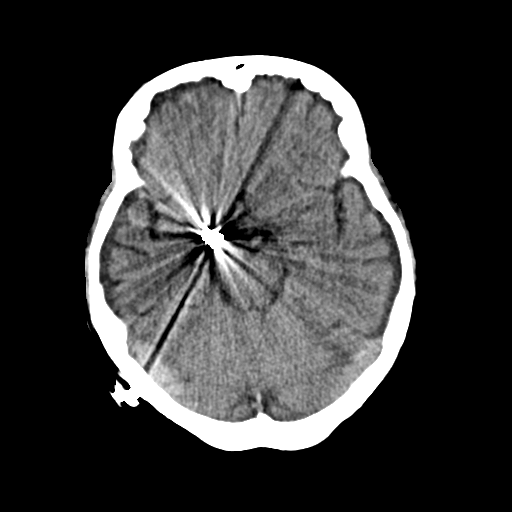
[im 11/34  bone]
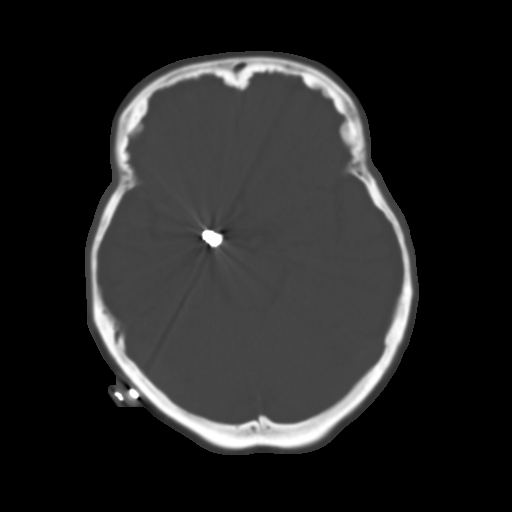
[im 13/34  brain]
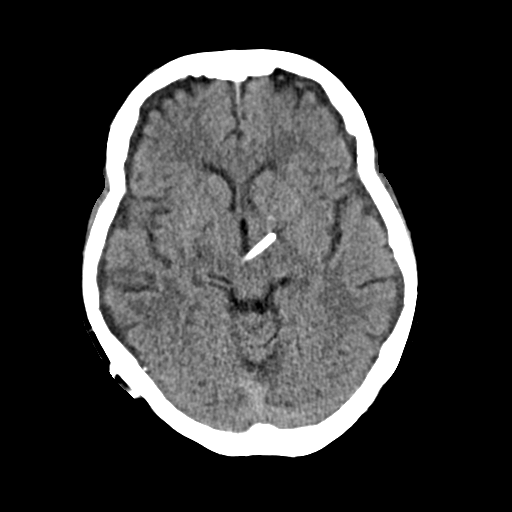
[im 15/34  brain]
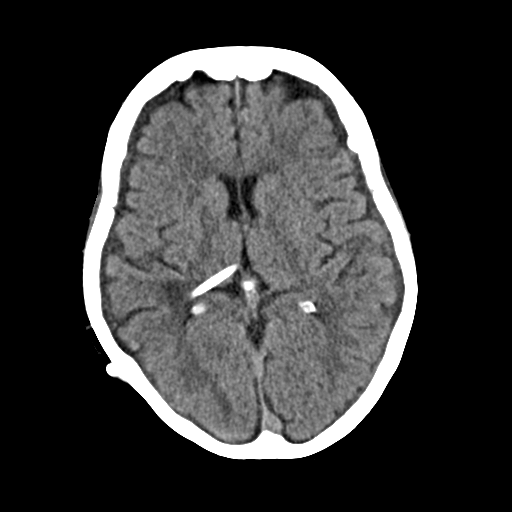
[im 18/34  brain]
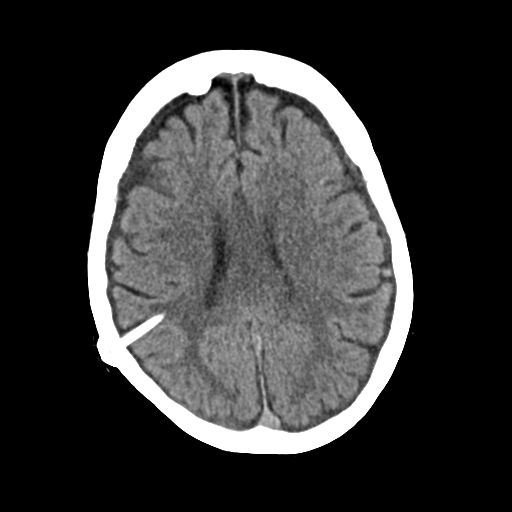
[im 19/34  brain]
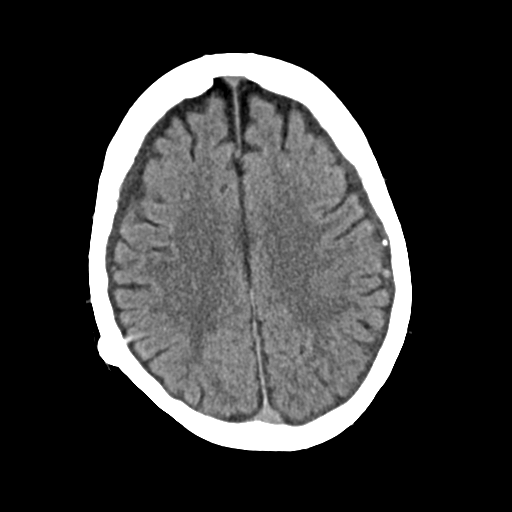
[im 19/34  bone]
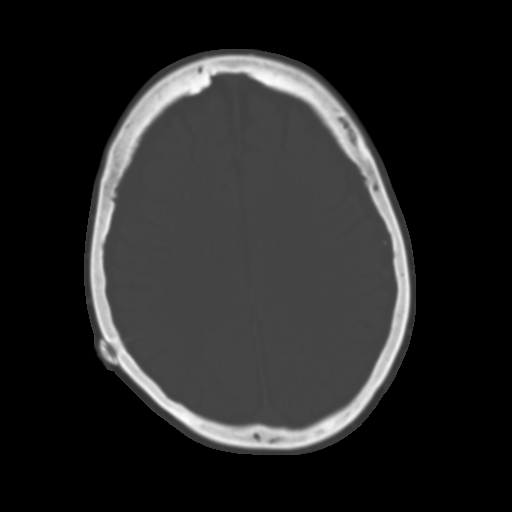
[im 21/34  brain]
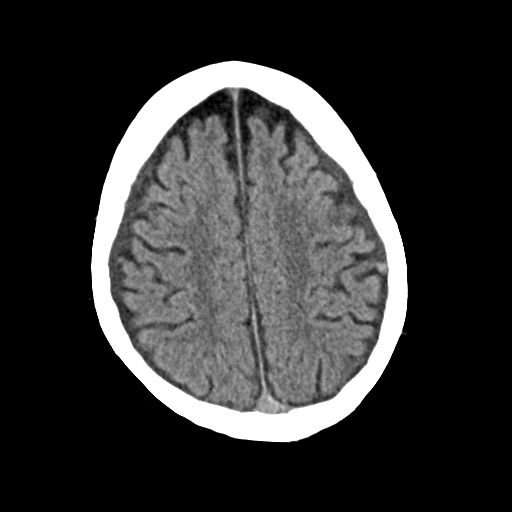
[im 23/34  brain]
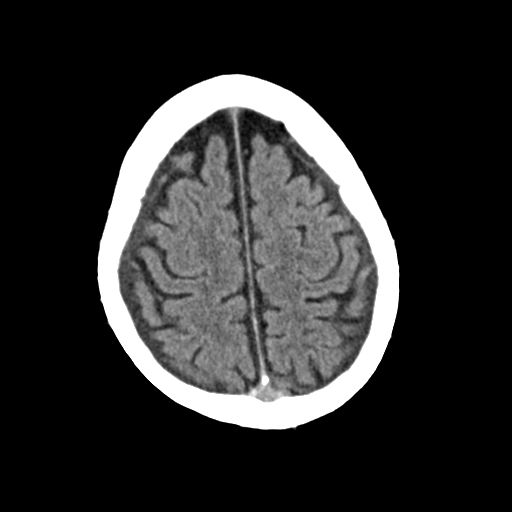
[im 26/34  brain]
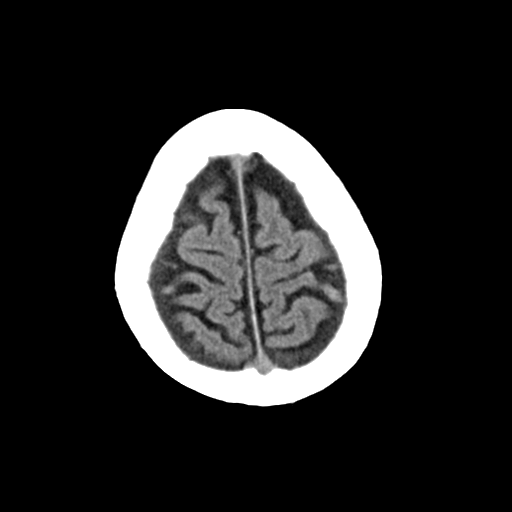
[im 28/34  brain]
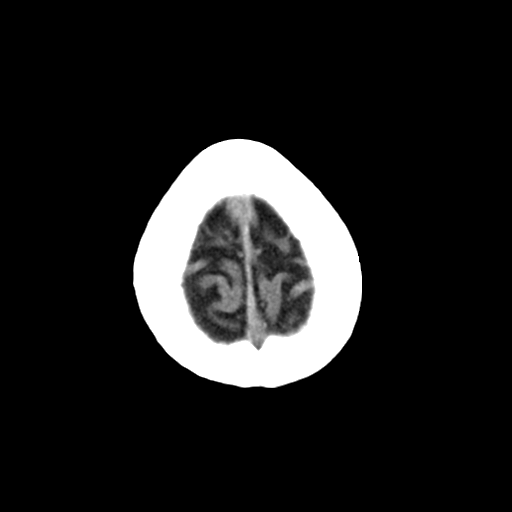
[im 28/34  bone]
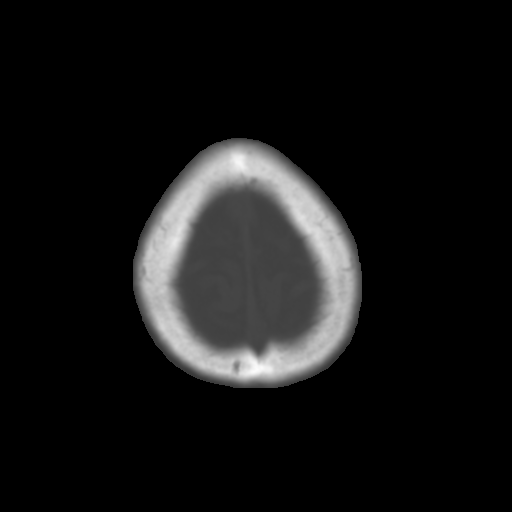
[im 30/34  brain]
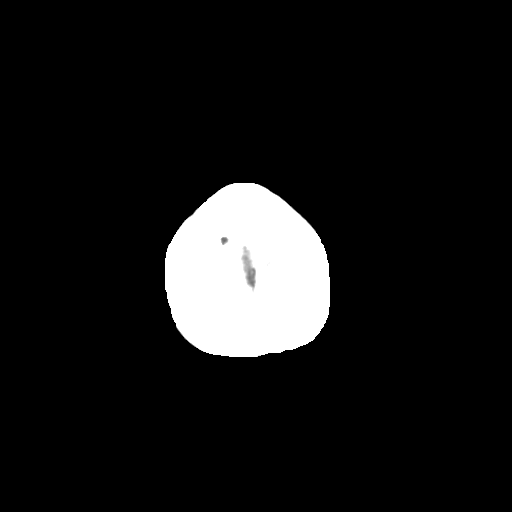
[im 32/34  brain]
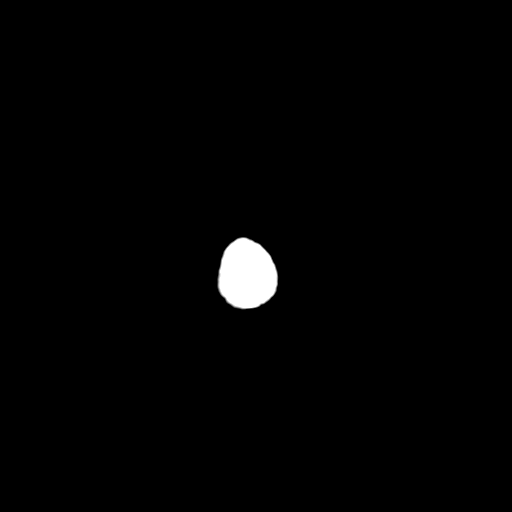

[15 of 30 positions shown; findings below may reference images not displayed]

FINDINGS: Brain: Similar positioning of a right ventriculoperitoneal shunt
catheter which terminates at the genu of the left internal capsule.
Similar mild hypodensity along the catheter tract in the periatrial
region. Visualized portions of the catheter appear intact. The
ventricles are slightly increased in size in comparison to the
prior, but rema[REDACTED]ompressed without specific evidence of
hydrocephalus. Interval increase in mildly hyperdense bilateral
subdural collections which measure up to approximately 6 mm
bilaterally. No substantial mass effect or midline shift. No
evidence of acute large vascular territory infarct. Redemonstrated
right-sided aneurysm coil with associated streak artifact. Within
the limitation of streak artifact. No evidence of acute hemorrhage,
acute large vascular territory infarct or mass lesion.

Vascular: Calcific atherosclerosis. Right-sided posterior
communicating artery region aneurysm coil with associated streak
artifact

Skull: Right burr hole.  No acute fracture.

Sinuses/Orbits: Visualized sinuses are clear.  Unremarkable orbits.

Other: No mastoid effusion. External devices in bilateral external
auditory canals.
IMPRESSION: 1. The ventricles are slightly increased in size in comparison to
the prior, but rema[REDACTED]ompressed without specific evidence of
hydrocephalus.
2. Slight interval increase in mildly hyperdense bilateral subdural
collections which measure up to approximately 6 mm in thickness
bilaterally. These may represent chronic subdural hemorrhages versus
complex hygromas. No substantial mass effect or midline shift.
Recommend correlation with evidence of over shunting.
3. Right posterior communicating artery region aneurysm coil.

These results will be called to the ordering clinician or
representative by the Radiologist Assistant, and communication
documented in the PACS or [REDACTED].
